# Patient Record
Sex: Female | Born: 1940
Health system: Southern US, Community
[De-identification: ages and names within clinical notes are randomized; demographics above are authoritative.]

## PROBLEM LIST (undated history)

## (undated) DIAGNOSIS — S43006A Unspecified dislocation of unspecified shoulder joint, initial encounter: Secondary | ICD-10-CM

## (undated) DIAGNOSIS — I10 Essential (primary) hypertension: Secondary | ICD-10-CM

## (undated) DIAGNOSIS — M1990 Unspecified osteoarthritis, unspecified site: Secondary | ICD-10-CM

## (undated) DIAGNOSIS — R42 Dizziness and giddiness: Secondary | ICD-10-CM

## (undated) DIAGNOSIS — J189 Pneumonia, unspecified organism: Secondary | ICD-10-CM

## (undated) DIAGNOSIS — N289 Disorder of kidney and ureter, unspecified: Secondary | ICD-10-CM

## (undated) DIAGNOSIS — N200 Calculus of kidney: Secondary | ICD-10-CM

## (undated) DIAGNOSIS — M549 Dorsalgia, unspecified: Secondary | ICD-10-CM

## (undated) DIAGNOSIS — J449 Chronic obstructive pulmonary disease, unspecified: Secondary | ICD-10-CM

## (undated) DIAGNOSIS — I4891 Unspecified atrial fibrillation: Secondary | ICD-10-CM

## (undated) DIAGNOSIS — K862 Cyst of pancreas: Secondary | ICD-10-CM

## (undated) DIAGNOSIS — E119 Type 2 diabetes mellitus without complications: Secondary | ICD-10-CM

## (undated) HISTORY — PX: BREAST SURGERY: SHX581

## (undated) HISTORY — PX: ABDOMINAL HYSTERECTOMY: SHX81

## (undated) HISTORY — DX: Calculus of kidney: N20.0

## (undated) HISTORY — PX: FOOT SURGERY: SHX648

## (undated) HISTORY — PX: BLADDER SURGERY: SHX569

---

## 2007-09-16 ENCOUNTER — Ambulatory Visit (HOSPITAL_COMMUNITY): Admission: RE | Admit: 2007-09-16 | Discharge: 2007-09-16 | Payer: Self-pay | Admitting: Internal Medicine

## 2007-09-26 ENCOUNTER — Ambulatory Visit (HOSPITAL_COMMUNITY): Admission: RE | Admit: 2007-09-26 | Discharge: 2007-09-26 | Payer: Self-pay | Admitting: Internal Medicine

## 2007-10-30 ENCOUNTER — Ambulatory Visit (HOSPITAL_COMMUNITY): Admission: RE | Admit: 2007-10-30 | Discharge: 2007-10-30 | Payer: Self-pay | Admitting: Nephrology

## 2007-12-25 ENCOUNTER — Ambulatory Visit (HOSPITAL_COMMUNITY): Admission: RE | Admit: 2007-12-25 | Discharge: 2007-12-25 | Payer: Self-pay | Admitting: Family Medicine

## 2007-12-30 ENCOUNTER — Ambulatory Visit (HOSPITAL_COMMUNITY): Admission: RE | Admit: 2007-12-30 | Discharge: 2007-12-30 | Payer: Self-pay | Admitting: Family Medicine

## 2009-05-31 ENCOUNTER — Ambulatory Visit (HOSPITAL_COMMUNITY): Admission: RE | Admit: 2009-05-31 | Discharge: 2009-05-31 | Payer: Self-pay | Admitting: Family Medicine

## 2010-11-30 ENCOUNTER — Ambulatory Visit (INDEPENDENT_AMBULATORY_CARE_PROVIDER_SITE_OTHER): Payer: Medicare Other | Admitting: Internal Medicine

## 2010-11-30 DIAGNOSIS — R197 Diarrhea, unspecified: Secondary | ICD-10-CM

## 2011-06-19 ENCOUNTER — Other Ambulatory Visit (HOSPITAL_COMMUNITY): Payer: Self-pay | Admitting: Internal Medicine

## 2011-06-19 DIAGNOSIS — Z139 Encounter for screening, unspecified: Secondary | ICD-10-CM

## 2011-06-22 ENCOUNTER — Other Ambulatory Visit (HOSPITAL_COMMUNITY): Payer: Medicare Other

## 2011-06-27 ENCOUNTER — Other Ambulatory Visit (HOSPITAL_COMMUNITY): Payer: Medicare Other

## 2011-07-07 ENCOUNTER — Ambulatory Visit (HOSPITAL_COMMUNITY)
Admission: RE | Admit: 2011-07-07 | Discharge: 2011-07-07 | Disposition: A | Discharge status: Home / Self Care | Payer: Medicare Other | Source: Ambulatory Visit | Attending: Internal Medicine | Admitting: Internal Medicine

## 2011-07-07 DIAGNOSIS — Z139 Encounter for screening, unspecified: Secondary | ICD-10-CM

## 2011-07-07 DIAGNOSIS — M899 Disorder of bone, unspecified: Secondary | ICD-10-CM | POA: Insufficient documentation

## 2011-07-07 DIAGNOSIS — M949 Disorder of cartilage, unspecified: Secondary | ICD-10-CM | POA: Insufficient documentation

## 2011-07-07 DIAGNOSIS — Z780 Asymptomatic menopausal state: Secondary | ICD-10-CM | POA: Insufficient documentation

## 2011-07-07 DIAGNOSIS — Z13820 Encounter for screening for osteoporosis: Secondary | ICD-10-CM | POA: Insufficient documentation

## 2011-08-28 ENCOUNTER — Emergency Department (HOSPITAL_COMMUNITY): Payer: Medicare Other

## 2011-08-28 ENCOUNTER — Emergency Department (HOSPITAL_COMMUNITY)
Admission: EM | Admit: 2011-08-28 | Discharge: 2011-08-28 | Disposition: A | Discharge status: Home / Self Care | Payer: Medicare Other | Attending: Emergency Medicine | Admitting: Emergency Medicine

## 2011-08-28 DIAGNOSIS — N200 Calculus of kidney: Secondary | ICD-10-CM | POA: Insufficient documentation

## 2011-08-28 DIAGNOSIS — J45909 Unspecified asthma, uncomplicated: Secondary | ICD-10-CM | POA: Insufficient documentation

## 2011-08-28 DIAGNOSIS — D72829 Elevated white blood cell count, unspecified: Secondary | ICD-10-CM | POA: Insufficient documentation

## 2011-08-28 DIAGNOSIS — M109 Gout, unspecified: Secondary | ICD-10-CM | POA: Insufficient documentation

## 2011-08-28 DIAGNOSIS — M549 Dorsalgia, unspecified: Secondary | ICD-10-CM

## 2011-08-28 DIAGNOSIS — R110 Nausea: Secondary | ICD-10-CM | POA: Insufficient documentation

## 2011-08-28 DIAGNOSIS — N289 Disorder of kidney and ureter, unspecified: Secondary | ICD-10-CM | POA: Insufficient documentation

## 2011-08-28 DIAGNOSIS — I10 Essential (primary) hypertension: Secondary | ICD-10-CM | POA: Insufficient documentation

## 2011-08-28 DIAGNOSIS — Z9079 Acquired absence of other genital organ(s): Secondary | ICD-10-CM | POA: Insufficient documentation

## 2011-08-28 DIAGNOSIS — M545 Low back pain, unspecified: Secondary | ICD-10-CM | POA: Insufficient documentation

## 2011-08-28 DIAGNOSIS — E119 Type 2 diabetes mellitus without complications: Secondary | ICD-10-CM | POA: Insufficient documentation

## 2011-08-28 DIAGNOSIS — R109 Unspecified abdominal pain: Secondary | ICD-10-CM | POA: Insufficient documentation

## 2011-08-28 HISTORY — DX: Essential (primary) hypertension: I10

## 2011-08-28 HISTORY — DX: Disorder of kidney and ureter, unspecified: N28.9

## 2011-08-28 LAB — DIFFERENTIAL
Basophils Absolute: 0 K/uL (ref 0.0–0.1)
Basophils Relative: 0 % (ref 0–1)
Eosinophils Absolute: 0.3 10*3/uL (ref 0.0–0.7)
Eosinophils Relative: 3 % (ref 0–5)
Lymphocytes Relative: 25 % (ref 12–46)
Lymphs Abs: 2.4 K/uL (ref 0.7–4.0)
Monocytes Absolute: 0.9 K/uL (ref 0.1–1.0)
Monocytes Relative: 9 % (ref 3–12)
Neutro Abs: 6.4 10*3/uL (ref 1.7–7.7)
Neutrophils Relative %: 63 % (ref 43–77)

## 2011-08-28 LAB — URINALYSIS, ROUTINE W REFLEX MICROSCOPIC
Bilirubin Urine: NEGATIVE
Hgb urine dipstick: NEGATIVE
Nitrite: NEGATIVE
Protein, ur: NEGATIVE mg/dL
Specific Gravity, Urine: 1.02 (ref 1.005–1.030)
Urobilinogen, UA: 0.2 mg/dL (ref 0.0–1.0)

## 2011-08-28 LAB — COMPREHENSIVE METABOLIC PANEL
AST: 20 U/L (ref 0–37)
CO2: 29 mEq/L (ref 19–32)
Chloride: 102 mEq/L (ref 96–112)
GFR calc non Af Amer: 37 mL/min — ABNORMAL LOW (ref >90)
Glucose, Bld: 74 mg/dL (ref 70–99)
Sodium: 140 mEq/L (ref 135–145)
Total Protein: 7.3 g/dL (ref 6.0–8.3)

## 2011-08-28 LAB — CBC
HCT: 37.4 % (ref 36.0–46.0)
Hemoglobin: 12.4 g/dL (ref 12.0–15.0)
MCH: 32.2 pg (ref 26.0–34.0)
MCHC: 33.2 g/dL (ref 30.0–36.0)
MCV: 97.1 fL (ref 78.0–100.0)
Platelets: 242 10*3/uL (ref 150–400)
RBC: 3.85 MIL/uL — ABNORMAL LOW (ref 3.87–5.11)
RDW: 14.2 % (ref 11.5–15.5)
WBC: 10 K/uL (ref 4.0–10.5)

## 2011-08-28 LAB — COMPREHENSIVE METABOLIC PANEL WITH GFR
ALT: 13 U/L (ref 0–35)
Albumin: 4 g/dL (ref 3.5–5.2)
Alkaline Phosphatase: 77 U/L (ref 39–117)
BUN: 23 mg/dL (ref 6–23)
Calcium: 10.4 mg/dL (ref 8.4–10.5)
Creatinine, Ser: 1.4 mg/dL — ABNORMAL HIGH (ref 0.50–1.10)
GFR calc Af Amer: 42 mL/min — ABNORMAL LOW (ref >90)
Potassium: 4.2 meq/L (ref 3.5–5.1)
Total Bilirubin: 0.5 mg/dL (ref 0.3–1.2)

## 2011-08-28 LAB — LIPASE, BLOOD: Lipase: 35 U/L (ref 11–59)

## 2011-08-28 MED ORDER — FENTANYL CITRATE 0.05 MG/ML IJ SOLN
25.0000 ug | Freq: Once | INTRAMUSCULAR | Status: AC
  Administered 2011-08-28: 25 ug via INTRAVENOUS
  Filled 2011-08-28: qty 2

## 2011-08-28 MED ORDER — ONDANSETRON HCL 4 MG/2ML IJ SOLN
4.0000 mg | Freq: Once | INTRAMUSCULAR | Status: AC
  Administered 2011-08-28: 4 mg via INTRAVENOUS
  Filled 2011-08-28: qty 2

## 2011-08-28 MED ORDER — ONDANSETRON HCL 4 MG/2ML IJ SOLN: 4.0000 mg | Freq: Once | INTRAMUSCULAR | Status: DC

## 2011-08-28 MED ORDER — ONDANSETRON HCL 4 MG/2ML IJ SOLN
INTRAMUSCULAR | Status: AC
  Filled 2011-08-28: qty 2

## 2011-08-28 MED ORDER — SODIUM CHLORIDE 0.9 % IV SOLN
Freq: Once | INTRAVENOUS | Status: AC
  Administered 2011-08-28: 125 mL via INTRAVENOUS

## 2011-08-28 NOTE — Discharge Instructions (Signed)
Back Pain, Adult Low back pain is very common. About 1 in 5 people have back pain.The cause of low back pain is rarely dangerous. The pain often gets better over time.About half of people with a sudden onset of back pain feel better in just 2 weeks. About 8 in 10 people feel better by 6 weeks.  CAUSES Some common causes of back pain include:  Strain of the muscles or ligaments supporting the spine.   Wear and tear (degeneration) of the spinal discs.   Arthritis.   Direct injury to the back.  DIAGNOSIS Most of the time, the direct cause of low back pain is not known.However, back pain can be treated effectively even when the exact cause of the pain is unknown.Answering your caregiver's questions about your overall health and symptoms is one of the most accurate ways to make sure the cause of your pain is not dangerous. If your caregiver needs more information, he or she may order lab work or imaging tests (X-rays or MRIs).However, even if imaging tests show changes in your back, this usually does not require surgery. HOME CARE INSTRUCTIONS For many people, back pain returns.Since low back pain is rarely dangerous, it is often a condition that people can learn to manageon their own.   Remain active. It is stressful on the back to sit or stand in one place. Do not sit, drive, or stand in one place for more than 30 minutes at a time. Take short walks on level surfaces as soon as pain allows.Try to increase the length of time you walk each day.   Do not stay in bed.Resting more than 1 or 2 days can delay your recovery.   Do not avoid exercise or work.Your body is made to move.It is not dangerous to be active, even though your back may hurt.Your back will likely heal faster if you return to being active before your pain is gone.   Pay attention to your body when you bend and lift. Many people have less discomfortwhen lifting if they bend their knees, keep the load close to their  bodies,and avoid twisting. Often, the most comfortable positions are those that put less stress on your recovering back.   Find a comfortable position to sleep. Use a firm mattress and lie on your side with your knees slightly bent. If you lie on your back, put a pillow under your knees.   Only take over-the-counter or prescription medicines as directed by your caregiver. Over-the-counter medicines to reduce pain and inflammation are often the most helpful.Your caregiver may prescribe muscle relaxant drugs.These medicines help dull your pain so you can more quickly return to your normal activities and healthy exercise.   Put ice on the injured area.   Put ice in a plastic bag.   Place a towel between your skin and the bag.   Leave the ice on for 15 to 20 minutes, 3 to 4 times a day for the first 2 to 3 days. After that, ice and heat may be alternated to reduce pain and spasms.   Ask your caregiver about trying back exercises and gentle massage. This may be of some benefit.   Avoid feeling anxious or stressed.Stress increases muscle tension and can worsen back pain.It is important to recognize when you are anxious or stressed and learn ways to manage it.Exercise is a great option.  SEEK MEDICAL CARE IF:  You have pain that is not relieved with rest or medicine.   You have   pain that does not improve in 1 week.   You have new symptoms.   You are generally not feeling well.  SEEK IMMEDIATE MEDICAL CARE IF:   You have pain that radiates from your back into your legs.   You develop new bowel or bladder control problems.   You have unusual weakness or numbness in your arms or legs.   You develop nausea or vomiting.   You develop abdominal pain.   You feel faint.  Document Released: 08/14/2005 Document Revised: 04/26/2011 Document Reviewed: 01/02/2011 [employer] Patient Information 2012 [employer]. 

## 2011-08-28 NOTE — ED Provider Notes (Signed)
History     CSN: [id number]  Arrival date & time 08/28/11  1228   First MD Initiated Contact with Patient 08/28/11 1505      Chief Complaint  Patient presents with  . Flank Pain    (Consider location/radiation/quality/duration/timing/severity/associated sxs/prior treatment) HPI Comments: Patient reports that she's had some intermittent right flank and low back pain intermittently for approximately one to 2 months. Patient reports that she had an exacerbation of this pain starting yesterday. She does not recall injuring her back, following, picking up anything heavy. She denies any nausea or vomiting. She reports she's had kidney infections as well as kidney stones in the past but this pain is somewhat different. With her kidney stone she had nausea and vomiting and she does not now. With kidney infections, she often had frequency as well as dysuria and she also does not have any of those symptoms. She reports sometimes moving or bending and to certain positions exacerbates her pain. She denies cough and reports no pleuritic pain. She denies fever or chills also denies any rash or itching in that area. She denies numbness or weakness going down her right leg. Denies difficulty with urination or defecation.  Patient is a 70 y.o. female presenting with flank pain. The history is provided by the patient and a relative.  Flank Pain Pertinent negatives include no chest pain and no shortness of breath.    Past Medical History  Diagnosis Date  . Renal disorder   . Gout   . Hypertension   . Asthma   . Diabetes mellitus     Past Surgical History  Procedure Date  . Breast surgery   . Abdominal hysterectomy   . Bladder surgery   . Foot surgery     No family history on file.  History  Substance Use Topics  . Smoking status: Never Smoker   . Smokeless tobacco: Not on file  . Alcohol Use: No    OB History    Grav Para Term Preterm Abortions TAB SAB Ect Mult Living                    Review of Systems  Respiratory: Negative for cough and shortness of breath.   Cardiovascular: Negative for chest pain.  Gastrointestinal: Negative for nausea, vomiting, diarrhea and constipation.  Genitourinary: Positive for flank pain. Negative for dysuria, urgency and hematuria.  Musculoskeletal: Positive for back pain.  Skin: Negative for rash.  Neurological: Negative for weakness and numbness.  All other systems reviewed and are negative.    Allergies  Codeine  Home Medications   Current Outpatient Rx  Name Route Sig Dispense Refill  . ACETAMINOPHEN 500 MG PO TABS Oral Take 500 mg by mouth as needed. For pain     . ALBUTEROL SULFATE HFA 108 (90 BASE) MCG/ACT IN AERS Inhalation Inhale 2 puffs into the lungs daily as needed. For rescue     . ALENDRONATE SODIUM 70 MG PO TABS Oral Take 70 mg by mouth every 7 (seven) days. Take with a full glass of water on an empty stomach on Wednesdays     . ALLOPURINOL 100 MG PO TABS Oral Take 200 mg by mouth every morning.      Marland Kitchen AMLODIPINE BESYLATE 10 MG PO TABS Oral Take 5 mg by mouth every morning.      . BUDESONIDE-FORMOTEROL FUMARATE 80-4.5 MCG/ACT IN AERO Inhalation Inhale 2 puffs into the lungs 2 (two) times daily.      Marland Kitchen  CALCIUM 600 + D PO Oral Take 1 tablet by mouth daily.      Marland Kitchen VITAMIN D 2000 UNITS PO CAPS Oral Take 1 capsule by mouth every morning.      Marland Kitchen GLIMEPIRIDE 2 MG PO TABS Oral Take 2 mg by mouth every morning.      Marland Kitchen LOPERAMIDE HCL 2 MG PO CAPS Oral Take 2 mg by mouth every morning.      Marland Kitchen METOPROLOL TARTRATE 50 MG PO TABS Oral Take 50 mg by mouth daily.      Marland Kitchen SIMVASTATIN 10 MG PO TABS Oral Take 10 mg by mouth at bedtime.        BP 148/77  Pulse 78  Temp(Src) 97.6 F (36.4 C) (Oral)  Resp 20  Ht 4\' 11"  (1.499 m)  Wt 186 lb (84.369 kg)  BMI 37.57 kg/m2  SpO2 97%  Physical Exam  Nursing note and vitals reviewed. Constitutional: She appears well-developed and well-nourished.  HENT:  Head: Normocephalic and  atraumatic.  Eyes: Pupils are equal, round, and reactive to light. No scleral icterus.  Neck: Normal range of motion. Neck supple.  Pulmonary/Chest: Effort normal.  Abdominal: Soft. Bowel sounds are normal. She exhibits no distension. There is no tenderness. There is no rebound, no guarding and no CVA tenderness.  Musculoskeletal:       Arms: Neurological: She is alert.  Skin: Skin is warm and dry. No rash noted.    ED Course  Procedures (including critical care time)  Labs Reviewed  GLUCOSE, CAPILLARY - Abnormal; Notable for the following:    Glucose-Capillary 129 (*)    All other components within normal limits  CBC - Abnormal; Notable for the following:    RBC 3.85 (*)    All other components within normal limits  COMPREHENSIVE METABOLIC PANEL - Abnormal; Notable for the following:    Creatinine, Ser 1.40 (*)    GFR calc non Af Amer 37 (*)    GFR calc Af Amer 42 (*)    All other components within normal limits  URINALYSIS, ROUTINE W REFLEX MICROSCOPIC  DIFFERENTIAL  LIPASE, BLOOD   Ct Abdomen Pelvis Wo Contrast  08/28/2011  *RADIOLOGY REPORT*  Clinical Data: Left flank pain, history of stones, status post hysterectomy  CT ABDOMEN AND PELVIS WITHOUT CONTRAST  Technique:  Multidetector CT imaging of the abdomen and pelvis was performed following the standard protocol without intravenous contrast.  Comparison: None.  Findings: Mild linear scarring at the lung bases.  Unenhanced liver, pancreas, and adrenal glands are within normal limits.  3.1 x 2.2 cm fluid density lesion in the spleen (series 2/image 12), likely benign.  Gallbladder is unremarkable.  No intrahepatic or extrahepatic ductal dilatation.  Multiple bilateral nonobstructing renal calculi measuring up to 9 mm on the right (series 2/image 26) and 2 mm on the left (series 2/image 27).  Renal sinus cysts.  No hydronephrosis.  No evidence of bowel obstruction.  Normal appendix.  Colonic diverticulosis, without associate  inflammatory changes.  Atherosclerotic calcifications of the abdominal aorta and branch vessels.  No abdominopelvic ascites.  No suspicious abdominopelvic lymphadenopathy.  Status post hysterectomy.  No adnexal masses.  No ureteral or bladder calculi.  Tiny hiatal hernia.  Tiny fat-containing umbilical hernia.  Degenerative changes of the visualized thoracolumbar spine.  IMPRESSION: Multiple bilateral nonobstructing renal calculi measuring up to 9 mm on the right and 2 mm on the left.  No hydronephrosis.  No ureteral or bladder calculi.  Normal appendix.  No evidence  of bowel obstruction.  Colonic diverticulosis, without associated inflammatory changes.  Original Report Authenticated By: Julian Hy, M.D.     1. Back pain       MDM  WBC is not elevated, lytes are ok.  Lipase is neg.  UA shows no signs of infection or blood.  Non contrast CT shows no acute abn's per the radiologist.  Non obstructive kidney stones noted, but I doubt is source of pain.  I favor now a musculoskeletal cause of pain.  Will d/c home and refer back to PCP.  Will prescribe some analgesics for home.      Pt developed N/V after fentanyl given.  Pt did reports most all narcotics caused N/V and vertigo type symptoms, I feel I caused her N/V.  Improved after rest and IV zofran.  Will d/c home.  Pt reports taking tylenol at home will be adequate.  She can follow up with [facility] next week as needed.  I informed pt of results of CT scan.      Saddie Benders. Dorna Mai, MD 08/28/11 [phone]

## 2011-08-28 NOTE — ED Notes (Signed)
Pt c/o r flank pain off and on for the past couple of months.  Pt says pain worse since yesterday.  Denies injury.  Denies urinary symptoms.  Reports has history of kidney stones and chronic kidney disease.

## 2011-08-28 NOTE — ED Notes (Signed)
Dr Dorna Mai in to see pt.

## 2011-08-28 NOTE — ED Notes (Signed)
Patient transported to CT 

## 2011-09-19 DIAGNOSIS — D518 Other vitamin B12 deficiency anemias: Secondary | ICD-10-CM | POA: Diagnosis not present

## 2011-09-25 DIAGNOSIS — I10 Essential (primary) hypertension: Secondary | ICD-10-CM | POA: Diagnosis not present

## 2011-09-25 DIAGNOSIS — N189 Chronic kidney disease, unspecified: Secondary | ICD-10-CM | POA: Diagnosis not present

## 2011-09-25 DIAGNOSIS — E119 Type 2 diabetes mellitus without complications: Secondary | ICD-10-CM | POA: Diagnosis not present

## 2011-09-25 DIAGNOSIS — G8929 Other chronic pain: Secondary | ICD-10-CM | POA: Diagnosis not present

## 2011-09-25 DIAGNOSIS — Z6835 Body mass index (BMI) 35.0-35.9, adult: Secondary | ICD-10-CM | POA: Diagnosis not present

## 2011-09-26 DIAGNOSIS — E119 Type 2 diabetes mellitus without complications: Secondary | ICD-10-CM | POA: Diagnosis not present

## 2011-09-26 DIAGNOSIS — I10 Essential (primary) hypertension: Secondary | ICD-10-CM | POA: Diagnosis not present

## 2011-10-16 DIAGNOSIS — D510 Vitamin B12 deficiency anemia due to intrinsic factor deficiency: Secondary | ICD-10-CM | POA: Diagnosis not present

## 2011-11-13 DIAGNOSIS — Z79899 Other long term (current) drug therapy: Secondary | ICD-10-CM | POA: Diagnosis not present

## 2011-11-13 DIAGNOSIS — E559 Vitamin D deficiency, unspecified: Secondary | ICD-10-CM | POA: Diagnosis not present

## 2011-11-13 DIAGNOSIS — R809 Proteinuria, unspecified: Secondary | ICD-10-CM | POA: Diagnosis not present

## 2011-11-13 DIAGNOSIS — I10 Essential (primary) hypertension: Secondary | ICD-10-CM | POA: Diagnosis not present

## 2011-11-13 DIAGNOSIS — N189 Chronic kidney disease, unspecified: Secondary | ICD-10-CM | POA: Diagnosis not present

## 2011-11-13 DIAGNOSIS — D649 Anemia, unspecified: Secondary | ICD-10-CM | POA: Diagnosis not present

## 2011-11-15 DIAGNOSIS — D649 Anemia, unspecified: Secondary | ICD-10-CM | POA: Diagnosis not present

## 2011-11-15 DIAGNOSIS — E1165 Type 2 diabetes mellitus with hyperglycemia: Secondary | ICD-10-CM | POA: Diagnosis not present

## 2011-11-15 DIAGNOSIS — I10 Essential (primary) hypertension: Secondary | ICD-10-CM | POA: Diagnosis not present

## 2011-11-15 DIAGNOSIS — E1129 Type 2 diabetes mellitus with other diabetic kidney complication: Secondary | ICD-10-CM | POA: Diagnosis not present

## 2011-11-16 DIAGNOSIS — D510 Vitamin B12 deficiency anemia due to intrinsic factor deficiency: Secondary | ICD-10-CM | POA: Diagnosis not present

## 2011-11-20 DIAGNOSIS — H35039 Hypertensive retinopathy, unspecified eye: Secondary | ICD-10-CM | POA: Diagnosis not present

## 2011-11-20 DIAGNOSIS — H524 Presbyopia: Secondary | ICD-10-CM | POA: Diagnosis not present

## 2011-11-20 DIAGNOSIS — H5200 Hypermetropia, unspecified eye: Secondary | ICD-10-CM | POA: Diagnosis not present

## 2011-11-20 DIAGNOSIS — H52229 Regular astigmatism, unspecified eye: Secondary | ICD-10-CM | POA: Diagnosis not present

## 2011-12-12 DIAGNOSIS — M25519 Pain in unspecified shoulder: Secondary | ICD-10-CM | POA: Diagnosis not present

## 2011-12-12 DIAGNOSIS — D510 Vitamin B12 deficiency anemia due to intrinsic factor deficiency: Secondary | ICD-10-CM | POA: Diagnosis not present

## 2011-12-12 DIAGNOSIS — E119 Type 2 diabetes mellitus without complications: Secondary | ICD-10-CM | POA: Diagnosis not present

## 2011-12-12 DIAGNOSIS — G8929 Other chronic pain: Secondary | ICD-10-CM | POA: Diagnosis not present

## 2011-12-12 DIAGNOSIS — E785 Hyperlipidemia, unspecified: Secondary | ICD-10-CM | POA: Diagnosis not present

## 2012-01-15 DIAGNOSIS — D510 Vitamin B12 deficiency anemia due to intrinsic factor deficiency: Secondary | ICD-10-CM | POA: Diagnosis not present

## 2012-01-29 DIAGNOSIS — J45901 Unspecified asthma with (acute) exacerbation: Secondary | ICD-10-CM | POA: Diagnosis not present

## 2012-01-29 DIAGNOSIS — J069 Acute upper respiratory infection, unspecified: Secondary | ICD-10-CM | POA: Diagnosis not present

## 2012-01-29 DIAGNOSIS — Z6834 Body mass index (BMI) 34.0-34.9, adult: Secondary | ICD-10-CM | POA: Diagnosis not present

## 2012-02-05 ENCOUNTER — Emergency Department (HOSPITAL_COMMUNITY): Payer: Medicare Other

## 2012-02-05 ENCOUNTER — Emergency Department (HOSPITAL_COMMUNITY)
Admission: EM | Admit: 2012-02-05 | Discharge: 2012-02-05 | Disposition: A | Discharge status: Home / Self Care | Payer: Medicare Other | Attending: Emergency Medicine | Admitting: Emergency Medicine

## 2012-02-05 ENCOUNTER — Encounter (HOSPITAL_COMMUNITY): Payer: Self-pay

## 2012-02-05 DIAGNOSIS — N289 Disorder of kidney and ureter, unspecified: Secondary | ICD-10-CM | POA: Diagnosis not present

## 2012-02-05 DIAGNOSIS — R059 Cough, unspecified: Secondary | ICD-10-CM | POA: Diagnosis not present

## 2012-02-05 DIAGNOSIS — E119 Type 2 diabetes mellitus without complications: Secondary | ICD-10-CM | POA: Insufficient documentation

## 2012-02-05 DIAGNOSIS — J45909 Unspecified asthma, uncomplicated: Secondary | ICD-10-CM | POA: Diagnosis not present

## 2012-02-05 DIAGNOSIS — I10 Essential (primary) hypertension: Secondary | ICD-10-CM | POA: Diagnosis not present

## 2012-02-05 DIAGNOSIS — R0789 Other chest pain: Secondary | ICD-10-CM | POA: Diagnosis not present

## 2012-02-05 DIAGNOSIS — R05 Cough: Secondary | ICD-10-CM | POA: Insufficient documentation

## 2012-02-05 MED ORDER — PREDNISONE 10 MG PO TABS: 20.0000 mg | ORAL_TABLET | Freq: Every day | ORAL | Status: DC

## 2012-02-05 MED ORDER — ALBUTEROL SULFATE (5 MG/ML) 0.5% IN NEBU
5.0000 mg | INHALATION_SOLUTION | Freq: Once | RESPIRATORY_TRACT | Status: AC
  Administered 2012-02-05: 5 mg via RESPIRATORY_TRACT
  Filled 2012-02-05: qty 1

## 2012-02-05 MED ORDER — PREDNISONE 20 MG PO TABS
60.0000 mg | ORAL_TABLET | Freq: Once | ORAL | Status: AC
  Administered 2012-02-05: 60 mg via ORAL
  Filled 2012-02-05: qty 3

## 2012-02-05 MED ORDER — AZITHROMYCIN 250 MG PO TABS: ORAL_TABLET | ORAL | Status: AC

## 2012-02-05 MED ORDER — IPRATROPIUM BROMIDE 0.02 % IN SOLN
0.5000 mg | Freq: Once | RESPIRATORY_TRACT | Status: AC
  Administered 2012-02-05: 0.5 mg via RESPIRATORY_TRACT
  Filled 2012-02-05: qty 2.5

## 2012-02-05 MED ORDER — ALBUTEROL SULFATE (2.5 MG/3ML) 0.083% IN NEBU: 2.5000 mg | INHALATION_SOLUTION | RESPIRATORY_TRACT | Status: DC | PRN

## 2012-02-05 NOTE — ED Notes (Signed)
C/o cough, congestion for a week, was seen by pcp on Monday a week ago, was given steroid injection on Monday while at pcp office, pt states that she is not any better. Dr. Zenia Resides at bedside for evaluation,

## 2012-02-05 NOTE — Discharge Instructions (Signed)
Use your nebulizer at home every 4 hours for the next 2 days. Return here for any trouble breathing Asthma, Adult Asthma is caused by narrowing of the air passages in the lungs. It may be triggered by pollen, dust, animal dander, molds, some foods, respiratory infections, exposure to smoke, exercise, emotional stress or other allergens (things that cause allergic reactions or allergies). Repeat attacks are common. HOME CARE INSTRUCTIONS   Use prescription medications as ordered by your caregiver.   Avoid pollen, dust, animal dander, molds, smoke and other things that cause attacks at home and at work.   You may have fewer attacks if you decrease dust in your home. Electrostatic air cleaners may help.   It may help to replace your pillows or mattress with materials less likely to cause allergies.   Talk to your caregiver about an action plan for managing asthma attacks at home, including, the use of a peak flow meter which measures the severity of your asthma attack. An action plan can help minimize or stop the attack without having to seek medical care.   If you are not on a fluid restriction, drink 8 to 10 glasses of water each day.   Always have a plan prepared for seeking medical attention, including, calling your physician, accessing local emergency care, and calling 911 (in the [place] for a severe attack.   Discuss possible exercise routines with your caregiver.   If animal dander is the cause of asthma, you may need to get rid of pets.  SEEK MEDICAL CARE IF:   You have wheezing and shortness of breath even if taking medicine to prevent attacks.   You have muscle aches, chest pain or thickening of sputum.   Your sputum changes from clear or white to yellow, green, gray, or bloody.   You have any problems that may be related to the medicine you are taking (such as a rash, itching, swelling or trouble breathing).  SEEK IMMEDIATE MEDICAL CARE IF:   Your usual medicines do not stop  your wheezing or there is increased coughing and/or shortness of breath.   You have increased difficulty breathing.   You have a fever.  MAKE SURE YOU:   Understand these instructions.   Will watch your condition.   Will get help right away if you are not doing well or get worse.  Document Released: 08/14/2005 Document Revised: 08/03/2011 Document Reviewed: 04/01/2008 [facility] Patient Information 2012 [employer].

## 2012-02-05 NOTE — ED Notes (Signed)
Pt c/o productive cough with green sputum x 1 week.  Reports saw PCP last Monday and was given a steroid shot.  Also has taken some OTC cough medication but not any better.

## 2012-02-05 NOTE — ED Provider Notes (Signed)
History  This chart was scribed for Leota Jacobsen, MD by Jenne Campus. This patient was seen in room APA05/APA05 and the patient's care was started at 11:05AM.  CSN: [id number]  Arrival date & time 02/05/12  1049   First MD Initiated Contact with Patient 02/05/12 1105      Chief Complaint  Patient presents with  . URI    The history is provided by the patient. No language interpreter was used.    Lindsay Cortez is a 71 y.o. female with a h/o asthma who presents to the Emergency Department complaining of one week of gradual onset, gradually worsening, constant productive cough of green sputum with associated chest tightness. Saw PCP's nurse practioner with the onset of the symptoms and was given a shot of steroids with no improvement in her symptoms. She also reports taking OTC cough medications with no improvement in her symptoms. She states that she called her PCP's office today and was told that they didn't have room to schedule her in, so she decided to come to this ED for evaluation. She denies fevers and emesis as associated symptoms. She also has a h/o renal disorder, gout, HTN and DM. She denies smoking but is exposed to secondhand smoke and alcohol use.  Dr. Gerarda Fraction is PCP.  Past Medical History  Diagnosis Date  . Renal disorder   . Gout   . Hypertension   . Asthma   . Diabetes mellitus     Past Surgical History  Procedure Date  . Breast surgery   . Abdominal hysterectomy   . Bladder surgery   . Foot surgery     No family history on file.  History  Substance Use Topics  . Smoking status: Never Smoker   . Smokeless tobacco: Not on file  . Alcohol Use: No     Review of Systems  Constitutional: Negative for fever.       10 Systems reviewed and are negative for acute change except as noted in the HPI.  HENT: Negative for rhinorrhea.   Eyes: Negative for visual disturbance.  Respiratory: Positive for cough and chest tightness. Negative for shortness of  breath.   Cardiovascular: Negative for chest pain.  Gastrointestinal: Negative for vomiting, abdominal pain and diarrhea.  Genitourinary: Negative for dysuria.  Musculoskeletal: Negative for back pain.  Skin: Negative for rash.  Neurological: Negative for weakness and headaches.    Allergies  Fentanyl and Codeine  Home Medications   Current Outpatient Rx  Name Route Sig Dispense Refill  . ACETAMINOPHEN 500 MG PO TABS Oral Take 500 mg by mouth as needed. For pain     . ALBUTEROL SULFATE HFA 108 (90 BASE) MCG/ACT IN AERS Inhalation Inhale 2 puffs into the lungs daily as needed. For rescue     . ALENDRONATE SODIUM 70 MG PO TABS Oral Take 70 mg by mouth every 7 (seven) days. Take with a full glass of water on an empty stomach on Wednesdays     . ALLOPURINOL 100 MG PO TABS Oral Take 200 mg by mouth every morning.      Marland Kitchen AMLODIPINE BESYLATE 10 MG PO TABS Oral Take 5 mg by mouth every morning.      . BUDESONIDE-FORMOTEROL FUMARATE 80-4.5 MCG/ACT IN AERO Inhalation Inhale 2 puffs into the lungs 2 (two) times daily.      Marland Kitchen CALCIUM 600 + D PO Oral Take 1 tablet by mouth daily.      Marland Kitchen VITAMIN D 2000 UNITS PO  CAPS Oral Take 1 capsule by mouth every morning.      Marland Kitchen GLIMEPIRIDE 2 MG PO TABS Oral Take 2 mg by mouth every morning.      Marland Kitchen LOPERAMIDE HCL 2 MG PO CAPS Oral Take 2 mg by mouth every morning.      Marland Kitchen METOPROLOL TARTRATE 50 MG PO TABS Oral Take 50 mg by mouth daily.      Marland Kitchen SIMVASTATIN 10 MG PO TABS Oral Take 10 mg by mouth at bedtime.        Triage Vitals: BP 135/70  Pulse 70  Temp(Src) 97.1 F (36.2 C) (Oral)  Resp 20  Ht 4\' 11"  (1.499 m)  Wt 184 lb (83.462 kg)  BMI 37.16 kg/m2  SpO2 97%  Physical Exam  Pulmonary/Chest: She has wheezes (diffuse wheezing throughout).    ED Course  Procedures (including critical care time)  DIAGNOSTIC STUDIES: Oxygen Saturation is 97% on room air, adequate by my interpretation.    COORDINATION OF CARE: 11:!2AM-Discussed treatment plan of  chest x-ray and breathing treatment with pt and pt agreed to plan.   Labs Reviewed - No data to display Dg Chest 2 View  02/05/2012  *RADIOLOGY REPORT*  Clinical Data: Cough for 1 week.  Hypertension.  Asthma.  CHEST - 2 VIEW  Comparison: None.  Findings: Bilateral basilar atelectasis is present.  Prominent costochondral calcification is present at the right apex. Cardiopericardial silhouette appears within normal limits. Aortic arch atherosclerosis.  Thoracic spine DISH.  IMPRESSION: Basilar atelectasis and / or scarring.  No acute cardiopulmonary disease.  Original Report Authenticated By: Dereck Ligas, M.D.     No diagnosis found.    MDM  2:02 PM Patient given breathing treatment and prednisone he does flow better. Suspect that this is from her asthma. Chest x-ray negative. She'll be discharged home      I personally performed the services described in this documentation, which was scribed in my presence. The recorded information has been reviewed and considered.     Leota Jacobsen, MD 02/05/12 [phone]

## 2012-02-05 NOTE — ED Notes (Signed)
Pt states that she is breathing better after breathing tx, updated on delay given to pt and family, both expressed understanding.

## 2012-02-05 NOTE — ED Notes (Signed)
Pt resting with no complaints. States that she is coughing up a little sputum now.

## 2012-02-14 DIAGNOSIS — D518 Other vitamin B12 deficiency anemias: Secondary | ICD-10-CM | POA: Diagnosis not present

## 2012-02-19 DIAGNOSIS — N189 Chronic kidney disease, unspecified: Secondary | ICD-10-CM | POA: Diagnosis not present

## 2012-02-19 DIAGNOSIS — I10 Essential (primary) hypertension: Secondary | ICD-10-CM | POA: Diagnosis not present

## 2012-02-19 DIAGNOSIS — R809 Proteinuria, unspecified: Secondary | ICD-10-CM | POA: Diagnosis not present

## 2012-02-19 DIAGNOSIS — D649 Anemia, unspecified: Secondary | ICD-10-CM | POA: Diagnosis not present

## 2012-02-19 DIAGNOSIS — Z79899 Other long term (current) drug therapy: Secondary | ICD-10-CM | POA: Diagnosis not present

## 2012-02-21 DIAGNOSIS — E1129 Type 2 diabetes mellitus with other diabetic kidney complication: Secondary | ICD-10-CM | POA: Diagnosis not present

## 2012-02-21 DIAGNOSIS — I10 Essential (primary) hypertension: Secondary | ICD-10-CM | POA: Diagnosis not present

## 2012-03-05 DIAGNOSIS — Z6834 Body mass index (BMI) 34.0-34.9, adult: Secondary | ICD-10-CM | POA: Diagnosis not present

## 2012-03-05 DIAGNOSIS — I10 Essential (primary) hypertension: Secondary | ICD-10-CM | POA: Diagnosis not present

## 2012-03-05 DIAGNOSIS — E785 Hyperlipidemia, unspecified: Secondary | ICD-10-CM | POA: Diagnosis not present

## 2012-03-05 DIAGNOSIS — J309 Allergic rhinitis, unspecified: Secondary | ICD-10-CM | POA: Diagnosis not present

## 2012-03-05 DIAGNOSIS — E119 Type 2 diabetes mellitus without complications: Secondary | ICD-10-CM | POA: Diagnosis not present

## 2012-03-07 DIAGNOSIS — N189 Chronic kidney disease, unspecified: Secondary | ICD-10-CM | POA: Diagnosis not present

## 2012-03-07 DIAGNOSIS — Z79899 Other long term (current) drug therapy: Secondary | ICD-10-CM | POA: Diagnosis not present

## 2012-03-07 DIAGNOSIS — I10 Essential (primary) hypertension: Secondary | ICD-10-CM | POA: Diagnosis not present

## 2012-03-18 DIAGNOSIS — J309 Allergic rhinitis, unspecified: Secondary | ICD-10-CM | POA: Diagnosis not present

## 2012-03-18 DIAGNOSIS — E785 Hyperlipidemia, unspecified: Secondary | ICD-10-CM | POA: Diagnosis not present

## 2012-03-18 DIAGNOSIS — E119 Type 2 diabetes mellitus without complications: Secondary | ICD-10-CM | POA: Diagnosis not present

## 2012-03-18 DIAGNOSIS — I10 Essential (primary) hypertension: Secondary | ICD-10-CM | POA: Diagnosis not present

## 2012-03-18 DIAGNOSIS — D518 Other vitamin B12 deficiency anemias: Secondary | ICD-10-CM | POA: Diagnosis not present

## 2012-04-17 DIAGNOSIS — D518 Other vitamin B12 deficiency anemias: Secondary | ICD-10-CM | POA: Diagnosis not present

## 2012-04-26 DIAGNOSIS — Z1231 Encounter for screening mammogram for malignant neoplasm of breast: Secondary | ICD-10-CM | POA: Diagnosis not present

## 2012-05-17 DIAGNOSIS — D518 Other vitamin B12 deficiency anemias: Secondary | ICD-10-CM | POA: Diagnosis not present

## 2012-06-03 DIAGNOSIS — J45909 Unspecified asthma, uncomplicated: Secondary | ICD-10-CM | POA: Diagnosis not present

## 2012-06-03 DIAGNOSIS — E1149 Type 2 diabetes mellitus with other diabetic neurological complication: Secondary | ICD-10-CM | POA: Diagnosis not present

## 2012-06-03 DIAGNOSIS — G8929 Other chronic pain: Secondary | ICD-10-CM | POA: Diagnosis not present

## 2012-06-03 DIAGNOSIS — Z6835 Body mass index (BMI) 35.0-35.9, adult: Secondary | ICD-10-CM | POA: Diagnosis not present

## 2012-06-03 DIAGNOSIS — J042 Acute laryngotracheitis: Secondary | ICD-10-CM | POA: Diagnosis not present

## 2012-06-03 DIAGNOSIS — Z23 Encounter for immunization: Secondary | ICD-10-CM | POA: Diagnosis not present

## 2012-06-17 DIAGNOSIS — D510 Vitamin B12 deficiency anemia due to intrinsic factor deficiency: Secondary | ICD-10-CM | POA: Diagnosis not present

## 2012-06-17 DIAGNOSIS — Z79899 Other long term (current) drug therapy: Secondary | ICD-10-CM | POA: Diagnosis not present

## 2012-06-17 DIAGNOSIS — R809 Proteinuria, unspecified: Secondary | ICD-10-CM | POA: Diagnosis not present

## 2012-06-17 DIAGNOSIS — N189 Chronic kidney disease, unspecified: Secondary | ICD-10-CM | POA: Diagnosis not present

## 2012-06-20 ENCOUNTER — Encounter (HOSPITAL_COMMUNITY): Payer: Self-pay | Admitting: *Deleted

## 2012-06-20 ENCOUNTER — Emergency Department (HOSPITAL_COMMUNITY)
Admission: EM | Admit: 2012-06-20 | Discharge: 2012-06-20 | Disposition: A | Discharge status: Home / Self Care | Payer: Medicare Other | Attending: Emergency Medicine | Admitting: Emergency Medicine

## 2012-06-20 DIAGNOSIS — M109 Gout, unspecified: Secondary | ICD-10-CM | POA: Insufficient documentation

## 2012-06-20 DIAGNOSIS — M549 Dorsalgia, unspecified: Secondary | ICD-10-CM

## 2012-06-20 DIAGNOSIS — G8929 Other chronic pain: Secondary | ICD-10-CM | POA: Insufficient documentation

## 2012-06-20 DIAGNOSIS — I10 Essential (primary) hypertension: Secondary | ICD-10-CM | POA: Diagnosis not present

## 2012-06-20 DIAGNOSIS — E119 Type 2 diabetes mellitus without complications: Secondary | ICD-10-CM | POA: Insufficient documentation

## 2012-06-20 DIAGNOSIS — J45909 Unspecified asthma, uncomplicated: Secondary | ICD-10-CM | POA: Diagnosis not present

## 2012-06-20 DIAGNOSIS — Z79899 Other long term (current) drug therapy: Secondary | ICD-10-CM | POA: Diagnosis not present

## 2012-06-20 DIAGNOSIS — M545 Low back pain, unspecified: Secondary | ICD-10-CM | POA: Insufficient documentation

## 2012-06-20 HISTORY — DX: Dorsalgia, unspecified: M54.9

## 2012-06-20 MED ORDER — OXYCODONE-ACETAMINOPHEN 5-325 MG PO TABS: 1.0000 | ORAL_TABLET | ORAL | Status: DC | PRN

## 2012-06-20 MED ORDER — ONDANSETRON 8 MG PO TBDP: 8.0000 mg | ORAL_TABLET | Freq: Three times a day (TID) | ORAL | Status: DC | PRN

## 2012-06-20 NOTE — ED Provider Notes (Addendum)
History   This chart was scribed for [name], MD by Kathreen Cornfield. The patient was seen in room APA03/APA03 and the patient's care was started at 1:12PM.     CSN: [id number]  Arrival date & time 06/20/12  1158   First MD Initiated Contact with Patient 06/20/12 1312      Chief Complaint  Patient presents with  . Back Pain    (Consider location/radiation/quality/duration/timing/severity/associated sxs/prior treatment) Patient is a 71 y.o. female presenting with back pain. The history is provided by the patient. No language interpreter was used.  Back Pain  This is a new problem. The current episode started more than 2 days ago. The problem occurs constantly. The problem has been gradually worsening. The pain is associated with no known injury. The pain is present in the lumbar spine. The pain does not radiate. The pain is moderate. The symptoms are aggravated by certain positions. The pain is the same all the time. Pertinent negatives include no leg pain and no weakness.    Lindsay Cortez is a 71 y.o. female , with a hx of chronic lower back pain (bulging disks), who presents to the Emergency Department complaining of sudden, progressively worsening, back pain located at the right lower back, onset three days ago (06/17/12). The pt reports she has recently spent a large amount of time in the hospital with her husband, in the seated position, which may have agrrvated her back pain. Modifying factors include taking tylenol which provides moderate relief of back pain and certain movements and positions which intensify the back pain. The pt has a hx of epidural injection ( X 2, last injection was performed in 2011), diabetes, hypertension, and gout.   The pt denies weakness located within the lower extremities.   The pt does not smoke or drink alcohol.   PCP is Dr. Gerarda Fraction.    Past Medical History  Diagnosis Date  . Renal disorder   . Gout   . Hypertension   . Asthma   . Diabetes  mellitus   . Back pain     Past Surgical History  Procedure Date  . Breast surgery   . Abdominal hysterectomy   . Bladder surgery   . Foot surgery     No family history on file.  History  Substance Use Topics  . Smoking status: Never Smoker   . Smokeless tobacco: Not on file  . Alcohol Use: No    OB History    Grav Para Term Preterm Abortions TAB SAB Ect Mult Living                  Review of Systems  Musculoskeletal: Positive for back pain.  Neurological: Negative for weakness.  All other systems reviewed and are negative.    Allergies  Fentanyl and Codeine  Home Medications   Current Outpatient Rx  Name Route Sig Dispense Refill  . ACETAMINOPHEN 500 MG PO TABS Oral Take 500 mg by mouth as needed. For pain     . ALBUTEROL SULFATE (2.5 MG/3ML) 0.083% IN NEBU Nebulization Take 3 mLs (2.5 mg total) by nebulization every 4 (four) hours as needed for wheezing. 75 mL 12  . ALBUTEROL SULFATE HFA 108 (90 BASE) MCG/ACT IN AERS Inhalation Inhale 2 puffs into the lungs daily as needed. For rescue     . ALENDRONATE SODIUM 70 MG PO TABS Oral Take 70 mg by mouth every 7 (seven) days. Take with a full glass of water on an  empty stomach on Wednesdays    . ALLOPURINOL 100 MG PO TABS Oral Take 200 mg by mouth every morning.      Marland Kitchen AMLODIPINE BESYLATE 5 MG PO TABS Oral Take 5 mg by mouth daily.    . BUDESONIDE-FORMOTEROL FUMARATE 80-4.5 MCG/ACT IN AERO Inhalation Inhale 2 puffs into the lungs daily.     Marland Kitchen CALCIUM 600 + D PO Oral Take 1 tablet by mouth daily.      Marland Kitchen VITAMIN D 2000 UNITS PO CAPS Oral Take 1 capsule by mouth every morning.      Marland Kitchen GLIMEPIRIDE 2 MG PO TABS Oral Take 2 mg by mouth 2 (two) times daily.     Marland Kitchen LISINOPRIL 5 MG PO TABS Oral Take 5 mg by mouth daily.    Marland Kitchen LOPERAMIDE HCL 2 MG PO CAPS Oral Take 2 mg by mouth every morning.      Marland Kitchen METOPROLOL TARTRATE 50 MG PO TABS Oral Take 50 mg by mouth daily.      Marland Kitchen SIMVASTATIN 10 MG PO TABS Oral Take 10 mg by mouth at  bedtime.        BP 113/82  Pulse 70  Temp 97.9 F (36.6 C)  Resp 18  Ht 4\' 11"  (1.499 m)  Wt 182 lb (82.555 kg)  BMI 36.76 kg/m2  SpO2 100%  Physical Exam  Nursing note and vitals reviewed. Constitutional: She is oriented to person, place, and time. She appears well-developed and well-nourished. No distress.  HENT:  Head: Normocephalic and atraumatic.  Nose: Nose normal.  Eyes: Conjunctivae normal and EOM are normal.  Neck: Normal range of motion. Neck supple.  Cardiovascular: Normal rate, regular rhythm and normal heart sounds.   Pulmonary/Chest: Effort normal and breath sounds normal.  Abdominal: Soft. She exhibits no distension. There is no tenderness.  Musculoskeletal: Normal range of motion. She exhibits tenderness. She exhibits no edema.       Lumbar back: She exhibits tenderness.       Mild right perispinal lumbar tenderness detected. No lumbar step offs. 5/5 strength within the lower extremities.   Neurological: She is alert and oriented to person, place, and time.  Skin: Skin is warm and dry.  Psychiatric: She has a normal mood and affect. Her behavior is normal. Judgment normal.    ED Course  Procedures (including critical care time)  DIAGNOSTIC STUDIES: Oxygen Saturation is 100% on room air, normal by my interpretation.    COORDINATION OF CARE:   1:30 PM- Treatment plan concerning pain management discussed with patient. Pt agrees with treatment.      Labs Reviewed - No data to display No results found.   1. Chronic back pain       MDM  Normal lower extremity neurologic exam. No bowel or bladder complaints. No back pain red flags. Likely musculoskeletal back pain. Doubt spinal epidural abscess. Doubt cauda equina. Doubt abdominal aortic aneurysm  Close pcp follow up      I personally performed the services described in this documentation, which was scribed in my presence. The recorded information has been reviewed and considered.       [name], MD 06/20/12 [place], MD 06/20/12 [phone]

## 2012-06-20 NOTE — ED Notes (Signed)
Pt has hx of chronic lower back pain that became worse over the past two days, denies any injury, pt states that she had contacted her pcp to obtain a "shot" in her lower back but she needs to be seen for a visit prior to any injections, pt states that the pain is worse with movement, does radiate down left leg,

## 2012-06-25 DIAGNOSIS — G894 Chronic pain syndrome: Secondary | ICD-10-CM | POA: Diagnosis not present

## 2012-06-25 DIAGNOSIS — M545 Low back pain: Secondary | ICD-10-CM | POA: Diagnosis not present

## 2012-06-25 DIAGNOSIS — IMO0002 Reserved for concepts with insufficient information to code with codable children: Secondary | ICD-10-CM | POA: Diagnosis not present

## 2012-06-25 DIAGNOSIS — M5137 Other intervertebral disc degeneration, lumbosacral region: Secondary | ICD-10-CM | POA: Diagnosis not present

## 2012-06-25 DIAGNOSIS — M48062 Spinal stenosis, lumbar region with neurogenic claudication: Secondary | ICD-10-CM | POA: Diagnosis not present

## 2012-07-03 DIAGNOSIS — D649 Anemia, unspecified: Secondary | ICD-10-CM | POA: Diagnosis not present

## 2012-07-03 DIAGNOSIS — I10 Essential (primary) hypertension: Secondary | ICD-10-CM | POA: Diagnosis not present

## 2012-07-16 DIAGNOSIS — J45909 Unspecified asthma, uncomplicated: Secondary | ICD-10-CM | POA: Diagnosis not present

## 2012-07-16 DIAGNOSIS — E538 Deficiency of other specified B group vitamins: Secondary | ICD-10-CM | POA: Diagnosis not present

## 2012-07-16 DIAGNOSIS — J209 Acute bronchitis, unspecified: Secondary | ICD-10-CM | POA: Diagnosis not present

## 2012-08-05 DIAGNOSIS — IMO0002 Reserved for concepts with insufficient information to code with codable children: Secondary | ICD-10-CM | POA: Diagnosis not present

## 2012-08-05 DIAGNOSIS — M25529 Pain in unspecified elbow: Secondary | ICD-10-CM | POA: Diagnosis not present

## 2012-08-05 DIAGNOSIS — G894 Chronic pain syndrome: Secondary | ICD-10-CM | POA: Diagnosis not present

## 2012-08-05 DIAGNOSIS — M48061 Spinal stenosis, lumbar region without neurogenic claudication: Secondary | ICD-10-CM | POA: Diagnosis not present

## 2012-08-05 DIAGNOSIS — M5137 Other intervertebral disc degeneration, lumbosacral region: Secondary | ICD-10-CM | POA: Diagnosis not present

## 2012-08-05 DIAGNOSIS — M545 Low back pain: Secondary | ICD-10-CM | POA: Diagnosis not present

## 2012-08-06 DIAGNOSIS — M48061 Spinal stenosis, lumbar region without neurogenic claudication: Secondary | ICD-10-CM | POA: Diagnosis not present

## 2012-08-06 DIAGNOSIS — M5137 Other intervertebral disc degeneration, lumbosacral region: Secondary | ICD-10-CM | POA: Diagnosis not present

## 2012-08-06 DIAGNOSIS — G894 Chronic pain syndrome: Secondary | ICD-10-CM | POA: Diagnosis not present

## 2012-08-06 DIAGNOSIS — M545 Low back pain: Secondary | ICD-10-CM | POA: Diagnosis not present

## 2012-08-08 DIAGNOSIS — Z6834 Body mass index (BMI) 34.0-34.9, adult: Secondary | ICD-10-CM | POA: Diagnosis not present

## 2012-08-08 DIAGNOSIS — M25529 Pain in unspecified elbow: Secondary | ICD-10-CM | POA: Diagnosis not present

## 2012-08-08 DIAGNOSIS — IMO0002 Reserved for concepts with insufficient information to code with codable children: Secondary | ICD-10-CM | POA: Diagnosis not present

## 2012-09-02 DIAGNOSIS — I10 Essential (primary) hypertension: Secondary | ICD-10-CM | POA: Diagnosis not present

## 2012-09-02 DIAGNOSIS — Z6834 Body mass index (BMI) 34.0-34.9, adult: Secondary | ICD-10-CM | POA: Diagnosis not present

## 2012-09-02 DIAGNOSIS — J309 Allergic rhinitis, unspecified: Secondary | ICD-10-CM | POA: Diagnosis not present

## 2012-09-02 DIAGNOSIS — J45909 Unspecified asthma, uncomplicated: Secondary | ICD-10-CM | POA: Diagnosis not present

## 2012-09-12 DIAGNOSIS — D510 Vitamin B12 deficiency anemia due to intrinsic factor deficiency: Secondary | ICD-10-CM | POA: Diagnosis not present

## 2012-10-22 DIAGNOSIS — D518 Other vitamin B12 deficiency anemias: Secondary | ICD-10-CM | POA: Diagnosis not present

## 2012-11-18 DIAGNOSIS — I129 Hypertensive chronic kidney disease with stage 1 through stage 4 chronic kidney disease, or unspecified chronic kidney disease: Secondary | ICD-10-CM | POA: Diagnosis not present

## 2012-11-18 DIAGNOSIS — D649 Anemia, unspecified: Secondary | ICD-10-CM | POA: Diagnosis not present

## 2012-11-18 DIAGNOSIS — E559 Vitamin D deficiency, unspecified: Secondary | ICD-10-CM | POA: Diagnosis not present

## 2012-11-18 DIAGNOSIS — N189 Chronic kidney disease, unspecified: Secondary | ICD-10-CM | POA: Diagnosis not present

## 2012-11-18 DIAGNOSIS — R809 Proteinuria, unspecified: Secondary | ICD-10-CM | POA: Diagnosis not present

## 2012-11-18 DIAGNOSIS — Z79899 Other long term (current) drug therapy: Secondary | ICD-10-CM | POA: Diagnosis not present

## 2012-11-20 DIAGNOSIS — F4321 Adjustment disorder with depressed mood: Secondary | ICD-10-CM | POA: Diagnosis not present

## 2012-11-20 DIAGNOSIS — D649 Anemia, unspecified: Secondary | ICD-10-CM | POA: Diagnosis not present

## 2012-11-20 DIAGNOSIS — D518 Other vitamin B12 deficiency anemias: Secondary | ICD-10-CM | POA: Diagnosis not present

## 2012-11-20 DIAGNOSIS — I10 Essential (primary) hypertension: Secondary | ICD-10-CM | POA: Diagnosis not present

## 2012-11-25 DIAGNOSIS — J45909 Unspecified asthma, uncomplicated: Secondary | ICD-10-CM | POA: Diagnosis not present

## 2012-11-25 DIAGNOSIS — Z6834 Body mass index (BMI) 34.0-34.9, adult: Secondary | ICD-10-CM | POA: Diagnosis not present

## 2012-11-25 DIAGNOSIS — J209 Acute bronchitis, unspecified: Secondary | ICD-10-CM | POA: Diagnosis not present

## 2012-12-09 DIAGNOSIS — S6390XA Sprain of unspecified part of unspecified wrist and hand, initial encounter: Secondary | ICD-10-CM | POA: Diagnosis not present

## 2012-12-11 DIAGNOSIS — M65839 Other synovitis and tenosynovitis, unspecified forearm: Secondary | ICD-10-CM | POA: Diagnosis not present

## 2012-12-19 DIAGNOSIS — D518 Other vitamin B12 deficiency anemias: Secondary | ICD-10-CM | POA: Diagnosis not present

## 2012-12-30 DIAGNOSIS — M65839 Other synovitis and tenosynovitis, unspecified forearm: Secondary | ICD-10-CM | POA: Diagnosis not present

## 2013-01-14 DIAGNOSIS — D518 Other vitamin B12 deficiency anemias: Secondary | ICD-10-CM | POA: Diagnosis not present

## 2013-01-15 DIAGNOSIS — M25549 Pain in joints of unspecified hand: Secondary | ICD-10-CM | POA: Diagnosis not present

## 2013-01-15 DIAGNOSIS — S6000XA Contusion of unspecified finger without damage to nail, initial encounter: Secondary | ICD-10-CM | POA: Diagnosis not present

## 2013-01-22 DIAGNOSIS — M48061 Spinal stenosis, lumbar region without neurogenic claudication: Secondary | ICD-10-CM | POA: Diagnosis not present

## 2013-01-22 DIAGNOSIS — M5137 Other intervertebral disc degeneration, lumbosacral region: Secondary | ICD-10-CM | POA: Diagnosis not present

## 2013-01-22 DIAGNOSIS — G894 Chronic pain syndrome: Secondary | ICD-10-CM | POA: Diagnosis not present

## 2013-01-22 DIAGNOSIS — M545 Low back pain: Secondary | ICD-10-CM | POA: Diagnosis not present

## 2013-02-12 DIAGNOSIS — M48061 Spinal stenosis, lumbar region without neurogenic claudication: Secondary | ICD-10-CM | POA: Diagnosis not present

## 2013-02-12 DIAGNOSIS — M5137 Other intervertebral disc degeneration, lumbosacral region: Secondary | ICD-10-CM | POA: Diagnosis not present

## 2013-02-12 DIAGNOSIS — M545 Low back pain: Secondary | ICD-10-CM | POA: Diagnosis not present

## 2013-02-12 DIAGNOSIS — G894 Chronic pain syndrome: Secondary | ICD-10-CM | POA: Diagnosis not present

## 2013-02-17 DIAGNOSIS — D510 Vitamin B12 deficiency anemia due to intrinsic factor deficiency: Secondary | ICD-10-CM | POA: Diagnosis not present

## 2013-03-24 DIAGNOSIS — D518 Other vitamin B12 deficiency anemias: Secondary | ICD-10-CM | POA: Diagnosis not present

## 2013-03-25 DIAGNOSIS — M48061 Spinal stenosis, lumbar region without neurogenic claudication: Secondary | ICD-10-CM | POA: Diagnosis not present

## 2013-03-25 DIAGNOSIS — E559 Vitamin D deficiency, unspecified: Secondary | ICD-10-CM | POA: Diagnosis not present

## 2013-03-25 DIAGNOSIS — I509 Heart failure, unspecified: Secondary | ICD-10-CM | POA: Diagnosis not present

## 2013-03-25 DIAGNOSIS — M545 Low back pain: Secondary | ICD-10-CM | POA: Diagnosis not present

## 2013-03-25 DIAGNOSIS — I10 Essential (primary) hypertension: Secondary | ICD-10-CM | POA: Diagnosis not present

## 2013-03-25 DIAGNOSIS — Z79899 Other long term (current) drug therapy: Secondary | ICD-10-CM | POA: Diagnosis not present

## 2013-03-25 DIAGNOSIS — G894 Chronic pain syndrome: Secondary | ICD-10-CM | POA: Diagnosis not present

## 2013-03-25 DIAGNOSIS — R809 Proteinuria, unspecified: Secondary | ICD-10-CM | POA: Diagnosis not present

## 2013-03-25 DIAGNOSIS — N189 Chronic kidney disease, unspecified: Secondary | ICD-10-CM | POA: Diagnosis not present

## 2013-03-25 DIAGNOSIS — M5137 Other intervertebral disc degeneration, lumbosacral region: Secondary | ICD-10-CM | POA: Diagnosis not present

## 2013-03-25 DIAGNOSIS — D649 Anemia, unspecified: Secondary | ICD-10-CM | POA: Diagnosis not present

## 2013-04-01 DIAGNOSIS — E1165 Type 2 diabetes mellitus with hyperglycemia: Secondary | ICD-10-CM | POA: Diagnosis not present

## 2013-04-01 DIAGNOSIS — I10 Essential (primary) hypertension: Secondary | ICD-10-CM | POA: Diagnosis not present

## 2013-04-01 DIAGNOSIS — R809 Proteinuria, unspecified: Secondary | ICD-10-CM | POA: Diagnosis not present

## 2013-04-18 DIAGNOSIS — Z23 Encounter for immunization: Secondary | ICD-10-CM | POA: Diagnosis not present

## 2013-04-18 DIAGNOSIS — D510 Vitamin B12 deficiency anemia due to intrinsic factor deficiency: Secondary | ICD-10-CM | POA: Diagnosis not present

## 2013-04-18 DIAGNOSIS — Z6834 Body mass index (BMI) 34.0-34.9, adult: Secondary | ICD-10-CM | POA: Diagnosis not present

## 2013-04-18 DIAGNOSIS — E1129 Type 2 diabetes mellitus with other diabetic kidney complication: Secondary | ICD-10-CM | POA: Diagnosis not present

## 2013-04-18 DIAGNOSIS — I10 Essential (primary) hypertension: Secondary | ICD-10-CM | POA: Diagnosis not present

## 2013-04-21 DIAGNOSIS — E1129 Type 2 diabetes mellitus with other diabetic kidney complication: Secondary | ICD-10-CM | POA: Diagnosis not present

## 2013-04-21 DIAGNOSIS — J449 Chronic obstructive pulmonary disease, unspecified: Secondary | ICD-10-CM | POA: Diagnosis not present

## 2013-04-21 DIAGNOSIS — D649 Anemia, unspecified: Secondary | ICD-10-CM | POA: Diagnosis not present

## 2013-05-14 DIAGNOSIS — D518 Other vitamin B12 deficiency anemias: Secondary | ICD-10-CM | POA: Diagnosis not present

## 2013-06-10 DIAGNOSIS — Z6833 Body mass index (BMI) 33.0-33.9, adult: Secondary | ICD-10-CM | POA: Diagnosis not present

## 2013-06-10 DIAGNOSIS — E785 Hyperlipidemia, unspecified: Secondary | ICD-10-CM | POA: Diagnosis not present

## 2013-06-10 DIAGNOSIS — I10 Essential (primary) hypertension: Secondary | ICD-10-CM | POA: Diagnosis not present

## 2013-06-10 DIAGNOSIS — G5600 Carpal tunnel syndrome, unspecified upper limb: Secondary | ICD-10-CM | POA: Diagnosis not present

## 2013-06-16 DIAGNOSIS — D518 Other vitamin B12 deficiency anemias: Secondary | ICD-10-CM | POA: Diagnosis not present

## 2013-06-30 DIAGNOSIS — R062 Wheezing: Secondary | ICD-10-CM | POA: Diagnosis not present

## 2013-06-30 DIAGNOSIS — R05 Cough: Secondary | ICD-10-CM | POA: Diagnosis not present

## 2013-06-30 DIAGNOSIS — Z6833 Body mass index (BMI) 33.0-33.9, adult: Secondary | ICD-10-CM | POA: Diagnosis not present

## 2013-07-02 DIAGNOSIS — M545 Low back pain: Secondary | ICD-10-CM | POA: Diagnosis not present

## 2013-07-02 DIAGNOSIS — M47817 Spondylosis without myelopathy or radiculopathy, lumbosacral region: Secondary | ICD-10-CM | POA: Diagnosis not present

## 2013-07-02 DIAGNOSIS — G894 Chronic pain syndrome: Secondary | ICD-10-CM | POA: Diagnosis not present

## 2013-07-02 DIAGNOSIS — M5137 Other intervertebral disc degeneration, lumbosacral region: Secondary | ICD-10-CM | POA: Diagnosis not present

## 2013-07-03 DIAGNOSIS — G894 Chronic pain syndrome: Secondary | ICD-10-CM | POA: Diagnosis not present

## 2013-07-03 DIAGNOSIS — IMO0002 Reserved for concepts with insufficient information to code with codable children: Secondary | ICD-10-CM | POA: Diagnosis not present

## 2013-07-03 DIAGNOSIS — M47817 Spondylosis without myelopathy or radiculopathy, lumbosacral region: Secondary | ICD-10-CM | POA: Diagnosis not present

## 2013-07-03 DIAGNOSIS — M545 Low back pain: Secondary | ICD-10-CM | POA: Diagnosis not present

## 2013-08-04 DIAGNOSIS — E559 Vitamin D deficiency, unspecified: Secondary | ICD-10-CM | POA: Diagnosis not present

## 2013-08-04 DIAGNOSIS — N189 Chronic kidney disease, unspecified: Secondary | ICD-10-CM | POA: Diagnosis not present

## 2013-08-07 DIAGNOSIS — Z1231 Encounter for screening mammogram for malignant neoplasm of breast: Secondary | ICD-10-CM | POA: Diagnosis not present

## 2013-09-02 ENCOUNTER — Ambulatory Visit (HOSPITAL_COMMUNITY)
Admission: RE | Admit: 2013-09-02 | Discharge: 2013-09-02 | Disposition: A | Discharge status: Home / Self Care | Payer: Medicare Other | Source: Ambulatory Visit | Attending: Physician Assistant | Admitting: Physician Assistant

## 2013-09-02 ENCOUNTER — Other Ambulatory Visit (HOSPITAL_COMMUNITY): Payer: Self-pay | Admitting: Physician Assistant

## 2013-09-02 DIAGNOSIS — M2390 Unspecified internal derangement of unspecified knee: Secondary | ICD-10-CM | POA: Diagnosis not present

## 2013-09-02 DIAGNOSIS — M25569 Pain in unspecified knee: Secondary | ICD-10-CM | POA: Diagnosis not present

## 2013-09-02 DIAGNOSIS — M25561 Pain in right knee: Secondary | ICD-10-CM

## 2013-09-02 DIAGNOSIS — M1710 Unilateral primary osteoarthritis, unspecified knee: Secondary | ICD-10-CM | POA: Insufficient documentation

## 2013-09-02 DIAGNOSIS — Z6833 Body mass index (BMI) 33.0-33.9, adult: Secondary | ICD-10-CM | POA: Diagnosis not present

## 2013-09-02 DIAGNOSIS — I70209 Unspecified atherosclerosis of native arteries of extremities, unspecified extremity: Secondary | ICD-10-CM | POA: Diagnosis not present

## 2013-09-02 DIAGNOSIS — M25469 Effusion, unspecified knee: Secondary | ICD-10-CM | POA: Diagnosis not present

## 2013-09-02 DIAGNOSIS — IMO0002 Reserved for concepts with insufficient information to code with codable children: Secondary | ICD-10-CM

## 2013-09-15 DIAGNOSIS — D518 Other vitamin B12 deficiency anemias: Secondary | ICD-10-CM | POA: Diagnosis not present

## 2013-09-17 ENCOUNTER — Ambulatory Visit: Payer: Medicare Other | Admitting: Orthopedic Surgery

## 2013-09-18 ENCOUNTER — Ambulatory Visit (INDEPENDENT_AMBULATORY_CARE_PROVIDER_SITE_OTHER): Payer: Medicare Other | Admitting: Orthopedic Surgery

## 2013-09-18 ENCOUNTER — Encounter: Payer: Self-pay | Admitting: Orthopedic Surgery

## 2013-09-18 VITALS — BP 117/75 | Ht 59.0 in | Wt 166.0 lb

## 2013-09-18 DIAGNOSIS — M1710 Unilateral primary osteoarthritis, unspecified knee: Secondary | ICD-10-CM

## 2013-09-18 DIAGNOSIS — IMO0002 Reserved for concepts with insufficient information to code with codable children: Secondary | ICD-10-CM

## 2013-09-18 DIAGNOSIS — M1711 Unilateral primary osteoarthritis, right knee: Secondary | ICD-10-CM

## 2013-09-18 DIAGNOSIS — M25561 Pain in right knee: Secondary | ICD-10-CM | POA: Insufficient documentation

## 2013-09-18 DIAGNOSIS — M25569 Pain in unspecified knee: Secondary | ICD-10-CM | POA: Diagnosis not present

## 2013-09-18 DIAGNOSIS — M23302 Other meniscus derangements, unspecified lateral meniscus, unspecified knee: Secondary | ICD-10-CM | POA: Diagnosis not present

## 2013-09-18 NOTE — Progress Notes (Signed)
Chief Complaint  Patient presents with  . Knee Pain    Right knee pain. Referred by Rowan Blase, PA    This is a 73 year old female with multiple medical problems who presents with pain in her right knee x1 month which started after she stood up from a seated position and twisted to her right. She has severe pain in her right leg for a couple of weeks but has subsequently noticed a decrease in the amount of pain. Treatment includes Tylenol Extra Strength and a cortisone shot. She had an x-ray which showed osteoarthritis of the patellofemoral joint. Her knee has gotten better over the last few weeks she denies catching locking or giving way but does complain of popping when she flexes the knee. Review of systems positive for snoring diarrhea easy bruising seasonal allergies otherwise normal  Past Medical History  Diagnosis Date  . Renal disorder   . Gout   . Hypertension   . Asthma   . Diabetes mellitus   . Back pain    Past Surgical History  Procedure Laterality Date  . Breast surgery    . Abdominal hysterectomy    . Bladder surgery    . Foot surgery     The past, family history and social history have been reviewed and are recorded in the corresponding sections of epic   BP 117/75  Ht 4\' 11"  (1.499 m)  Wt 166 lb (75.297 kg)  BMI 33.51 kg/m2 General appearance is normal, the patient is alert and oriented x3 with normal mood and affect. Body habitus is endomorphic. She ambulates with a walker. This is chronic.  The left knee looks normal has full range of motion normal ligaments grade 5 motor strength normal skin distal pulses are intact and the sensation is normal  The right knee has full flexion a small trace amount of joint effusion. Ligaments are stable. Motor grade 5. Skin intact good pulse normal sensation  Provocative tests McMurray's negative. Lateral joint line tenderness.  X-ray shows mild tibiofemoral arthritis moderate patellofemoral arthritis  Encounter Diagnoses   Name Primary?  . Right knee pain Yes  . Lateral meniscus derangement   . Arthritis of right knee     Her knee is actually getting better has some set on this when for about 4 more weeks, she can continue Tylenol. She is given an economy hinged wrap on knee brace reevaluate knee in a month.

## 2013-09-18 NOTE — Patient Instructions (Signed)
Wear knee brace

## 2013-10-17 DIAGNOSIS — D518 Other vitamin B12 deficiency anemias: Secondary | ICD-10-CM | POA: Diagnosis not present

## 2013-10-21 ENCOUNTER — Ambulatory Visit: Payer: Medicare Other | Admitting: Orthopedic Surgery

## 2013-10-31 DIAGNOSIS — Z6832 Body mass index (BMI) 32.0-32.9, adult: Secondary | ICD-10-CM | POA: Diagnosis not present

## 2013-10-31 DIAGNOSIS — E1149 Type 2 diabetes mellitus with other diabetic neurological complication: Secondary | ICD-10-CM | POA: Diagnosis not present

## 2013-10-31 DIAGNOSIS — J449 Chronic obstructive pulmonary disease, unspecified: Secondary | ICD-10-CM | POA: Diagnosis not present

## 2013-10-31 DIAGNOSIS — E1142 Type 2 diabetes mellitus with diabetic polyneuropathy: Secondary | ICD-10-CM | POA: Diagnosis not present

## 2013-10-31 DIAGNOSIS — M159 Polyosteoarthritis, unspecified: Secondary | ICD-10-CM | POA: Diagnosis not present

## 2013-11-03 DIAGNOSIS — M545 Low back pain, unspecified: Secondary | ICD-10-CM | POA: Diagnosis not present

## 2013-11-03 DIAGNOSIS — M5137 Other intervertebral disc degeneration, lumbosacral region: Secondary | ICD-10-CM | POA: Diagnosis not present

## 2013-11-03 DIAGNOSIS — G894 Chronic pain syndrome: Secondary | ICD-10-CM | POA: Diagnosis not present

## 2013-11-03 DIAGNOSIS — M48061 Spinal stenosis, lumbar region without neurogenic claudication: Secondary | ICD-10-CM | POA: Diagnosis not present

## 2013-11-05 DIAGNOSIS — M19019 Primary osteoarthritis, unspecified shoulder: Secondary | ICD-10-CM | POA: Diagnosis not present

## 2013-11-12 DIAGNOSIS — E11359 Type 2 diabetes mellitus with proliferative diabetic retinopathy without macular edema: Secondary | ICD-10-CM | POA: Diagnosis not present

## 2013-11-12 DIAGNOSIS — H52229 Regular astigmatism, unspecified eye: Secondary | ICD-10-CM | POA: Diagnosis not present

## 2013-11-12 DIAGNOSIS — E119 Type 2 diabetes mellitus without complications: Secondary | ICD-10-CM | POA: Diagnosis not present

## 2013-11-12 DIAGNOSIS — H524 Presbyopia: Secondary | ICD-10-CM | POA: Diagnosis not present

## 2013-11-12 DIAGNOSIS — D643 Other sideroblastic anemias: Secondary | ICD-10-CM | POA: Diagnosis not present

## 2013-12-02 DIAGNOSIS — J309 Allergic rhinitis, unspecified: Secondary | ICD-10-CM | POA: Diagnosis not present

## 2013-12-02 DIAGNOSIS — J0100 Acute maxillary sinusitis, unspecified: Secondary | ICD-10-CM | POA: Diagnosis not present

## 2013-12-02 DIAGNOSIS — I10 Essential (primary) hypertension: Secondary | ICD-10-CM | POA: Diagnosis not present

## 2013-12-02 DIAGNOSIS — J209 Acute bronchitis, unspecified: Secondary | ICD-10-CM | POA: Diagnosis not present

## 2013-12-02 DIAGNOSIS — Z6831 Body mass index (BMI) 31.0-31.9, adult: Secondary | ICD-10-CM | POA: Diagnosis not present

## 2013-12-02 DIAGNOSIS — J042 Acute laryngotracheitis: Secondary | ICD-10-CM | POA: Diagnosis not present

## 2013-12-15 DIAGNOSIS — E559 Vitamin D deficiency, unspecified: Secondary | ICD-10-CM | POA: Diagnosis not present

## 2013-12-15 DIAGNOSIS — D649 Anemia, unspecified: Secondary | ICD-10-CM | POA: Diagnosis not present

## 2013-12-15 DIAGNOSIS — N189 Chronic kidney disease, unspecified: Secondary | ICD-10-CM | POA: Diagnosis not present

## 2013-12-15 DIAGNOSIS — R809 Proteinuria, unspecified: Secondary | ICD-10-CM | POA: Diagnosis not present

## 2013-12-15 DIAGNOSIS — Z79899 Other long term (current) drug therapy: Secondary | ICD-10-CM | POA: Diagnosis not present

## 2013-12-15 DIAGNOSIS — I10 Essential (primary) hypertension: Secondary | ICD-10-CM | POA: Diagnosis not present

## 2013-12-17 DIAGNOSIS — N183 Chronic kidney disease, stage 3 unspecified: Secondary | ICD-10-CM | POA: Diagnosis not present

## 2013-12-17 DIAGNOSIS — R809 Proteinuria, unspecified: Secondary | ICD-10-CM | POA: Diagnosis not present

## 2013-12-17 DIAGNOSIS — D518 Other vitamin B12 deficiency anemias: Secondary | ICD-10-CM | POA: Diagnosis not present

## 2013-12-17 DIAGNOSIS — I10 Essential (primary) hypertension: Secondary | ICD-10-CM | POA: Diagnosis not present

## 2013-12-30 ENCOUNTER — Ambulatory Visit: Payer: Medicare Other | Admitting: Orthopedic Surgery

## 2014-01-15 DIAGNOSIS — D518 Other vitamin B12 deficiency anemias: Secondary | ICD-10-CM | POA: Diagnosis not present

## 2014-02-13 DIAGNOSIS — D518 Other vitamin B12 deficiency anemias: Secondary | ICD-10-CM | POA: Diagnosis not present

## 2014-02-24 DIAGNOSIS — M48061 Spinal stenosis, lumbar region without neurogenic claudication: Secondary | ICD-10-CM | POA: Diagnosis not present

## 2014-02-24 DIAGNOSIS — M545 Low back pain, unspecified: Secondary | ICD-10-CM | POA: Diagnosis not present

## 2014-02-24 DIAGNOSIS — G894 Chronic pain syndrome: Secondary | ICD-10-CM | POA: Diagnosis not present

## 2014-02-24 DIAGNOSIS — M5137 Other intervertebral disc degeneration, lumbosacral region: Secondary | ICD-10-CM | POA: Diagnosis not present

## 2014-03-17 DIAGNOSIS — D518 Other vitamin B12 deficiency anemias: Secondary | ICD-10-CM | POA: Diagnosis not present

## 2014-04-13 DIAGNOSIS — E559 Vitamin D deficiency, unspecified: Secondary | ICD-10-CM | POA: Diagnosis not present

## 2014-04-13 DIAGNOSIS — N189 Chronic kidney disease, unspecified: Secondary | ICD-10-CM | POA: Diagnosis not present

## 2014-04-13 DIAGNOSIS — D649 Anemia, unspecified: Secondary | ICD-10-CM | POA: Diagnosis not present

## 2014-04-13 DIAGNOSIS — R809 Proteinuria, unspecified: Secondary | ICD-10-CM | POA: Diagnosis not present

## 2014-04-13 DIAGNOSIS — Z79899 Other long term (current) drug therapy: Secondary | ICD-10-CM | POA: Diagnosis not present

## 2014-04-13 DIAGNOSIS — I10 Essential (primary) hypertension: Secondary | ICD-10-CM | POA: Diagnosis not present

## 2014-04-15 DIAGNOSIS — N183 Chronic kidney disease, stage 3 unspecified: Secondary | ICD-10-CM | POA: Diagnosis not present

## 2014-04-15 DIAGNOSIS — I10 Essential (primary) hypertension: Secondary | ICD-10-CM | POA: Diagnosis not present

## 2014-04-15 DIAGNOSIS — R634 Abnormal weight loss: Secondary | ICD-10-CM | POA: Diagnosis not present

## 2014-04-16 DIAGNOSIS — I10 Essential (primary) hypertension: Secondary | ICD-10-CM | POA: Diagnosis not present

## 2014-04-16 DIAGNOSIS — Z6830 Body mass index (BMI) 30.0-30.9, adult: Secondary | ICD-10-CM | POA: Diagnosis not present

## 2014-04-16 DIAGNOSIS — E119 Type 2 diabetes mellitus without complications: Secondary | ICD-10-CM | POA: Diagnosis not present

## 2014-04-16 DIAGNOSIS — E538 Deficiency of other specified B group vitamins: Secondary | ICD-10-CM | POA: Diagnosis not present

## 2014-04-16 DIAGNOSIS — E785 Hyperlipidemia, unspecified: Secondary | ICD-10-CM | POA: Diagnosis not present

## 2014-04-16 DIAGNOSIS — E1149 Type 2 diabetes mellitus with other diabetic neurological complication: Secondary | ICD-10-CM | POA: Diagnosis not present

## 2014-04-16 DIAGNOSIS — R634 Abnormal weight loss: Secondary | ICD-10-CM | POA: Diagnosis not present

## 2014-04-17 ENCOUNTER — Other Ambulatory Visit (HOSPITAL_COMMUNITY): Payer: Self-pay | Admitting: Internal Medicine

## 2014-04-17 DIAGNOSIS — R6881 Early satiety: Secondary | ICD-10-CM

## 2014-04-17 DIAGNOSIS — R634 Abnormal weight loss: Secondary | ICD-10-CM

## 2014-04-20 ENCOUNTER — Other Ambulatory Visit (HOSPITAL_COMMUNITY): Payer: Self-pay | Admitting: Internal Medicine

## 2014-04-20 ENCOUNTER — Ambulatory Visit (HOSPITAL_COMMUNITY)
Admission: RE | Admit: 2014-04-20 | Discharge: 2014-04-20 | Disposition: A | Discharge status: Home / Self Care | Payer: Medicare Other | Source: Ambulatory Visit | Attending: Internal Medicine | Admitting: Internal Medicine

## 2014-04-20 DIAGNOSIS — R6881 Early satiety: Secondary | ICD-10-CM | POA: Insufficient documentation

## 2014-04-20 DIAGNOSIS — R935 Abnormal findings on diagnostic imaging of other abdominal regions, including retroperitoneum: Secondary | ICD-10-CM | POA: Diagnosis not present

## 2014-04-20 DIAGNOSIS — N200 Calculus of kidney: Secondary | ICD-10-CM | POA: Insufficient documentation

## 2014-04-20 DIAGNOSIS — R634 Abnormal weight loss: Secondary | ICD-10-CM | POA: Insufficient documentation

## 2014-04-20 LAB — POCT I-STAT CREATININE: Creatinine, Ser: 1.6 mg/dL — ABNORMAL HIGH (ref 0.50–1.10)

## 2014-04-21 ENCOUNTER — Other Ambulatory Visit (HOSPITAL_COMMUNITY): Payer: Self-pay | Admitting: Internal Medicine

## 2014-04-21 DIAGNOSIS — D490 Neoplasm of unspecified behavior of digestive system: Secondary | ICD-10-CM

## 2014-04-23 ENCOUNTER — Ambulatory Visit (HOSPITAL_COMMUNITY)
Admission: RE | Admit: 2014-04-23 | Discharge: 2014-04-23 | Disposition: A | Discharge status: Home / Self Care | Payer: Medicare Other | Source: Ambulatory Visit | Attending: Internal Medicine | Admitting: Internal Medicine

## 2014-04-23 DIAGNOSIS — K838 Other specified diseases of biliary tract: Secondary | ICD-10-CM | POA: Insufficient documentation

## 2014-04-23 DIAGNOSIS — K862 Cyst of pancreas: Secondary | ICD-10-CM | POA: Diagnosis not present

## 2014-04-23 DIAGNOSIS — R6881 Early satiety: Secondary | ICD-10-CM | POA: Insufficient documentation

## 2014-04-23 DIAGNOSIS — K449 Diaphragmatic hernia without obstruction or gangrene: Secondary | ICD-10-CM | POA: Insufficient documentation

## 2014-04-23 DIAGNOSIS — R1909 Other intra-abdominal and pelvic swelling, mass and lump: Secondary | ICD-10-CM | POA: Insufficient documentation

## 2014-04-23 DIAGNOSIS — K8050 Calculus of bile duct without cholangitis or cholecystitis without obstruction: Secondary | ICD-10-CM | POA: Diagnosis not present

## 2014-04-23 DIAGNOSIS — R634 Abnormal weight loss: Secondary | ICD-10-CM | POA: Insufficient documentation

## 2014-04-23 DIAGNOSIS — K863 Pseudocyst of pancreas: Secondary | ICD-10-CM | POA: Diagnosis not present

## 2014-04-23 DIAGNOSIS — D490 Neoplasm of unspecified behavior of digestive system: Secondary | ICD-10-CM

## 2014-04-23 MED ORDER — GADOBENATE DIMEGLUMINE 529 MG/ML IV SOLN
7.0000 mL | Freq: Once | INTRAVENOUS | Status: AC | PRN
  Administered 2014-04-23: 7 mL via INTRAVENOUS

## 2014-05-14 ENCOUNTER — Encounter (INDEPENDENT_AMBULATORY_CARE_PROVIDER_SITE_OTHER): Payer: Self-pay | Admitting: *Deleted

## 2014-05-19 ENCOUNTER — Ambulatory Visit (INDEPENDENT_AMBULATORY_CARE_PROVIDER_SITE_OTHER): Payer: Medicare Other | Admitting: Internal Medicine

## 2014-05-19 ENCOUNTER — Encounter (INDEPENDENT_AMBULATORY_CARE_PROVIDER_SITE_OTHER): Payer: Self-pay | Admitting: Internal Medicine

## 2014-05-19 VITALS — BP 142/68 | HR 80 | Temp 98.1°F | Ht 59.0 in | Wt 153.3 lb

## 2014-05-19 DIAGNOSIS — E538 Deficiency of other specified B group vitamins: Secondary | ICD-10-CM | POA: Diagnosis not present

## 2014-05-19 DIAGNOSIS — K869 Disease of pancreas, unspecified: Secondary | ICD-10-CM

## 2014-05-19 DIAGNOSIS — K8050 Calculus of bile duct without cholangitis or cholecystitis without obstruction: Secondary | ICD-10-CM | POA: Insufficient documentation

## 2014-05-19 DIAGNOSIS — K8689 Other specified diseases of pancreas: Secondary | ICD-10-CM

## 2014-05-19 NOTE — Patient Instructions (Signed)
CBC, CMET, Lipase. Referral to Dr Newman Pies at [facility]

## 2014-05-19 NOTE — Progress Notes (Addendum)
Subjective:    Patient ID: Lindsay Cortez, female    DOB: May 19, 1941, 73 y.o.   MRN: [id number]  HPI Referred to our office by Dr. Gerarda Fraction for ? Pancreatic cyst. Patient also has a dilated CBD with stones. She was seen by Dr. Gerarda Fraction for weight loss. She has lost about 30 pounds over the past year. Her appetite is okay. She says she has early satiety.  There is no abdominal pain. She says she has no pain except for her back which is chronic. She has a BM daily. Takes Imodium for loose stools which helps.  04/16/2014 TSH 1.686, Cortisol 72. (NORMAL).  Glucose 126, BUN 20,  Creatinine 1.47, total bili 0.4, ALP 75, AST 16, ALT 9, total protein 6.2, Albumin 4.1, Calcium 10.1, WBC 11.6, H and H 12.3 and 37.0, MCV 95.9, Platelet ct 279 Hep B Surface Antigen negative, Hepatitis C antibody negative, Hepatitis B core Ab, IgM non-reactive., Hepatitis A antibody, IgM non reactive.  04/23/2014 MRI w/wo contrast:  FINDINGS:  Liver: A tiny sub-cm cyst is seen in the lateral segment of the left  hepatic lobe, but no liver masses are identified.  Gallbladder/Biliary: Tiny layering gallstones or gallbladder sludge  noted. No evidence of cholecystitis. Mild diffuse biliary ductal  dilatation is seen with common bile duct measuring 12 mm. One or 2  small calculi are seen in the distal common bile duct measuring  approximately 5 mm.  Pancreas: A simple appearing cyst with no contrast enhancement is  seen in the pancreatic body which measures 1.3 x 2.0 cm on image 20  of series 22. This shows probable communication with the main  pancreatic duct, and is most consistent with a side-branch  intraductal papillary mucinous neoplasm or pseudocyst. A second  similar tiny unilocular cyst measuring 6 mm is seen in the  pancreatic uncinate process on image 24 of series 22. No evidence of  main pancreatic duct and gallbladder dilatation. No solid pancreatic  mass identified.  Spleen: Within normal limits in size.  Small benign appearing cystic  lesions are noted in the spleen, without significant change compared  to previous noncontrast CT on 08/28/2011.     Review of Systems Past Medical History  Diagnosis Date  . Renal disorder   . Gout   . Hypertension   . Asthma   . Diabetes mellitus     x 15 yrs  . Back pain     Past Surgical History  Procedure Laterality Date  . Breast surgery      benign  . Abdominal hysterectomy    . Bladder surgery      bladder tac  . Foot surgery      Allergies  Allergen Reactions  . Fentanyl Nausea And Vomiting  . Codeine     Narcotic pain medicine makes her nauseated.  Says was told to only take tylenol due to kidney disease.    Current Outpatient Prescriptions on File Prior to Visit  Medication Sig Dispense Refill  . acetaminophen (TYLENOL) 500 MG tablet Take 500 mg by mouth as needed. For pain       . albuterol (VENTOLIN HFA) 108 (90 BASE) MCG/ACT inhaler Inhale 2 puffs into the lungs daily as needed. For rescue       . allopurinol (ZYLOPRIM) 100 MG tablet Take 200 mg by mouth every morning.        Marland Kitchen amLODipine (NORVASC) 5 MG tablet Take 5 mg by mouth daily.      Marland Kitchen  budesonide-formoterol (SYMBICORT) 80-4.5 MCG/ACT inhaler Inhale 2 puffs into the lungs daily.       . Calcium Carbonate-Vitamin D (CALCIUM 600 + D PO) Take 1 tablet by mouth daily.        . Cholecalciferol (VITAMIN D) 2000 UNITS CAPS Take 1 capsule by mouth every morning.        Marland Kitchen glimepiride (AMARYL) 2 MG tablet Take 2 mg by mouth 2 (two) times daily.       Marland Kitchen loperamide (IMODIUM) 2 MG capsule Take 2 mg by mouth every morning.        . metoprolol (LOPRESSOR) 50 MG tablet Take 50 mg by mouth daily.        . simvastatin (ZOCOR) 10 MG tablet Take 10 mg by mouth at bedtime.        Marland Kitchen albuterol (PROVENTIL) (2.5 MG/3ML) 0.083% nebulizer solution Take 3 mLs (2.5 mg total) by nebulization every 4 (four) hours as needed for wheezing.  75 mL  12  . lisinopril (PRINIVIL,ZESTRIL) 5 MG tablet Take 5  mg by mouth daily.       No current facility-administered medications on file prior to visit.        Objective:   Physical Exam  Filed Vitals:   05/19/14 1408  BP: 142/68  Pulse: 80  Temp: 98.1 F (36.7 C)  Height: 4\' 11"  (1.499 m)  Weight: 153 lb 4.8 oz (69.536 kg)   Alert and oriented. Skin warm and dry. Oral mucosa is moist.   . Sclera anicteric, conjunctivae is pink. Thyroid not enlarged. No cervical lymphadenopathy. Lungs clear. Heart regular rate and rhythm.  Abdomen is soft. Bowel sounds are positive. No hepatomegaly. No abdominal masses felt. No tenderness.  No edema to lower extremities.          Assessment & Plan:   Assessment: Pancreatic mass, stones in CBD. I discussed with Dr. Laural Golden. Recommendations: EUS with Fine needle biopsy and ERCP Dr.Conway at [facility]

## 2014-05-20 LAB — COMPREHENSIVE METABOLIC PANEL
ALBUMIN: 4.2 g/dL (ref 3.5–5.2)
ALK PHOS: 68 U/L (ref 39–117)
ALT: 10 U/L (ref 0–35)
AST: 20 U/L (ref 0–37)
BUN: 18 mg/dL (ref 6–23)
CO2: 27 mEq/L (ref 19–32)
CREATININE: 1.39 mg/dL — AB (ref 0.50–1.10)
Calcium: 10.2 mg/dL (ref 8.4–10.5)
Chloride: 106 mEq/L (ref 96–112)
GLUCOSE: 76 mg/dL (ref 70–99)
POTASSIUM: 4.5 meq/L (ref 3.5–5.3)
Sodium: 142 mEq/L (ref 135–145)
Total Bilirubin: 0.6 mg/dL (ref 0.2–1.2)
Total Protein: 7 g/dL (ref 6.0–8.3)

## 2014-05-20 LAB — CBC WITH DIFFERENTIAL/PLATELET
BASOS ABS: 0 10*3/uL (ref 0.0–0.1)
BASOS PCT: 0 % (ref 0–1)
EOS ABS: 0.3 10*3/uL (ref 0.0–0.7)
EOS PCT: 2 % (ref 0–5)
HEMATOCRIT: 37.3 % (ref 36.0–46.0)
HEMOGLOBIN: 12.1 g/dL (ref 12.0–15.0)
Lymphocytes Relative: 21 % (ref 12–46)
Lymphs Abs: 2.7 10*3/uL (ref 0.7–4.0)
MCH: 29.8 pg (ref 26.0–34.0)
MCHC: 32.4 g/dL (ref 30.0–36.0)
MCV: 91.9 fL (ref 78.0–100.0)
MONO ABS: 1 10*3/uL (ref 0.1–1.0)
MONOS PCT: 8 % (ref 3–12)
NEUTROS ABS: 8.9 10*3/uL — AB (ref 1.7–7.7)
Neutrophils Relative %: 69 % (ref 43–77)
Platelets: 317 10*3/uL (ref 150–400)
RBC: 4.06 MIL/uL (ref 3.87–5.11)
RDW: 14.4 % (ref 11.5–15.5)
WBC: 12.9 10*3/uL — ABNORMAL HIGH (ref 4.0–10.5)

## 2014-05-20 LAB — LIPASE: Lipase: 15 U/L (ref 0–75)

## 2014-05-26 DIAGNOSIS — K862 Cyst of pancreas: Secondary | ICD-10-CM | POA: Diagnosis not present

## 2014-05-26 DIAGNOSIS — K8050 Calculus of bile duct without cholangitis or cholecystitis without obstruction: Secondary | ICD-10-CM | POA: Diagnosis not present

## 2014-05-26 DIAGNOSIS — R948 Abnormal results of function studies of other organs and systems: Secondary | ICD-10-CM | POA: Diagnosis not present

## 2014-05-26 DIAGNOSIS — E119 Type 2 diabetes mellitus without complications: Secondary | ICD-10-CM | POA: Diagnosis not present

## 2014-05-26 DIAGNOSIS — N189 Chronic kidney disease, unspecified: Secondary | ICD-10-CM | POA: Diagnosis not present

## 2014-05-26 DIAGNOSIS — R933 Abnormal findings on diagnostic imaging of other parts of digestive tract: Secondary | ICD-10-CM | POA: Diagnosis not present

## 2014-05-26 DIAGNOSIS — I129 Hypertensive chronic kidney disease with stage 1 through stage 4 chronic kidney disease, or unspecified chronic kidney disease: Secondary | ICD-10-CM | POA: Diagnosis not present

## 2014-05-26 DIAGNOSIS — J45909 Unspecified asthma, uncomplicated: Secondary | ICD-10-CM | POA: Diagnosis not present

## 2014-05-28 ENCOUNTER — Telehealth (INDEPENDENT_AMBULATORY_CARE_PROVIDER_SITE_OTHER): Payer: Self-pay | Admitting: Internal Medicine

## 2014-05-28 NOTE — Telephone Encounter (Signed)
Results given to patient.  Lindsay Cortez, OV in 6 months.  Lindsay Cortez,

## 2014-05-28 NOTE — Telephone Encounter (Signed)
05/26/2014 EUS: Dr. Jerl Santos, MD.  Duodenum: There was a periampulary diverticulum. Pancreas: A single 26mm x 12 mm oval and anechoic cyst was found in the body of the pancreas. There was no mural nodules noted within the cyst. It appeared to be connected to the main pancreatic duct. Biliary system: The CBD was 12 mm in diameter. There were no filling defects noted. Gallbladder: THe GB demonstrated no endosonographic abnormalities. Recommendations: F/U imaging for pancreatic cyst in 6 months. Pathology: None Results have been given to patient.  Butch Penny, OV in 6 months.  Ann, MRI abdomen with and without contrast in 6 months.

## 2014-05-29 NOTE — Telephone Encounter (Signed)
6 month MRI noted in recall

## 2014-06-05 ENCOUNTER — Encounter (INDEPENDENT_AMBULATORY_CARE_PROVIDER_SITE_OTHER): Payer: Self-pay | Admitting: *Deleted

## 2014-06-05 NOTE — Telephone Encounter (Signed)
Apt has been scheduled for 12/08/14 with Dr. Laural Golden.

## 2014-06-19 DIAGNOSIS — Z23 Encounter for immunization: Secondary | ICD-10-CM | POA: Diagnosis not present

## 2014-06-19 DIAGNOSIS — E538 Deficiency of other specified B group vitamins: Secondary | ICD-10-CM | POA: Diagnosis not present

## 2014-06-20 ENCOUNTER — Emergency Department (HOSPITAL_COMMUNITY): Payer: Medicare Other

## 2014-06-20 ENCOUNTER — Emergency Department (HOSPITAL_COMMUNITY)
Admission: EM | Admit: 2014-06-20 | Discharge: 2014-06-20 | Disposition: A | Discharge status: Home / Self Care | Payer: Medicare Other | Attending: Emergency Medicine | Admitting: Emergency Medicine

## 2014-06-20 DIAGNOSIS — Z79899 Other long term (current) drug therapy: Secondary | ICD-10-CM | POA: Diagnosis not present

## 2014-06-20 DIAGNOSIS — Y9389 Activity, other specified: Secondary | ICD-10-CM | POA: Diagnosis not present

## 2014-06-20 DIAGNOSIS — Y929 Unspecified place or not applicable: Secondary | ICD-10-CM | POA: Diagnosis not present

## 2014-06-20 DIAGNOSIS — I10 Essential (primary) hypertension: Secondary | ICD-10-CM | POA: Diagnosis not present

## 2014-06-20 DIAGNOSIS — E119 Type 2 diabetes mellitus without complications: Secondary | ICD-10-CM | POA: Insufficient documentation

## 2014-06-20 DIAGNOSIS — J45909 Unspecified asthma, uncomplicated: Secondary | ICD-10-CM | POA: Diagnosis not present

## 2014-06-20 DIAGNOSIS — R52 Pain, unspecified: Secondary | ICD-10-CM | POA: Diagnosis not present

## 2014-06-20 DIAGNOSIS — S4991XA Unspecified injury of right shoulder and upper arm, initial encounter: Secondary | ICD-10-CM | POA: Diagnosis present

## 2014-06-20 DIAGNOSIS — X58XXXA Exposure to other specified factors, initial encounter: Secondary | ICD-10-CM | POA: Insufficient documentation

## 2014-06-20 DIAGNOSIS — S43001A Unspecified subluxation of right shoulder joint, initial encounter: Secondary | ICD-10-CM | POA: Diagnosis not present

## 2014-06-20 DIAGNOSIS — Z87448 Personal history of other diseases of urinary system: Secondary | ICD-10-CM | POA: Insufficient documentation

## 2014-06-20 DIAGNOSIS — M79601 Pain in right arm: Secondary | ICD-10-CM | POA: Diagnosis not present

## 2014-06-20 DIAGNOSIS — M109 Gout, unspecified: Secondary | ICD-10-CM | POA: Insufficient documentation

## 2014-06-20 DIAGNOSIS — M25511 Pain in right shoulder: Secondary | ICD-10-CM | POA: Diagnosis not present

## 2014-06-20 NOTE — ED Notes (Signed)
Transporter here to get pt for x-ray and pt states that she thinks her shoulder has "popped back in"; EDP aware

## 2014-06-20 NOTE — Discharge Instructions (Signed)
Follow-up with orthopedics. Referral information provided. Keep the shoulder immobilizer in place as much as possible. Can take it off to bathe. I also been taken off to do just a little bit of motion of the shoulder. Return for any new or worse symptoms. X-rays today were normal.

## 2014-06-20 NOTE — ED Provider Notes (Signed)
CSN: [id number]     Arrival date & time 06/20/14  1046 History  This chart was scribed for Fredia Sorrow, MD by Marlowe Kays, ED Scribe. This patient was seen in room APA12/APA12 and the patient's care was started at 11:40 AM.  Chief Complaint  Patient presents with  . Shoulder Injury   Patient is a 73 y.o. female presenting with shoulder injury. The history is provided by the patient. No language interpreter was used.  Shoulder Injury This is a recurrent problem. The current episode started 1 to 2 hours ago. The problem has been gradually improving. Pertinent negatives include no chest pain, no abdominal pain, no headaches and no shortness of breath. The symptoms are aggravated by exertion. Nothing relieves the symptoms. She has tried nothing for the symptoms.   HPI Comments:  Lindsay Cortez is a 73 y.o. female with PMHx of renal disorder, HTN, DM, asthma, gout and chronic back pain, who presents to the Emergency Department complaining of a right shoulder injury that occurred about 2 hours ago. She states she was bending over to looking for a tablet she dropped and states the shoulder "popped" out of place. Reports the pain was 10/10. She states that she feels like the shoulder went back into place about 30 minutes ago and now denies any current pain. Pt reports chronic back pain that is normal at baseline. She denies any past injury to the shoulder. Denies fever, chills, visual changes, rhinorrhea, sore throat, cough, SOB, CP, leg swelling, abdominal pain, nausea, vomiting, diarrhea, dysuria, neck pain or rash. Pt is right hand dominant. Ambulatory with a walker.   PCP-Dr. Gerarda Fraction  Past Medical History  Diagnosis Date  . Renal disorder   . Gout   . Hypertension   . Asthma   . Diabetes mellitus     x 15 yrs  . Back pain    Past Surgical History  Procedure Laterality Date  . Breast surgery      benign  . Abdominal hysterectomy    . Bladder surgery      bladder tac  . Foot  surgery     No family history on file. History  Substance Use Topics  . Smoking status: Never Smoker   . Smokeless tobacco: Not on file  . Alcohol Use: No   OB History   Grav Para Term Preterm Abortions TAB SAB Ect Mult Living                 Review of Systems  Constitutional: Negative for fever and chills.  HENT: Negative for rhinorrhea and sore throat.   Eyes: Negative for visual disturbance.  Respiratory: Negative for cough and shortness of breath.   Cardiovascular: Negative for chest pain and leg swelling.  Gastrointestinal: Negative for nausea, vomiting, abdominal pain and diarrhea.  Genitourinary: Negative for dysuria.  Musculoskeletal: Positive for back pain. Negative for neck pain.  Skin: Negative for rash.  Neurological: Negative for headaches.  Hematological: Does not bruise/bleed easily.  Psychiatric/Behavioral: Negative for confusion.    Allergies  Fentanyl and Codeine  Home Medications   Prior to Admission medications   Medication Sig Start Date End Date Taking? Authorizing Provider  albuterol (PROVENTIL) (2.5 MG/3ML) 0.083% nebulizer solution Take 2.5 mg by nebulization every 4 (four) hours as needed for wheezing.   Yes Historical Provider, MD  allopurinol (ZYLOPRIM) 100 MG tablet Take 200 mg by mouth every morning.     Yes Historical Provider, MD  amLODipine (NORVASC) 5 MG tablet Take  5 mg by mouth daily.   Yes Historical Provider, MD  budesonide-formoterol (SYMBICORT) 80-4.5 MCG/ACT inhaler Inhale 2 puffs into the lungs 2 (two) times daily.    Yes Historical Provider, MD  Calcium Carbonate-Vitamin D (CALCIUM 600 + D PO) Take 1 tablet by mouth daily.     Yes Historical Provider, MD  Cholecalciferol (VITAMIN D) 2000 UNITS CAPS Take 1 capsule by mouth every morning.     Yes Historical Provider, MD  glimepiride (AMARYL) 2 MG tablet Take 2 mg by mouth 2 (two) times daily.    Yes Historical Provider, MD  lisinopril (PRINIVIL,ZESTRIL) 5 MG tablet Take 5 mg by mouth  daily.   Yes Historical Provider, MD  loperamide (IMODIUM) 2 MG capsule Take 2 mg by mouth every morning.     Yes Historical Provider, MD  metoprolol (LOPRESSOR) 50 MG tablet Take 50 mg by mouth daily.     Yes Historical Provider, MD  simvastatin (ZOCOR) 10 MG tablet Take 10 mg by mouth at bedtime.     Yes Historical Provider, MD  acetaminophen (TYLENOL) 500 MG tablet Take 500 mg by mouth daily as needed (pain). For pain    Historical Provider, MD  albuterol (VENTOLIN HFA) 108 (90 BASE) MCG/ACT inhaler Inhale 2 puffs into the lungs daily as needed for wheezing or shortness of breath. For rescue    Historical Provider, MD   Triage Vitals: BP 152/127  Pulse 97  Temp(Src) 98.2 F (36.8 C) (Oral)  Resp 20  Ht 4\' 11"  (1.499 m)  Wt 155 lb (70.308 kg)  BMI 31.29 kg/m2  SpO2 96% Physical Exam  Nursing note and vitals reviewed. Constitutional: She is oriented to person, place, and time. She appears well-developed and well-nourished.  HENT:  Head: Normocephalic and atraumatic.  Eyes: EOM are normal.  Neck: Normal range of motion.  Cardiovascular: Normal rate and regular rhythm.   No murmur heard. Radial pulses 2+ bilaterally.  Pulmonary/Chest: Effort normal and breath sounds normal. No respiratory distress. She has no wheezes. She has no rales.  Abdominal: Soft. Bowel sounds are normal. There is no tenderness.  Musculoskeletal: Normal range of motion. She exhibits no edema.  No leg swelling. Movement of right wrist and elbow and all fingers normal. Right shoulder with no deformities. Limited ROM without issue.  Neurological: She is alert and oriented to person, place, and time. No cranial nerve deficit. She exhibits normal muscle tone. Coordination normal.  Normal movement of hands and feet. Sensations intact.  Skin: Skin is warm and dry.  Psychiatric: She has a normal mood and affect. Her behavior is normal.    ED Course  Procedures (including critical care time) DIAGNOSTIC  STUDIES: Oxygen Saturation is 96% on RA, adequate by my interpretation.   COORDINATION OF CARE: 11:50 AM- Will wait for imaging to result and will refer to orthopedics. Pt verbalizes understanding and agrees to plan.  Medications - No data to display  Labs Review Labs Reviewed - No data to display  Imaging Review Dg Shoulder Right  06/20/2014   CLINICAL DATA:  Acute shoulder pain today while bending to pick something up at home felt a pop out of place, indicates that it popped back into place after arriving to the emergency department with relief of pain, personal history of right shoulder dislocation 2 years ago  EXAM: RIGHT SHOULDER - 2+ VIEW  COMPARISON:  05/31/2009  FINDINGS: Moderate acromioclavicular degenerative change. No glenohumeral fracture or dislocation. Mild glenohumeral degenerative change.  IMPRESSION: Radiographically, no acute  findings.   Electronically Signed   By: Skipper Cliche M.D.   On: 06/20/2014 12:10     EKG Interpretation None      MDM   Final diagnoses:  Shoulder subluxation, right, initial encounter    The patient clinically most likely had a subluxed right shoulder. It popped back in right prior to x-ray. Patient's had this happen before and she is usually able to pop it back in. Patient had no fall. She was just reaching to pick something up when it popped out. This is been going on now for a few weeks. Patient will need to follow-up with orthopedics. Patient will be treated with shoulder immobilizer. Referral to Dr. Aline Brochure provided. Patient's arm examination radial pulses 2+ there's no neuro deficits. Good range of motion at wrist elbow and reasonable range of motion at the shoulder was not stressed to the limits due to the recent subluxation. Patient felt much better once he went back in. And that went back and spontaneously.  I personally performed the services described in this documentation, which was scribed in my presence. The recorded  information has been reviewed and is accurate.    Fredia Sorrow, MD 06/20/14 [phone]

## 2014-06-20 NOTE — ED Notes (Signed)
Pt reports leaning down to pick up something and rt shoulder "popped out"; pt states same has happened before, but shoulder popped back in on its own; pt gaurding right shoulder

## 2014-07-02 ENCOUNTER — Encounter: Payer: Self-pay | Admitting: Orthopedic Surgery

## 2014-07-02 ENCOUNTER — Ambulatory Visit (INDEPENDENT_AMBULATORY_CARE_PROVIDER_SITE_OTHER): Payer: Medicare Other | Admitting: Orthopedic Surgery

## 2014-07-02 VITALS — BP 131/86 | Ht 59.0 in | Wt 155.0 lb

## 2014-07-02 DIAGNOSIS — M75101 Unspecified rotator cuff tear or rupture of right shoulder, not specified as traumatic: Secondary | ICD-10-CM | POA: Diagnosis not present

## 2014-07-02 NOTE — Patient Instructions (Signed)
We will schedule MRI for you (not available 11/9 , 11/12, 11/13)

## 2014-07-02 NOTE — Progress Notes (Signed)
Patient ID: Lindsay Cortez, female   DOB: June 20, 1941, 73 y.o.   MRN: [id number] Chief Complaint  Patient presents with  . Shoulder Injury    Right shoulder dislocation, DOI 06/20/14   Patient ID: Lindsay Cortez, female   DOB: November 26, 1940, 73 y.o.   MRN: [id number]  Chief Complaint  Patient presents with  . Shoulder Injury    Right shoulder dislocation, DOI 06/20/14     Lindsay Cortez is a 73 y.o. who presents with a history of dislocation of the right shoulder. However the patient tells me that she's had pain in this shoulder for several years and has been getting subacromial injections when her shoulder is painful and she can't lift her arm. About 3 weeks ago she felt that the shoulder dislocated and reduced then 2 weeks after that she had another dislocation reduction episode x-rays at the hospital were negative there is questionable deformity of the humeral head at the posterior aspect. She now complains of no pain except when it "goes out of socket and she feels a lot of grinding and weakness in the right arm. No previous treatment.  Review of systems weight loss shortness of breath wheezing breathing issues back pain joint pain seasonal allergies otherwise normal  Medical allergies codeine causes her to have nausea    Past Medical History  Diagnosis Date  . Renal disorder   . Gout   . Hypertension   . Asthma   . Diabetes mellitus     x 15 yrs  . Back pain     Past Surgical History  Procedure Laterality Date  . Breast surgery      benign  . Abdominal hysterectomy    . Bladder surgery      bladder tac  . Foot surgery      No family history on file.  Social History History  Substance Use Topics  . Smoking status: Never Smoker   . Smokeless tobacco: Not on file  . Alcohol Use: No    Allergies  Allergen Reactions  . Fentanyl Nausea And Vomiting  . Codeine     Narcotic pain medicine makes her nauseated.  Says was told to only take tylenol due to kidney disease.     Current Outpatient Prescriptions  Medication Sig Dispense Refill  . acetaminophen (TYLENOL) 500 MG tablet Take 500 mg by mouth daily as needed (pain). For pain    . albuterol (PROVENTIL) (2.5 MG/3ML) 0.083% nebulizer solution Take 2.5 mg by nebulization every 4 (four) hours as needed for wheezing.    Marland Kitchen albuterol (VENTOLIN HFA) 108 (90 BASE) MCG/ACT inhaler Inhale 2 puffs into the lungs daily as needed for wheezing or shortness of breath. For rescue    . allopurinol (ZYLOPRIM) 100 MG tablet Take 200 mg by mouth every morning.      Marland Kitchen amLODipine (NORVASC) 5 MG tablet Take 5 mg by mouth daily.    . budesonide-formoterol (SYMBICORT) 80-4.5 MCG/ACT inhaler Inhale 2 puffs into the lungs 2 (two) times daily.     . Calcium Carbonate-Vitamin D (CALCIUM 600 + D PO) Take 1 tablet by mouth daily.      . Cholecalciferol (VITAMIN D) 2000 UNITS CAPS Take 1 capsule by mouth every morning.      Marland Kitchen glimepiride (AMARYL) 2 MG tablet Take 2 mg by mouth 2 (two) times daily.     Marland Kitchen lisinopril (PRINIVIL,ZESTRIL) 5 MG tablet Take 5 mg by mouth daily.    Marland Kitchen loperamide (IMODIUM) 2 MG capsule  Take 2 mg by mouth every morning.      . metoprolol (LOPRESSOR) 50 MG tablet Take 50 mg by mouth daily.      . simvastatin (ZOCOR) 10 MG tablet Take 10 mg by mouth at bedtime.       No current facility-administered medications for this visit.    Review of Systems Review of Systems    Physical Exam Blood pressure 131/86, height 4\' 11"  (1.499 m), weight 155 lb (70.308 kg). Physical Exam  Objective:    General:  alert, cooperative and her overall appearance is normal she is oriented 3 she has an excellent mood she is happy she is ambulatory.  Gait:  Normal.    Left Shoulder Bruising:   absent  Crepitus:  absent  Joint Tenderness:  none  Effusion:   none present           Active ROM:   forward flexion 180/180, extension 45/45, full abduction 180/180, abduction-glenohumeral 90/90  Neer:   negative  Hawkins   negative  Stability:   normal  Apprehension Sign:   negative/negative  Atrophy:   none noted  Strength:  biceps 5/5, triceps 5/5, abduction 5/5, adduction 5/5, external rotation 5/5 with shoulder at side, flexion 5/5, and extension 5/5   Right Shoulder Reveals a negative apprehension sign no tenderness but a distinct grinding sensation when the arm is in 90 of abduction 45 of flexion and is brought back posteriorly. She has weakness in [place] elevation as well I do not detect any skin changes. There is no actual instability that I can detect.  CV pulses are excellent in both arms LYMPH cervical lymph nodes are negative SENSATION reflexes and sensation in the upper extremities are normal and equal  COORDINATION normal extremity coordination  CSPINE EVAL: nontender   Data Reviewed My interpretation of the xrays: 3 views of the shoulder were done at the hospital and all see any major abnormalities on questioning whether or not the posterior humeral head has a defect  Assessment Encounter Diagnosis  Name Primary?  . Right rotator cuff tear Yes    Plan Recommend MRI to evaluate the glenohumeral ligaments as well as rotator cuff for possible chronic tear

## 2014-07-07 DIAGNOSIS — G894 Chronic pain syndrome: Secondary | ICD-10-CM | POA: Diagnosis not present

## 2014-07-07 DIAGNOSIS — M4807 Spinal stenosis, lumbosacral region: Secondary | ICD-10-CM | POA: Diagnosis not present

## 2014-07-07 DIAGNOSIS — M5137 Other intervertebral disc degeneration, lumbosacral region: Secondary | ICD-10-CM | POA: Diagnosis not present

## 2014-07-07 DIAGNOSIS — M545 Low back pain: Secondary | ICD-10-CM | POA: Diagnosis not present

## 2014-07-09 ENCOUNTER — Telehealth: Payer: Self-pay | Admitting: *Deleted

## 2014-07-09 NOTE — Telephone Encounter (Signed)
CALLED PATIENT TO ADVISE OF MRI APPOINTMENT AT [facility] [place], SAT 07/18/14 REGISTER AT 2:55  NO ANSWER, LEFT VM

## 2014-07-16 DIAGNOSIS — Z6829 Body mass index (BMI) 29.0-29.9, adult: Secondary | ICD-10-CM | POA: Diagnosis not present

## 2014-07-16 DIAGNOSIS — E538 Deficiency of other specified B group vitamins: Secondary | ICD-10-CM | POA: Diagnosis not present

## 2014-07-16 DIAGNOSIS — G894 Chronic pain syndrome: Secondary | ICD-10-CM | POA: Diagnosis not present

## 2014-07-16 DIAGNOSIS — E663 Overweight: Secondary | ICD-10-CM | POA: Diagnosis not present

## 2014-07-18 ENCOUNTER — Ambulatory Visit
Admission: RE | Admit: 2014-07-18 | Discharge: 2014-07-18 | Disposition: A | Discharge status: Home / Self Care | Payer: Medicare Other | Source: Ambulatory Visit | Attending: Orthopedic Surgery | Admitting: Orthopedic Surgery

## 2014-07-18 DIAGNOSIS — M19011 Primary osteoarthritis, right shoulder: Secondary | ICD-10-CM | POA: Diagnosis not present

## 2014-07-18 DIAGNOSIS — M75101 Unspecified rotator cuff tear or rupture of right shoulder, not specified as traumatic: Secondary | ICD-10-CM

## 2014-07-18 DIAGNOSIS — M75111 Incomplete rotator cuff tear or rupture of right shoulder, not specified as traumatic: Secondary | ICD-10-CM | POA: Diagnosis not present

## 2014-07-18 DIAGNOSIS — M66811 Spontaneous rupture of other tendons, right shoulder: Secondary | ICD-10-CM | POA: Diagnosis not present

## 2014-07-19 ENCOUNTER — Encounter (HOSPITAL_COMMUNITY): Payer: Self-pay | Admitting: Emergency Medicine

## 2014-07-19 ENCOUNTER — Emergency Department (HOSPITAL_COMMUNITY)
Admission: EM | Admit: 2014-07-19 | Discharge: 2014-07-19 | Disposition: A | Discharge status: Home / Self Care | Payer: Medicare Other | Attending: Emergency Medicine | Admitting: Emergency Medicine

## 2014-07-19 ENCOUNTER — Emergency Department (HOSPITAL_COMMUNITY): Payer: Medicare Other

## 2014-07-19 DIAGNOSIS — Y9289 Other specified places as the place of occurrence of the external cause: Secondary | ICD-10-CM | POA: Insufficient documentation

## 2014-07-19 DIAGNOSIS — S8001XA Contusion of right knee, initial encounter: Secondary | ICD-10-CM | POA: Diagnosis not present

## 2014-07-19 DIAGNOSIS — I10 Essential (primary) hypertension: Secondary | ICD-10-CM | POA: Diagnosis not present

## 2014-07-19 DIAGNOSIS — J45909 Unspecified asthma, uncomplicated: Secondary | ICD-10-CM | POA: Diagnosis not present

## 2014-07-19 DIAGNOSIS — Z87448 Personal history of other diseases of urinary system: Secondary | ICD-10-CM | POA: Insufficient documentation

## 2014-07-19 DIAGNOSIS — Z7951 Long term (current) use of inhaled steroids: Secondary | ICD-10-CM | POA: Diagnosis not present

## 2014-07-19 DIAGNOSIS — S80211A Abrasion, right knee, initial encounter: Secondary | ICD-10-CM | POA: Diagnosis not present

## 2014-07-19 DIAGNOSIS — W010XXA Fall on same level from slipping, tripping and stumbling without subsequent striking against object, initial encounter: Secondary | ICD-10-CM | POA: Diagnosis not present

## 2014-07-19 DIAGNOSIS — Y9389 Activity, other specified: Secondary | ICD-10-CM | POA: Diagnosis not present

## 2014-07-19 DIAGNOSIS — E119 Type 2 diabetes mellitus without complications: Secondary | ICD-10-CM | POA: Diagnosis not present

## 2014-07-19 DIAGNOSIS — S8991XA Unspecified injury of right lower leg, initial encounter: Secondary | ICD-10-CM | POA: Diagnosis present

## 2014-07-19 DIAGNOSIS — M25461 Effusion, right knee: Secondary | ICD-10-CM | POA: Diagnosis not present

## 2014-07-19 DIAGNOSIS — M109 Gout, unspecified: Secondary | ICD-10-CM | POA: Insufficient documentation

## 2014-07-19 DIAGNOSIS — Y998 Other external cause status: Secondary | ICD-10-CM | POA: Insufficient documentation

## 2014-07-19 DIAGNOSIS — Z79899 Other long term (current) drug therapy: Secondary | ICD-10-CM | POA: Insufficient documentation

## 2014-07-19 DIAGNOSIS — Z8719 Personal history of other diseases of the digestive system: Secondary | ICD-10-CM | POA: Insufficient documentation

## 2014-07-19 DIAGNOSIS — M25569 Pain in unspecified knee: Secondary | ICD-10-CM

## 2014-07-19 HISTORY — DX: Cyst of pancreas: K86.2

## 2014-07-19 HISTORY — DX: Unspecified dislocation of unspecified shoulder joint, initial encounter: S43.006A

## 2014-07-19 NOTE — ED Notes (Signed)
Per patient tripped and fell over rug in [place] today. Per patient landed on right knee. Denies hitting head or LOC. Patient c/o right knee pain and swelling.

## 2014-07-19 NOTE — ED Provider Notes (Signed)
CSN: [id number]     Arrival date & time 07/19/14  1425 History  This chart was scribed for Kem Parkinson, PA-C working with Maudry Diego, MD by Edison Simon, ED Scribe. This patient was seen in room APFT23/APFT23 and the patient's care was started at 4:09 PM.    Chief Complaint  Patient presents with  . Fall   The history is provided by the patient. No language interpreter was used.    HPI Comments: Lindsay Cortez is a 73 y.o. female who presents to the Emergency Department complaining of right knee pain and swelling status post fall after tripping on a rug coming out of [place] and landing on her right knee on cement PTA. She was able to rise with assistance. She denies pain or injury elsewhere. She denies history of arthritis or procedures in that knee. She states she felt she had twisted it 1 year ago and it was evaluated by Dr. Aline Brochure, but he did not feel any intervention was necessary. She denies use of blood thinners. She also denies numbness, weakness, hip pain or ankle pain.  She has applied ice with some relief.  Past Medical History  Diagnosis Date  . Renal disorder   . Gout   . Hypertension   . Asthma   . Diabetes mellitus     x 15 yrs  . Back pain   . Pancreatic cyst   . Shoulder dislocation    Past Surgical History  Procedure Laterality Date  . Breast surgery      benign  . Abdominal hysterectomy    . Bladder surgery      bladder tac  . Foot surgery     Family History  Problem Relation Age of Onset  . Cancer Mother    History  Substance Use Topics  . Smoking status: Never Smoker   . Smokeless tobacco: Never Used  . Alcohol Use: No   OB History    Gravida Para Term Preterm AB TAB SAB Ectopic Multiple Living   2 2 2       2      Review of Systems  Constitutional: Negative for fever and chills.  Genitourinary: Negative for dysuria and difficulty urinating.  Musculoskeletal: Positive for joint swelling and arthralgias (right knee). Negative for back  pain and neck pain.  Skin: Negative for color change and wound.  All other systems reviewed and are negative.     Allergies  Fentanyl and Codeine  Home Medications   Prior to Admission medications   Medication Sig Start Date End Date Taking? Authorizing Provider  acetaminophen (TYLENOL) 500 MG tablet Take 500 mg by mouth daily as needed (pain). For pain   Yes Historical Provider, MD  albuterol (PROVENTIL) (2.5 MG/3ML) 0.083% nebulizer solution Take 2.5 mg by nebulization every 4 (four) hours as needed for wheezing.   Yes Historical Provider, MD  allopurinol (ZYLOPRIM) 100 MG tablet Take 200 mg by mouth every morning.     Yes Historical Provider, MD  amLODipine (NORVASC) 5 MG tablet Take 5 mg by mouth daily.   Yes Historical Provider, MD  budesonide-formoterol (SYMBICORT) 80-4.5 MCG/ACT inhaler Inhale 2 puffs into the lungs 2 (two) times daily.    Yes Historical Provider, MD  Calcium Carbonate-Vitamin D (CALCIUM 600 + D PO) Take 1 tablet by mouth daily.     Yes Historical Provider, MD  Cholecalciferol (VITAMIN D) 2000 UNITS CAPS Take 1 capsule by mouth every morning.     Yes Historical Provider, MD  glimepiride (AMARYL) 2 MG tablet Take 2 mg by mouth 2 (two) times daily.    Yes Historical Provider, MD  lisinopril (PRINIVIL,ZESTRIL) 5 MG tablet Take 5 mg by mouth daily.   Yes Historical Provider, MD  loperamide (IMODIUM) 2 MG capsule Take 2 mg by mouth every morning.     Yes Historical Provider, MD  metoprolol (LOPRESSOR) 50 MG tablet Take 50 mg by mouth daily.     Yes Historical Provider, MD  simvastatin (ZOCOR) 10 MG tablet Take 10 mg by mouth at bedtime.     Yes Historical Provider, MD  albuterol (VENTOLIN HFA) 108 (90 BASE) MCG/ACT inhaler Inhale 2 puffs into the lungs daily as needed for wheezing or shortness of breath. For rescue    Historical Provider, MD   BP 104/66 mmHg  Pulse 90  Temp(Src) 98.1 F (36.7 C)  Resp 18  Ht 4\' 11"  (1.499 m)  Wt 150 lb (68.04 kg)  BMI 30.28 kg/m2   SpO2 100% Physical Exam  Constitutional: She is oriented to person, place, and time. She appears well-developed and well-nourished. No distress.  HENT:  Head: Normocephalic and atraumatic.  Eyes: Conjunctivae are normal.  Neck: Normal range of motion. Neck supple.  Cardiovascular: Normal rate, regular rhythm, normal heart sounds and intact distal pulses.   No murmur heard. Pulmonary/Chest: Effort normal and breath sounds normal. No respiratory distress. She exhibits no tenderness.  Abdominal: Soft. She exhibits no distension. There is no tenderness. There is no rebound and no guarding.  Musculoskeletal: Normal range of motion. She exhibits edema and tenderness.  Localized tenderness anterior right knee Mild ecchymosis present at the patella tendon No bony deformity or step-off Patient has full ROM.  Compartments of the right knee are soft.  Neurological: She is alert and oriented to person, place, and time. She exhibits normal muscle tone. Coordination normal.  Skin: Skin is warm and dry. No erythema.  Psychiatric: She has a normal mood and affect.  Nursing note and vitals reviewed.   ED Course  Procedures (including critical care time)  DIAGNOSTIC STUDIES: Oxygen Saturation is 100% on room air, normal by my interpretation.    COORDINATION OF CARE: 4:17 PM Discussed her x-ray results that show some swelling. Discussed treatment plan with patient at beside, including an ace wrap., ice pack, and elevation. She states she has a brace and a walker at home. She declines pain medication. The patient agrees with the plan and has no further questions at this time. She has an appointment to see Dr. Aline Brochure on 12/1.  Labs Review Labs Reviewed - No data to display  Imaging Review Mr Shoulder Right Wo Contrast  07/18/2014   CLINICAL DATA:  Right shoulder pain and highly limited range of motion of the past 5 weeks. Instability.  EXAM: MRI OF THE RIGHT SHOULDER WITHOUT CONTRAST  TECHNIQUE:  Multiplanar, multisequence MR imaging of the shoulder was performed. No intravenous contrast was administered.  COMPARISON:  06/20/2014 radiographs  FINDINGS: Despite efforts by the technologist and patient, motion artifact is present on today's exam and could not be eliminated. This reduces exam sensitivity and specificity.  Rotator cuff: Full-thickness full width ruptures of the supraspinatus, infraspinatus, and subscapularis tendons are retracted back to the plane of the glenoid. Teres minor tendon intact.  Muscles: Muscular atrophy of the supraspinatus, infraspinatus, and subscapularis muscles.  Biceps long head: Nonvisualization of the intra-articular segment, likely ruptured.  Acromioclavicular Joint: Prominent degenerative AC joint arthropathy, with a 9 mm fluid-filled gap, associated spurring,  and subcortical marrow edema. Fluid may communicate with the joint and subacromial subdeltoid bursa on image 8 of series 5. Subacromial morphology is type 2 (curved).  Glenohumeral Joint: Mild degenerative chondral thinning along the humeral head. Degenerative spurring along the rim of the glenoid. Joint effusion freely communicates with the subacromial subdeltoid bursa. Mild synovitis in the joint and bursa.  Labrum: Superior labrum is blunted and indistinct, and probably torn.  Bones:  Intact except as noted above.  IMPRESSION: 1. Chronic retracted tears of the supraspinatus, infraspinatus, and subscapularis tendons with associated diffuse muscular atrophy. The teres minor tendon remains intact. 2. Ruptured long head of the biceps. 3. Prominent degenerative AC joint arthropathy, with mild separation, and the fluid-filled gap probably communicating with the joint and subacromial subdeltoid bursa. Glenohumeral joint effusion with mild synovitis. 4. Glenohumeral scratch that mild glenohumeral spurring. Superior labrum is blunted and probably torn. 5. In reviewing the prior shoulder radiographs, the possibility of  airspace opacity at the right lung base is raised. This probably reflects the chronic scarring which is been in this vicinity on prior exams, but consider followup chest radiography to ensure that there is no developing process.  These results will be called to the ordering clinician or representative by the Radiologist Assistant, and communication documented in the PACS or zVision Dashboard.   Electronically Signed   By: Sherryl Barters M.D.   On: 07/18/2014 17:28   Dg Knee Complete 4 Views Right  07/19/2014   CLINICAL DATA:  Fall with abrasions to the right knee.  EXAM: RIGHT KNEE - COMPLETE 4+ VIEW  COMPARISON:  09/02/2013  FINDINGS: Significant interval progression of osteoarthritis with loss of articular space laterally in the knee, and lateral compartmental subcortical eburnation. Similar degree of patellar spurring. Small knee effusion. Atherosclerosis.  IMPRESSION: 1. Unusual degree of progression of articular cartilage thinning and subcortical eburnation in the lateral compartment over the past 10 months. This accelerated degeneration could indicate underlying internal derangement; a followup non-urgent MRI should be considered. 2. Small knee effusion. 3. Atherosclerosis. 4. Patellofemoral spurring.   Electronically Signed   By: Sherryl Barters M.D.   On: 07/19/2014 15:12      EKG Interpretation None      MDM   Final diagnoses:  Contusion, knee, right, initial encounter    Pt is well appearing.  NV intact.  Compartments soft.  Contusion of the knee, but I have also discussed possible ligamentous injury.  She agrees to elevate, ice and close orthopedic f/u and use of her walker.    I personally performed the services described in this documentation, which was scribed in my presence. The recorded information has been reviewed and is accurate.    Whyatt Klinger L. Vanessa [place], PA-C 07/21/14 2158  Maudry Diego, MD 07/27/14 [phone]

## 2014-07-19 NOTE — Discharge Instructions (Signed)

## 2014-07-28 ENCOUNTER — Ambulatory Visit (INDEPENDENT_AMBULATORY_CARE_PROVIDER_SITE_OTHER): Payer: Medicare Other | Admitting: Orthopedic Surgery

## 2014-07-28 ENCOUNTER — Encounter: Payer: Self-pay | Admitting: Orthopedic Surgery

## 2014-07-28 VITALS — Resp 16 | Ht 59.0 in | Wt 150.0 lb

## 2014-07-28 DIAGNOSIS — M75101 Unspecified rotator cuff tear or rupture of right shoulder, not specified as traumatic: Secondary | ICD-10-CM

## 2014-07-28 NOTE — Progress Notes (Signed)
Patient ID: Lindsay Cortez, female   DOB: 07/27/1941, 73 y.o.   MRN: [id number] Chief Complaint  Patient presents with  . Follow-up    review MRI results rt shoulder   Ht 4\' 11"  (1.499 m)  Wt 150 lb (68.04 kg)  BMI 30.28 kg/m2  The patient returns for evaluation after MRI of her right shoulder she had what sounds like dislocation episodes and this was again reduced in the ER. Her MRI shows chronic rotator cuff tearing feeling humeral arthritis before meals joint arthritis ruptured long head of biceps tendon  I reviewed the report I agree with the report is dictated on 07/18/2014  The patient comes in with improvement in overall pain  Review of systems no recurrent dislocation  Examination shows the patient does have approximately 110 of forward elevation obviously coming from the deltoid no palpable tenderness decreased internal rotation the shoulder remain stable in apprehension she has weakness of rotator cuff normal deltoid function scans intact good distal pulse normal sensation  Recommend normal activities  Okay to come in for injection as needed  Recommend no operative treatment on this retracted chronic atrophied rotator cuff tear.

## 2014-08-12 ENCOUNTER — Encounter (INDEPENDENT_AMBULATORY_CARE_PROVIDER_SITE_OTHER): Payer: Self-pay

## 2014-08-17 DIAGNOSIS — E663 Overweight: Secondary | ICD-10-CM | POA: Diagnosis not present

## 2014-08-17 DIAGNOSIS — E1129 Type 2 diabetes mellitus with other diabetic kidney complication: Secondary | ICD-10-CM | POA: Diagnosis not present

## 2014-08-17 DIAGNOSIS — Z6829 Body mass index (BMI) 29.0-29.9, adult: Secondary | ICD-10-CM | POA: Diagnosis not present

## 2014-08-18 DIAGNOSIS — E119 Type 2 diabetes mellitus without complications: Secondary | ICD-10-CM | POA: Diagnosis not present

## 2014-08-31 ENCOUNTER — Emergency Department (HOSPITAL_COMMUNITY)
Admission: EM | Admit: 2014-08-31 | Discharge: 2014-08-31 | Disposition: A | Discharge status: Home / Self Care | Payer: Medicare Other | Source: Home / Self Care | Attending: Emergency Medicine | Admitting: Emergency Medicine

## 2014-08-31 ENCOUNTER — Encounter (HOSPITAL_COMMUNITY): Payer: Self-pay | Admitting: Emergency Medicine

## 2014-08-31 DIAGNOSIS — R5081 Fever presenting with conditions classified elsewhere: Secondary | ICD-10-CM | POA: Diagnosis not present

## 2014-08-31 DIAGNOSIS — Z79899 Other long term (current) drug therapy: Secondary | ICD-10-CM | POA: Insufficient documentation

## 2014-08-31 DIAGNOSIS — Z792 Long term (current) use of antibiotics: Secondary | ICD-10-CM | POA: Insufficient documentation

## 2014-08-31 DIAGNOSIS — E119 Type 2 diabetes mellitus without complications: Secondary | ICD-10-CM

## 2014-08-31 DIAGNOSIS — J159 Unspecified bacterial pneumonia: Secondary | ICD-10-CM | POA: Insufficient documentation

## 2014-08-31 DIAGNOSIS — I10 Essential (primary) hypertension: Secondary | ICD-10-CM | POA: Insufficient documentation

## 2014-08-31 DIAGNOSIS — J181 Lobar pneumonia, unspecified organism: Secondary | ICD-10-CM | POA: Diagnosis not present

## 2014-08-31 DIAGNOSIS — J189 Pneumonia, unspecified organism: Secondary | ICD-10-CM | POA: Diagnosis not present

## 2014-08-31 DIAGNOSIS — J441 Chronic obstructive pulmonary disease with (acute) exacerbation: Secondary | ICD-10-CM

## 2014-08-31 DIAGNOSIS — Z87448 Personal history of other diseases of urinary system: Secondary | ICD-10-CM | POA: Insufficient documentation

## 2014-08-31 DIAGNOSIS — J45901 Unspecified asthma with (acute) exacerbation: Secondary | ICD-10-CM | POA: Insufficient documentation

## 2014-08-31 DIAGNOSIS — R0602 Shortness of breath: Secondary | ICD-10-CM | POA: Diagnosis not present

## 2014-08-31 LAB — COMPREHENSIVE METABOLIC PANEL
ALBUMIN: 3.9 g/dL (ref 3.5–5.2)
ALK PHOS: 80 U/L (ref 39–117)
ALT: 16 U/L (ref 0–35)
ANION GAP: 7 (ref 5–15)
AST: 23 U/L (ref 0–37)
BILIRUBIN TOTAL: 0.7 mg/dL (ref 0.3–1.2)
BUN: 28 mg/dL — AB (ref 6–23)
CO2: 24 mmol/L (ref 19–32)
Calcium: 9.4 mg/dL (ref 8.4–10.5)
Chloride: 108 mEq/L (ref 96–112)
Creatinine, Ser: 1.57 mg/dL — ABNORMAL HIGH (ref 0.50–1.10)
GFR calc Af Amer: 36 mL/min — ABNORMAL LOW (ref >90)
GFR calc non Af Amer: 31 mL/min — ABNORMAL LOW (ref >90)
Glucose, Bld: 103 mg/dL — ABNORMAL HIGH (ref 70–99)
Potassium: 3.9 mmol/L (ref 3.5–5.1)
SODIUM: 139 mmol/L (ref 135–145)
Total Protein: 6.7 g/dL (ref 6.0–8.3)

## 2014-08-31 LAB — CBC WITH DIFFERENTIAL/PLATELET
Basophils Absolute: 0 10*3/uL (ref 0.0–0.1)
Basophils Relative: 0 % (ref 0–1)
Eosinophils Absolute: 0.2 10*3/uL (ref 0.0–0.7)
Eosinophils Relative: 3 % (ref 0–5)
HCT: 37.6 % (ref 36.0–46.0)
HEMOGLOBIN: 12.2 g/dL (ref 12.0–15.0)
LYMPHS ABS: 1.5 10*3/uL (ref 0.7–4.0)
Lymphocytes Relative: 17 % (ref 12–46)
MCH: 30.9 pg (ref 26.0–34.0)
MCHC: 32.4 g/dL (ref 30.0–36.0)
MCV: 95.2 fL (ref 78.0–100.0)
MONOS PCT: 17 % — AB (ref 3–12)
Monocytes Absolute: 1.5 10*3/uL — ABNORMAL HIGH (ref 0.1–1.0)
NEUTROS ABS: 6 10*3/uL (ref 1.7–7.7)
NEUTROS PCT: 63 % (ref 43–77)
Platelets: 231 10*3/uL (ref 150–400)
RBC: 3.95 MIL/uL (ref 3.87–5.11)
RDW: 14.9 % (ref 11.5–15.5)
WBC: 9.3 10*3/uL (ref 4.0–10.5)

## 2014-08-31 MED ORDER — LEVOFLOXACIN 750 MG PO TABS: 750.0000 mg | ORAL_TABLET | ORAL | Status: DC

## 2014-08-31 MED ORDER — LEVOFLOXACIN 750 MG PO TABS
750.0000 mg | ORAL_TABLET | Freq: Once | ORAL | Status: AC
  Administered 2014-08-31: 750 mg via ORAL
  Filled 2014-08-31: qty 1

## 2014-08-31 NOTE — ED Notes (Signed)
PT was started on zpack on 08/27/14 and had cough and low grade temp of 100.0 last night. PT was seen at urgent care and sent to ED for eval d/t xray shown RLL pneumonia. PT c/o SOB on exertion and productive cough with white sputum.

## 2014-08-31 NOTE — Discharge Instructions (Signed)
Please take the Levaquin every other day - next dose will be on the 6th, then the 8th, then the 10th.  I discussed this with the pharmacist.  I agree that you have a right lung abnormality that is most likely an early pneumonia - this should be rechecked in 2 weeks to ensure that it has resolved.  Please call your doctor for a followup appointment within 24-48 hours. When you talk to your doctor please let them know that you were seen in the emergency department and have them acquire all of your records so that they can discuss the findings with you and formulate a treatment plan to fully care for your new and ongoing problems.

## 2014-08-31 NOTE — ED Notes (Signed)
Pt sent from Dr. Shona Needles office and was diagnosed with pneumonia.  Reports pt also has chronic kidney disease and diabetes.

## 2014-08-31 NOTE — ED Provider Notes (Signed)
CSN: [id number]     Arrival date & time 08/31/14  1410 History  This chart was scribed for Lindsay Acosta, MD by Stephania Fragmin, ED Scribe. This patient was seen in room APA03/APA03 and the patient's care was started at 3:03 PM.   Chief Complaint  Patient presents with  . Pneumonia   The history is provided by the patient and medical records. No language interpreter was used.      HPI Comments: Lindsay Cortez is a 74 y.o. female with a history of DM, hypertensin, hyperlipidemia, and chronic real disorder who presents to the Emergency Department complaining of a persistent productive cough for the past week. She complains of associated SOB and low grade fever (temp of 100). Patient was seen at urgent care and sent to the ED for evalation following an XR that showed RLL pneumonia, per nursing notes. She just finished a Z-pak today, and has seen minimal improvement over the course of treatment. She has used occasional albuterol MDI tx at home without notice of improvement.  She takes glimepiride for DM. Patient denies nausea, vomiting, diarrhea, chest pain, loss of appetite, and urinary symptoms. Dr. Gerarda Fraction is her PCP.  Sx are moderate at this time.   Past Medical History  Diagnosis Date  . Renal disorder   . Gout   . Hypertension   . Asthma   . Diabetes mellitus     x 15 yrs  . Back pain   . Pancreatic cyst   . Shoulder dislocation    Past Surgical History  Procedure Laterality Date  . Breast surgery      benign  . Abdominal hysterectomy    . Bladder surgery      bladder tac  . Foot surgery     Family History  Problem Relation Age of Onset  . Cancer Mother    History  Substance Use Topics  . Smoking status: Never Smoker   . Smokeless tobacco: Never Used  . Alcohol Use: No   OB History    Gravida Para Term Preterm AB TAB SAB Ectopic Multiple Living   2 2 2       2      Review of Systems  Constitutional: Positive for fever. Negative for appetite change.  Respiratory:  Positive for cough and shortness of breath.   Cardiovascular: Negative for chest pain.  Gastrointestinal: Negative for nausea, vomiting and diarrhea.  Genitourinary: Negative for dysuria, frequency, hematuria, decreased urine volume and difficulty urinating.  All other systems reviewed and are negative.     Allergies  Fentanyl and Codeine  Home Medications   Prior to Admission medications   Medication Sig Start Date End Date Taking? Authorizing Provider  acetaminophen (TYLENOL) 500 MG tablet Take 500 mg by mouth daily as needed (pain). For pain   Yes Historical Provider, MD  albuterol (PROVENTIL) (2.5 MG/3ML) 0.083% nebulizer solution Take 2.5 mg by nebulization every 4 (four) hours as needed for wheezing.   Yes Historical Provider, MD  albuterol (VENTOLIN HFA) 108 (90 BASE) MCG/ACT inhaler Inhale 2 puffs into the lungs daily as needed for wheezing or shortness of breath. For rescue   Yes Historical Provider, MD  allopurinol (ZYLOPRIM) 100 MG tablet Take 200 mg by mouth every morning.     Yes Historical Provider, MD  amLODipine (NORVASC) 5 MG tablet Take 5 mg by mouth daily.   Yes Historical Provider, MD  budesonide-formoterol (SYMBICORT) 80-4.5 MCG/ACT inhaler Inhale 2 puffs into the lungs 2 (two) times daily.  Yes Historical Provider, MD  Calcium Carbonate-Vitamin D (CALCIUM 600 + D PO) Take 1 tablet by mouth daily.     Yes Historical Provider, MD  Cholecalciferol (VITAMIN D) 2000 UNITS CAPS Take 1 capsule by mouth every morning.     Yes Historical Provider, MD  glimepiride (AMARYL) 2 MG tablet Take 2-4 mg by mouth 2 (two) times daily. 2 in the morning and 1 at bedtime   Yes Historical Provider, MD  lisinopril (PRINIVIL,ZESTRIL) 5 MG tablet Take 5 mg by mouth daily.   Yes Historical Provider, MD  loperamide (IMODIUM) 2 MG capsule Take 2 mg by mouth every morning.     Yes Historical Provider, MD  metoprolol (LOPRESSOR) 50 MG tablet Take 50 mg by mouth daily.     Yes Historical Provider,  MD  simvastatin (ZOCOR) 10 MG tablet Take 10 mg by mouth at bedtime.     Yes Historical Provider, MD  levofloxacin (LEVAQUIN) 750 MG tablet Take 1 tablet (750 mg total) by mouth every other day. 08/31/14 09/06/14  Lindsay Acosta, MD   BP 112/63 mmHg  Pulse 102  Temp(Src) 98.3 F (36.8 C) (Oral)  Resp 22  Ht 4\' 11"  (1.499 m)  Wt 153 lb (69.4 kg)  BMI 30.89 kg/m2  SpO2 97% Physical Exam  Constitutional: She appears well-developed and well-nourished. No distress.  HENT:  Head: Normocephalic and atraumatic.  Mouth/Throat: Oropharynx is clear and moist. No oropharyngeal exudate.  Eyes: Conjunctivae and EOM are normal. Pupils are equal, round, and reactive to light. Right eye exhibits no discharge. Left eye exhibits no discharge. No scleral icterus.  Neck: Normal range of motion. Neck supple. No JVD present. No thyromegaly present.  Cardiovascular: Normal rate, regular rhythm, normal heart sounds and intact distal pulses.  Exam reveals no gallop and no friction rub.   No murmur heard. Pulmonary/Chest: Effort normal. No respiratory distress. She has no wheezes. She has rales.  Scattered rales. Patient speaks in full sentences. No respiratory distress.  Abdominal: Soft. Bowel sounds are normal. She exhibits no distension and no mass. There is no tenderness.  Musculoskeletal: Normal range of motion. She exhibits no edema or tenderness.  Lymphadenopathy:    She has no cervical adenopathy.  Neurological: She is alert. Coordination normal.  Skin: Skin is warm and dry. No rash noted. No erythema.  Psychiatric: She has a normal mood and affect. Her behavior is normal.  Nursing note and vitals reviewed.   ED Course  Procedures (including critical care time)  DIAGNOSTIC STUDIES: Oxygen Saturation is 97% on room air, normal by my interpretation.    COORDINATION OF CARE: 3:06 PM - Discussed treatment plan with pt at bedside which includes antibiotic medication and pt agreed to plan.  3:44 PM -  I discussed with pharmacist about required dosage for Levaquin. Otherwise, patient looks stable for discharge.   Labs Review Labs Reviewed  CBC WITH DIFFERENTIAL - Abnormal; Notable for the following:    Monocytes Relative 17 (*)    Monocytes Absolute 1.5 (*)    All other components within normal limits  COMPREHENSIVE METABOLIC PANEL - Abnormal; Notable for the following:    Glucose, Bld 103 (*)    BUN 28 (*)    Creatinine, Ser 1.57 (*)    GFR calc non Af Amer 31 (*)    GFR calc Af Amer 36 (*)    All other components within normal limits    Imaging Review No results found.    MDM   Final  diagnoses:  CAP (community acquired pneumonia)    Pt informed and is amenable to tx plan.  I agree after personally viewing her 2 view PA and Lateral CXR that there is likely early CAP in the RML.  I compared this to CXR from 2013 and found no other changes.  Pt encouraged to have f/u CXR in 2 weeks   Levaquin Cr baseline No Leukocytosis  Meds given in ED:  Medications  levofloxacin (LEVAQUIN) tablet 750 mg (750 mg Oral Given 08/31/14 1550)    New Prescriptions   LEVOFLOXACIN (LEVAQUIN) 750 MG TABLET    Take 1 tablet (750 mg total) by mouth every other day.    I personally performed the services described in this documentation, which was scribed in my presence. The recorded information has been reviewed and is accurate.        Lindsay Acosta, MD 08/31/14 [phone]

## 2014-09-01 ENCOUNTER — Inpatient Hospital Stay (HOSPITAL_COMMUNITY)
Admission: EM | Admit: 2014-09-01 | Discharge: 2014-09-05 | DRG: 190 | Disposition: A | Discharge status: Home Health | Payer: Medicare Other | Attending: Family Medicine | Admitting: Family Medicine

## 2014-09-01 ENCOUNTER — Emergency Department (HOSPITAL_COMMUNITY): Payer: Medicare Other

## 2014-09-01 ENCOUNTER — Encounter (HOSPITAL_COMMUNITY): Payer: Self-pay | Admitting: Emergency Medicine

## 2014-09-01 DIAGNOSIS — J441 Chronic obstructive pulmonary disease with (acute) exacerbation: Secondary | ICD-10-CM | POA: Diagnosis present

## 2014-09-01 DIAGNOSIS — J9811 Atelectasis: Secondary | ICD-10-CM | POA: Diagnosis not present

## 2014-09-01 DIAGNOSIS — E1165 Type 2 diabetes mellitus with hyperglycemia: Secondary | ICD-10-CM | POA: Diagnosis present

## 2014-09-01 DIAGNOSIS — I129 Hypertensive chronic kidney disease with stage 1 through stage 4 chronic kidney disease, or unspecified chronic kidney disease: Secondary | ICD-10-CM | POA: Diagnosis present

## 2014-09-01 DIAGNOSIS — J45901 Unspecified asthma with (acute) exacerbation: Secondary | ICD-10-CM | POA: Diagnosis present

## 2014-09-01 DIAGNOSIS — J9601 Acute respiratory failure with hypoxia: Secondary | ICD-10-CM | POA: Diagnosis not present

## 2014-09-01 DIAGNOSIS — N179 Acute kidney failure, unspecified: Secondary | ICD-10-CM | POA: Diagnosis present

## 2014-09-01 DIAGNOSIS — Z792 Long term (current) use of antibiotics: Secondary | ICD-10-CM

## 2014-09-01 DIAGNOSIS — Z809 Family history of malignant neoplasm, unspecified: Secondary | ICD-10-CM | POA: Diagnosis not present

## 2014-09-01 DIAGNOSIS — Z825 Family history of asthma and other chronic lower respiratory diseases: Secondary | ICD-10-CM | POA: Diagnosis not present

## 2014-09-01 DIAGNOSIS — E860 Dehydration: Secondary | ICD-10-CM | POA: Diagnosis present

## 2014-09-01 DIAGNOSIS — E11649 Type 2 diabetes mellitus with hypoglycemia without coma: Secondary | ICD-10-CM | POA: Diagnosis not present

## 2014-09-01 DIAGNOSIS — R0902 Hypoxemia: Secondary | ICD-10-CM | POA: Diagnosis not present

## 2014-09-01 DIAGNOSIS — Z79899 Other long term (current) drug therapy: Secondary | ICD-10-CM

## 2014-09-01 DIAGNOSIS — J449 Chronic obstructive pulmonary disease, unspecified: Secondary | ICD-10-CM | POA: Insufficient documentation

## 2014-09-01 DIAGNOSIS — E119 Type 2 diabetes mellitus without complications: Secondary | ICD-10-CM

## 2014-09-01 DIAGNOSIS — R1110 Vomiting, unspecified: Secondary | ICD-10-CM | POA: Diagnosis not present

## 2014-09-01 DIAGNOSIS — N183 Chronic kidney disease, stage 3 unspecified: Secondary | ICD-10-CM

## 2014-09-01 DIAGNOSIS — R0602 Shortness of breath: Secondary | ICD-10-CM | POA: Diagnosis not present

## 2014-09-01 DIAGNOSIS — R112 Nausea with vomiting, unspecified: Secondary | ICD-10-CM | POA: Diagnosis not present

## 2014-09-01 DIAGNOSIS — E118 Type 2 diabetes mellitus with unspecified complications: Secondary | ICD-10-CM | POA: Diagnosis not present

## 2014-09-01 DIAGNOSIS — N189 Chronic kidney disease, unspecified: Secondary | ICD-10-CM

## 2014-09-01 HISTORY — DX: Type 2 diabetes mellitus without complications: E11.9

## 2014-09-01 HISTORY — DX: Pneumonia, unspecified organism: J18.9

## 2014-09-01 HISTORY — DX: Chronic obstructive pulmonary disease, unspecified: J44.9

## 2014-09-01 HISTORY — DX: Unspecified osteoarthritis, unspecified site: M19.90

## 2014-09-01 LAB — CBC WITH DIFFERENTIAL/PLATELET
Basophils Absolute: 0 10*3/uL (ref 0.0–0.1)
Basophils Relative: 0 % (ref 0–1)
EOS ABS: 0 10*3/uL (ref 0.0–0.7)
EOS PCT: 0 % (ref 0–5)
HCT: 38.3 % (ref 36.0–46.0)
HEMOGLOBIN: 12.6 g/dL (ref 12.0–15.0)
Lymphocytes Relative: 9 % — ABNORMAL LOW (ref 12–46)
Lymphs Abs: 0.9 10*3/uL (ref 0.7–4.0)
MCH: 31.2 pg (ref 26.0–34.0)
MCHC: 32.9 g/dL (ref 30.0–36.0)
MCV: 94.8 fL (ref 78.0–100.0)
MONO ABS: 0.7 10*3/uL (ref 0.1–1.0)
Monocytes Relative: 7 % (ref 3–12)
Neutro Abs: 8.1 10*3/uL — ABNORMAL HIGH (ref 1.7–7.7)
Neutrophils Relative %: 84 % — ABNORMAL HIGH (ref 43–77)
Platelets: 229 10*3/uL (ref 150–400)
RBC: 4.04 MIL/uL (ref 3.87–5.11)
RDW: 15 % (ref 11.5–15.5)
WBC: 9.7 10*3/uL (ref 4.0–10.5)

## 2014-09-01 LAB — COMPREHENSIVE METABOLIC PANEL
ALK PHOS: 82 U/L (ref 39–117)
ALT: 17 U/L (ref 0–35)
AST: 24 U/L (ref 0–37)
Albumin: 4.1 g/dL (ref 3.5–5.2)
Anion gap: 11 (ref 5–15)
BUN: 29 mg/dL — ABNORMAL HIGH (ref 6–23)
CHLORIDE: 104 meq/L (ref 96–112)
CO2: 23 mmol/L (ref 19–32)
CREATININE: 1.82 mg/dL — AB (ref 0.50–1.10)
Calcium: 9.5 mg/dL (ref 8.4–10.5)
GFR calc Af Amer: 30 mL/min — ABNORMAL LOW (ref >90)
GFR, EST NON AFRICAN AMERICAN: 26 mL/min — AB (ref >90)
GLUCOSE: 233 mg/dL — AB (ref 70–99)
POTASSIUM: 4.2 mmol/L (ref 3.5–5.1)
Sodium: 138 mmol/L (ref 135–145)
Total Bilirubin: 0.9 mg/dL (ref 0.3–1.2)
Total Protein: 7 g/dL (ref 6.0–8.3)

## 2014-09-01 LAB — URINALYSIS, ROUTINE W REFLEX MICROSCOPIC
Bilirubin Urine: NEGATIVE
GLUCOSE, UA: NEGATIVE mg/dL
Hgb urine dipstick: NEGATIVE
Leukocytes, UA: NEGATIVE
Nitrite: NEGATIVE
Protein, ur: NEGATIVE mg/dL
Specific Gravity, Urine: >1.03 — ABNORMAL HIGH (ref 1.005–1.030)
UROBILINOGEN UA: 0.2 mg/dL (ref 0.0–1.0)
pH: 5.5 (ref 5.0–8.0)

## 2014-09-01 LAB — LIPASE, BLOOD: Lipase: 25 U/L (ref 11–59)

## 2014-09-01 LAB — GLUCOSE, CAPILLARY
GLUCOSE-CAPILLARY: 85 mg/dL (ref 70–99)
Glucose-Capillary: 133 mg/dL — ABNORMAL HIGH (ref 70–99)

## 2014-09-01 MED ORDER — GLIMEPIRIDE 2 MG PO TABS
2.0000 mg | ORAL_TABLET | Freq: Every day | ORAL | Status: DC
  Administered 2014-09-01 – 2014-09-03 (×3): 2 mg via ORAL
  Filled 2014-09-01 (×4): qty 1

## 2014-09-01 MED ORDER — DEXTROSE-NACL 5-0.45 % IV SOLN: INTRAVENOUS | Status: DC

## 2014-09-01 MED ORDER — SIMVASTATIN 10 MG PO TABS
10.0000 mg | ORAL_TABLET | Freq: Every day | ORAL | Status: DC
  Administered 2014-09-01 – 2014-09-04 (×4): 10 mg via ORAL
  Filled 2014-09-01 (×4): qty 1

## 2014-09-01 MED ORDER — BUDESONIDE-FORMOTEROL FUMARATE 80-4.5 MCG/ACT IN AERO
2.0000 | INHALATION_SPRAY | Freq: Two times a day (BID) | RESPIRATORY_TRACT | Status: DC
  Administered 2014-09-02: 2 via RESPIRATORY_TRACT
  Filled 2014-09-01: qty 6.9

## 2014-09-01 MED ORDER — DEXTROSE 5 % IV SOLN
1.0000 g | Freq: Once | INTRAVENOUS | Status: AC
  Administered 2014-09-01: 1 g via INTRAVENOUS
  Filled 2014-09-01: qty 10

## 2014-09-01 MED ORDER — LOPERAMIDE HCL 2 MG PO CAPS
2.0000 mg | ORAL_CAPSULE | Freq: Every day | ORAL | Status: DC
  Administered 2014-09-01 – 2014-09-05 (×5): 2 mg via ORAL
  Filled 2014-09-01 (×5): qty 1

## 2014-09-01 MED ORDER — METHYLPREDNISOLONE SODIUM SUCC 125 MG IJ SOLR
80.0000 mg | Freq: Four times a day (QID) | INTRAMUSCULAR | Status: DC
  Administered 2014-09-01 – 2014-09-02 (×3): 80 mg via INTRAVENOUS
  Filled 2014-09-01 (×3): qty 2

## 2014-09-01 MED ORDER — ALLOPURINOL 100 MG PO TABS
200.0000 mg | ORAL_TABLET | Freq: Every day | ORAL | Status: DC
  Administered 2014-09-01 – 2014-09-05 (×5): 200 mg via ORAL
  Filled 2014-09-01 (×5): qty 2

## 2014-09-01 MED ORDER — ONDANSETRON HCL 4 MG/2ML IJ SOLN: 4.0000 mg | Freq: Four times a day (QID) | INTRAMUSCULAR | Status: DC | PRN

## 2014-09-01 MED ORDER — SODIUM CHLORIDE 0.9 % IV SOLN
INTRAVENOUS | Status: DC
  Administered 2014-09-01 – 2014-09-02 (×2): via INTRAVENOUS

## 2014-09-01 MED ORDER — GLIMEPIRIDE 2 MG PO TABS
4.0000 mg | ORAL_TABLET | Freq: Every day | ORAL | Status: DC
  Administered 2014-09-02 – 2014-09-05 (×4): 4 mg via ORAL
  Filled 2014-09-01 (×4): qty 2

## 2014-09-01 MED ORDER — ONDANSETRON HCL 4 MG PO TABS: 4.0000 mg | ORAL_TABLET | Freq: Four times a day (QID) | ORAL | Status: DC | PRN

## 2014-09-01 MED ORDER — ONDANSETRON HCL 4 MG/2ML IJ SOLN
4.0000 mg | Freq: Once | INTRAMUSCULAR | Status: AC
  Administered 2014-09-01: 4 mg via INTRAVENOUS
  Filled 2014-09-01: qty 2

## 2014-09-01 MED ORDER — LISINOPRIL 5 MG PO TABS
5.0000 mg | ORAL_TABLET | Freq: Every day | ORAL | Status: DC
  Administered 2014-09-01 – 2014-09-05 (×5): 5 mg via ORAL
  Filled 2014-09-01 (×5): qty 1

## 2014-09-01 MED ORDER — ACETAMINOPHEN 500 MG PO TABS: 500.0000 mg | ORAL_TABLET | Freq: Every day | ORAL | Status: DC | PRN

## 2014-09-01 MED ORDER — IPRATROPIUM-ALBUTEROL 0.5-2.5 (3) MG/3ML IN SOLN
3.0000 mL | Freq: Once | RESPIRATORY_TRACT | Status: AC
  Administered 2014-09-01: 3 mL via RESPIRATORY_TRACT
  Filled 2014-09-01: qty 3

## 2014-09-01 MED ORDER — AZITHROMYCIN 250 MG PO TABS
500.0000 mg | ORAL_TABLET | Freq: Once | ORAL | Status: AC
  Administered 2014-09-01: 500 mg via ORAL
  Filled 2014-09-01: qty 2

## 2014-09-01 MED ORDER — PNEUMOCOCCAL VAC POLYVALENT 25 MCG/0.5ML IJ INJ
0.5000 mL | INJECTION | INTRAMUSCULAR | Status: AC
  Administered 2014-09-02: 0.5 mL via INTRAMUSCULAR
  Filled 2014-09-01: qty 0.5

## 2014-09-01 MED ORDER — VITAMIN D3 25 MCG (1000 UNIT) PO TABS
2000.0000 [IU] | ORAL_TABLET | ORAL | Status: DC
Start: 2014-09-02 — End: 2014-09-02
  Filled 2014-09-01: qty 2

## 2014-09-01 MED ORDER — CETYLPYRIDINIUM CHLORIDE 0.05 % MT LIQD
7.0000 mL | Freq: Two times a day (BID) | OROMUCOSAL | Status: DC
  Administered 2014-09-01 – 2014-09-05 (×8): 7 mL via OROMUCOSAL

## 2014-09-01 MED ORDER — ALBUTEROL SULFATE (2.5 MG/3ML) 0.083% IN NEBU: 2.5000 mg | INHALATION_SOLUTION | RESPIRATORY_TRACT | Status: DC | PRN

## 2014-09-01 MED ORDER — AMLODIPINE BESYLATE 5 MG PO TABS
5.0000 mg | ORAL_TABLET | Freq: Every day | ORAL | Status: DC
  Administered 2014-09-01 – 2014-09-05 (×5): 5 mg via ORAL
  Filled 2014-09-01 (×5): qty 1

## 2014-09-01 MED ORDER — CEFTRIAXONE SODIUM IN DEXTROSE 20 MG/ML IV SOLN
1.0000 g | INTRAVENOUS | Status: DC
  Administered 2014-09-02 – 2014-09-03 (×2): 1 g via INTRAVENOUS
  Filled 2014-09-01 (×5): qty 50

## 2014-09-01 MED ORDER — ONDANSETRON HCL 4 MG/2ML IJ SOLN: 4.0000 mg | Freq: Three times a day (TID) | INTRAMUSCULAR | Status: DC | PRN

## 2014-09-01 MED ORDER — ENOXAPARIN SODIUM 30 MG/0.3ML ~~LOC~~ SOLN
30.0000 mg | SUBCUTANEOUS | Status: DC
  Administered 2014-09-01 – 2014-09-04 (×4): 30 mg via SUBCUTANEOUS
  Filled 2014-09-01 (×4): qty 0.3

## 2014-09-01 MED ORDER — METOPROLOL TARTRATE 50 MG PO TABS
50.0000 mg | ORAL_TABLET | Freq: Every day | ORAL | Status: DC
  Administered 2014-09-01 – 2014-09-05 (×5): 50 mg via ORAL
  Filled 2014-09-01 (×5): qty 1

## 2014-09-01 MED ORDER — ALBUTEROL SULFATE HFA 108 (90 BASE) MCG/ACT IN AERS: 2.0000 | INHALATION_SPRAY | Freq: Every day | RESPIRATORY_TRACT | Status: DC | PRN

## 2014-09-01 MED ORDER — SODIUM CHLORIDE 0.9 % IV BOLUS (SEPSIS)
500.0000 mL | Freq: Once | INTRAVENOUS | Status: AC
  Administered 2014-09-01: 500 mL via INTRAVENOUS

## 2014-09-01 MED ORDER — ONDANSETRON HCL 4 MG/2ML IJ SOLN
4.0000 mg | Freq: Once | INTRAMUSCULAR | Status: AC
Start: 2014-09-01 — End: 2014-09-01
  Administered 2014-09-01: 4 mg via INTRAVENOUS
  Filled 2014-09-01: qty 2

## 2014-09-01 MED ORDER — DEXTROSE 5 % IV SOLN
500.0000 mg | INTRAVENOUS | Status: DC
  Administered 2014-09-02: 500 mg via INTRAVENOUS
  Filled 2014-09-01 (×2): qty 500

## 2014-09-01 NOTE — ED Notes (Signed)
Attempted report x1; was told they would call me back when room ready.

## 2014-09-01 NOTE — ED Provider Notes (Signed)
CSN: [id number]     Arrival date & time 09/01/14  1258 History  This chart was scribed for Dorie Rank, MD by Edison Simon, ED Scribe. This patient was seen in room APA03/APA03 and the patient's care was started at 2:03 PM.    Chief Complaint  Patient presents with  . Emesis   The history is provided by the patient and a relative. No language interpreter was used.    HPI Comments: Lindsay Cortez is a 74 y.o. female who presents to the Emergency Department complaining of nausea and vomiting with onset this morning. She reports approximately 10 counts of vomiting this morning. She states she was evaluated here yesterday for coughing and was prescribed Levaquin which she started yesterday while here. Per daughter, she spoke on the phone with her PCP 1 week ago for her symptoms and had a Z-pack called in. When symptoms did not resolve, she was evaluated at an Urgent Care and diagnosed with pneumonia and COPD after chest x-ray and was referred here. Daughter notes that the patient has had nausea and vomiting from inner ear problems before, but she denies dizziness at this time. She denies fever, pain anywhere, or SOB.  Past Medical History  Diagnosis Date  . Renal disorder   . Gout   . Hypertension   . Asthma   . Diabetes mellitus     x 15 yrs  . Back pain   . Pancreatic cyst   . Shoulder dislocation   . COPD (chronic obstructive pulmonary disease)   . Pneumonia    Past Surgical History  Procedure Laterality Date  . Breast surgery      benign  . Abdominal hysterectomy    . Bladder surgery      bladder tac  . Foot surgery     Family History  Problem Relation Age of Onset  . Cancer Mother    History  Substance Use Topics  . Smoking status: Never Smoker   . Smokeless tobacco: Never Used  . Alcohol Use: No   OB History    Gravida Para Term Preterm AB TAB SAB Ectopic Multiple Living   2 2 2       2      Review of Systems  Constitutional: Negative for fever.  Respiratory:  Negative for shortness of breath.   Cardiovascular: Negative for chest pain.  Gastrointestinal: Positive for nausea and vomiting. Negative for abdominal pain.  Neurological: Negative for dizziness.  All other systems reviewed and are negative.     Allergies  Fentanyl and Codeine  Home Medications   Prior to Admission medications   Medication Sig Start Date End Date Taking? Authorizing Provider  acetaminophen (TYLENOL) 500 MG tablet Take 500 mg by mouth daily as needed (pain). For pain   Yes Historical Provider, MD  albuterol (PROVENTIL) (2.5 MG/3ML) 0.083% nebulizer solution Take 2.5 mg by nebulization every 4 (four) hours as needed for wheezing.   Yes Historical Provider, MD  albuterol (VENTOLIN HFA) 108 (90 BASE) MCG/ACT inhaler Inhale 2 puffs into the lungs daily as needed for wheezing or shortness of breath. For rescue   Yes Historical Provider, MD  allopurinol (ZYLOPRIM) 100 MG tablet Take 200 mg by mouth every morning.     Yes Historical Provider, MD  amLODipine (NORVASC) 5 MG tablet Take 5 mg by mouth daily.   Yes Historical Provider, MD  budesonide-formoterol (SYMBICORT) 80-4.5 MCG/ACT inhaler Inhale 2 puffs into the lungs 2 (two) times daily.    Yes Historical  Provider, MD  levofloxacin (LEVAQUIN) 750 MG tablet Take 1 tablet (750 mg total) by mouth every other day. 08/31/14 09/06/14 Yes Johnna Acosta, MD  Calcium Carbonate-Vitamin D (CALCIUM 600 + D PO) Take 1 tablet by mouth daily.      Historical Provider, MD  Cholecalciferol (VITAMIN D) 2000 UNITS CAPS Take 1 capsule by mouth every morning.      Historical Provider, MD  glimepiride (AMARYL) 2 MG tablet Take 2-4 mg by mouth 2 (two) times daily. 2 in the morning and 1 at bedtime    Historical Provider, MD  lisinopril (PRINIVIL,ZESTRIL) 5 MG tablet Take 5 mg by mouth daily.    Historical Provider, MD  loperamide (IMODIUM) 2 MG capsule Take 2 mg by mouth every morning.      Historical Provider, MD  metoprolol (LOPRESSOR) 50 MG  tablet Take 50 mg by mouth daily.      Historical Provider, MD  simvastatin (ZOCOR) 10 MG tablet Take 10 mg by mouth at bedtime.      Historical Provider, MD   BP 138/67 mmHg  Pulse 96  Temp(Src) 98 F (36.7 C) (Oral)  Resp 19  Ht 4\' 11"  (1.499 m)  Wt 153 lb (69.4 kg)  BMI 30.89 kg/m2  SpO2 97% Physical Exam  Constitutional: She appears listless.  Non-toxic appearance. She appears ill. No distress.  HENT:  Head: Normocephalic and atraumatic.  Right Ear: External ear normal.  Left Ear: External ear normal.  Mouth/Throat: Mucous membranes are dry. No oropharyngeal exudate.  Eyes: Conjunctivae are normal. Right eye exhibits no discharge. Left eye exhibits no discharge. No scleral icterus.  Neck: Neck supple. No tracheal deviation present.  Cardiovascular: Normal rate, regular rhythm and intact distal pulses.   Pulmonary/Chest: Effort normal. No stridor. No respiratory distress. She has wheezes. She has rhonchi. She has no rales.  Abdominal: Soft. Bowel sounds are normal. She exhibits no distension. There is no tenderness. There is no rebound and no guarding.  Musculoskeletal: She exhibits no edema or tenderness.  Neurological: She has normal strength. She appears listless. No cranial nerve deficit (no facial droop, extraocular movements intact, no slurred speech) or sensory deficit. She exhibits normal muscle tone. She displays no seizure activity. Coordination normal.  Skin: Skin is warm and dry. No rash noted. She is not diaphoretic.  Psychiatric: She has a normal mood and affect.  Nursing note and vitals reviewed.   ED Course  Procedures (including critical care time)  DIAGNOSTIC STUDIES: Oxygen Saturation is 82% on room air, low by my interpretation.    COORDINATION OF CARE: 2:07 PM Discussed treatment plan with patient at beside, including blood count, electrolytes, kidney functions, and nausea medication. The patient agrees with the plan and has no further questions at this  time.   Labs Review Labs Reviewed  CBC WITH DIFFERENTIAL - Abnormal; Notable for the following:    Neutrophils Relative % 84 (*)    Neutro Abs 8.1 (*)    Lymphocytes Relative 9 (*)    All other components within normal limits  COMPREHENSIVE METABOLIC PANEL - Abnormal; Notable for the following:    Glucose, Bld 233 (*)    BUN 29 (*)    Creatinine, Ser 1.82 (*)    GFR calc non Af Amer 26 (*)    GFR calc Af Amer 30 (*)    All other components within normal limits  URINALYSIS, ROUTINE W REFLEX MICROSCOPIC - Abnormal; Notable for the following:    Specific Gravity, Urine >1.030 (*)  Ketones, ur TRACE (*)    All other components within normal limits  LIPASE, BLOOD    Imaging Review Dg Chest 2 View  09/01/2014   CLINICAL DATA:  Nausea, vomiting and causing since yesterday.  EXAM: CHEST  2 VIEW  COMPARISON:  02/05/2012.  FINDINGS: The cardiac silhouette, mediastinal and hilar contours are stable. There is tortuosity and calcification of the thoracic aorta. There are emphysematous and bronchitic changes along with bibasilar scarring. Probable superimposed streaky basilar atelectasis. No infiltrates or effusions. No pulmonary edema. The bony thorax is intact.  IMPRESSION: Chronic emphysematous and bronchitic lung changes along with basilar scarring changes and minimal superimposed atelectasis.   Electronically Signed   By: Kalman Jewels M.D.   On: 09/01/2014 16:31     MDM   Final diagnoses:  Hypoxia  Non-intractable vomiting with nausea, vomiting of unspecified type  Dehydration    Pt was recently diagnosed with pna.  Started taking levaquin and then nausea and vomiting started this morning.  PORT score 95.  Also hypoxic in the ED. CXR with radiology reading does not show a PNA.  Could be related to COPD exacerbation.  Will start on IV abx and admit to the hospital for further treatment.    I personally performed the services described in this documentation, which was scribed in my  presence.  The recorded information has been reviewed and is accurate.    Dorie Rank, MD 09/01/14 [phone]

## 2014-09-01 NOTE — ED Notes (Signed)
Patient reports being seen here yesterday and diagnosed with pneumonia. Patient giving Levaquin, now reports nausea and vomiting that started this morning. Patient reports she had fevers a few days ago but denies any now. Denies any diarrhea.  Per family patient got call from urgent care today and told she had COPD as well.

## 2014-09-01 NOTE — H&P (Addendum)
[facility] History and Physical  Lindsay Cortez [id number] DOB: 10/06/1940 DOA: 09/01/2014  Referring physician: ER PCP: Glo Herring., MD   Chief Complaint: Cough, dyspnea.  HPI: Lindsay Cortez is a 74 y.o. female  This 74 year old lady who has a history of COPD, predominantly asthma, presents with one-week history of cough, mostly productive of white sputum associated with wheezing and increasing dyspnea. She was treated as an outpatient with Zithromax but patient has not improved. She was told that she has pneumonia. She is diabetic. This morning she has been nauseous and has been vomiting approximately 10 times. She denies any abdominal pain. She has had as a result of this very poor by mouth intake. She has had no fever, chest pain, diarrhea. She is a nonsmoker.   Review of Systems:  Apart from symptoms above, all systems negative.  Past Medical History  Diagnosis Date  . Renal disorder   . Gout   . Hypertension   . Asthma   . Diabetes mellitus     x 15 yrs  . Back pain   . Pancreatic cyst   . Shoulder dislocation   . COPD (chronic obstructive pulmonary disease)   . Pneumonia   . Diabetes 09/01/2014   Past Surgical History  Procedure Laterality Date  . Breast surgery      benign  . Abdominal hysterectomy    . Bladder surgery      bladder tac  . Foot surgery     Social History:  reports that she has never smoked. She has never used smokeless tobacco. She reports that she does not drink alcohol or use illicit drugs.  Allergies  Allergen Reactions  . Fentanyl Nausea And Vomiting  . Codeine     Narcotic pain medicine makes her nauseated.  Says was told to only take tylenol due to kidney disease.    Family History  Problem Relation Age of Onset  . Cancer Mother   There is a strong family history of asthma/COPD in many family members.   Prior to Admission medications   Medication Sig Start Date End Date Taking? Authorizing Provider  acetaminophen  (TYLENOL) 500 MG tablet Take 500 mg by mouth daily as needed (pain). For pain   Yes Historical Provider, MD  albuterol (PROVENTIL) (2.5 MG/3ML) 0.083% nebulizer solution Take 2.5 mg by nebulization every 4 (four) hours as needed for wheezing.   Yes Historical Provider, MD  albuterol (VENTOLIN HFA) 108 (90 BASE) MCG/ACT inhaler Inhale 2 puffs into the lungs daily as needed for wheezing or shortness of breath. For rescue   Yes Historical Provider, MD  allopurinol (ZYLOPRIM) 100 MG tablet Take 200 mg by mouth every morning.     Yes Historical Provider, MD  amLODipine (NORVASC) 5 MG tablet Take 5 mg by mouth daily.   Yes Historical Provider, MD  budesonide-formoterol (SYMBICORT) 80-4.5 MCG/ACT inhaler Inhale 2 puffs into the lungs 2 (two) times daily.    Yes Historical Provider, MD  levofloxacin (LEVAQUIN) 750 MG tablet Take 1 tablet (750 mg total) by mouth every other day. 08/31/14 09/06/14 Yes Johnna Acosta, MD  Calcium Carbonate-Vitamin D (CALCIUM 600 + D PO) Take 1 tablet by mouth daily.      Historical Provider, MD  Cholecalciferol (VITAMIN D) 2000 UNITS CAPS Take 1 capsule by mouth every morning.      Historical Provider, MD  glimepiride (AMARYL) 2 MG tablet Take 2-4 mg by mouth 2 (two) times daily. 2 in the morning and 1  at bedtime    Historical Provider, MD  lisinopril (PRINIVIL,ZESTRIL) 5 MG tablet Take 5 mg by mouth daily.    Historical Provider, MD  loperamide (IMODIUM) 2 MG capsule Take 2 mg by mouth every morning.      Historical Provider, MD  metoprolol (LOPRESSOR) 50 MG tablet Take 50 mg by mouth daily.      Historical Provider, MD  simvastatin (ZOCOR) 10 MG tablet Take 10 mg by mouth at bedtime.      Historical Provider, MD   Physical Exam: Filed Vitals:   09/01/14 1630 09/01/14 1700 09/01/14 1727 09/01/14 1730  BP: 114/86 128/80  112/72  Pulse: 105 94  96  Temp:      TempSrc:      Resp:      Height:      Weight:      SpO2: 96% 96% 97% 98%    Wt Readings from Last 3 Encounters:    09/01/14 69.4 kg (153 lb)  08/31/14 69.4 kg (153 lb)  07/28/14 68.04 kg (150 lb)    General:  Appears anxious. She appears clinically dehydrated. There is no significant increased work of breathing. There is no peripheral central cyanosis. Eyes: PERRL, normal lids, irises & conjunctiva ENT: grossly normal hearing, lips & tongue Neck: no LAD, masses or thyromegaly Cardiovascular: RRR, no m/r/g. No LE edema. No clinical evidence of heart failure. Telemetry: SR, no arrhythmias  Respiratory: Bilateral wheezing, moderate. There are no crackles or bronchial breathing. Abdomen: soft, ntnd Skin: no rash or induration seen on limited exam Musculoskeletal: grossly normal tone BUE/BLE Psychiatric: grossly normal mood and affect, speech fluent and appropriate Neurologic: grossly non-focal.          Labs on Admission:  Basic Metabolic Panel:  Recent Labs Lab 08/31/14 1451 09/01/14 1442  NA 139 138  K 3.9 4.2  CL 108 104  CO2 24 23  GLUCOSE 103* 233*  BUN 28* 29*  CREATININE 1.57* 1.82*  CALCIUM 9.4 9.5   Liver Function Tests:  Recent Labs Lab 08/31/14 1451 09/01/14 1442  AST 23 24  ALT 16 17  ALKPHOS 80 82  BILITOT 0.7 0.9  PROT 6.7 7.0  ALBUMIN 3.9 4.1    Recent Labs Lab 09/01/14 1442  LIPASE 25   No results for input(s): AMMONIA in the last 168 hours. CBC:  Recent Labs Lab 08/31/14 1451 09/01/14 1442  WBC 9.3 9.7  NEUTROABS 6.0 8.1*  HGB 12.2 12.6  HCT 37.6 38.3  MCV 95.2 94.8  PLT 231 229   Cardiac Enzymes: No results for input(s): CKTOTAL, CKMB, CKMBINDEX, TROPONINI in the last 168 hours.  BNP (last 3 results) No results for input(s): PROBNP in the last 8760 hours. CBG: No results for input(s): GLUCAP in the last 168 hours.  Radiological Exams on Admission: Dg Chest 2 View  09/01/2014   CLINICAL DATA:  Nausea, vomiting and causing since yesterday.  EXAM: CHEST  2 VIEW  COMPARISON:  02/05/2012.  FINDINGS: The cardiac silhouette, mediastinal and  hilar contours are stable. There is tortuosity and calcification of the thoracic aorta. There are emphysematous and bronchitic changes along with bibasilar scarring. Probable superimposed streaky basilar atelectasis. No infiltrates or effusions. No pulmonary edema. The bony thorax is intact.  IMPRESSION: Chronic emphysematous and bronchitic lung changes along with basilar scarring changes and minimal superimposed atelectasis.   Electronically Signed   By: Kalman Jewels M.D.   On: 09/01/2014 16:31    Assessment/Plan   1. COPD exacerbation. She  will be treated with intravenous steroids, intravenous antibiotics and bronchodilators. She may need home oxygen when she is improved. 2. Nausea and vomiting. This is probably a combination of dehydration and postnasal drainage causing excessive coughing. Treat with intravenous fluids. 3. Dehydration with acute on chronic renal failure. Treat with intravenous fluids. 4. Diabetes. Continue with home medications and sliding scale of insulin. 5. Possible pancreatic mass on previous CT scans. I will check CA 19-9 and this may need further investigation.  Further recommendations will depend on patient's hospital progress.   Code Status: Full code.  DVT Prophylaxis: Lovenox.  Family Communication: I discussed the plan with the patient at the bedside.   Disposition Plan: Home when medically stable.   Time spent: 60 minutes.  Doree Albee [facility] Pager [phone].

## 2014-09-01 NOTE — ED Notes (Signed)
RT called for breathing tx. 

## 2014-09-02 DIAGNOSIS — N183 Chronic kidney disease, stage 3 unspecified: Secondary | ICD-10-CM

## 2014-09-02 DIAGNOSIS — N189 Chronic kidney disease, unspecified: Secondary | ICD-10-CM

## 2014-09-02 DIAGNOSIS — J9601 Acute respiratory failure with hypoxia: Secondary | ICD-10-CM

## 2014-09-02 DIAGNOSIS — J441 Chronic obstructive pulmonary disease with (acute) exacerbation: Secondary | ICD-10-CM | POA: Diagnosis not present

## 2014-09-02 LAB — GLUCOSE, CAPILLARY
GLUCOSE-CAPILLARY: 215 mg/dL — AB (ref 70–99)
GLUCOSE-CAPILLARY: 363 mg/dL — AB (ref 70–99)
GLUCOSE-CAPILLARY: 342 mg/dL — AB (ref 70–99)
GLUCOSE-CAPILLARY: 348 mg/dL — AB (ref 70–99)

## 2014-09-02 LAB — COMPREHENSIVE METABOLIC PANEL
ALBUMIN: 3.3 g/dL — AB (ref 3.5–5.2)
ALT: 14 U/L (ref 0–35)
ANION GAP: 8 (ref 5–15)
AST: 20 U/L (ref 0–37)
Alkaline Phosphatase: 66 U/L (ref 39–117)
BUN: 28 mg/dL — ABNORMAL HIGH (ref 6–23)
CHLORIDE: 108 meq/L (ref 96–112)
CO2: 22 mmol/L (ref 19–32)
CREATININE: 1.64 mg/dL — AB (ref 0.50–1.10)
Calcium: 8.6 mg/dL (ref 8.4–10.5)
GFR calc Af Amer: 34 mL/min — ABNORMAL LOW (ref >90)
GFR calc non Af Amer: 30 mL/min — ABNORMAL LOW (ref >90)
Glucose, Bld: 215 mg/dL — ABNORMAL HIGH (ref 70–99)
Potassium: 4.1 mmol/L (ref 3.5–5.1)
SODIUM: 138 mmol/L (ref 135–145)
TOTAL PROTEIN: 6.1 g/dL (ref 6.0–8.3)
Total Bilirubin: 0.5 mg/dL (ref 0.3–1.2)

## 2014-09-02 LAB — CBC
HEMATOCRIT: 34.4 % — AB (ref 36.0–46.0)
HEMOGLOBIN: 11.2 g/dL — AB (ref 12.0–15.0)
MCH: 30.9 pg (ref 26.0–34.0)
MCHC: 32.6 g/dL (ref 30.0–36.0)
MCV: 94.8 fL (ref 78.0–100.0)
Platelets: 225 10*3/uL (ref 150–400)
RBC: 3.63 MIL/uL — ABNORMAL LOW (ref 3.87–5.11)
RDW: 14.8 % (ref 11.5–15.5)
WBC: 6.7 10*3/uL (ref 4.0–10.5)

## 2014-09-02 LAB — CANCER ANTIGEN 19-9: CA 19 9: 15.9 U/mL — AB (ref <35.0)

## 2014-09-02 LAB — HEMOGLOBIN A1C
HEMOGLOBIN A1C: 7 % — AB (ref <5.7)
Mean Plasma Glucose: 154 mg/dL — ABNORMAL HIGH (ref <117)

## 2014-09-02 MED ORDER — METHYLPREDNISOLONE SODIUM SUCC 125 MG IJ SOLR
80.0000 mg | Freq: Two times a day (BID) | INTRAMUSCULAR | Status: DC
  Administered 2014-09-02 – 2014-09-03 (×2): 80 mg via INTRAVENOUS
  Filled 2014-09-02 (×2): qty 2

## 2014-09-02 MED ORDER — INSULIN ASPART 100 UNIT/ML ~~LOC~~ SOLN
0.0000 [IU] | Freq: Three times a day (TID) | SUBCUTANEOUS | Status: DC
  Administered 2014-09-02: 7 [IU] via SUBCUTANEOUS

## 2014-09-02 MED ORDER — INSULIN ASPART 100 UNIT/ML ~~LOC~~ SOLN
0.0000 [IU] | Freq: Every day | SUBCUTANEOUS | Status: DC
  Administered 2014-09-02: 4 [IU] via SUBCUTANEOUS

## 2014-09-02 MED ORDER — VITAMIN D 1000 UNITS PO TABS
2000.0000 [IU] | ORAL_TABLET | Freq: Every day | ORAL | Status: DC
  Administered 2014-09-02 – 2014-09-05 (×4): 2000 [IU] via ORAL
  Filled 2014-09-02 (×4): qty 2

## 2014-09-02 MED ORDER — IPRATROPIUM-ALBUTEROL 0.5-2.5 (3) MG/3ML IN SOLN
3.0000 mL | Freq: Four times a day (QID) | RESPIRATORY_TRACT | Status: DC
  Administered 2014-09-02 – 2014-09-05 (×12): 3 mL via RESPIRATORY_TRACT
  Filled 2014-09-02 (×12): qty 3

## 2014-09-02 NOTE — Care Management Note (Signed)
    Page 2 of 2   09/04/2014     11:39:13 AM CARE MANAGEMENT NOTE 09/04/2014  Patient:  Lindsay Cortez, Lindsay Cortez   Account Number:  [id number]  Date Initiated:  09/02/2014  Documentation initiated by:  Theophilus Kinds  Subjective/Objective Assessment:   Pt admitted from home with COPD. Pt lives alone and will return home at discharge. Pt is independent with ADls. Pt has a neb machine for home use.     Action/Plan:   Will assess for need for home O2. Will continue to follow for discharge planning needs.   Anticipated DC Date:  09/05/2014   Anticipated DC Plan:  [place]  CM consult      [facility] Choice  HOME HEALTH   Choice offered to / List presented to:  C-1 Patient        [place] arranged  HH-1 RN  [place].   Status of service:  Completed, signed off Medicare Important Message given?  YES (If response is "NO", the following Medicare IM given date fields will be blank) Date Medicare IM given:  09/04/2014 Medicare IM given by:  Theophilus Kinds Date Additional Medicare IM given:   Additional Medicare IM given by:    Discharge Disposition:  [place]  Per UR Regulation:    If discussed at Long Length of Stay Meetings, dates discussed:    Comments:  09/04/14 [place], RN BSN CM Pt potential discharge within 24 hours. [place] RN arranged with [facility] (per pts choice). Romualdo Bolk of [facility] is aware and will collect the pts information from the chart. [place] services to start within 48 hours of discharge. Will assess need for home O2 and neb machine. If needed will order from [facility] (per pts choice). Pt and pts nurse aware of discharge arrangements.  09/02/14 [place], RN BSN CM

## 2014-09-02 NOTE — Care Management Utilization Note (Signed)
UR completed 

## 2014-09-02 NOTE — Progress Notes (Signed)
PROGRESS NOTE  Lindsay Cortez [id number] DOB: 10-02-1940 DOA: 09/01/2014 PCP: Glo Herring., MD  Summary: 74 year old woman with history of COPD, predominantly asthma who presented with a weeklong history of cough and productive sputum. Seen 1/4 in the emergency department diagnosed with pneumonia and started on Levaquin. Returned 1/5 with nausea and vomiting. Admitted for COPD exacerbation.  Assessment/Plan: 1. Acute hypoxic respiratory failure secondary to COPD exacerbation, stable. 2. COPD exacerbation, clinically improved.  3. Nausea and vomiting, resolved.  4. Diabetes mellitus poor control on IV steroids.  5. Probably CKD stage III 6. Possible pancreatic mass on previous CT scan 03/2014. CA 19-9 pending. Follow-up with MRI as an outpatient.   Improving, plan to continue empiric steroids, antibiotics, bronchodilators.  Wean oxygen as tolerated.  Code Status: full code DVT prophylaxis: Lovenox Family Communication: none present Disposition Plan: home  Murray Hodgkins, MD  [facility]  Pager [phone] If 7PM-7AM, please contact night-coverage at [email or web address], password [facility] 09/02/2014, 12:02 PM  LOS: 1 day   Consultants:    Procedures:    Antibiotics: Ceftriaxone 1/5  >> Zithromax 1/5  >>  HPI/Subjective: Feeling better.   Sleeping: ok Eating/GI: very well, no GI issues Breathing: better, still coughing Pain: none Desires: none  Objective: Filed Vitals:   09/01/14 1834 09/01/14 2111 09/02/14 0611 09/02/14 1048  BP: 105/75 108/63 119/89   Pulse: 99 95 94   Temp: 98.3 F (36.8 C) 99.2 F (37.3 C) 98.9 F (37.2 C)   TempSrc: Oral Oral Oral   Resp: 18     Height: 4\' 11"  (1.499 m)     Weight: 69.4 kg (153 lb)     SpO2: 96% 92% 93% 89%    Intake/Output Summary (Last 24 hours) at 09/02/14 1202 Last data filed at 09/02/14 0800  Gross per 24 hour  Intake   1125 ml  Output   1000 ml  Net    125 ml     Filed Weights   09/01/14 1301  09/01/14 1834  Weight: 69.4 kg (153 lb) 69.4 kg (153 lb)    Exam:     Afebrile, VSS General:  Appears calm and comfortable Cardiovascular: RRR, no m/r/g. No LE edema. Respiratory: CTA bilaterally, no w/r/r. Normal respiratory effort. Speaks in full sentences. Musculoskeletal: grossly normal tone BUE/BLE Psychiatric: grossly normal mood and affect, speech fluent and appropriate Neurologic: grossly non-focal.  Data Reviewed:  CBG high, 363  Creatinine appears to be at baseline, 1.64. LFTs unremarkable.  CBC unremarkable.   Pertinent data: Admission Labs  Complete metabolic panel and CBC unremarkable  Imaging   Chest x-ray chronic emphysematous changes.  Other Urinalysis negative   Pending data:  none  Scheduled Meds: . allopurinol  200 mg Oral Daily  . amLODipine  5 mg Oral Daily  . antiseptic oral rinse  7 mL Mouth Rinse BID  . azithromycin  500 mg Intravenous Q24H  . budesonide-formoterol  2 puff Inhalation BID  . cefTRIAXone (ROCEPHIN)  IV  1 g Intravenous Q24H  . cholecalciferol  2,000 Units Oral Daily  . enoxaparin (LOVENOX) injection  30 mg Subcutaneous Q24H  . glimepiride  2 mg Oral QHS  . glimepiride  4 mg Oral Q breakfast  . lisinopril  5 mg Oral Daily  . loperamide  2 mg Oral Daily  . methylPREDNISolone (SOLU-MEDROL) injection  80 mg Intravenous Q6H  . metoprolol  50 mg Oral Daily  . pneumococcal 23 valent vaccine  0.5 mL Intramuscular Tomorrow-1000  . simvastatin  10 mg  Oral QHS   Continuous Infusions: . sodium chloride 100 mL/hr at 09/02/14 [id number]    Principal Problem:   Acute respiratory failure with hypoxia Active Problems:   Nausea and vomiting   Dehydration   Diabetes   Acute exacerbation of COPD with asthma   CKD (chronic kidney disease) stage 3, GFR 30-59 ml/min   Time spent 20 minutes

## 2014-09-03 DIAGNOSIS — J9601 Acute respiratory failure with hypoxia: Secondary | ICD-10-CM

## 2014-09-03 DIAGNOSIS — E11649 Type 2 diabetes mellitus with hypoglycemia without coma: Secondary | ICD-10-CM

## 2014-09-03 DIAGNOSIS — J449 Chronic obstructive pulmonary disease, unspecified: Secondary | ICD-10-CM

## 2014-09-03 LAB — GLUCOSE, CAPILLARY
Glucose-Capillary: 246 mg/dL — ABNORMAL HIGH (ref 70–99)
Glucose-Capillary: 365 mg/dL — ABNORMAL HIGH (ref 70–99)
Glucose-Capillary: 151 mg/dL — ABNORMAL HIGH (ref 70–99)
Glucose-Capillary: 303 mg/dL — ABNORMAL HIGH (ref 70–99)

## 2014-09-03 MED ORDER — INSULIN ASPART 100 UNIT/ML ~~LOC~~ SOLN
0.0000 [IU] | Freq: Every day | SUBCUTANEOUS | Status: DC
  Administered 2014-09-03: 4 [IU] via SUBCUTANEOUS
  Administered 2014-09-04: 3 [IU] via SUBCUTANEOUS

## 2014-09-03 MED ORDER — INSULIN ASPART 100 UNIT/ML ~~LOC~~ SOLN
0.0000 [IU] | Freq: Three times a day (TID) | SUBCUTANEOUS | Status: DC
  Administered 2014-09-03: 5 [IU] via SUBCUTANEOUS
  Administered 2014-09-03: 15 [IU] via SUBCUTANEOUS
  Administered 2014-09-03: 3 [IU] via SUBCUTANEOUS
  Administered 2014-09-04: 5 [IU] via SUBCUTANEOUS
  Administered 2014-09-04: 3 [IU] via SUBCUTANEOUS
  Administered 2014-09-04: 8 [IU] via SUBCUTANEOUS
  Administered 2014-09-05 (×2): 3 [IU] via SUBCUTANEOUS

## 2014-09-03 MED ORDER — AZITHROMYCIN 250 MG PO TABS
500.0000 mg | ORAL_TABLET | Freq: Every day | ORAL | Status: DC
  Administered 2014-09-03 – 2014-09-04 (×2): 500 mg via ORAL
  Filled 2014-09-03 (×2): qty 2

## 2014-09-03 MED ORDER — INSULIN ASPART 100 UNIT/ML ~~LOC~~ SOLN
3.0000 [IU] | Freq: Three times a day (TID) | SUBCUTANEOUS | Status: DC
  Administered 2014-09-03 – 2014-09-05 (×8): 3 [IU] via SUBCUTANEOUS

## 2014-09-03 MED ORDER — PREDNISONE 20 MG PO TABS
40.0000 mg | ORAL_TABLET | Freq: Every day | ORAL | Status: DC
  Administered 2014-09-04 – 2014-09-05 (×2): 40 mg via ORAL
  Filled 2014-09-03: qty 4
  Filled 2014-09-03: qty 2

## 2014-09-03 NOTE — Progress Notes (Signed)
PHARMACIST - PHYSICIAN COMMUNICATION DR:   Sarajane Jews CONCERNING: Antibiotic IV to Oral Route Change Policy  RECOMMENDATION: This patient is receiving Zithromax by the intravenous route.  Based on criteria approved by the Pharmacy and Therapeutics Committee, the antibiotic(s) is/are being converted to the equivalent oral dose form(s).   DESCRIPTION: These criteria include:  Patient being treated for a respiratory tract infection, urinary tract infection, cellulitis or clostridium difficile associated diarrhea if on metronidazole  The patient is not neutropenic and does not exhibit a GI malabsorption state  The patient is eating (either orally or via tube) and/or has been taking other orally administered medications for a least 24 hours  The patient is improving clinically and has a Tmax < 100.5  If you have questions about this conversion, please contact the Pharmacy Department  [x]   [phone] )  [facility] []   [phone] )  [facility]  []   [phone] )  [facility] []   [phone] )  [place], PharmD Clinical Pharmacist

## 2014-09-03 NOTE — Progress Notes (Signed)
PROGRESS NOTE  PAMALE MUMFORD [id number] DOB: 12-13-1940 DOA: 09/01/2014 PCP: Glo Herring., MD  Summary: 74 year old woman with history of COPD, predominantly asthma who presented with a weeklong history of cough and productive sputum. Seen 1/4 in the emergency department diagnosed with pneumonia and started on Levaquin. Returned 1/5 with nausea and vomiting. Admitted for COPD exacerbation.  Assessment/Plan: 1. Acute hypoxic respiratory failure secondary to COPD exacerbation, still hypoxic but improving. 2. COPD exacerbation, improving. 3. Nausea and vomiting, resolved.  4. Diabetes mellitus poor control on IV steroids.  Somewhat labile most recently less than 200, anticipate improvement now that steroids switched to oral. 5. Probably CKD stage III, stable. No further inpatient evaluation suggested. 6. Possible pancreatic mass on previous CT scan 03/2014. CA 19-9 within normal limits. Follow-up with MRI as an outpatient.   Overall improving with improved air movement and decreased wheezing.  Change steroids to oral.  Wean oxygen as tolerated.  Home likely next 2 days.  Code Status: full code DVT prophylaxis: Lovenox Family Communication: none present Disposition Plan: home  Murray Hodgkins, MD  [facility]  Pager [phone] If 7PM-7AM, please contact night-coverage at [email or web address], password [facility] 09/03/2014, 2:00 PM  LOS: 2 days   Consultants:    Procedures:    Antibiotics: Ceftriaxone 1/5  >> Zithromax 1/5  >>  HPI/Subjective: No issues charted.  Feels better, breathing better, still some wheezing. Eating well. Got some sleep last night.  Objective: Filed Vitals:   09/03/14 0602 09/03/14 0703 09/03/14 0929 09/03/14 1335  BP: 128/80     Pulse:   114   Temp:      TempSrc:      Resp:      Height:      Weight:      SpO2:  91%  90%    Intake/Output Summary (Last 24 hours) at 09/03/14 1400 Last data filed at 09/03/14 0900  Gross per 24 hour    Intake    780 ml  Output   1200 ml  Net   -420 ml     Filed Weights   09/01/14 1301 09/01/14 1834  Weight: 69.4 kg (153 lb) 69.4 kg (153 lb)    Exam:     Afebrile, VSS, hypoxic, on 3 L. General:  Appears calm and comfortable Cardiovascular: RRR, no m/r/g. No LE edema. Respiratory: CTA bilaterally, no w/r/r. Normal respiratory effort. Good air movement. Abdomen: soft, ntnd Psychiatric: grossly normal mood and affect, speech fluent and appropriate Neurologic: grossly non-focal.  Data Reviewed:  CBG remained high, 200s, 300s.  Creatinine appears to be at baseline, 1.64. LFTs unremarkable.  CBC unremarkable.   Pertinent data: Admission Labs  Complete metabolic panel and CBC unremarkable  Imaging   Chest x-ray chronic emphysematous changes.  Other Urinalysis negative   Pending data:  none  Scheduled Meds: . allopurinol  200 mg Oral Daily  . amLODipine  5 mg Oral Daily  . antiseptic oral rinse  7 mL Mouth Rinse BID  . azithromycin  500 mg Intravenous Q24H  . cefTRIAXone (ROCEPHIN)  IV  1 g Intravenous Q24H  . cholecalciferol  2,000 Units Oral Daily  . enoxaparin (LOVENOX) injection  30 mg Subcutaneous Q24H  . glimepiride  2 mg Oral QHS  . glimepiride  4 mg Oral Q breakfast  . insulin aspart  0-15 Units Subcutaneous TID WC  . insulin aspart  0-5 Units Subcutaneous QHS  . insulin aspart  3 Units Subcutaneous TID WC  . ipratropium-albuterol  3 mL Nebulization  Q6H  . lisinopril  5 mg Oral Daily  . loperamide  2 mg Oral Daily  . methylPREDNISolone (SOLU-MEDROL) injection  80 mg Intravenous Q12H  . metoprolol  50 mg Oral Daily  . simvastatin  10 mg Oral QHS   Continuous Infusions:    Principal Problem:   Acute respiratory failure with hypoxia Active Problems:   Nausea and vomiting   Dehydration   Diabetes   Acute exacerbation of COPD with asthma   CKD (chronic kidney disease) stage 3, GFR 30-59 ml/min   Time spent 20 minutes

## 2014-09-03 NOTE — Progress Notes (Signed)
Inpatient Diabetes Program Recommendations  AACE/ADA: New Consensus Statement on Inpatient Glycemic Control (2013)  Target Ranges:  Prepandial:   less than 140 mg/dL      Peak postprandial:   less than 180 mg/dL (1-2 hours)      Critically ill patients:  140 - 180 mg/dL   Results for KINDRED, THRAILKILL (MRN [id number]) as of 09/03/2014 08:06  Ref. Range 09/02/2014 07:10 09/02/2014 11:17 09/02/2014 16:38 09/02/2014 20:09 09/03/2014 07:28  Glucose-Capillary Latest Range: 70-99 mg/dL 215 (H) 363 (H) 342 (H) 348 (H) 246 (H)    Diabetes history: DM2 Outpatient Diabetes medications: Amaryl 4mg  QAM, Amaryl 2mg  QPM Current orders for Inpatient glycemic control: Amaryl 4mg  QAM, Amaryl 2mg  QPM, Novolog 0-15 units TID, Novolog 3 units TID with meals, Novolog 0-5units QHS.   Inpatient Diabetes Program Recommendations Insulin - Basal: While inpatient, may want to start patient on a low dose basal insulin; recommend starting with Levemir 7units Daily. Correction (SSI): Noted an increase From Novolog 0-9 correction scale to Novolog 0-15 correction scale TID with meals. Insulin - Meal Coverage: Noted the addition of meal coverage of 3 units TID with meals.  Thanks, Tama Headings RN, MSN, [facility] Inpatient Diabetes Coordinator Team Pager [phone]

## 2014-09-04 DIAGNOSIS — E118 Type 2 diabetes mellitus with unspecified complications: Secondary | ICD-10-CM

## 2014-09-04 LAB — GLUCOSE, CAPILLARY
GLUCOSE-CAPILLARY: 223 mg/dL — AB (ref 70–99)
GLUCOSE-CAPILLARY: 251 mg/dL — AB (ref 70–99)
Glucose-Capillary: 194 mg/dL — ABNORMAL HIGH (ref 70–99)
Glucose-Capillary: 259 mg/dL — ABNORMAL HIGH (ref 70–99)

## 2014-09-04 MED ORDER — CEFUROXIME AXETIL 250 MG PO TABS
500.0000 mg | ORAL_TABLET | Freq: Two times a day (BID) | ORAL | Status: DC
  Administered 2014-09-04 – 2014-09-05 (×2): 500 mg via ORAL
  Filled 2014-09-04 (×2): qty 2

## 2014-09-04 NOTE — Progress Notes (Signed)
PROGRESS NOTE  Lindsay Cortez [id number] DOB: 08/15/1941 DOA: 09/01/2014 PCP: Glo Herring., MD  Summary: 74 year old woman with history of COPD, predominantly asthma who presented with a weeklong history of cough and productive sputum. Seen 1/4 in the emergency department diagnosed with pneumonia and started on Levaquin. Returned 1/5 with nausea and vomiting. Admitted for COPD exacerbation.  Assessment/Plan: 1. Acute hypoxic respiratory failure secondary to COPD exacerbation, overall improving.. Still hypoxic. 2. COPD exacerbation, improving. 3. Nausea and vomiting, resolved.  4. Diabetes mellitus poor control on IV steroids. Overall improving now that she is on oral steroids. 5. Probably CKD stage III, stable. No further inpatient evaluation suggested. 6. Possible pancreatic mass on previous CT scan 03/2014. CA 19-9 within normal limits. Follow-up with MRI as an outpatient.   Continues to improve. Still hypoxic. Plan for discharge home 1/9 on oxygen nasal cannula, slow steroid taper, completed antibiotics.  Discussed with sister at bedside.  Code Status: full code DVT prophylaxis: Lovenox Family Communication:  Disposition Plan: home  Murray Hodgkins, MD  [facility]  Pager [phone] If 7PM-7AM, please contact night-coverage at [email or web address], password [facility] 09/04/2014, 12:30 PM  LOS: 3 days   Consultants:    Procedures:    Antibiotics: Ceftriaxone 1/5  >> 1/7 Ceftin 1/8 >> Zithromax 1/5  >> 1/9  HPI/Subjective: Feeling much better. Still coughing. Breathing better. Still short of breath when she ambulates.  Objective: Filed Vitals:   09/03/14 2149 09/04/14 0209 09/04/14 0624 09/04/14 0740  BP: 145/68  160/73   Pulse: 106  98   Temp: 98.5 F (36.9 C)  97.9 F (36.6 C)   TempSrc: Oral  Oral   Resp: 20  20   Height:      Weight:      SpO2: 92% 94% 93% 92%    Intake/Output Summary (Last 24 hours) at 09/04/14 1230 Last data filed at 09/04/14  0900  Gross per 24 hour  Intake    480 ml  Output    500 ml  Net    -20 ml     Filed Weights   09/01/14 1301 09/01/14 1834  Weight: 69.4 kg (153 lb) 69.4 kg (153 lb)    Exam:     Afebrile, VSS, hypoxic, on 3 L. General:  Appears comfortable, calm. Cardiovascular: Regular rate and rhythm, no murmur, rub or gallop. No lower extremity edema. Respiratory: Clear to auscultation bilaterally, no wheezes, rales or rhonchi. Normal respiratory effort. Psychiatric: grossly normal mood and affect, speech fluent and appropriate  Data Reviewed:  CBG remained high, 200s, 300s.  Pertinent data: Admission Labs  Complete metabolic panel and CBC unremarkable  Imaging   Chest x-ray chronic emphysematous changes.  Other Urinalysis negative   Pending data:  none  Scheduled Meds: . allopurinol  200 mg Oral Daily  . amLODipine  5 mg Oral Daily  . antiseptic oral rinse  7 mL Mouth Rinse BID  . azithromycin  500 mg Oral q1800  . cefTRIAXone (ROCEPHIN)  IV  1 g Intravenous Q24H  . cholecalciferol  2,000 Units Oral Daily  . enoxaparin (LOVENOX) injection  30 mg Subcutaneous Q24H  . glimepiride  2 mg Oral QHS  . glimepiride  4 mg Oral Q breakfast  . insulin aspart  0-15 Units Subcutaneous TID WC  . insulin aspart  0-5 Units Subcutaneous QHS  . insulin aspart  3 Units Subcutaneous TID WC  . ipratropium-albuterol  3 mL Nebulization Q6H  . lisinopril  5 mg Oral Daily  . loperamide  2 mg Oral Daily  . metoprolol  50 mg Oral Daily  . predniSONE  40 mg Oral Q breakfast  . simvastatin  10 mg Oral QHS   Continuous Infusions:    Principal Problem:   Acute respiratory failure with hypoxia Active Problems:   Nausea and vomiting   Dehydration   Diabetes   Acute exacerbation of COPD with asthma   CKD (chronic kidney disease) stage 3, GFR 30-59 ml/min   Time spent 15 minutes

## 2014-09-04 NOTE — Progress Notes (Signed)
Patient oxygen saturation 86% at rest on room air.

## 2014-09-04 NOTE — Progress Notes (Signed)
Inpatient Diabetes Program Recommendations  AACE/ADA: New Consensus Statement on Inpatient Glycemic Control (2013)  Target Ranges:  Prepandial:   less than 140 mg/dL      Peak postprandial:   less than 180 mg/dL (1-2 hours)      Critically ill patients:  140 - 180 mg/dL    Results for AMBER, KANHAI (MRN [id number]) as of 09/04/2014 08:29  Ref. Range 09/03/2014 07:28 09/03/2014 11:20 09/03/2014 16:54 09/03/2014 21:48 09/04/2014 07:56  Glucose-Capillary Latest Range: 70-99 mg/dL 246 (H) 365 (H) 151 (H) 303 (H) 223 (H)    Diabetes history: DM2 Outpatient Diabetes medications: Amaryl 4mg  QAM, Amaryl 2mg  QPM Current orders for Inpatient glycemic control: Amaryl 4mg  QAM, Amaryl 2mg  QPM, Novolog 0-15 units TID, Novolog 3 units TID with meals, Novolog 0-5units QHS.   Inpatient Diabetes Program Recommendations Insulin - Basal: While inpatient, may want to start patient on a low dose basal insulin; recommend starting with Levemir 7units Daily. Insulin - Meal Coverage: If steroids are continued, please increase meal coverage insulin to Novolog 5 units TID with meals if patient eats 50% or more of meals.   Thanks, Tama Headings RN, MSN, [facility] Inpatient Diabetes Coordinator Team Pager [phone]

## 2014-09-04 NOTE — Progress Notes (Signed)
Patient IV access loss.  Patient is anticipating discharge tomorrow 09/05/14. No orders for IV fluids or medications, except for IV Rocephin, 4 pm dose unable to give. Dr. Sarajane Jews notified, stated ok to leave IV out.

## 2014-09-05 LAB — GLUCOSE, CAPILLARY
Glucose-Capillary: 163 mg/dL — ABNORMAL HIGH (ref 70–99)
Glucose-Capillary: 180 mg/dL — ABNORMAL HIGH (ref 70–99)

## 2014-09-05 MED ORDER — CEFUROXIME AXETIL 500 MG PO TABS: 500.0000 mg | ORAL_TABLET | Freq: Two times a day (BID) | ORAL | Status: DC

## 2014-09-05 MED ORDER — PREDNISONE 10 MG PO TABS: ORAL_TABLET | ORAL | Status: DC

## 2014-09-05 MED ORDER — ALBUTEROL SULFATE HFA 108 (90 BASE) MCG/ACT IN AERS: 2.0000 | INHALATION_SPRAY | RESPIRATORY_TRACT | Status: DC | PRN

## 2014-09-05 MED ORDER — ALBUTEROL SULFATE (2.5 MG/3ML) 0.083% IN NEBU: 2.5000 mg | INHALATION_SOLUTION | RESPIRATORY_TRACT | Status: DC | PRN

## 2014-09-05 NOTE — Discharge Summary (Signed)
Physician Discharge Summary  Lindsay Cortez [id number] DOB: December 20, 1940 DOA: 09/01/2014  PCP: Lindsay Herring., MD  Admit date: 09/01/2014 Discharge date: 09/05/2014  Recommendations for Outpatient Follow-up:  1. Home on oxygen continuous, steroid taper, complete antibiotics. Attempt wean off of oxygen. 2. Consider outpatient PFTs, consider Spiriva if clinically indicated. Deferred to the outpatient setting. 3. Could consider stopping metoprolol based on recovery of respiratory function. 4. Consider further evaluation for suspected chronic kidney disease stage III as an outpatient. 5. Review of record notable for possible pancreatic mass on previous CT scan 03/2014. CA 19-9 within normal limits. Patient knows about this and reports her doctor has plans for repeat imaging in April. In fact, she saw GI at [facility] for biopsy but at that time it was felt to be too small.  Follow-up Information    Follow up with Lindsay Cortez.   Contact information:   [address] [place] [address] [phone]       Follow up with Lindsay Herring., MD. Schedule an appointment as soon as possible for a visit in 1 week.   Specialty:  Internal Medicine   Contact information:   [address] [place] [id number] [phone]      Discharge Diagnoses:  1. Acute hypoxic respiratory failure secondary to COPD 2. COPD exacerbation 3. Asthma, emphysematous changes on chest x-ray 4. Nausea and vomiting 5. Diabetes mellitus type 2 6. Suspected chronic kidney disease stage III  Discharge Condition: improved Disposition: home  Diet recommendation: Diabetic diet  Filed Weights   09/01/14 1301 09/01/14 1834  Weight: 69.4 kg (153 lb) 69.4 kg (153 lb)    History of present illness:  74 year old woman with history of COPD, predominantly asthma who presented with a weeklong history of cough and productive sputum. Seen 1/4 in the emergency department diagnosed with pneumonia  and started on Levaquin. Returned 1/5 with nausea and vomiting. Admitted for COPD exacerbation.  Hospital Course:  Ms. Lindsay Cortez originally improved with empiric treatment with steroids, antibiotics and rhonchi dilators. She remains hypoxic but overall has improved and plan for discharge home at this time with outpatient follow-up.  1. Acute hypoxic respiratory failure secondary to COPD exacerbation, overall improving. She will need home oxygen. 2. COPD exacerbation. She has a history of asthma, never smoked but significant exposure to secondhand smoke and she has emphysematous changes on chest x-ray. On albuterol nebulizer and Symbicort at home.  3. Nausea and vomiting secondary to acute illness, resolved. 4. Diabetes mellitus type 2, stabilizing on oral prednisone. Advised to check blood sugars twice a day, monitor for blood sugars over 400, generally well-controlled oralprednisone. 5. Suspected chronic kidney disease stage III. No further inpatient evaluation suggested.  6. Possible pancreatic mass on previous CT scan 03/2014. CA 19-9 within normal limits. Follow-up with MRI as an outpatient.  Consultants:  none  Procedures:  none  Antibiotics: Ceftriaxone 1/5 >> 1/7 Ceftin 1/8 >> 1/11 Zithromax 1/5 >> 1/9  Discharge Instructions  Discharge Instructions    Diet Carb Modified    Complete by:  As directed      Discharge instructions    Complete by:  As directed   Call your physician or seek immediate medical attention for increased shortness of breath, wheezing, fever or worsening of condition. Check your blood sugars twice per day, call physician for blood sugar greater than 400.     Increase activity slowly    Complete by:  As directed  Current Discharge Medication List    START taking these medications   Details  cefUROXime (CEFTIN) 500 MG tablet Take 1 tablet (500 mg total) by mouth 2 (two) times daily with a meal. Qty: 5 tablet, Refills: 0    predniSONE  (DELTASONE) 10 MG tablet Take 40 mg by mouth daily for 3 days, then take 20 mg by mouth daily for 3 days, then take 10 mg by mouth daily for 3 days, then stop. Qty: 21 tablet, Refills: 0      CONTINUE these medications which have CHANGED   Details  albuterol (PROVENTIL) (2.5 MG/3ML) 0.083% nebulizer solution Take 3 mLs (2.5 mg total) by nebulization every 4 (four) hours as needed for wheezing. Qty: 75 mL, Refills: 1    albuterol (VENTOLIN HFA) 108 (90 BASE) MCG/ACT inhaler Inhale 2 puffs into the lungs every 4 (four) hours as needed for wheezing or shortness of breath. For rescue Qty: 1 Inhaler, Refills: 1      CONTINUE these medications which have NOT CHANGED   Details  acetaminophen (TYLENOL) 500 MG tablet Take 500 mg by mouth daily as needed (pain). For pain    allopurinol (ZYLOPRIM) 100 MG tablet Take 200 mg by mouth every morning.      amLODipine (NORVASC) 5 MG tablet Take 5 mg by mouth daily.    budesonide-formoterol (SYMBICORT) 80-4.5 MCG/ACT inhaler Inhale 2 puffs into the lungs 2 (two) times daily.     Calcium Carbonate-Vitamin D (CALCIUM 600 + D PO) Take 1 tablet by mouth daily.      Cholecalciferol (VITAMIN D) 2000 UNITS CAPS Take 1 capsule by mouth every morning.      glimepiride (AMARYL) 2 MG tablet Take 2-4 mg by mouth 2 (two) times daily. 2 in the morning and 1 at bedtime    lisinopril (PRINIVIL,ZESTRIL) 5 MG tablet Take 5 mg by mouth daily.    loperamide (IMODIUM) 2 MG capsule Take 2 mg by mouth every morning.      metoprolol (LOPRESSOR) 50 MG tablet Take 50 mg by mouth daily.      simvastatin (ZOCOR) 10 MG tablet Take 10 mg by mouth at bedtime.        STOP taking these medications     levofloxacin (LEVAQUIN) 750 MG tablet        Allergies  Allergen Reactions  . Fentanyl Nausea And Vomiting  . Codeine     Narcotic pain medicine makes her nauseated.  Says was told to only take tylenol due to kidney disease.  Lindsay Cortez [Levofloxacin In D5w] Nausea And  Vomiting    The results of significant diagnostics from this hospitalization (including imaging, microbiology, ancillary and laboratory) are listed below for reference.    Significant Diagnostic Studies: Dg Chest 2 View  09/01/2014   CLINICAL DATA:  Nausea, vomiting and causing since yesterday.  EXAM: CHEST  2 VIEW  COMPARISON:  02/05/2012.  FINDINGS: The cardiac silhouette, mediastinal and hilar contours are stable. There is tortuosity and calcification of the thoracic aorta. There are emphysematous and bronchitic changes along with bibasilar scarring. Probable superimposed streaky basilar atelectasis. No infiltrates or effusions. No pulmonary edema. The bony thorax is intact.  IMPRESSION: Chronic emphysematous and bronchitic lung changes along with basilar scarring changes and minimal superimposed atelectasis.   Electronically Signed   By: Kalman Jewels M.D.   On: 09/01/2014 16:31    Labs: Basic Metabolic Panel:  Recent Labs Lab 08/31/14 1451 09/01/14 1442 09/02/14 0529  NA 139 138 138  K 3.9 4.2 4.1  CL 108 104 108  CO2 24 23 22   GLUCOSE 103* 233* 215*  BUN 28* 29* 28*  CREATININE 1.57* 1.82* 1.64*  CALCIUM 9.4 9.5 8.6   Liver Function Tests:  Recent Labs Lab 08/31/14 1451 09/01/14 1442 09/02/14 0529  AST 23 24 20   ALT 16 17 14   ALKPHOS 80 82 66  BILITOT 0.7 0.9 0.5  PROT 6.7 7.0 6.1  ALBUMIN 3.9 4.1 3.3*    Recent Labs Lab 09/01/14 1442  LIPASE 25   CBC:  Recent Labs Lab 08/31/14 1451 09/01/14 1442 09/02/14 0529  WBC 9.3 9.7 6.7  NEUTROABS 6.0 8.1*  --   HGB 12.2 12.6 11.2*  HCT 37.6 38.3 34.4*  MCV 95.2 94.8 94.8  PLT 231 229 225   CBG:  Recent Labs Lab 09/04/14 0756 09/04/14 1127 09/04/14 1659 09/04/14 2103 09/05/14 0756  GLUCAP 223* 251* 194* 259* 163*    Principal Problem:   Acute respiratory failure with hypoxia Active Problems:   Nausea and vomiting   Dehydration   Diabetes   Acute exacerbation of COPD with asthma   CKD  (chronic kidney disease) stage 3, GFR 30-59 ml/min   Time coordinating discharge: 35 minutes  Signed:  Murray Hodgkins, MD [facility] 09/05/2014, 10:13 AM

## 2014-09-05 NOTE — Progress Notes (Signed)
Patient with orders to be discharge home. Discharge instructions given, patient verbalized understanding. Nebulizer and oxygen order faxed to [facility]. Portable oxygen delivered to hospital.  Patient stable. Patient left with portable oxygen in private vehicle with daughter.

## 2014-09-05 NOTE — Progress Notes (Signed)
PROGRESS NOTE  Lindsay Cortez [id number] DOB: 06-07-1941 DOA: 09/01/2014 PCP: Glo Herring., MD  Summary: 74 year old woman with history of COPD, predominantly asthma who presented with a weeklong history of cough and productive sputum. Seen 1/4 in the emergency department diagnosed with pneumonia and started on Levaquin. Returned 1/5 with nausea and vomiting. Admitted for COPD exacerbation.  Assessment/Plan: 1. Acute hypoxic respiratory failure secondary to COPD exacerbation, overall improving. She will need home oxygen. 2. COPD exacerbation. She has a history of asthma, never smoked but significant exposure to secondhand smoke and she has emphysematous changes on chest x-ray. On albuterol nebulizer and Symbicort at home.  3. Nausea and vomiting secondary to acute illness, resolved. 4. Diabetes mellitus type 2, stabilizing on oral prednisone. 5. Suspected chronic kidney disease stage III. No further inpatient evaluation suggested.  6. Possible pancreatic mass on previous CT scan 03/2014. CA 19-9 within normal limits. Follow-up with MRI as an outpatient.   Overall much improved. Plan home today on home oxygen continuous, steroid taper, completed antibiotics.  Consider outpatient PFTs, consider Spiriva if clinically indicated. Deferred to the outpatient setting.  Could consider stopping metoprolol based on recovery of respiratory function.  Consider further evaluation for suspected chronic kidney disease stage III as an outpatient.  Review of record notable for possible pancreatic mass on previous CT scan 03/2014. CA 19-9 within normal limits. Patient knows about this and reports her doctor has plans for repeat imaging in April.  In fact, she saw GI at [facility] for biopsy but at that time it was felt to be too small.   Code Status: full code DVT prophylaxis: Lovenox Family Communication:  Disposition Plan: home  Murray Hodgkins, MD  [facility]  Pager [phone] If 7PM-7AM,  please contact night-coverage at [email or web address], password [facility] 09/05/2014, 9:47 AM  LOS: 4 days   Consultants:    Procedures:    Antibiotics: Ceftriaxone 1/5  >> 1/7 Ceftin 1/8 >> 1/11 Zithromax 1/5  >> 1/9  HPI/Subjective: Hypoxic on room air.  A little nervous from albuterol, but overall doing ok, breathing ok, managing with ambulation, ready to go home. Eating well.  Objective: Filed Vitals:   09/04/14 2101 09/05/14 0103 09/05/14 0628 09/05/14 0707  BP: 142/72   155/58  Pulse: 108   107  Temp: 98.4 F (36.9 C)   98.3 F (36.8 C)  TempSrc: Oral   Oral  Resp: 20   20  Height:      Weight:      SpO2: 96% 95% 95% 94%    Intake/Output Summary (Last 24 hours) at 09/05/14 0947 Last data filed at 09/05/14 0707  Gross per 24 hour  Intake    240 ml  Output   1800 ml  Net  -1560 ml     Filed Weights   09/01/14 1301 09/01/14 1834  Weight: 69.4 kg (153 lb) 69.4 kg (153 lb)    Exam:     Afebrile, VSS, hypoxic, stable on 2, 3 L. General:  Appears calm and comfortable, mildly nervous Cardiovascular: RRR, no m/r/g. No LE edema. Respiratory: CTA bilaterally, no w/r/r. Normal respiratory effort. Good air movement, speaks in full sentences. Psychiatric: grossly normal mood and affect, speech fluent and appropriate  Data Reviewed:  Urine output 1450  Capillary blood sugars improving on oral steroids.  Pertinent data: Admission Labs  Complete metabolic panel and CBC unremarkable  Imaging   Chest x-ray chronic emphysematous changes.  Other Urinalysis negative   Pending data:  none  Scheduled Meds: .  allopurinol  200 mg Oral Daily  . amLODipine  5 mg Oral Daily  . antiseptic oral rinse  7 mL Mouth Rinse BID  . azithromycin  500 mg Oral q1800  . cefUROXime  500 mg Oral BID WC  . cholecalciferol  2,000 Units Oral Daily  . enoxaparin (LOVENOX) injection  30 mg Subcutaneous Q24H  . glimepiride  2 mg Oral QHS  . glimepiride  4 mg Oral Q breakfast  .  insulin aspart  0-15 Units Subcutaneous TID WC  . insulin aspart  0-5 Units Subcutaneous QHS  . insulin aspart  3 Units Subcutaneous TID WC  . ipratropium-albuterol  3 mL Nebulization Q6H  . lisinopril  5 mg Oral Daily  . loperamide  2 mg Oral Daily  . metoprolol  50 mg Oral Daily  . predniSONE  40 mg Oral Q breakfast  . simvastatin  10 mg Oral QHS   Continuous Infusions:    Principal Problem:   Acute respiratory failure with hypoxia Active Problems:   Nausea and vomiting   Dehydration   Diabetes   Acute exacerbation of COPD with asthma   CKD (chronic kidney disease) stage 3, GFR 30-59 ml/min

## 2014-09-08 DIAGNOSIS — E119 Type 2 diabetes mellitus without complications: Secondary | ICD-10-CM | POA: Diagnosis not present

## 2014-09-08 DIAGNOSIS — J441 Chronic obstructive pulmonary disease with (acute) exacerbation: Secondary | ICD-10-CM | POA: Diagnosis not present

## 2014-09-08 DIAGNOSIS — Z8701 Personal history of pneumonia (recurrent): Secondary | ICD-10-CM | POA: Diagnosis not present

## 2014-09-08 DIAGNOSIS — I10 Essential (primary) hypertension: Secondary | ICD-10-CM | POA: Diagnosis not present

## 2014-09-08 DIAGNOSIS — J45909 Unspecified asthma, uncomplicated: Secondary | ICD-10-CM | POA: Diagnosis not present

## 2014-09-08 NOTE — Care Management Utilization Note (Signed)
UR completed 

## 2014-09-11 DIAGNOSIS — Z8701 Personal history of pneumonia (recurrent): Secondary | ICD-10-CM | POA: Diagnosis not present

## 2014-09-11 DIAGNOSIS — I10 Essential (primary) hypertension: Secondary | ICD-10-CM | POA: Diagnosis not present

## 2014-09-11 DIAGNOSIS — E119 Type 2 diabetes mellitus without complications: Secondary | ICD-10-CM | POA: Diagnosis not present

## 2014-09-11 DIAGNOSIS — J45909 Unspecified asthma, uncomplicated: Secondary | ICD-10-CM | POA: Diagnosis not present

## 2014-09-11 DIAGNOSIS — J441 Chronic obstructive pulmonary disease with (acute) exacerbation: Secondary | ICD-10-CM | POA: Diagnosis not present

## 2014-09-15 DIAGNOSIS — E119 Type 2 diabetes mellitus without complications: Secondary | ICD-10-CM | POA: Diagnosis not present

## 2014-09-15 DIAGNOSIS — I10 Essential (primary) hypertension: Secondary | ICD-10-CM | POA: Diagnosis not present

## 2014-09-15 DIAGNOSIS — J441 Chronic obstructive pulmonary disease with (acute) exacerbation: Secondary | ICD-10-CM | POA: Diagnosis not present

## 2014-09-15 DIAGNOSIS — J45909 Unspecified asthma, uncomplicated: Secondary | ICD-10-CM | POA: Diagnosis not present

## 2014-09-15 DIAGNOSIS — Z8701 Personal history of pneumonia (recurrent): Secondary | ICD-10-CM | POA: Diagnosis not present

## 2014-09-16 ENCOUNTER — Other Ambulatory Visit (HOSPITAL_COMMUNITY): Payer: Self-pay | Admitting: Internal Medicine

## 2014-09-16 DIAGNOSIS — E1140 Type 2 diabetes mellitus with diabetic neuropathy, unspecified: Secondary | ICD-10-CM | POA: Diagnosis not present

## 2014-09-16 DIAGNOSIS — Z6828 Body mass index (BMI) 28.0-28.9, adult: Secondary | ICD-10-CM | POA: Diagnosis not present

## 2014-09-16 DIAGNOSIS — M150 Primary generalized (osteo)arthritis: Secondary | ICD-10-CM

## 2014-09-16 DIAGNOSIS — I10 Essential (primary) hypertension: Secondary | ICD-10-CM | POA: Diagnosis not present

## 2014-09-16 DIAGNOSIS — J449 Chronic obstructive pulmonary disease, unspecified: Secondary | ICD-10-CM | POA: Diagnosis not present

## 2014-09-16 DIAGNOSIS — E538 Deficiency of other specified B group vitamins: Secondary | ICD-10-CM | POA: Diagnosis not present

## 2014-09-16 DIAGNOSIS — E1129 Type 2 diabetes mellitus with other diabetic kidney complication: Secondary | ICD-10-CM | POA: Diagnosis not present

## 2014-09-16 DIAGNOSIS — G894 Chronic pain syndrome: Secondary | ICD-10-CM | POA: Diagnosis not present

## 2014-09-16 DIAGNOSIS — E663 Overweight: Secondary | ICD-10-CM | POA: Diagnosis not present

## 2014-09-17 DIAGNOSIS — I10 Essential (primary) hypertension: Secondary | ICD-10-CM | POA: Diagnosis not present

## 2014-09-17 DIAGNOSIS — J441 Chronic obstructive pulmonary disease with (acute) exacerbation: Secondary | ICD-10-CM | POA: Diagnosis not present

## 2014-09-17 DIAGNOSIS — E119 Type 2 diabetes mellitus without complications: Secondary | ICD-10-CM | POA: Diagnosis not present

## 2014-09-17 DIAGNOSIS — Z8701 Personal history of pneumonia (recurrent): Secondary | ICD-10-CM | POA: Diagnosis not present

## 2014-09-17 DIAGNOSIS — J45909 Unspecified asthma, uncomplicated: Secondary | ICD-10-CM | POA: Diagnosis not present

## 2014-09-22 ENCOUNTER — Other Ambulatory Visit (HOSPITAL_COMMUNITY): Payer: Medicare Other

## 2014-09-24 DIAGNOSIS — J45909 Unspecified asthma, uncomplicated: Secondary | ICD-10-CM | POA: Diagnosis not present

## 2014-09-24 DIAGNOSIS — E119 Type 2 diabetes mellitus without complications: Secondary | ICD-10-CM | POA: Diagnosis not present

## 2014-09-24 DIAGNOSIS — Z8701 Personal history of pneumonia (recurrent): Secondary | ICD-10-CM | POA: Diagnosis not present

## 2014-09-24 DIAGNOSIS — I10 Essential (primary) hypertension: Secondary | ICD-10-CM | POA: Diagnosis not present

## 2014-09-24 DIAGNOSIS — J441 Chronic obstructive pulmonary disease with (acute) exacerbation: Secondary | ICD-10-CM | POA: Diagnosis not present

## 2014-10-02 ENCOUNTER — Ambulatory Visit (HOSPITAL_COMMUNITY)
Admission: RE | Admit: 2014-10-02 | Discharge: 2014-10-02 | Disposition: A | Discharge status: Home / Self Care | Payer: Medicare Other | Source: Ambulatory Visit | Attending: Internal Medicine | Admitting: Internal Medicine

## 2014-10-02 DIAGNOSIS — M150 Primary generalized (osteo)arthritis: Secondary | ICD-10-CM | POA: Insufficient documentation

## 2014-10-02 DIAGNOSIS — M8580 Other specified disorders of bone density and structure, unspecified site: Secondary | ICD-10-CM | POA: Diagnosis not present

## 2014-10-08 DIAGNOSIS — I129 Hypertensive chronic kidney disease with stage 1 through stage 4 chronic kidney disease, or unspecified chronic kidney disease: Secondary | ICD-10-CM | POA: Diagnosis not present

## 2014-10-08 DIAGNOSIS — E559 Vitamin D deficiency, unspecified: Secondary | ICD-10-CM | POA: Diagnosis not present

## 2014-10-08 DIAGNOSIS — N183 Chronic kidney disease, stage 3 (moderate): Secondary | ICD-10-CM | POA: Diagnosis not present

## 2014-10-08 DIAGNOSIS — D649 Anemia, unspecified: Secondary | ICD-10-CM | POA: Diagnosis not present

## 2014-10-08 DIAGNOSIS — Z79899 Other long term (current) drug therapy: Secondary | ICD-10-CM | POA: Diagnosis not present

## 2014-10-08 DIAGNOSIS — R809 Proteinuria, unspecified: Secondary | ICD-10-CM | POA: Diagnosis not present

## 2014-10-09 DIAGNOSIS — M25511 Pain in right shoulder: Secondary | ICD-10-CM | POA: Diagnosis not present

## 2014-10-14 DIAGNOSIS — R809 Proteinuria, unspecified: Secondary | ICD-10-CM | POA: Diagnosis not present

## 2014-10-14 DIAGNOSIS — I10 Essential (primary) hypertension: Secondary | ICD-10-CM | POA: Diagnosis not present

## 2014-10-14 DIAGNOSIS — N183 Chronic kidney disease, stage 3 (moderate): Secondary | ICD-10-CM | POA: Diagnosis not present

## 2014-10-19 ENCOUNTER — Encounter (INDEPENDENT_AMBULATORY_CARE_PROVIDER_SITE_OTHER): Payer: Self-pay | Admitting: *Deleted

## 2014-10-22 DIAGNOSIS — D519 Vitamin B12 deficiency anemia, unspecified: Secondary | ICD-10-CM | POA: Diagnosis not present

## 2014-10-29 ENCOUNTER — Telehealth (INDEPENDENT_AMBULATORY_CARE_PROVIDER_SITE_OTHER): Payer: Self-pay | Admitting: Internal Medicine

## 2014-10-29 DIAGNOSIS — K862 Cyst of pancreas: Secondary | ICD-10-CM

## 2014-10-29 NOTE — Telephone Encounter (Signed)
Order for MRI abdomen ordered for f/u

## 2014-10-29 NOTE — Telephone Encounter (Signed)
MIR ordered

## 2014-10-29 NOTE — Addendum Note (Signed)
Addended by: Butch Penny on: 10/29/2014 12:12 PM   Modules accepted: Orders

## 2014-11-06 DIAGNOSIS — Z1231 Encounter for screening mammogram for malignant neoplasm of breast: Secondary | ICD-10-CM | POA: Diagnosis not present

## 2014-11-19 ENCOUNTER — Other Ambulatory Visit: Payer: Medicare Other

## 2014-11-25 ENCOUNTER — Other Ambulatory Visit (INDEPENDENT_AMBULATORY_CARE_PROVIDER_SITE_OTHER): Payer: Self-pay | Admitting: Internal Medicine

## 2014-11-25 ENCOUNTER — Ambulatory Visit
Admission: RE | Admit: 2014-11-25 | Discharge: 2014-11-25 | Disposition: A | Discharge status: Home / Self Care | Payer: Medicare Other | Source: Ambulatory Visit | Attending: Internal Medicine | Admitting: Internal Medicine

## 2014-11-25 DIAGNOSIS — K862 Cyst of pancreas: Secondary | ICD-10-CM

## 2014-11-25 DIAGNOSIS — D510 Vitamin B12 deficiency anemia due to intrinsic factor deficiency: Secondary | ICD-10-CM | POA: Diagnosis not present

## 2014-11-25 DIAGNOSIS — K828 Other specified diseases of gallbladder: Secondary | ICD-10-CM | POA: Diagnosis not present

## 2014-11-25 DIAGNOSIS — N281 Cyst of kidney, acquired: Secondary | ICD-10-CM | POA: Diagnosis not present

## 2014-11-25 DIAGNOSIS — K838 Other specified diseases of biliary tract: Secondary | ICD-10-CM | POA: Diagnosis not present

## 2014-12-08 ENCOUNTER — Encounter (INDEPENDENT_AMBULATORY_CARE_PROVIDER_SITE_OTHER): Payer: Self-pay | Admitting: Internal Medicine

## 2014-12-08 ENCOUNTER — Ambulatory Visit (INDEPENDENT_AMBULATORY_CARE_PROVIDER_SITE_OTHER): Payer: Medicare Other | Admitting: Internal Medicine

## 2014-12-08 VITALS — BP 140/70 | HR 76 | Temp 98.1°F | Resp 18 | Ht 59.0 in | Wt 153.1 lb

## 2014-12-08 DIAGNOSIS — K8020 Calculus of gallbladder without cholecystitis without obstruction: Secondary | ICD-10-CM

## 2014-12-08 DIAGNOSIS — K862 Cyst of pancreas: Secondary | ICD-10-CM | POA: Diagnosis not present

## 2014-12-08 NOTE — Progress Notes (Signed)
Presenting complaint;  Follow-up for pancreatic cyst and biliary tract disease. History of weight loss.   Database;  Patient is 74 year old Caucasian female was initially seen in September 2015 because of pancreatic cystic lesion in dilated common bile duct with stones as well as cholelithiasis. She was referred by Dr. Gerarda Fraction because of 30 pound weight loss. With these findings are felt patient needed both EUS and ERCP and they could be accomplished at the same time and she was therefore referred to [facility]. She had endoscopic ultrasound by Dr. Jerl Santos and pancreatic cyst at the criterion for benign cyst. Bile duct was dilated and no stones are identified implying that she had passed stones spontaneously. Patient was advised to undergo repeat MR in 6 months which she just had prior to this visit.   Subjective:  She feels well. She has not lost more weight. She denies abdominal pain nausea vomiting melena or rectal bleeding. She has history of chronic diarrhea for which she has been evaluated in the past. She says she takes loperamide 2 mg every morning and she has normal stools. She also denies chest or scapular pain. She is aware that she has cholelithiasis.   Current Medications: Outpatient Encounter Prescriptions as of 12/08/2014  Medication Sig  . acetaminophen (TYLENOL) 500 MG tablet Take 500 mg by mouth daily as needed (pain). For pain  . albuterol (PROVENTIL) (2.5 MG/3ML) 0.083% nebulizer solution Take 3 mLs (2.5 mg total) by nebulization every 4 (four) hours as needed for wheezing.  Marland Kitchen albuterol (VENTOLIN HFA) 108 (90 BASE) MCG/ACT inhaler Inhale 2 puffs into the lungs every 4 (four) hours as needed for wheezing or shortness of breath. For rescue  . allopurinol (ZYLOPRIM) 100 MG tablet Take 200 mg by mouth every morning.    Marland Kitchen amLODipine (NORVASC) 5 MG tablet Take 5 mg by mouth daily.  . budesonide-formoterol (SYMBICORT) 80-4.5 MCG/ACT inhaler Inhale 2 puffs into the lungs 2 (two)  times daily.   . Calcium Carbonate-Vitamin D (CALCIUM 600 + D PO) Take 1 tablet by mouth daily.    . Cholecalciferol (VITAMIN D) 2000 UNITS CAPS Take 1 capsule by mouth every morning.    Marland Kitchen glimepiride (AMARYL) 2 MG tablet Take 2-4 mg by mouth 2 (two) times daily. 2 in the morning and 1 at bedtime  . lisinopril (PRINIVIL,ZESTRIL) 5 MG tablet Take 5 mg by mouth daily.  Marland Kitchen loperamide (IMODIUM) 2 MG capsule Take 2 mg by mouth every morning.    . metoprolol (LOPRESSOR) 50 MG tablet Take 50 mg by mouth daily.    . simvastatin (ZOCOR) 10 MG tablet Take 10 mg by mouth at bedtime.    . [DISCONTINUED] cefUROXime (CEFTIN) 500 MG tablet Take 1 tablet (500 mg total) by mouth 2 (two) times daily with a meal. (Patient not taking: Reported on 12/08/2014)  . [DISCONTINUED] predniSONE (DELTASONE) 10 MG tablet Take 40 mg by mouth daily for 3 days, then take 20 mg by mouth daily for 3 days, then take 10 mg by mouth daily for 3 days, then stop. (Patient not taking: Reported on 12/08/2014)     Objective: Blood pressure 140/70, pulse 76, temperature 98.1 F (36.7 C), temperature source Oral, resp. rate 18, height 4\' 11"  (1.499 m), weight 153 lb 1.6 oz (69.446 kg). Patient is alert and in no acute distress. Conjunctiva is pink. Sclera is nonicteric Oropharyngeal mucosa is normal. No neck masses or thyromegaly noted. Cardiac exam with regular rhythm normal S1 and S2. No murmur or gallop noted.  Lungs are clear to auscultation. Abdomen symmetrical soft and nontender without organomegaly or masses.  No LE edema or clubbing noted.  Labs/studies Results: MR abdomen from 11/30/2014  Pancreatic cyst appears to be stable since previous study   of 04/23/2014. It measures 12 x 20 mm Bile duct is dilated measuring 1314 mm but no evidence of common duct stones. Gallstones noted without wall thickening.  Assessment:  #1. Pancreatic cyst. By EUS criteria and this is a benign cyst and on follow-up MRI of this cyst has not  increased in size. Therefore it just needs to be followed with one more study in one year. #2. Dilated bile duct. She has history of choledocholithiasis which she passed spontaneously and did not require ERCP at the time of EUS. Resume she has no biliary symptoms. #3. Asymptomatic cholelithiasis. #4. History of weight loss. She has not lost any more weight in the last 7 months. Etiology of prior weight loss unclear will not pursue with further workup unless she starts to lose more weight.   Plan:  Patient advised to call office if she has abdominal chest or right shoulder pain or nausea and vomiting. Otherwise she will return for office visit in 1 year.

## 2014-12-08 NOTE — Patient Instructions (Signed)
Call if you have abdominal pain nausea or vomiting.

## 2014-12-31 DIAGNOSIS — I10 Essential (primary) hypertension: Secondary | ICD-10-CM | POA: Diagnosis not present

## 2014-12-31 DIAGNOSIS — E1129 Type 2 diabetes mellitus with other diabetic kidney complication: Secondary | ICD-10-CM | POA: Diagnosis not present

## 2014-12-31 DIAGNOSIS — E1140 Type 2 diabetes mellitus with diabetic neuropathy, unspecified: Secondary | ICD-10-CM | POA: Diagnosis not present

## 2014-12-31 DIAGNOSIS — N183 Chronic kidney disease, stage 3 (moderate): Secondary | ICD-10-CM | POA: Diagnosis not present

## 2014-12-31 DIAGNOSIS — Z6831 Body mass index (BMI) 31.0-31.9, adult: Secondary | ICD-10-CM | POA: Diagnosis not present

## 2014-12-31 DIAGNOSIS — Z0001 Encounter for general adult medical examination with abnormal findings: Secondary | ICD-10-CM | POA: Diagnosis not present

## 2014-12-31 DIAGNOSIS — E6609 Other obesity due to excess calories: Secondary | ICD-10-CM | POA: Diagnosis not present

## 2015-01-20 DIAGNOSIS — Z79899 Other long term (current) drug therapy: Secondary | ICD-10-CM | POA: Diagnosis not present

## 2015-01-20 DIAGNOSIS — E559 Vitamin D deficiency, unspecified: Secondary | ICD-10-CM | POA: Diagnosis not present

## 2015-01-20 DIAGNOSIS — N183 Chronic kidney disease, stage 3 (moderate): Secondary | ICD-10-CM | POA: Diagnosis not present

## 2015-01-20 DIAGNOSIS — D649 Anemia, unspecified: Secondary | ICD-10-CM | POA: Diagnosis not present

## 2015-01-20 DIAGNOSIS — I129 Hypertensive chronic kidney disease with stage 1 through stage 4 chronic kidney disease, or unspecified chronic kidney disease: Secondary | ICD-10-CM | POA: Diagnosis not present

## 2015-01-20 DIAGNOSIS — R809 Proteinuria, unspecified: Secondary | ICD-10-CM | POA: Diagnosis not present

## 2015-01-22 DIAGNOSIS — D510 Vitamin B12 deficiency anemia due to intrinsic factor deficiency: Secondary | ICD-10-CM | POA: Diagnosis not present

## 2015-01-27 DIAGNOSIS — N183 Chronic kidney disease, stage 3 (moderate): Secondary | ICD-10-CM | POA: Diagnosis not present

## 2015-01-27 DIAGNOSIS — I10 Essential (primary) hypertension: Secondary | ICD-10-CM | POA: Diagnosis not present

## 2015-01-27 DIAGNOSIS — R809 Proteinuria, unspecified: Secondary | ICD-10-CM | POA: Diagnosis not present

## 2015-02-15 DIAGNOSIS — G894 Chronic pain syndrome: Secondary | ICD-10-CM | POA: Diagnosis not present

## 2015-02-15 DIAGNOSIS — M545 Low back pain: Secondary | ICD-10-CM | POA: Diagnosis not present

## 2015-02-15 DIAGNOSIS — M5137 Other intervertebral disc degeneration, lumbosacral region: Secondary | ICD-10-CM | POA: Diagnosis not present

## 2015-02-15 DIAGNOSIS — M4807 Spinal stenosis, lumbosacral region: Secondary | ICD-10-CM | POA: Diagnosis not present

## 2015-02-16 DIAGNOSIS — D511 Vitamin B12 deficiency anemia due to selective vitamin B12 malabsorption with proteinuria: Secondary | ICD-10-CM | POA: Diagnosis not present

## 2015-03-18 DIAGNOSIS — D510 Vitamin B12 deficiency anemia due to intrinsic factor deficiency: Secondary | ICD-10-CM | POA: Diagnosis not present

## 2015-04-05 DIAGNOSIS — J069 Acute upper respiratory infection, unspecified: Secondary | ICD-10-CM | POA: Diagnosis not present

## 2015-04-05 DIAGNOSIS — J329 Chronic sinusitis, unspecified: Secondary | ICD-10-CM | POA: Diagnosis not present

## 2015-04-19 DIAGNOSIS — D510 Vitamin B12 deficiency anemia due to intrinsic factor deficiency: Secondary | ICD-10-CM | POA: Diagnosis not present

## 2015-05-19 DIAGNOSIS — D510 Vitamin B12 deficiency anemia due to intrinsic factor deficiency: Secondary | ICD-10-CM | POA: Diagnosis not present

## 2015-05-24 DIAGNOSIS — Z6831 Body mass index (BMI) 31.0-31.9, adult: Secondary | ICD-10-CM | POA: Diagnosis not present

## 2015-05-24 DIAGNOSIS — N183 Chronic kidney disease, stage 3 (moderate): Secondary | ICD-10-CM | POA: Diagnosis not present

## 2015-05-24 DIAGNOSIS — M1991 Primary osteoarthritis, unspecified site: Secondary | ICD-10-CM | POA: Diagnosis not present

## 2015-05-24 DIAGNOSIS — E6609 Other obesity due to excess calories: Secondary | ICD-10-CM | POA: Diagnosis not present

## 2015-05-24 DIAGNOSIS — J449 Chronic obstructive pulmonary disease, unspecified: Secondary | ICD-10-CM | POA: Diagnosis not present

## 2015-05-24 DIAGNOSIS — Z1389 Encounter for screening for other disorder: Secondary | ICD-10-CM | POA: Diagnosis not present

## 2015-05-24 DIAGNOSIS — E1129 Type 2 diabetes mellitus with other diabetic kidney complication: Secondary | ICD-10-CM | POA: Diagnosis not present

## 2015-05-24 DIAGNOSIS — E1140 Type 2 diabetes mellitus with diabetic neuropathy, unspecified: Secondary | ICD-10-CM | POA: Diagnosis not present

## 2015-05-24 DIAGNOSIS — I10 Essential (primary) hypertension: Secondary | ICD-10-CM | POA: Diagnosis not present

## 2015-05-31 DIAGNOSIS — Z23 Encounter for immunization: Secondary | ICD-10-CM | POA: Diagnosis not present

## 2015-06-03 DIAGNOSIS — N183 Chronic kidney disease, stage 3 (moderate): Secondary | ICD-10-CM | POA: Diagnosis not present

## 2015-06-03 DIAGNOSIS — R809 Proteinuria, unspecified: Secondary | ICD-10-CM | POA: Diagnosis not present

## 2015-06-03 DIAGNOSIS — E559 Vitamin D deficiency, unspecified: Secondary | ICD-10-CM | POA: Diagnosis not present

## 2015-06-03 DIAGNOSIS — I129 Hypertensive chronic kidney disease with stage 1 through stage 4 chronic kidney disease, or unspecified chronic kidney disease: Secondary | ICD-10-CM | POA: Diagnosis not present

## 2015-06-03 DIAGNOSIS — D649 Anemia, unspecified: Secondary | ICD-10-CM | POA: Diagnosis not present

## 2015-06-03 DIAGNOSIS — Z79899 Other long term (current) drug therapy: Secondary | ICD-10-CM | POA: Diagnosis not present

## 2015-06-09 DIAGNOSIS — I10 Essential (primary) hypertension: Secondary | ICD-10-CM | POA: Diagnosis not present

## 2015-06-09 DIAGNOSIS — R809 Proteinuria, unspecified: Secondary | ICD-10-CM | POA: Diagnosis not present

## 2015-06-09 DIAGNOSIS — N183 Chronic kidney disease, stage 3 (moderate): Secondary | ICD-10-CM | POA: Diagnosis not present

## 2015-07-19 DIAGNOSIS — D510 Vitamin B12 deficiency anemia due to intrinsic factor deficiency: Secondary | ICD-10-CM | POA: Diagnosis not present

## 2015-07-29 DIAGNOSIS — M5137 Other intervertebral disc degeneration, lumbosacral region: Secondary | ICD-10-CM | POA: Diagnosis not present

## 2015-07-29 DIAGNOSIS — M4807 Spinal stenosis, lumbosacral region: Secondary | ICD-10-CM | POA: Diagnosis not present

## 2015-07-29 DIAGNOSIS — G894 Chronic pain syndrome: Secondary | ICD-10-CM | POA: Diagnosis not present

## 2015-07-29 DIAGNOSIS — M4726 Other spondylosis with radiculopathy, lumbar region: Secondary | ICD-10-CM | POA: Diagnosis not present

## 2015-07-29 DIAGNOSIS — M545 Low back pain: Secondary | ICD-10-CM | POA: Diagnosis not present

## 2015-08-16 DIAGNOSIS — J329 Chronic sinusitis, unspecified: Secondary | ICD-10-CM | POA: Diagnosis not present

## 2015-08-16 DIAGNOSIS — J069 Acute upper respiratory infection, unspecified: Secondary | ICD-10-CM | POA: Diagnosis not present

## 2015-08-19 DIAGNOSIS — D510 Vitamin B12 deficiency anemia due to intrinsic factor deficiency: Secondary | ICD-10-CM | POA: Diagnosis not present

## 2015-08-23 DIAGNOSIS — J329 Chronic sinusitis, unspecified: Secondary | ICD-10-CM | POA: Diagnosis not present

## 2015-09-01 DIAGNOSIS — N183 Chronic kidney disease, stage 3 (moderate): Secondary | ICD-10-CM | POA: Diagnosis not present

## 2015-09-01 DIAGNOSIS — Z79899 Other long term (current) drug therapy: Secondary | ICD-10-CM | POA: Diagnosis not present

## 2015-09-01 DIAGNOSIS — E559 Vitamin D deficiency, unspecified: Secondary | ICD-10-CM | POA: Diagnosis not present

## 2015-09-01 DIAGNOSIS — R809 Proteinuria, unspecified: Secondary | ICD-10-CM | POA: Diagnosis not present

## 2015-09-01 DIAGNOSIS — I10 Essential (primary) hypertension: Secondary | ICD-10-CM | POA: Diagnosis not present

## 2015-09-01 DIAGNOSIS — D509 Iron deficiency anemia, unspecified: Secondary | ICD-10-CM | POA: Diagnosis not present

## 2015-09-07 DIAGNOSIS — I10 Essential (primary) hypertension: Secondary | ICD-10-CM | POA: Diagnosis not present

## 2015-09-07 DIAGNOSIS — E039 Hypothyroidism, unspecified: Secondary | ICD-10-CM | POA: Diagnosis not present

## 2015-09-07 DIAGNOSIS — E6609 Other obesity due to excess calories: Secondary | ICD-10-CM | POA: Diagnosis not present

## 2015-09-07 DIAGNOSIS — Z1389 Encounter for screening for other disorder: Secondary | ICD-10-CM | POA: Diagnosis not present

## 2015-09-07 DIAGNOSIS — E119 Type 2 diabetes mellitus without complications: Secondary | ICD-10-CM | POA: Diagnosis not present

## 2015-09-07 DIAGNOSIS — J209 Acute bronchitis, unspecified: Secondary | ICD-10-CM | POA: Diagnosis not present

## 2015-09-07 DIAGNOSIS — Z6831 Body mass index (BMI) 31.0-31.9, adult: Secondary | ICD-10-CM | POA: Diagnosis not present

## 2015-09-07 DIAGNOSIS — J45909 Unspecified asthma, uncomplicated: Secondary | ICD-10-CM | POA: Diagnosis not present

## 2015-09-08 DIAGNOSIS — E0590 Thyrotoxicosis, unspecified without thyrotoxic crisis or storm: Secondary | ICD-10-CM | POA: Diagnosis not present

## 2015-09-08 DIAGNOSIS — I10 Essential (primary) hypertension: Secondary | ICD-10-CM | POA: Diagnosis not present

## 2015-09-08 DIAGNOSIS — N183 Chronic kidney disease, stage 3 (moderate): Secondary | ICD-10-CM | POA: Diagnosis not present

## 2015-09-10 ENCOUNTER — Encounter (INDEPENDENT_AMBULATORY_CARE_PROVIDER_SITE_OTHER): Payer: Self-pay | Admitting: Internal Medicine

## 2015-09-10 DIAGNOSIS — Z1211 Encounter for screening for malignant neoplasm of colon: Secondary | ICD-10-CM | POA: Diagnosis not present

## 2015-09-16 DIAGNOSIS — D510 Vitamin B12 deficiency anemia due to intrinsic factor deficiency: Secondary | ICD-10-CM | POA: Diagnosis not present

## 2015-09-22 DIAGNOSIS — N183 Chronic kidney disease, stage 3 (moderate): Secondary | ICD-10-CM | POA: Diagnosis not present

## 2015-09-22 DIAGNOSIS — I10 Essential (primary) hypertension: Secondary | ICD-10-CM | POA: Diagnosis not present

## 2015-09-22 DIAGNOSIS — Z79899 Other long term (current) drug therapy: Secondary | ICD-10-CM | POA: Diagnosis not present

## 2015-09-28 ENCOUNTER — Encounter: Payer: Self-pay | Admitting: Internal Medicine

## 2015-09-30 DIAGNOSIS — E0590 Thyrotoxicosis, unspecified without thyrotoxic crisis or storm: Secondary | ICD-10-CM | POA: Diagnosis not present

## 2015-10-01 ENCOUNTER — Other Ambulatory Visit (HOSPITAL_COMMUNITY): Payer: Self-pay | Admitting: Endocrinology

## 2015-10-01 DIAGNOSIS — E0590 Thyrotoxicosis, unspecified without thyrotoxic crisis or storm: Secondary | ICD-10-CM

## 2015-10-04 ENCOUNTER — Other Ambulatory Visit (INDEPENDENT_AMBULATORY_CARE_PROVIDER_SITE_OTHER): Payer: Self-pay | Admitting: Internal Medicine

## 2015-10-04 ENCOUNTER — Telehealth (INDEPENDENT_AMBULATORY_CARE_PROVIDER_SITE_OTHER): Payer: Self-pay | Admitting: *Deleted

## 2015-10-04 ENCOUNTER — Encounter (HOSPITAL_COMMUNITY)
Admission: RE | Admit: 2015-10-04 | Discharge: 2015-10-04 | Disposition: A | Discharge status: Home / Self Care | Payer: Medicare Other | Source: Ambulatory Visit | Attending: Endocrinology | Admitting: Endocrinology

## 2015-10-04 DIAGNOSIS — E0590 Thyrotoxicosis, unspecified without thyrotoxic crisis or storm: Secondary | ICD-10-CM | POA: Insufficient documentation

## 2015-10-04 DIAGNOSIS — K862 Cyst of pancreas: Secondary | ICD-10-CM

## 2015-10-04 MED ORDER — SODIUM IODIDE I 131 CAPSULE
8.0000 | Freq: Once | INTRAVENOUS | Status: AC | PRN
  Administered 2015-10-04: 8 via ORAL

## 2015-10-04 NOTE — Telephone Encounter (Addendum)
Patient has called in, wanting to schedule her MRI which is due in March, please put in order: she wants to go where she went last year -- when you put order in make sure location is [facility] @ [address]  (Notes Recorded by Butch Penny, NP on 11/26/2014 at 4:34 PM She will be on a recall for 12 months for a repeat MRI abdomen without contrast. Lelon Frohlich, recall for MRI abdomen without contrast for f/u of pancreatic cystic lesions.)

## 2015-10-04 NOTE — Telephone Encounter (Signed)
[facility] will contact patient to schedule

## 2015-10-04 NOTE — Telephone Encounter (Signed)
Order placed

## 2015-10-05 ENCOUNTER — Encounter (HOSPITAL_COMMUNITY)
Admission: RE | Admit: 2015-10-05 | Discharge: 2015-10-05 | Disposition: A | Discharge status: Home / Self Care | Payer: Medicare Other | Source: Ambulatory Visit | Attending: Endocrinology | Admitting: Endocrinology

## 2015-10-05 DIAGNOSIS — E042 Nontoxic multinodular goiter: Secondary | ICD-10-CM | POA: Diagnosis not present

## 2015-10-05 DIAGNOSIS — E0590 Thyrotoxicosis, unspecified without thyrotoxic crisis or storm: Secondary | ICD-10-CM | POA: Diagnosis not present

## 2015-10-05 MED ORDER — SODIUM PERTECHNETATE TC 99M INJECTION
10.0000 | Freq: Once | INTRAVENOUS | Status: AC | PRN
  Administered 2015-10-05: 10 via INTRAVENOUS

## 2015-10-13 DIAGNOSIS — Z79899 Other long term (current) drug therapy: Secondary | ICD-10-CM | POA: Diagnosis not present

## 2015-10-13 DIAGNOSIS — I10 Essential (primary) hypertension: Secondary | ICD-10-CM | POA: Diagnosis not present

## 2015-10-13 DIAGNOSIS — N183 Chronic kidney disease, stage 3 (moderate): Secondary | ICD-10-CM | POA: Diagnosis not present

## 2015-10-14 ENCOUNTER — Other Ambulatory Visit (HOSPITAL_COMMUNITY): Payer: Self-pay | Admitting: Endocrinology

## 2015-10-14 DIAGNOSIS — E0590 Thyrotoxicosis, unspecified without thyrotoxic crisis or storm: Secondary | ICD-10-CM

## 2015-10-15 ENCOUNTER — Ambulatory Visit
Admission: RE | Admit: 2015-10-15 | Discharge: 2015-10-15 | Disposition: A | Discharge status: Home / Self Care | Payer: Medicare Other | Source: Ambulatory Visit | Attending: Internal Medicine | Admitting: Internal Medicine

## 2015-10-15 ENCOUNTER — Inpatient Hospital Stay: Admission: RE | Admit: 2015-10-15 | Payer: Medicare Other | Source: Ambulatory Visit

## 2015-10-15 DIAGNOSIS — K862 Cyst of pancreas: Secondary | ICD-10-CM

## 2015-10-18 DIAGNOSIS — D510 Vitamin B12 deficiency anemia due to intrinsic factor deficiency: Secondary | ICD-10-CM | POA: Diagnosis not present

## 2015-10-20 DIAGNOSIS — I10 Essential (primary) hypertension: Secondary | ICD-10-CM | POA: Diagnosis not present

## 2015-10-20 DIAGNOSIS — N179 Acute kidney failure, unspecified: Secondary | ICD-10-CM | POA: Diagnosis not present

## 2015-10-20 DIAGNOSIS — N183 Chronic kidney disease, stage 3 (moderate): Secondary | ICD-10-CM | POA: Diagnosis not present

## 2015-10-22 ENCOUNTER — Encounter (HOSPITAL_COMMUNITY)
Admission: RE | Admit: 2015-10-22 | Discharge: 2015-10-22 | Disposition: A | Discharge status: Home / Self Care | Payer: Medicare Other | Source: Ambulatory Visit | Attending: Endocrinology | Admitting: Endocrinology

## 2015-10-22 DIAGNOSIS — E0590 Thyrotoxicosis, unspecified without thyrotoxic crisis or storm: Secondary | ICD-10-CM

## 2015-10-22 DIAGNOSIS — E042 Nontoxic multinodular goiter: Secondary | ICD-10-CM | POA: Diagnosis not present

## 2015-10-22 MED ORDER — SODIUM IODIDE I 131 CAPSULE
30.0000 | Freq: Once | INTRAVENOUS | Status: AC | PRN
  Administered 2015-10-22: 30 via ORAL

## 2015-11-12 ENCOUNTER — Encounter (HOSPITAL_COMMUNITY): Payer: Self-pay | Admitting: *Deleted

## 2015-11-12 ENCOUNTER — Emergency Department (HOSPITAL_COMMUNITY)
Admission: EM | Admit: 2015-11-12 | Discharge: 2015-11-12 | Disposition: A | Discharge status: Home / Self Care | Payer: Medicare Other | Attending: Emergency Medicine | Admitting: Emergency Medicine

## 2015-11-12 DIAGNOSIS — I10 Essential (primary) hypertension: Secondary | ICD-10-CM | POA: Diagnosis not present

## 2015-11-12 DIAGNOSIS — J449 Chronic obstructive pulmonary disease, unspecified: Secondary | ICD-10-CM | POA: Diagnosis not present

## 2015-11-12 DIAGNOSIS — Z79899 Other long term (current) drug therapy: Secondary | ICD-10-CM | POA: Insufficient documentation

## 2015-11-12 DIAGNOSIS — J45909 Unspecified asthma, uncomplicated: Secondary | ICD-10-CM | POA: Insufficient documentation

## 2015-11-12 DIAGNOSIS — R42 Dizziness and giddiness: Secondary | ICD-10-CM | POA: Insufficient documentation

## 2015-11-12 DIAGNOSIS — Z8701 Personal history of pneumonia (recurrent): Secondary | ICD-10-CM | POA: Diagnosis not present

## 2015-11-12 DIAGNOSIS — E119 Type 2 diabetes mellitus without complications: Secondary | ICD-10-CM | POA: Diagnosis not present

## 2015-11-12 HISTORY — DX: Dizziness and giddiness: R42

## 2015-11-12 LAB — CBC
HCT: 42.3 % (ref 36.0–46.0)
Hemoglobin: 14 g/dL (ref 12.0–15.0)
MCH: 31.5 pg (ref 26.0–34.0)
MCHC: 33.1 g/dL (ref 30.0–36.0)
MCV: 95.1 fL (ref 78.0–100.0)
PLATELETS: 270 10*3/uL (ref 150–400)
RBC: 4.45 MIL/uL (ref 3.87–5.11)
RDW: 14 % (ref 11.5–15.5)
WBC: 14.7 10*3/uL — AB (ref 4.0–10.5)

## 2015-11-12 LAB — URINE MICROSCOPIC-ADD ON: RBC / HPF: NONE SEEN RBC/hpf (ref 0–5)

## 2015-11-12 LAB — COMPREHENSIVE METABOLIC PANEL
ALT: 17 U/L (ref 14–54)
AST: 27 U/L (ref 15–41)
Albumin: 4.2 g/dL (ref 3.5–5.0)
Alkaline Phosphatase: 63 U/L (ref 38–126)
Anion gap: 9 (ref 5–15)
BILIRUBIN TOTAL: 0.8 mg/dL (ref 0.3–1.2)
BUN: 23 mg/dL — AB (ref 6–20)
CHLORIDE: 105 mmol/L (ref 101–111)
CO2: 25 mmol/L (ref 22–32)
CREATININE: 1.53 mg/dL — AB (ref 0.44–1.00)
Calcium: 9.6 mg/dL (ref 8.9–10.3)
GFR calc Af Amer: 37 mL/min — ABNORMAL LOW (ref >60)
GFR, EST NON AFRICAN AMERICAN: 32 mL/min — AB (ref >60)
Glucose, Bld: 157 mg/dL — ABNORMAL HIGH (ref 65–99)
Potassium: 4.2 mmol/L (ref 3.5–5.1)
Sodium: 139 mmol/L (ref 135–145)
TOTAL PROTEIN: 7.3 g/dL (ref 6.5–8.1)

## 2015-11-12 LAB — DIFFERENTIAL
BASOS ABS: 0 10*3/uL (ref 0.0–0.1)
Basophils Relative: 0 %
Eosinophils Absolute: 0.1 10*3/uL (ref 0.0–0.7)
Eosinophils Relative: 1 %
LYMPHS ABS: 1.3 10*3/uL (ref 0.7–4.0)
LYMPHS PCT: 9 %
MONOS PCT: 7 %
Monocytes Absolute: 1.1 10*3/uL — ABNORMAL HIGH (ref 0.1–1.0)
NEUTROS PCT: 83 %
Neutro Abs: 12.3 10*3/uL — ABNORMAL HIGH (ref 1.7–7.7)

## 2015-11-12 LAB — I-STAT TROPONIN, ED: Troponin i, poc: 0 ng/mL (ref 0.00–0.08)

## 2015-11-12 LAB — LIPASE, BLOOD: Lipase: 39 U/L (ref 11–51)

## 2015-11-12 LAB — URINALYSIS, ROUTINE W REFLEX MICROSCOPIC
Glucose, UA: NEGATIVE mg/dL
Hgb urine dipstick: NEGATIVE
Ketones, ur: NEGATIVE mg/dL
Leukocytes, UA: NEGATIVE
Nitrite: NEGATIVE
PH: 5.5 (ref 5.0–8.0)
Protein, ur: 30 mg/dL — AB
SPECIFIC GRAVITY, URINE: 1.025 (ref 1.005–1.030)

## 2015-11-12 MED ORDER — PROMETHAZINE HCL 25 MG RE SUPP: 25.0000 mg | Freq: Four times a day (QID) | RECTAL | Status: DC | PRN

## 2015-11-12 MED ORDER — MECLIZINE HCL 25 MG PO TABS: 25.0000 mg | ORAL_TABLET | Freq: Three times a day (TID) | ORAL | Status: DC | PRN

## 2015-11-12 MED ORDER — SODIUM CHLORIDE 0.9 % IV BOLUS (SEPSIS)
1000.0000 mL | Freq: Once | INTRAVENOUS | Status: AC
  Administered 2015-11-12: 1000 mL via INTRAVENOUS

## 2015-11-12 MED ORDER — PROMETHAZINE HCL 25 MG/ML IJ SOLN: 25.0000 mg | Freq: Once | INTRAMUSCULAR | Status: DC

## 2015-11-12 MED ORDER — MECLIZINE HCL 12.5 MG PO TABS
25.0000 mg | ORAL_TABLET | Freq: Once | ORAL | Status: AC
  Administered 2015-11-12: 25 mg via ORAL
  Filled 2015-11-12: qty 2

## 2015-11-12 MED ORDER — PROMETHAZINE HCL 25 MG/ML IJ SOLN
12.5000 mg | Freq: Once | INTRAMUSCULAR | Status: AC
  Administered 2015-11-12: 12.5 mg via INTRAVENOUS
  Filled 2015-11-12: qty 1

## 2015-11-12 NOTE — ED Notes (Signed)
Patient ambulated to restroom with walker.  Patient was able to ambulate with only assistance of walker.

## 2015-11-12 NOTE — Discharge Instructions (Signed)
Please follow-up with her primary care doctor in 2-3 days for reevaluation. Return without fail for any worsening symptoms, including inability to walk, new speech or vision changes, focal numbness or weakness, or any other symptoms concerning to you  Dizziness Dizziness is a common problem. It is a feeling of unsteadiness or light-headedness. You may feel like you are about to faint. Dizziness can lead to injury if you stumble or fall. Anyone can become dizzy, but dizziness is more common in older adults. This condition can be caused by a number of things, including medicines, dehydration, or illness. HOME CARE INSTRUCTIONS Taking these steps may help with your condition: Eating and Drinking  Drink enough fluid to keep your urine clear or pale yellow. This helps to keep you from becoming dehydrated. Try to drink more clear fluids, such as water.  Do not drink alcohol.  Limit your caffeine intake if directed by your health care provider.  Limit your salt intake if directed by your health care provider. Activity  Avoid making quick movements.  Rise slowly from chairs and steady yourself until you feel okay.  In the morning, first sit up on the side of the bed. When you feel okay, stand slowly while you hold onto something until you know that your balance is fine.  Move your legs often if you need to stand in one place for a long time. Tighten and relax your muscles in your legs while you are standing.  Do not drive or operate heavy machinery if you feel dizzy.  Avoid bending down if you feel dizzy. Place items in your home so that they are easy for you to reach without leaning over. Lifestyle  Do not use any tobacco products, including cigarettes, chewing tobacco, or electronic cigarettes. If you need help quitting, ask your health care provider.  Try to reduce your stress level, such as with yoga or meditation. Talk with your health care provider if you need help. General  Instructions  Watch your dizziness for any changes.  Take medicines only as directed by your health care provider. Talk with your health care provider if you think that your dizziness is caused by a medicine that you are taking.  Tell a friend or a family member that you are feeling dizzy. If he or she notices any changes in your behavior, have this person call your health care provider.  Keep all follow-up visits as directed by your health care provider. This is important. SEEK MEDICAL CARE IF:  Your dizziness does not go away.  Your dizziness or light-headedness gets worse.  You feel nauseous.  You have reduced hearing.  You have new symptoms.  You are unsteady on your feet or you feel like the room is spinning. SEEK IMMEDIATE MEDICAL CARE IF:  You vomit or have diarrhea and are unable to eat or drink anything.  You have problems talking, walking, swallowing, or using your arms, hands, or legs.  You feel generally weak.  You are not thinking clearly or you have trouble forming sentences. It may take a friend or family member to notice this.  You have chest pain, abdominal pain, shortness of breath, or sweating.  Your vision changes.  You notice any bleeding.  You have a headache.  You have neck pain or a stiff neck.  You have a fever.   This information is not intended to replace advice given to you by your health care provider. Make sure you discuss any questions you have with your  health care provider.   Document Released: 02/07/2001 Document Revised: 12/29/2014 Document Reviewed: 08/10/2014 Elsevier Interactive Patient Education [employer].

## 2015-11-12 NOTE — ED Notes (Signed)
Pt reports hx of vertigo. States dizziness, vomiting  started Wednesday night. Alert and oriented.

## 2015-11-12 NOTE — ED Provider Notes (Signed)
CSN: [id number]     Arrival date & time 11/12/15  1134 History   First MD Initiated Contact with Patient 11/12/15 1419     Chief Complaint  Patient presents with  . Dizziness     (Consider location/radiation/quality/duration/timing/severity/associated sxs/prior Treatment) HPI 75 year old female who presents with vertigo. History of HTN, HLD, DM, COPD, and vertigo. States Wednesday has been having worsening vertigo, nausea, and vomiting with gait instability similar to prior history of vertigo. Typically taking phenergan and meclizine for symptoms, but not helping as much this time. States frequent episodes of vertigo typically that are poorly controlled. No focal weakness or numbness, double vision, speech changes, difficulty swallowing, headaches, chest pain, difficulty breathing, abdominal pain. No fever chills or recent illness. No trauma. Vertigo exacerbated by looking down and bending over.   Past Medical History  Diagnosis Date  . Gout   . Hypertension   . Asthma   . Diabetes mellitus     x 15 yrs  . Back pain   . Pancreatic cyst   . Shoulder dislocation   . COPD (chronic obstructive pulmonary disease) ([place])   . Pneumonia   . Diabetes ([place]) 09/01/2014  . Arthritis   . Vertigo   . Renal disorder    Past Surgical History  Procedure Laterality Date  . Breast surgery      benign  . Abdominal hysterectomy    . Bladder surgery      bladder tac  . Foot surgery     Family History  Problem Relation Age of Onset  . Cancer Mother   . Emphysema Father    Social History  Substance Use Topics  . Smoking status: Never Smoker   . Smokeless tobacco: Never Used  . Alcohol Use: No   OB History    Gravida Para Term Preterm AB TAB SAB Ectopic Multiple Living   2 2 2       2      Review of Systems 10/14 systems reviewed and are negative other than those stated in the HPI    Allergies  Fentanyl; Codeine; and Levaquin  Home Medications   Prior to Admission medications    Medication Sig Start Date End Date Taking? Authorizing Provider  acetaminophen (TYLENOL) 500 MG tablet Take 500 mg by mouth daily as needed (pain). For pain   Yes Historical Provider, MD  albuterol (PROVENTIL) (2.5 MG/3ML) 0.083% nebulizer solution Take 3 mLs (2.5 mg total) by nebulization every 4 (four) hours as needed for wheezing. 09/05/14  Yes Samuella Cota, MD  albuterol (VENTOLIN HFA) 108 (90 BASE) MCG/ACT inhaler Inhale 2 puffs into the lungs every 4 (four) hours as needed for wheezing or shortness of breath. For rescue 09/05/14  Yes Samuella Cota, MD  allopurinol (ZYLOPRIM) 100 MG tablet Take 200 mg by mouth every morning.     Yes Historical Provider, MD  amLODipine (NORVASC) 5 MG tablet Take 5 mg by mouth daily.   Yes Historical Provider, MD  budesonide-formoterol (SYMBICORT) 80-4.5 MCG/ACT inhaler Inhale 2 puffs into the lungs 2 (two) times daily.    Yes Historical Provider, MD  Calcium Carbonate-Vitamin D (CALCIUM 600 + D PO) Take 1 tablet by mouth daily.     Yes Historical Provider, MD  Cholecalciferol (VITAMIN D) 2000 UNITS CAPS Take 1 capsule by mouth every morning.     Yes Historical Provider, MD  glimepiride (AMARYL) 2 MG tablet Take 2-4 mg by mouth 2 (two) times daily. 2 in the morning and  1 at bedtime   Yes Historical Provider, MD  loperamide (IMODIUM) 2 MG capsule Take 2 mg by mouth every morning.     Yes Historical Provider, MD  metoprolol (LOPRESSOR) 50 MG tablet Take 50 mg by mouth daily.     Yes Historical Provider, MD  simvastatin (ZOCOR) 10 MG tablet Take 10 mg by mouth at bedtime.     Yes Historical Provider, MD  lisinopril (PRINIVIL,ZESTRIL) 5 MG tablet Take 5 mg by mouth daily.    Historical Provider, MD  meclizine (ANTIVERT) 25 MG tablet Take 1 tablet (25 mg total) by mouth 3 (three) times daily as needed for dizziness. 11/12/15   Forde Dandy, MD  promethazine (PHENERGAN) 25 MG suppository Place 1 suppository (25 mg total) rectally every 6 (six) hours as needed for  nausea or vomiting. 11/12/15   Forde Dandy, MD   BP 114/80 mmHg  Pulse 83  Temp(Src) 98.8 F (37.1 C) (Tympanic)  Resp 19  Ht 4\' 8"  (1.422 m)  Wt 151 lb (68.493 kg)  BMI 33.87 kg/m2  SpO2 93% Physical Exam Physical Exam  Nursing note and vitals reviewed. Constitutional: Well developed, well nourished, non-toxic, and in no acute distress Head: Normocephalic and atraumatic.  Mouth/Throat: Oropharynx is clear and moist.  Neck: Normal range of motion. Neck supple.  Cardiovascular: Normal rate and regular rhythm.   Pulmonary/Chest: Effort normal and breath sounds normal.  Abdominal: Soft. There is no tenderness. There is no rebound and no guarding.  Musculoskeletal: Normal range of motion.  Skin: Skin is warm and dry.  Psychiatric: Cooperative Neurological:  Alert, oriented to person, place, time, and situation. Memory grossly in tact. Fluent speech. No dysarthria or aphasia.  Cranial nerves: VF are full. Pupils are symmetric, and reactive to light. EOMI without nystagmus. No gaze deviation. Facial muscles symmetric with activation. Sensation to light touch over face in tact bilaterally. Hearing grossly in tact. Palate elevates symmetrically. Head turn and shoulder shrug are intact. Tongue midline.  Muscle bulk and tone normal. No pronator drift. Moves all extremities symmetrically. Sensation to light touch is in tact throughout in bilateral upper and lower extremities. Coordination reveals no dysmetria with finger to nose or knee heel shin bilaterally    ED Course  Procedures (including critical care time) Labs Review Labs Reviewed  COMPREHENSIVE METABOLIC PANEL - Abnormal; Notable for the following:    Glucose, Bld 157 (*)    BUN 23 (*)    Creatinine, Ser 1.53 (*)    GFR calc non Af Amer 32 (*)    GFR calc Af Amer 37 (*)    All other components within normal limits  CBC - Abnormal; Notable for the following:    WBC 14.7 (*)    All other components within normal limits   URINALYSIS, ROUTINE W REFLEX MICROSCOPIC (NOT AT [facility]) - Abnormal; Notable for the following:    Bilirubin Urine SMALL (*)    Protein, ur 30 (*)    All other components within normal limits  DIFFERENTIAL - Abnormal; Notable for the following:    Neutro Abs 12.3 (*)    Monocytes Absolute 1.1 (*)    All other components within normal limits  URINE MICROSCOPIC-ADD ON - Abnormal; Notable for the following:    Squamous Epithelial / LPF 0-5 (*)    Bacteria, UA RARE (*)    Casts HYALINE CASTS (*)    All other components within normal limits  LIPASE, BLOOD  I-STAT TROPOININ, ED    Imaging  Review No results found. I have personally reviewed and evaluated these images and lab results as part of my medical decision-making.   EKG Interpretation   Date/Time:  Friday November 12 2015 14:35:05 EDT Ventricular Rate:  69 PR Interval:  171 QRS Duration: 89 QT Interval:  434 QTC Calculation: 465 R Axis:   10 Text Interpretation:  Sinus rhythm Low voltage, extremity leads No acute  ischemic changes Confirmed by Clayden Withem MD, Hinton Dyer [id number]) on 11/12/2015 3:47:04 PM      MDM   Final diagnoses:  Vertigo    75 year old female who presents with vertigo. He is nontoxic and in no acute distress and hemodynamically stable. Has no concerning features on neurological exam to suggest central source of vertigo, and she states that this is the same as prior episodes of her peripheral vertigo. She is given IV fluids, Phenergan and meclizine. States improved symptoms, and is able to ambulate steadily with walker, which is her baseline. Basic blood work revealing no major electrolyte or metabolic derangements. EKG is nonischemic and negative troponin. Not suggestive of a atypical ACS presentation. She does have many stroke risk factors, but given that she is able to ambulate, with no concerning neurological features, and being that this is similar to prior history of peripheral vertigo, I do not feel that she requires  MRI today. I've reviewed strict return and follow-up instructions. She expressed understanding of all discharge instructions and felt comfortable with the plan of care.    Forde Dandy, MD 11/12/15 [id number]

## 2015-11-17 DIAGNOSIS — Z1389 Encounter for screening for other disorder: Secondary | ICD-10-CM | POA: Diagnosis not present

## 2015-11-17 DIAGNOSIS — Z6832 Body mass index (BMI) 32.0-32.9, adult: Secondary | ICD-10-CM | POA: Diagnosis not present

## 2015-11-17 DIAGNOSIS — E538 Deficiency of other specified B group vitamins: Secondary | ICD-10-CM | POA: Diagnosis not present

## 2015-11-17 DIAGNOSIS — M65811 Other synovitis and tenosynovitis, right shoulder: Secondary | ICD-10-CM | POA: Diagnosis not present

## 2015-12-09 ENCOUNTER — Encounter (INDEPENDENT_AMBULATORY_CARE_PROVIDER_SITE_OTHER): Payer: Self-pay | Admitting: Internal Medicine

## 2015-12-09 ENCOUNTER — Ambulatory Visit (INDEPENDENT_AMBULATORY_CARE_PROVIDER_SITE_OTHER): Payer: Medicare Other | Admitting: Internal Medicine

## 2015-12-09 VITALS — BP 140/68 | HR 60 | Temp 98.0°F | Ht <= 58 in | Wt 152.3 lb

## 2015-12-09 DIAGNOSIS — K862 Cyst of pancreas: Secondary | ICD-10-CM

## 2015-12-09 NOTE — Progress Notes (Signed)
Subjective:    Patient ID: Lindsay Cortez, female    DOB: [birth year], 75 y.o.   MRN: [id number]  HPIHere today for f/u. She was last seen by Dr Laural Golden in April of 2016 for pancreatic cystic lesion in dilated CMD with stones ans well as cholelithiasis.   She was referred to [facility].    She had endoscopic ultrasound by Dr. Jerl Santos and pancreatic cyst at the criterion for benign cyst. Bile duct was dilated and no stones are identified implying that she had passed stones spontaneously.  She is doing good. She denies any pain. Her appetite is good. She usually has a BM daily. No melena or BRRB.   10/15/2015 MR abdomen wo contrast:   IMPRESSION: 1. Stable appearance of dominant 2 cm cystic lesion in the pancreas, apparently communicating with the main pancreatic duct and remaining consistent with side-branch IPMN. Annual surveillance recommended. This recommendation follows ACR consensus guidelines: Managing Incidental Findings on Abdominal CT: White Paper of the ACR Incidental Findings Committee. J Am Coll Radiol 2010;7:754-773. 2. Other scattered benign appearing cysts in the pancreas, spleen, liver, and kidneys. 3. Layering sludge within the gallbladder probably with associated tiny layering gallstones.  04/23/2014 MRI w/wo contrast:  FINDINGS:  Liver: A tiny sub-cm cyst is seen in the lateral segment of the left  hepatic lobe, but no liver masses are identified.  Gallbladder/Biliary: Tiny layering gallstones or gallbladder sludge  noted. No evidence of cholecystitis. Mild diffuse biliary ductal  dilatation is seen with common bile duct measuring 12 mm. One or 2  small calculi are seen in the distal common bile duct measuring  approximately 5 mm.  Pancreas: A simple appearing cyst with no contrast enhancement is  seen in the pancreatic body which measures 1.3 x 2.0 cm on image 20  of series 22. This shows probable communication with the main  pancreatic duct, and is  most consistent with a side-branch  intraductal papillary mucinous neoplasm or pseudocyst. A second  similar tiny unilocular cyst measuring 6 mm is seen in the  pancreatic uncinate process on image 24 of series 22. No evidence of  main pancreatic duct and gallbladder dilatation. No solid pancreatic  mass identified.  Spleen: Within normal limits in size. Small benign appearing cystic  lesions are noted in the spleen, without significant change compared  to previous noncontrast CT on 08/28/2011.        Review of Systems Past Medical History  Diagnosis Date  . Gout   . Hypertension   . Asthma   . Diabetes mellitus     x 15 yrs  . Back pain   . Pancreatic cyst   . Shoulder dislocation   . COPD (chronic obstructive pulmonary disease) ([place])   . Pneumonia   . Diabetes ([place]) 09/01/2014  . Arthritis   . Vertigo   . Renal disorder     Past Surgical History  Procedure Laterality Date  . Breast surgery      benign  . Abdominal hysterectomy    . Bladder surgery      bladder tac  . Foot surgery      Allergies  Allergen Reactions  . Fentanyl Nausea And Vomiting  . Codeine     Narcotic pain medicine makes her nauseated.  Says was told to only take tylenol due to kidney disease.  [place] [Levofloxacin In D5w] Nausea And Vomiting    Current Outpatient Prescriptions on File Prior to Visit  Medication Sig Dispense Refill  .  acetaminophen (TYLENOL) 500 MG tablet Take 500 mg by mouth daily as needed (pain). For pain    . albuterol (PROVENTIL) (2.5 MG/3ML) 0.083% nebulizer solution Take 3 mLs (2.5 mg total) by nebulization every 4 (four) hours as needed for wheezing. 75 mL 1  . albuterol (VENTOLIN HFA) 108 (90 BASE) MCG/ACT inhaler Inhale 2 puffs into the lungs every 4 (four) hours as needed for wheezing or shortness of breath. For rescue 1 Inhaler 1  . allopurinol (ZYLOPRIM) 100 MG tablet Take 200 mg by mouth every morning.      Marland Kitchen amLODipine (NORVASC) 5 MG tablet Take 5  mg by mouth daily.    . budesonide-formoterol (SYMBICORT) 80-4.5 MCG/ACT inhaler Inhale 2 puffs into the lungs 2 (two) times daily.     . Calcium Carbonate-Vitamin D (CALCIUM 600 + D PO) Take 1 tablet by mouth daily.      . Cholecalciferol (VITAMIN D) 2000 UNITS CAPS Take 1 capsule by mouth every morning.      Marland Kitchen glimepiride (AMARYL) 2 MG tablet Take 2-4 mg by mouth 2 (two) times daily. 2 in the morning and 1 at bedtime    . loperamide (IMODIUM) 2 MG capsule Take 2 mg by mouth every morning.      . meclizine (ANTIVERT) 25 MG tablet Take 1 tablet (25 mg total) by mouth 3 (three) times daily as needed for dizziness. 30 tablet 0  . metoprolol (LOPRESSOR) 50 MG tablet Take 50 mg by mouth daily.      . promethazine (PHENERGAN) 25 MG suppository Place 1 suppository (25 mg total) rectally every 6 (six) hours as needed for nausea or vomiting. 12 each 0  . simvastatin (ZOCOR) 10 MG tablet Take 10 mg by mouth at bedtime.       No current facility-administered medications on file prior to visit.           Objective:   Physical ExamBlood pressure 140/68, pulse 60, temperature 98 F (36.7 C), height 4\' 10"  (1.473 m), weight 152 lb 4.8 oz (69.083 kg). Alert and oriented. Skin warm and dry. Oral mucosa is moist.   . Sclera anicteric, conjunctivae is pink. Thyroid not enlarged. No cervical lymphadenopathy. Lungs clear. Heart regular rate and rhythm.  Abdomen is soft. Bowel sounds are positive. No hepatomegaly. No abdominal masses felt. No tenderness.  No edema to lower extremities.          Assessment & Plan:      1. Pancreatic cyst. By EUS criteria and this is a benign cyst and on follow-up MRI of this cyst has not increased in size.  MRI in February revealed   1. Stable appearance of dominant 2 cm cystic lesion in the pancreas, apparently communicating with the main pancreatic duct and remaining consistent with side-branch IPMN. Annual surveillance recommended.  2. Dilated bile duct. She has  history of choledocholithiasis which she passed spontaneously and did not require ERCP at the time of EUS.   3. Asymptomatic cholelithiasis. #4. History of weight loss. She has maintained her weight.

## 2015-12-09 NOTE — Patient Instructions (Signed)
OV in 1 year. MRI abdomen wo in 1 year.

## 2015-12-16 DIAGNOSIS — J441 Chronic obstructive pulmonary disease with (acute) exacerbation: Secondary | ICD-10-CM | POA: Diagnosis not present

## 2015-12-16 DIAGNOSIS — J069 Acute upper respiratory infection, unspecified: Secondary | ICD-10-CM | POA: Diagnosis not present

## 2015-12-17 DIAGNOSIS — D510 Vitamin B12 deficiency anemia due to intrinsic factor deficiency: Secondary | ICD-10-CM | POA: Diagnosis not present

## 2016-01-18 DIAGNOSIS — E6609 Other obesity due to excess calories: Secondary | ICD-10-CM | POA: Diagnosis not present

## 2016-01-18 DIAGNOSIS — M65812 Other synovitis and tenosynovitis, left shoulder: Secondary | ICD-10-CM | POA: Diagnosis not present

## 2016-01-18 DIAGNOSIS — Z6832 Body mass index (BMI) 32.0-32.9, adult: Secondary | ICD-10-CM | POA: Diagnosis not present

## 2016-01-20 DIAGNOSIS — E0590 Thyrotoxicosis, unspecified without thyrotoxic crisis or storm: Secondary | ICD-10-CM | POA: Diagnosis not present

## 2016-01-26 DIAGNOSIS — Z808 Family history of malignant neoplasm of other organs or systems: Secondary | ICD-10-CM | POA: Diagnosis not present

## 2016-01-26 DIAGNOSIS — E0590 Thyrotoxicosis, unspecified without thyrotoxic crisis or storm: Secondary | ICD-10-CM | POA: Diagnosis not present

## 2016-02-09 DIAGNOSIS — N183 Chronic kidney disease, stage 3 (moderate): Secondary | ICD-10-CM | POA: Diagnosis not present

## 2016-02-09 DIAGNOSIS — D509 Iron deficiency anemia, unspecified: Secondary | ICD-10-CM | POA: Diagnosis not present

## 2016-02-09 DIAGNOSIS — R809 Proteinuria, unspecified: Secondary | ICD-10-CM | POA: Diagnosis not present

## 2016-02-09 DIAGNOSIS — Z79899 Other long term (current) drug therapy: Secondary | ICD-10-CM | POA: Diagnosis not present

## 2016-02-09 DIAGNOSIS — E559 Vitamin D deficiency, unspecified: Secondary | ICD-10-CM | POA: Diagnosis not present

## 2016-02-09 DIAGNOSIS — I10 Essential (primary) hypertension: Secondary | ICD-10-CM | POA: Diagnosis not present

## 2016-02-15 DIAGNOSIS — E1140 Type 2 diabetes mellitus with diabetic neuropathy, unspecified: Secondary | ICD-10-CM | POA: Diagnosis not present

## 2016-02-15 DIAGNOSIS — E538 Deficiency of other specified B group vitamins: Secondary | ICD-10-CM | POA: Diagnosis not present

## 2016-02-15 DIAGNOSIS — M1991 Primary osteoarthritis, unspecified site: Secondary | ICD-10-CM | POA: Diagnosis not present

## 2016-02-15 DIAGNOSIS — Z6831 Body mass index (BMI) 31.0-31.9, adult: Secondary | ICD-10-CM | POA: Diagnosis not present

## 2016-02-15 DIAGNOSIS — Z1389 Encounter for screening for other disorder: Secondary | ICD-10-CM | POA: Diagnosis not present

## 2016-02-18 DIAGNOSIS — R809 Proteinuria, unspecified: Secondary | ICD-10-CM | POA: Diagnosis not present

## 2016-02-18 DIAGNOSIS — N183 Chronic kidney disease, stage 3 (moderate): Secondary | ICD-10-CM | POA: Diagnosis not present

## 2016-02-18 DIAGNOSIS — I10 Essential (primary) hypertension: Secondary | ICD-10-CM | POA: Diagnosis not present

## 2016-03-07 DIAGNOSIS — Z6831 Body mass index (BMI) 31.0-31.9, adult: Secondary | ICD-10-CM | POA: Diagnosis not present

## 2016-03-07 DIAGNOSIS — Z0000 Encounter for general adult medical examination without abnormal findings: Secondary | ICD-10-CM | POA: Diagnosis not present

## 2016-03-07 DIAGNOSIS — Z23 Encounter for immunization: Secondary | ICD-10-CM | POA: Diagnosis not present

## 2016-03-07 DIAGNOSIS — Z1389 Encounter for screening for other disorder: Secondary | ICD-10-CM | POA: Diagnosis not present

## 2016-03-10 DIAGNOSIS — Z79899 Other long term (current) drug therapy: Secondary | ICD-10-CM | POA: Diagnosis not present

## 2016-03-10 DIAGNOSIS — N183 Chronic kidney disease, stage 3 (moderate): Secondary | ICD-10-CM | POA: Diagnosis not present

## 2016-03-10 DIAGNOSIS — I10 Essential (primary) hypertension: Secondary | ICD-10-CM | POA: Diagnosis not present

## 2016-03-28 DIAGNOSIS — H6500 Acute serous otitis media, unspecified ear: Secondary | ICD-10-CM | POA: Diagnosis not present

## 2016-03-28 DIAGNOSIS — Z6831 Body mass index (BMI) 31.0-31.9, adult: Secondary | ICD-10-CM | POA: Diagnosis not present

## 2016-03-28 DIAGNOSIS — E538 Deficiency of other specified B group vitamins: Secondary | ICD-10-CM | POA: Diagnosis not present

## 2016-03-28 DIAGNOSIS — E118 Type 2 diabetes mellitus with unspecified complications: Secondary | ICD-10-CM | POA: Diagnosis not present

## 2016-03-29 DIAGNOSIS — E0590 Thyrotoxicosis, unspecified without thyrotoxic crisis or storm: Secondary | ICD-10-CM | POA: Diagnosis not present

## 2016-03-30 DIAGNOSIS — E1165 Type 2 diabetes mellitus with hyperglycemia: Secondary | ICD-10-CM | POA: Diagnosis not present

## 2016-03-30 DIAGNOSIS — E890 Postprocedural hypothyroidism: Secondary | ICD-10-CM | POA: Diagnosis not present

## 2016-03-30 DIAGNOSIS — I10 Essential (primary) hypertension: Secondary | ICD-10-CM | POA: Diagnosis not present

## 2016-03-30 DIAGNOSIS — E0590 Thyrotoxicosis, unspecified without thyrotoxic crisis or storm: Secondary | ICD-10-CM | POA: Diagnosis not present

## 2016-04-17 ENCOUNTER — Encounter (INDEPENDENT_AMBULATORY_CARE_PROVIDER_SITE_OTHER): Payer: Self-pay | Admitting: *Deleted

## 2016-05-04 DIAGNOSIS — E538 Deficiency of other specified B group vitamins: Secondary | ICD-10-CM | POA: Diagnosis not present

## 2016-05-04 DIAGNOSIS — Z23 Encounter for immunization: Secondary | ICD-10-CM | POA: Diagnosis not present

## 2016-05-17 DIAGNOSIS — I10 Essential (primary) hypertension: Secondary | ICD-10-CM | POA: Diagnosis not present

## 2016-05-17 DIAGNOSIS — E875 Hyperkalemia: Secondary | ICD-10-CM | POA: Diagnosis not present

## 2016-05-17 DIAGNOSIS — E1129 Type 2 diabetes mellitus with other diabetic kidney complication: Secondary | ICD-10-CM | POA: Diagnosis not present

## 2016-05-17 DIAGNOSIS — N183 Chronic kidney disease, stage 3 (moderate): Secondary | ICD-10-CM | POA: Diagnosis not present

## 2016-05-17 DIAGNOSIS — R809 Proteinuria, unspecified: Secondary | ICD-10-CM | POA: Diagnosis not present

## 2016-05-17 DIAGNOSIS — Z79899 Other long term (current) drug therapy: Secondary | ICD-10-CM | POA: Diagnosis not present

## 2016-05-23 DIAGNOSIS — R42 Dizziness and giddiness: Secondary | ICD-10-CM | POA: Diagnosis not present

## 2016-05-23 DIAGNOSIS — H6983 Other specified disorders of Eustachian tube, bilateral: Secondary | ICD-10-CM | POA: Diagnosis not present

## 2016-05-23 DIAGNOSIS — H9011 Conductive hearing loss, unilateral, right ear, with unrestricted hearing on the contralateral side: Secondary | ICD-10-CM | POA: Diagnosis not present

## 2016-05-23 DIAGNOSIS — H7201 Central perforation of tympanic membrane, right ear: Secondary | ICD-10-CM | POA: Diagnosis not present

## 2016-05-24 DIAGNOSIS — R809 Proteinuria, unspecified: Secondary | ICD-10-CM | POA: Diagnosis not present

## 2016-05-24 DIAGNOSIS — I10 Essential (primary) hypertension: Secondary | ICD-10-CM | POA: Diagnosis not present

## 2016-05-24 DIAGNOSIS — N183 Chronic kidney disease, stage 3 (moderate): Secondary | ICD-10-CM | POA: Diagnosis not present

## 2016-05-30 DIAGNOSIS — E890 Postprocedural hypothyroidism: Secondary | ICD-10-CM | POA: Diagnosis not present

## 2016-05-31 DIAGNOSIS — D510 Vitamin B12 deficiency anemia due to intrinsic factor deficiency: Secondary | ICD-10-CM | POA: Diagnosis not present

## 2016-06-01 IMAGING — NM NM THYROID IMAGING W/ UPTAKE SINGLE (24 HR)
4 series · 4 of 4 positions shown · non-contrast
Comparison: None

CLINICAL DATA: Weight loss and eating less.

EXAM:
THYROID SCAN AND UPTAKE - 24 HOURS
TECHNIQUE: Following the per oral administration of U-5H5 sodium iodide, the
patient returned at 24 hours and uptake measurements were acquired
with the uptake probe centered on the neck. Thyroid imaging was
performed following the intravenous administration of the 4c-JJm
Pertechnetate.
RADIOPHARMACEUTICALS:  8.0 microCuries U-5H5 sodium iodide orally
and 10.0 mCi Eechnetium-44m pertechnetate IV

[Series 1: ant w marker · 3.25mm/px · 1 of 1 slices shown]
[im 1/1]
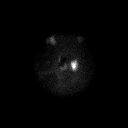

[Series 2: anterior · 3.25mm/px · 1 of 1 slices shown]
[im 1/1]
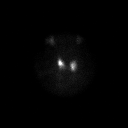

[Series 3: lao · 3.25mm/px · 1 of 1 slices shown]
[im 1/1]
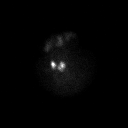

[Series 4: rao · 3.25mm/px · 1 of 1 slices shown]
[im 1/1]
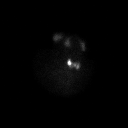

[4 of 4 positions shown; findings below may reference images not displayed]

FINDINGS: B 24 hour radioactive iodine uptake is equal to 10.2%.

Heterogeneous uptake is noted in both lobes of the thyroid gland.
There is a hot nodule localizing to the upper pole of the right
lobe.
IMPRESSION: 1. 24 hour radioactive iodine uptake is lower limits of normal at
10%.
2. Thyroid scan compatible with multi nodular thyroid

## 2016-06-26 ENCOUNTER — Ambulatory Visit (INDEPENDENT_AMBULATORY_CARE_PROVIDER_SITE_OTHER): Payer: Medicare Other | Admitting: Otolaryngology

## 2016-06-26 DIAGNOSIS — H6983 Other specified disorders of Eustachian tube, bilateral: Secondary | ICD-10-CM | POA: Diagnosis not present

## 2016-07-05 DIAGNOSIS — E538 Deficiency of other specified B group vitamins: Secondary | ICD-10-CM | POA: Diagnosis not present

## 2016-07-18 DIAGNOSIS — E0590 Thyrotoxicosis, unspecified without thyrotoxic crisis or storm: Secondary | ICD-10-CM | POA: Diagnosis not present

## 2016-07-18 DIAGNOSIS — Z808 Family history of malignant neoplasm of other organs or systems: Secondary | ICD-10-CM | POA: Diagnosis not present

## 2016-07-27 DIAGNOSIS — E1165 Type 2 diabetes mellitus with hyperglycemia: Secondary | ICD-10-CM | POA: Diagnosis not present

## 2016-07-27 DIAGNOSIS — E890 Postprocedural hypothyroidism: Secondary | ICD-10-CM | POA: Diagnosis not present

## 2016-07-27 DIAGNOSIS — N183 Chronic kidney disease, stage 3 (moderate): Secondary | ICD-10-CM | POA: Diagnosis not present

## 2016-07-27 DIAGNOSIS — Z808 Family history of malignant neoplasm of other organs or systems: Secondary | ICD-10-CM | POA: Diagnosis not present

## 2016-08-01 DIAGNOSIS — D510 Vitamin B12 deficiency anemia due to intrinsic factor deficiency: Secondary | ICD-10-CM | POA: Diagnosis not present

## 2016-08-07 DIAGNOSIS — J329 Chronic sinusitis, unspecified: Secondary | ICD-10-CM | POA: Diagnosis not present

## 2016-08-07 DIAGNOSIS — J111 Influenza due to unidentified influenza virus with other respiratory manifestations: Secondary | ICD-10-CM | POA: Diagnosis not present

## 2016-08-24 DIAGNOSIS — D509 Iron deficiency anemia, unspecified: Secondary | ICD-10-CM | POA: Diagnosis not present

## 2016-08-24 DIAGNOSIS — R809 Proteinuria, unspecified: Secondary | ICD-10-CM | POA: Diagnosis not present

## 2016-08-24 DIAGNOSIS — N183 Chronic kidney disease, stage 3 (moderate): Secondary | ICD-10-CM | POA: Diagnosis not present

## 2016-08-24 DIAGNOSIS — I10 Essential (primary) hypertension: Secondary | ICD-10-CM | POA: Diagnosis not present

## 2016-08-24 DIAGNOSIS — Z79899 Other long term (current) drug therapy: Secondary | ICD-10-CM | POA: Diagnosis not present

## 2016-08-24 DIAGNOSIS — E559 Vitamin D deficiency, unspecified: Secondary | ICD-10-CM | POA: Diagnosis not present

## 2016-08-30 DIAGNOSIS — E669 Obesity, unspecified: Secondary | ICD-10-CM | POA: Diagnosis not present

## 2016-08-30 DIAGNOSIS — N183 Chronic kidney disease, stage 3 (moderate): Secondary | ICD-10-CM | POA: Diagnosis not present

## 2016-08-30 DIAGNOSIS — R809 Proteinuria, unspecified: Secondary | ICD-10-CM | POA: Diagnosis not present

## 2016-08-30 DIAGNOSIS — I10 Essential (primary) hypertension: Secondary | ICD-10-CM | POA: Diagnosis not present

## 2016-09-08 DIAGNOSIS — D510 Vitamin B12 deficiency anemia due to intrinsic factor deficiency: Secondary | ICD-10-CM | POA: Diagnosis not present

## 2016-09-18 DIAGNOSIS — J441 Chronic obstructive pulmonary disease with (acute) exacerbation: Secondary | ICD-10-CM | POA: Diagnosis not present

## 2016-10-09 DIAGNOSIS — Z6830 Body mass index (BMI) 30.0-30.9, adult: Secondary | ICD-10-CM | POA: Diagnosis not present

## 2016-10-09 DIAGNOSIS — I10 Essential (primary) hypertension: Secondary | ICD-10-CM | POA: Diagnosis not present

## 2016-10-09 DIAGNOSIS — Z1389 Encounter for screening for other disorder: Secondary | ICD-10-CM | POA: Diagnosis not present

## 2016-10-09 DIAGNOSIS — E1140 Type 2 diabetes mellitus with diabetic neuropathy, unspecified: Secondary | ICD-10-CM | POA: Diagnosis not present

## 2016-10-09 DIAGNOSIS — E119 Type 2 diabetes mellitus without complications: Secondary | ICD-10-CM | POA: Diagnosis not present

## 2016-10-09 DIAGNOSIS — M1991 Primary osteoarthritis, unspecified site: Secondary | ICD-10-CM | POA: Diagnosis not present

## 2016-10-09 DIAGNOSIS — J449 Chronic obstructive pulmonary disease, unspecified: Secondary | ICD-10-CM | POA: Diagnosis not present

## 2016-10-09 DIAGNOSIS — E538 Deficiency of other specified B group vitamins: Secondary | ICD-10-CM | POA: Diagnosis not present

## 2016-10-10 ENCOUNTER — Other Ambulatory Visit (HOSPITAL_COMMUNITY): Payer: Self-pay | Admitting: Internal Medicine

## 2016-10-10 DIAGNOSIS — E2839 Other primary ovarian failure: Secondary | ICD-10-CM

## 2016-10-15 DIAGNOSIS — Z1211 Encounter for screening for malignant neoplasm of colon: Secondary | ICD-10-CM | POA: Diagnosis not present

## 2016-10-18 ENCOUNTER — Inpatient Hospital Stay (HOSPITAL_COMMUNITY)
Admission: RE | Admit: 2016-10-18 | Discharge: 2016-10-18 | Disposition: A | Discharge status: Home / Self Care | Payer: Medicare Other | Source: Ambulatory Visit | Attending: Internal Medicine | Admitting: Internal Medicine

## 2016-10-18 ENCOUNTER — Encounter (HOSPITAL_COMMUNITY): Payer: Self-pay

## 2016-10-20 DIAGNOSIS — I10 Essential (primary) hypertension: Secondary | ICD-10-CM | POA: Diagnosis not present

## 2016-10-20 DIAGNOSIS — N183 Chronic kidney disease, stage 3 (moderate): Secondary | ICD-10-CM | POA: Diagnosis not present

## 2016-10-20 DIAGNOSIS — Z808 Family history of malignant neoplasm of other organs or systems: Secondary | ICD-10-CM | POA: Diagnosis not present

## 2016-10-20 DIAGNOSIS — E1165 Type 2 diabetes mellitus with hyperglycemia: Secondary | ICD-10-CM | POA: Diagnosis not present

## 2016-10-20 DIAGNOSIS — E890 Postprocedural hypothyroidism: Secondary | ICD-10-CM | POA: Diagnosis not present

## 2016-10-25 DIAGNOSIS — N183 Chronic kidney disease, stage 3 (moderate): Secondary | ICD-10-CM | POA: Diagnosis not present

## 2016-10-25 DIAGNOSIS — E890 Postprocedural hypothyroidism: Secondary | ICD-10-CM | POA: Diagnosis not present

## 2016-10-25 DIAGNOSIS — E1165 Type 2 diabetes mellitus with hyperglycemia: Secondary | ICD-10-CM | POA: Diagnosis not present

## 2016-10-25 DIAGNOSIS — I10 Essential (primary) hypertension: Secondary | ICD-10-CM | POA: Diagnosis not present

## 2016-11-01 DIAGNOSIS — E538 Deficiency of other specified B group vitamins: Secondary | ICD-10-CM | POA: Diagnosis not present

## 2016-11-08 ENCOUNTER — Encounter (INDEPENDENT_AMBULATORY_CARE_PROVIDER_SITE_OTHER): Payer: Self-pay | Admitting: Internal Medicine

## 2016-11-10 ENCOUNTER — Ambulatory Visit (HOSPITAL_COMMUNITY)
Admission: RE | Admit: 2016-11-10 | Discharge: 2016-11-10 | Disposition: A | Discharge status: Home / Self Care | Payer: Medicare Other | Source: Ambulatory Visit | Attending: Internal Medicine | Admitting: Internal Medicine

## 2016-11-10 DIAGNOSIS — E2839 Other primary ovarian failure: Secondary | ICD-10-CM | POA: Insufficient documentation

## 2016-11-10 DIAGNOSIS — M810 Age-related osteoporosis without current pathological fracture: Secondary | ICD-10-CM | POA: Diagnosis not present

## 2016-11-10 DIAGNOSIS — M818 Other osteoporosis without current pathological fracture: Secondary | ICD-10-CM | POA: Diagnosis not present

## 2016-11-21 DIAGNOSIS — J329 Chronic sinusitis, unspecified: Secondary | ICD-10-CM | POA: Diagnosis not present

## 2016-11-21 DIAGNOSIS — J069 Acute upper respiratory infection, unspecified: Secondary | ICD-10-CM | POA: Diagnosis not present

## 2016-11-23 DIAGNOSIS — J329 Chronic sinusitis, unspecified: Secondary | ICD-10-CM | POA: Diagnosis not present

## 2016-11-23 DIAGNOSIS — J441 Chronic obstructive pulmonary disease with (acute) exacerbation: Secondary | ICD-10-CM | POA: Diagnosis not present

## 2016-11-28 ENCOUNTER — Encounter (INDEPENDENT_AMBULATORY_CARE_PROVIDER_SITE_OTHER): Payer: Self-pay | Admitting: *Deleted

## 2016-11-29 ENCOUNTER — Telehealth (INDEPENDENT_AMBULATORY_CARE_PROVIDER_SITE_OTHER): Payer: Self-pay | Admitting: Internal Medicine

## 2016-11-29 ENCOUNTER — Other Ambulatory Visit (INDEPENDENT_AMBULATORY_CARE_PROVIDER_SITE_OTHER): Payer: Self-pay | Admitting: Internal Medicine

## 2016-11-29 ENCOUNTER — Telehealth (INDEPENDENT_AMBULATORY_CARE_PROVIDER_SITE_OTHER): Payer: Self-pay | Admitting: *Deleted

## 2016-11-29 DIAGNOSIS — R809 Proteinuria, unspecified: Secondary | ICD-10-CM | POA: Diagnosis not present

## 2016-11-29 DIAGNOSIS — K862 Cyst of pancreas: Secondary | ICD-10-CM

## 2016-11-29 DIAGNOSIS — D649 Anemia, unspecified: Secondary | ICD-10-CM | POA: Diagnosis not present

## 2016-11-29 DIAGNOSIS — Z79899 Other long term (current) drug therapy: Secondary | ICD-10-CM | POA: Diagnosis not present

## 2016-11-29 DIAGNOSIS — E559 Vitamin D deficiency, unspecified: Secondary | ICD-10-CM | POA: Diagnosis not present

## 2016-11-29 DIAGNOSIS — N183 Chronic kidney disease, stage 3 (moderate): Secondary | ICD-10-CM | POA: Diagnosis not present

## 2016-11-29 DIAGNOSIS — I10 Essential (primary) hypertension: Secondary | ICD-10-CM | POA: Diagnosis not present

## 2016-11-29 NOTE — Telephone Encounter (Signed)
Patient is on recall for 1 yr MRI abd wo (pancreatic cyst) -- please do order for [facility] [place]

## 2016-11-29 NOTE — Addendum Note (Signed)
Addended by: Butch Penny on: 11/29/2016 12:00 PM   Modules accepted: Orders

## 2016-11-29 NOTE — Telephone Encounter (Signed)
MRI ordered

## 2016-11-29 NOTE — Telephone Encounter (Signed)
[facility] will contact patient directly to schedule MRI

## 2016-12-06 DIAGNOSIS — N183 Chronic kidney disease, stage 3 (moderate): Secondary | ICD-10-CM | POA: Diagnosis not present

## 2016-12-06 DIAGNOSIS — E669 Obesity, unspecified: Secondary | ICD-10-CM | POA: Diagnosis not present

## 2016-12-06 DIAGNOSIS — R809 Proteinuria, unspecified: Secondary | ICD-10-CM | POA: Diagnosis not present

## 2016-12-11 ENCOUNTER — Ambulatory Visit (INDEPENDENT_AMBULATORY_CARE_PROVIDER_SITE_OTHER): Payer: Medicare Other | Admitting: Internal Medicine

## 2016-12-15 ENCOUNTER — Ambulatory Visit
Admission: RE | Admit: 2016-12-15 | Discharge: 2016-12-15 | Disposition: A | Discharge status: Home / Self Care | Payer: Medicare Other | Source: Ambulatory Visit | Attending: Internal Medicine | Admitting: Internal Medicine

## 2016-12-15 DIAGNOSIS — N200 Calculus of kidney: Secondary | ICD-10-CM | POA: Diagnosis not present

## 2016-12-15 DIAGNOSIS — K862 Cyst of pancreas: Secondary | ICD-10-CM | POA: Diagnosis not present

## 2016-12-18 ENCOUNTER — Telehealth (INDEPENDENT_AMBULATORY_CARE_PROVIDER_SITE_OTHER): Payer: Self-pay | Admitting: Internal Medicine

## 2016-12-18 NOTE — Telephone Encounter (Signed)
Lindsay Cortez, MIR abdomen without in one year.

## 2016-12-19 NOTE — Telephone Encounter (Signed)
1 yr MRI noted in recall

## 2016-12-25 ENCOUNTER — Ambulatory Visit (INDEPENDENT_AMBULATORY_CARE_PROVIDER_SITE_OTHER): Payer: Medicare Other | Admitting: Otolaryngology

## 2016-12-25 DIAGNOSIS — H903 Sensorineural hearing loss, bilateral: Secondary | ICD-10-CM

## 2016-12-25 DIAGNOSIS — H7201 Central perforation of tympanic membrane, right ear: Secondary | ICD-10-CM | POA: Diagnosis not present

## 2016-12-27 ENCOUNTER — Ambulatory Visit (INDEPENDENT_AMBULATORY_CARE_PROVIDER_SITE_OTHER): Payer: Medicare Other | Admitting: Internal Medicine

## 2016-12-29 DIAGNOSIS — E119 Type 2 diabetes mellitus without complications: Secondary | ICD-10-CM | POA: Diagnosis not present

## 2016-12-29 DIAGNOSIS — H2513 Age-related nuclear cataract, bilateral: Secondary | ICD-10-CM | POA: Diagnosis not present

## 2016-12-29 DIAGNOSIS — E118 Type 2 diabetes mellitus with unspecified complications: Secondary | ICD-10-CM | POA: Diagnosis not present

## 2017-01-01 DIAGNOSIS — E538 Deficiency of other specified B group vitamins: Secondary | ICD-10-CM | POA: Diagnosis not present

## 2017-02-01 DIAGNOSIS — E538 Deficiency of other specified B group vitamins: Secondary | ICD-10-CM | POA: Diagnosis not present

## 2017-03-06 DIAGNOSIS — E538 Deficiency of other specified B group vitamins: Secondary | ICD-10-CM | POA: Diagnosis not present

## 2017-03-20 DIAGNOSIS — R809 Proteinuria, unspecified: Secondary | ICD-10-CM | POA: Diagnosis not present

## 2017-03-20 DIAGNOSIS — N183 Chronic kidney disease, stage 3 (moderate): Secondary | ICD-10-CM | POA: Diagnosis not present

## 2017-03-20 DIAGNOSIS — I10 Essential (primary) hypertension: Secondary | ICD-10-CM | POA: Diagnosis not present

## 2017-03-20 DIAGNOSIS — E559 Vitamin D deficiency, unspecified: Secondary | ICD-10-CM | POA: Diagnosis not present

## 2017-03-20 DIAGNOSIS — Z79899 Other long term (current) drug therapy: Secondary | ICD-10-CM | POA: Diagnosis not present

## 2017-03-20 DIAGNOSIS — D509 Iron deficiency anemia, unspecified: Secondary | ICD-10-CM | POA: Diagnosis not present

## 2017-03-28 DIAGNOSIS — N184 Chronic kidney disease, stage 4 (severe): Secondary | ICD-10-CM | POA: Diagnosis not present

## 2017-03-28 DIAGNOSIS — R809 Proteinuria, unspecified: Secondary | ICD-10-CM | POA: Diagnosis not present

## 2017-03-28 DIAGNOSIS — E875 Hyperkalemia: Secondary | ICD-10-CM | POA: Diagnosis not present

## 2017-03-30 DIAGNOSIS — Z0000 Encounter for general adult medical examination without abnormal findings: Secondary | ICD-10-CM | POA: Diagnosis not present

## 2017-03-30 DIAGNOSIS — Z6828 Body mass index (BMI) 28.0-28.9, adult: Secondary | ICD-10-CM | POA: Diagnosis not present

## 2017-03-30 DIAGNOSIS — E663 Overweight: Secondary | ICD-10-CM | POA: Diagnosis not present

## 2017-04-11 DIAGNOSIS — I10 Essential (primary) hypertension: Secondary | ICD-10-CM | POA: Diagnosis not present

## 2017-04-11 DIAGNOSIS — E538 Deficiency of other specified B group vitamins: Secondary | ICD-10-CM | POA: Diagnosis not present

## 2017-04-11 DIAGNOSIS — N183 Chronic kidney disease, stage 3 (moderate): Secondary | ICD-10-CM | POA: Diagnosis not present

## 2017-04-11 DIAGNOSIS — Z79899 Other long term (current) drug therapy: Secondary | ICD-10-CM | POA: Diagnosis not present

## 2017-04-16 DIAGNOSIS — E1165 Type 2 diabetes mellitus with hyperglycemia: Secondary | ICD-10-CM | POA: Diagnosis not present

## 2017-04-16 DIAGNOSIS — E890 Postprocedural hypothyroidism: Secondary | ICD-10-CM | POA: Diagnosis not present

## 2017-04-24 DIAGNOSIS — N183 Chronic kidney disease, stage 3 (moderate): Secondary | ICD-10-CM | POA: Diagnosis not present

## 2017-04-24 DIAGNOSIS — Z808 Family history of malignant neoplasm of other organs or systems: Secondary | ICD-10-CM | POA: Diagnosis not present

## 2017-04-24 DIAGNOSIS — E1165 Type 2 diabetes mellitus with hyperglycemia: Secondary | ICD-10-CM | POA: Diagnosis not present

## 2017-04-24 DIAGNOSIS — E890 Postprocedural hypothyroidism: Secondary | ICD-10-CM | POA: Diagnosis not present

## 2017-05-07 DIAGNOSIS — E538 Deficiency of other specified B group vitamins: Secondary | ICD-10-CM | POA: Diagnosis not present

## 2017-05-08 DIAGNOSIS — M5136 Other intervertebral disc degeneration, lumbar region: Secondary | ICD-10-CM | POA: Diagnosis not present

## 2017-05-08 DIAGNOSIS — M47816 Spondylosis without myelopathy or radiculopathy, lumbar region: Secondary | ICD-10-CM | POA: Diagnosis not present

## 2017-05-15 DIAGNOSIS — M47816 Spondylosis without myelopathy or radiculopathy, lumbar region: Secondary | ICD-10-CM | POA: Diagnosis not present

## 2017-06-04 DIAGNOSIS — E538 Deficiency of other specified B group vitamins: Secondary | ICD-10-CM | POA: Diagnosis not present

## 2017-06-04 DIAGNOSIS — Z23 Encounter for immunization: Secondary | ICD-10-CM | POA: Diagnosis not present

## 2017-06-25 DIAGNOSIS — E890 Postprocedural hypothyroidism: Secondary | ICD-10-CM | POA: Diagnosis not present

## 2017-07-09 DIAGNOSIS — E538 Deficiency of other specified B group vitamins: Secondary | ICD-10-CM | POA: Diagnosis not present

## 2017-08-11 DIAGNOSIS — J441 Chronic obstructive pulmonary disease with (acute) exacerbation: Secondary | ICD-10-CM | POA: Diagnosis not present

## 2017-08-11 DIAGNOSIS — J209 Acute bronchitis, unspecified: Secondary | ICD-10-CM | POA: Diagnosis not present

## 2017-08-11 DIAGNOSIS — J302 Other seasonal allergic rhinitis: Secondary | ICD-10-CM | POA: Diagnosis not present

## 2017-09-03 DIAGNOSIS — D510 Vitamin B12 deficiency anemia due to intrinsic factor deficiency: Secondary | ICD-10-CM | POA: Diagnosis not present

## 2017-10-25 DIAGNOSIS — E538 Deficiency of other specified B group vitamins: Secondary | ICD-10-CM | POA: Diagnosis not present

## 2017-10-25 DIAGNOSIS — E1165 Type 2 diabetes mellitus with hyperglycemia: Secondary | ICD-10-CM | POA: Diagnosis not present

## 2017-10-25 DIAGNOSIS — E890 Postprocedural hypothyroidism: Secondary | ICD-10-CM | POA: Diagnosis not present

## 2017-11-01 DIAGNOSIS — I10 Essential (primary) hypertension: Secondary | ICD-10-CM | POA: Diagnosis not present

## 2017-11-01 DIAGNOSIS — N183 Chronic kidney disease, stage 3 (moderate): Secondary | ICD-10-CM | POA: Diagnosis not present

## 2017-11-01 DIAGNOSIS — E890 Postprocedural hypothyroidism: Secondary | ICD-10-CM | POA: Diagnosis not present

## 2017-11-01 DIAGNOSIS — E1165 Type 2 diabetes mellitus with hyperglycemia: Secondary | ICD-10-CM | POA: Diagnosis not present

## 2017-11-01 DIAGNOSIS — Z808 Family history of malignant neoplasm of other organs or systems: Secondary | ICD-10-CM | POA: Diagnosis not present

## 2017-11-09 DIAGNOSIS — E1140 Type 2 diabetes mellitus with diabetic neuropathy, unspecified: Secondary | ICD-10-CM | POA: Diagnosis not present

## 2017-11-09 DIAGNOSIS — J449 Chronic obstructive pulmonary disease, unspecified: Secondary | ICD-10-CM | POA: Diagnosis not present

## 2017-11-09 DIAGNOSIS — J45909 Unspecified asthma, uncomplicated: Secondary | ICD-10-CM | POA: Diagnosis not present

## 2017-11-09 DIAGNOSIS — E782 Mixed hyperlipidemia: Secondary | ICD-10-CM | POA: Diagnosis not present

## 2017-11-09 DIAGNOSIS — I10 Essential (primary) hypertension: Secondary | ICD-10-CM | POA: Diagnosis not present

## 2017-11-09 DIAGNOSIS — E663 Overweight: Secondary | ICD-10-CM | POA: Diagnosis not present

## 2017-11-09 DIAGNOSIS — Z1389 Encounter for screening for other disorder: Secondary | ICD-10-CM | POA: Diagnosis not present

## 2017-11-09 DIAGNOSIS — G8929 Other chronic pain: Secondary | ICD-10-CM | POA: Diagnosis not present

## 2017-11-09 DIAGNOSIS — M1991 Primary osteoarthritis, unspecified site: Secondary | ICD-10-CM | POA: Diagnosis not present

## 2017-11-09 DIAGNOSIS — E1129 Type 2 diabetes mellitus with other diabetic kidney complication: Secondary | ICD-10-CM | POA: Diagnosis not present

## 2017-11-09 DIAGNOSIS — Z6829 Body mass index (BMI) 29.0-29.9, adult: Secondary | ICD-10-CM | POA: Diagnosis not present

## 2017-11-28 DIAGNOSIS — E538 Deficiency of other specified B group vitamins: Secondary | ICD-10-CM | POA: Diagnosis not present

## 2017-12-05 ENCOUNTER — Encounter (INDEPENDENT_AMBULATORY_CARE_PROVIDER_SITE_OTHER): Payer: Self-pay | Admitting: *Deleted

## 2017-12-11 DIAGNOSIS — J449 Chronic obstructive pulmonary disease, unspecified: Secondary | ICD-10-CM | POA: Diagnosis not present

## 2017-12-11 DIAGNOSIS — Z6829 Body mass index (BMI) 29.0-29.9, adult: Secondary | ICD-10-CM | POA: Diagnosis not present

## 2017-12-11 DIAGNOSIS — M1991 Primary osteoarthritis, unspecified site: Secondary | ICD-10-CM | POA: Diagnosis not present

## 2017-12-11 DIAGNOSIS — E1142 Type 2 diabetes mellitus with diabetic polyneuropathy: Secondary | ICD-10-CM | POA: Diagnosis not present

## 2017-12-11 DIAGNOSIS — I700 Atherosclerosis of aorta: Secondary | ICD-10-CM | POA: Diagnosis not present

## 2017-12-11 DIAGNOSIS — E663 Overweight: Secondary | ICD-10-CM | POA: Diagnosis not present

## 2017-12-15 DIAGNOSIS — Z1211 Encounter for screening for malignant neoplasm of colon: Secondary | ICD-10-CM | POA: Diagnosis not present

## 2017-12-24 ENCOUNTER — Ambulatory Visit (INDEPENDENT_AMBULATORY_CARE_PROVIDER_SITE_OTHER): Payer: Medicare Other | Admitting: Otolaryngology

## 2017-12-24 DIAGNOSIS — H9041 Sensorineural hearing loss, unilateral, right ear, with unrestricted hearing on the contralateral side: Secondary | ICD-10-CM | POA: Diagnosis not present

## 2017-12-24 DIAGNOSIS — H7201 Central perforation of tympanic membrane, right ear: Secondary | ICD-10-CM | POA: Diagnosis not present

## 2017-12-27 DIAGNOSIS — Z79899 Other long term (current) drug therapy: Secondary | ICD-10-CM | POA: Diagnosis not present

## 2017-12-27 DIAGNOSIS — D509 Iron deficiency anemia, unspecified: Secondary | ICD-10-CM | POA: Diagnosis not present

## 2017-12-27 DIAGNOSIS — R809 Proteinuria, unspecified: Secondary | ICD-10-CM | POA: Diagnosis not present

## 2017-12-27 DIAGNOSIS — I10 Essential (primary) hypertension: Secondary | ICD-10-CM | POA: Diagnosis not present

## 2017-12-27 DIAGNOSIS — E559 Vitamin D deficiency, unspecified: Secondary | ICD-10-CM | POA: Diagnosis not present

## 2017-12-27 DIAGNOSIS — N183 Chronic kidney disease, stage 3 (moderate): Secondary | ICD-10-CM | POA: Diagnosis not present

## 2018-01-03 DIAGNOSIS — E538 Deficiency of other specified B group vitamins: Secondary | ICD-10-CM | POA: Diagnosis not present

## 2018-01-31 DIAGNOSIS — E538 Deficiency of other specified B group vitamins: Secondary | ICD-10-CM | POA: Diagnosis not present

## 2018-02-10 DIAGNOSIS — J441 Chronic obstructive pulmonary disease with (acute) exacerbation: Secondary | ICD-10-CM | POA: Diagnosis not present

## 2018-02-10 DIAGNOSIS — J22 Unspecified acute lower respiratory infection: Secondary | ICD-10-CM | POA: Diagnosis not present

## 2018-02-10 DIAGNOSIS — J209 Acute bronchitis, unspecified: Secondary | ICD-10-CM | POA: Diagnosis not present

## 2018-02-10 DIAGNOSIS — J302 Other seasonal allergic rhinitis: Secondary | ICD-10-CM | POA: Diagnosis not present

## 2018-02-15 DIAGNOSIS — Z6829 Body mass index (BMI) 29.0-29.9, adult: Secondary | ICD-10-CM | POA: Diagnosis not present

## 2018-02-15 DIAGNOSIS — J069 Acute upper respiratory infection, unspecified: Secondary | ICD-10-CM | POA: Diagnosis not present

## 2018-02-15 DIAGNOSIS — J441 Chronic obstructive pulmonary disease with (acute) exacerbation: Secondary | ICD-10-CM | POA: Diagnosis not present

## 2018-02-15 DIAGNOSIS — E663 Overweight: Secondary | ICD-10-CM | POA: Diagnosis not present

## 2018-04-17 DIAGNOSIS — E538 Deficiency of other specified B group vitamins: Secondary | ICD-10-CM | POA: Diagnosis not present

## 2018-04-30 DIAGNOSIS — E1165 Type 2 diabetes mellitus with hyperglycemia: Secondary | ICD-10-CM | POA: Diagnosis not present

## 2018-04-30 DIAGNOSIS — E890 Postprocedural hypothyroidism: Secondary | ICD-10-CM | POA: Diagnosis not present

## 2018-05-08 DIAGNOSIS — Z23 Encounter for immunization: Secondary | ICD-10-CM | POA: Diagnosis not present

## 2018-05-08 DIAGNOSIS — N183 Chronic kidney disease, stage 3 (moderate): Secondary | ICD-10-CM | POA: Diagnosis not present

## 2018-05-08 DIAGNOSIS — E890 Postprocedural hypothyroidism: Secondary | ICD-10-CM | POA: Diagnosis not present

## 2018-05-08 DIAGNOSIS — Z808 Family history of malignant neoplasm of other organs or systems: Secondary | ICD-10-CM | POA: Diagnosis not present

## 2018-05-08 DIAGNOSIS — M810 Age-related osteoporosis without current pathological fracture: Secondary | ICD-10-CM | POA: Diagnosis not present

## 2018-05-08 DIAGNOSIS — I10 Essential (primary) hypertension: Secondary | ICD-10-CM | POA: Diagnosis not present

## 2018-05-08 DIAGNOSIS — E1165 Type 2 diabetes mellitus with hyperglycemia: Secondary | ICD-10-CM | POA: Diagnosis not present

## 2018-06-26 DIAGNOSIS — E559 Vitamin D deficiency, unspecified: Secondary | ICD-10-CM | POA: Diagnosis not present

## 2018-06-26 DIAGNOSIS — D509 Iron deficiency anemia, unspecified: Secondary | ICD-10-CM | POA: Diagnosis not present

## 2018-06-26 DIAGNOSIS — N183 Chronic kidney disease, stage 3 (moderate): Secondary | ICD-10-CM | POA: Diagnosis not present

## 2018-06-26 DIAGNOSIS — I10 Essential (primary) hypertension: Secondary | ICD-10-CM | POA: Diagnosis not present

## 2018-06-26 DIAGNOSIS — Z79899 Other long term (current) drug therapy: Secondary | ICD-10-CM | POA: Diagnosis not present

## 2018-06-26 DIAGNOSIS — R809 Proteinuria, unspecified: Secondary | ICD-10-CM | POA: Diagnosis not present

## 2018-07-10 DIAGNOSIS — E875 Hyperkalemia: Secondary | ICD-10-CM | POA: Diagnosis not present

## 2018-07-10 DIAGNOSIS — R809 Proteinuria, unspecified: Secondary | ICD-10-CM | POA: Diagnosis not present

## 2018-07-10 DIAGNOSIS — N2581 Secondary hyperparathyroidism of renal origin: Secondary | ICD-10-CM | POA: Diagnosis not present

## 2018-07-10 DIAGNOSIS — N183 Chronic kidney disease, stage 3 (moderate): Secondary | ICD-10-CM | POA: Diagnosis not present

## 2018-07-31 DIAGNOSIS — Z6829 Body mass index (BMI) 29.0-29.9, adult: Secondary | ICD-10-CM | POA: Diagnosis not present

## 2018-07-31 DIAGNOSIS — Z0000 Encounter for general adult medical examination without abnormal findings: Secondary | ICD-10-CM | POA: Diagnosis not present

## 2018-07-31 DIAGNOSIS — E538 Deficiency of other specified B group vitamins: Secondary | ICD-10-CM | POA: Diagnosis not present

## 2018-07-31 DIAGNOSIS — E663 Overweight: Secondary | ICD-10-CM | POA: Diagnosis not present

## 2018-07-31 DIAGNOSIS — Z1389 Encounter for screening for other disorder: Secondary | ICD-10-CM | POA: Diagnosis not present

## 2018-08-30 DIAGNOSIS — E538 Deficiency of other specified B group vitamins: Secondary | ICD-10-CM | POA: Diagnosis not present

## 2018-10-04 DIAGNOSIS — E538 Deficiency of other specified B group vitamins: Secondary | ICD-10-CM | POA: Diagnosis not present

## 2018-10-11 DIAGNOSIS — E663 Overweight: Secondary | ICD-10-CM | POA: Diagnosis not present

## 2018-10-11 DIAGNOSIS — Z6829 Body mass index (BMI) 29.0-29.9, adult: Secondary | ICD-10-CM | POA: Diagnosis not present

## 2018-10-11 DIAGNOSIS — J069 Acute upper respiratory infection, unspecified: Secondary | ICD-10-CM | POA: Diagnosis not present

## 2018-10-23 DIAGNOSIS — E663 Overweight: Secondary | ICD-10-CM | POA: Diagnosis not present

## 2018-10-23 DIAGNOSIS — E7849 Other hyperlipidemia: Secondary | ICD-10-CM | POA: Diagnosis not present

## 2018-10-23 DIAGNOSIS — J449 Chronic obstructive pulmonary disease, unspecified: Secondary | ICD-10-CM | POA: Diagnosis not present

## 2018-10-23 DIAGNOSIS — R201 Hypoesthesia of skin: Secondary | ICD-10-CM | POA: Diagnosis not present

## 2018-10-23 DIAGNOSIS — I10 Essential (primary) hypertension: Secondary | ICD-10-CM | POA: Diagnosis not present

## 2018-10-23 DIAGNOSIS — Z6828 Body mass index (BMI) 28.0-28.9, adult: Secondary | ICD-10-CM | POA: Diagnosis not present

## 2018-10-23 DIAGNOSIS — E118 Type 2 diabetes mellitus with unspecified complications: Secondary | ICD-10-CM | POA: Diagnosis not present

## 2018-10-26 DIAGNOSIS — Z1211 Encounter for screening for malignant neoplasm of colon: Secondary | ICD-10-CM | POA: Diagnosis not present

## 2018-11-05 DIAGNOSIS — D510 Vitamin B12 deficiency anemia due to intrinsic factor deficiency: Secondary | ICD-10-CM | POA: Diagnosis not present

## 2018-11-11 DIAGNOSIS — D72829 Elevated white blood cell count, unspecified: Secondary | ICD-10-CM | POA: Diagnosis not present

## 2019-01-01 ENCOUNTER — Other Ambulatory Visit (HOSPITAL_COMMUNITY): Payer: Self-pay | Admitting: Endocrinology

## 2019-01-01 DIAGNOSIS — M810 Age-related osteoporosis without current pathological fracture: Secondary | ICD-10-CM

## 2019-01-28 DIAGNOSIS — E538 Deficiency of other specified B group vitamins: Secondary | ICD-10-CM | POA: Diagnosis not present

## 2019-02-21 ENCOUNTER — Other Ambulatory Visit: Payer: Self-pay | Admitting: Internal Medicine

## 2019-02-21 ENCOUNTER — Other Ambulatory Visit: Payer: PPO

## 2019-02-21 DIAGNOSIS — Z20822 Contact with and (suspected) exposure to covid-19: Secondary | ICD-10-CM

## 2019-02-21 DIAGNOSIS — R6889 Other general symptoms and signs: Secondary | ICD-10-CM | POA: Diagnosis not present

## 2019-02-24 ENCOUNTER — Other Ambulatory Visit (HOSPITAL_COMMUNITY): Payer: Medicare Other

## 2019-02-28 LAB — NOVEL CORONAVIRUS, NAA: SARS-CoV-2, NAA: NOT DETECTED

## 2019-03-06 DIAGNOSIS — E538 Deficiency of other specified B group vitamins: Secondary | ICD-10-CM | POA: Diagnosis not present

## 2019-04-09 DIAGNOSIS — M545 Low back pain: Secondary | ICD-10-CM | POA: Diagnosis not present

## 2019-04-10 ENCOUNTER — Emergency Department (HOSPITAL_COMMUNITY): Payer: PPO

## 2019-04-10 ENCOUNTER — Encounter (HOSPITAL_COMMUNITY): Payer: Self-pay

## 2019-04-10 ENCOUNTER — Emergency Department (HOSPITAL_COMMUNITY)
Admission: EM | Admit: 2019-04-10 | Discharge: 2019-04-10 | Disposition: A | Discharge status: Home / Self Care | Payer: PPO | Attending: Emergency Medicine | Admitting: Emergency Medicine

## 2019-04-10 DIAGNOSIS — J45909 Unspecified asthma, uncomplicated: Secondary | ICD-10-CM | POA: Diagnosis not present

## 2019-04-10 DIAGNOSIS — I129 Hypertensive chronic kidney disease with stage 1 through stage 4 chronic kidney disease, or unspecified chronic kidney disease: Secondary | ICD-10-CM | POA: Diagnosis not present

## 2019-04-10 DIAGNOSIS — R109 Unspecified abdominal pain: Secondary | ICD-10-CM | POA: Diagnosis present

## 2019-04-10 DIAGNOSIS — Z79899 Other long term (current) drug therapy: Secondary | ICD-10-CM | POA: Insufficient documentation

## 2019-04-10 DIAGNOSIS — N183 Chronic kidney disease, stage 3 (moderate): Secondary | ICD-10-CM | POA: Diagnosis not present

## 2019-04-10 DIAGNOSIS — R52 Pain, unspecified: Secondary | ICD-10-CM | POA: Diagnosis not present

## 2019-04-10 DIAGNOSIS — N200 Calculus of kidney: Secondary | ICD-10-CM | POA: Diagnosis not present

## 2019-04-10 DIAGNOSIS — K869 Disease of pancreas, unspecified: Secondary | ICD-10-CM | POA: Diagnosis not present

## 2019-04-10 DIAGNOSIS — M5489 Other dorsalgia: Secondary | ICD-10-CM | POA: Diagnosis not present

## 2019-04-10 DIAGNOSIS — R1012 Left upper quadrant pain: Secondary | ICD-10-CM | POA: Diagnosis not present

## 2019-04-10 DIAGNOSIS — J449 Chronic obstructive pulmonary disease, unspecified: Secondary | ICD-10-CM | POA: Diagnosis not present

## 2019-04-10 DIAGNOSIS — I10 Essential (primary) hypertension: Secondary | ICD-10-CM | POA: Diagnosis not present

## 2019-04-10 DIAGNOSIS — E1122 Type 2 diabetes mellitus with diabetic chronic kidney disease: Secondary | ICD-10-CM | POA: Diagnosis not present

## 2019-04-10 DIAGNOSIS — K8689 Other specified diseases of pancreas: Secondary | ICD-10-CM | POA: Diagnosis not present

## 2019-04-10 DIAGNOSIS — K5730 Diverticulosis of large intestine without perforation or abscess without bleeding: Secondary | ICD-10-CM | POA: Diagnosis not present

## 2019-04-10 DIAGNOSIS — E161 Other hypoglycemia: Secondary | ICD-10-CM | POA: Diagnosis not present

## 2019-04-10 DIAGNOSIS — Z681 Body mass index (BMI) 19 or less, adult: Secondary | ICD-10-CM | POA: Diagnosis not present

## 2019-04-10 DIAGNOSIS — E162 Hypoglycemia, unspecified: Secondary | ICD-10-CM | POA: Diagnosis not present

## 2019-04-10 LAB — CBC WITH DIFFERENTIAL/PLATELET
Abs Immature Granulocytes: 0.05 10*3/uL (ref 0.00–0.07)
Basophils Absolute: 0 10*3/uL (ref 0.0–0.1)
Basophils Relative: 0 %
Eosinophils Absolute: 0.2 10*3/uL (ref 0.0–0.5)
Eosinophils Relative: 2 %
HCT: 40.2 % (ref 36.0–46.0)
Hemoglobin: 12.8 g/dL (ref 12.0–15.0)
Immature Granulocytes: 1 %
Lymphocytes Relative: 14 %
Lymphs Abs: 1.6 10*3/uL (ref 0.7–4.0)
MCH: 31.2 pg (ref 26.0–34.0)
MCHC: 31.8 g/dL (ref 30.0–36.0)
MCV: 98 fL (ref 80.0–100.0)
Monocytes Absolute: 0.8 10*3/uL (ref 0.1–1.0)
Monocytes Relative: 7 %
Neutro Abs: 8.3 10*3/uL — ABNORMAL HIGH (ref 1.7–7.7)
Neutrophils Relative %: 76 %
Platelets: 220 10*3/uL (ref 150–400)
RBC: 4.1 MIL/uL (ref 3.87–5.11)
RDW: 14 % (ref 11.5–15.5)
WBC: 10.8 10*3/uL — ABNORMAL HIGH (ref 4.0–10.5)
nRBC: 0 % (ref 0.0–0.2)

## 2019-04-10 LAB — COMPREHENSIVE METABOLIC PANEL
ALT: 13 U/L (ref 0–44)
AST: 18 U/L (ref 15–41)
Albumin: 4 g/dL (ref 3.5–5.0)
Alkaline Phosphatase: 67 U/L (ref 38–126)
Anion gap: 9 (ref 5–15)
BUN: 24 mg/dL — ABNORMAL HIGH (ref 8–23)
CO2: 23 mmol/L (ref 22–32)
Calcium: 9 mg/dL (ref 8.9–10.3)
Chloride: 108 mmol/L (ref 98–111)
Creatinine, Ser: 1.35 mg/dL — ABNORMAL HIGH (ref 0.44–1.00)
GFR calc Af Amer: 43 mL/min — ABNORMAL LOW (ref >60)
GFR calc non Af Amer: 37 mL/min — ABNORMAL LOW (ref >60)
Glucose, Bld: 160 mg/dL — ABNORMAL HIGH (ref 70–99)
Potassium: 4.2 mmol/L (ref 3.5–5.1)
Sodium: 140 mmol/L (ref 135–145)
Total Bilirubin: 0.7 mg/dL (ref 0.3–1.2)
Total Protein: 7.1 g/dL (ref 6.5–8.1)

## 2019-04-10 LAB — URINALYSIS, ROUTINE W REFLEX MICROSCOPIC
Bilirubin Urine: NEGATIVE
Glucose, UA: NEGATIVE mg/dL
Hgb urine dipstick: NEGATIVE
Ketones, ur: NEGATIVE mg/dL
Leukocytes,Ua: NEGATIVE
Nitrite: NEGATIVE
Protein, ur: 30 mg/dL — AB
Specific Gravity, Urine: 1.008 (ref 1.005–1.030)
pH: 7 (ref 5.0–8.0)

## 2019-04-10 MED ORDER — ACETAMINOPHEN 500 MG PO TABS
500.0000 mg | ORAL_TABLET | Freq: Once | ORAL | Status: AC
  Administered 2019-04-10: 12:00:00 500 mg via ORAL
  Filled 2019-04-10: qty 1

## 2019-04-10 NOTE — ED Notes (Signed)
Pt was informed we need urine sample. 

## 2019-04-10 NOTE — Discharge Instructions (Addendum)
I recommend continuing Tylenol as needed for pain control.  Recommend continuing steady hydration as discussed.  If you develop fever, worsening abdominal pain, vomiting, or other new concerns and recommend returning to the ER for reassessment.  Recommend scheduling appointment for Friday or Monday for recheck with your primary doctor.  Please discuss symptoms you are having today as well as the pancreatic lesion that was noted on CT scan.

## 2019-04-10 NOTE — ED Provider Notes (Signed)
[facility] EMERGENCY DEPARTMENT Provider Note   CSN: [id number] Arrival date & time: 04/10/19  1016     History   Chief Complaint Chief Complaint  Patient presents with  . Back Pain    HPI Lindsay Cortez is a 78 y.o. female.  Presents emergency department with chief complaint left flank pain.  States symptoms have been going on throughout the week this week.  Pain is 6 out of 10 in severity, left-sided, radiates along left flank.  States pain seems to have migrated down somewhat.  Now and left lower quadrant as well.  Dull ache.  Has been taking Tylenol with intermittent relief.  Has not taken any Tylenol today.  Had virtual visit, given Flexeril, recommend go to ER if worsening symptoms.  Has not had associated increased urinary frequency, dysuria, hematuria.  No fevers.    HPI  Past Medical History:  Diagnosis Date  . Arthritis   . Asthma   . Back pain   . COPD (chronic obstructive pulmonary disease) ([place])   . Diabetes ([place]) 09/01/2014  . Diabetes mellitus    x 15 yrs  . Gout   . Hypertension   . Pancreatic cyst   . Pneumonia   . Renal disorder   . Shoulder dislocation   . Vertigo     Patient Active Problem List   Diagnosis Date Noted  . Acute respiratory failure with hypoxia ([place]) 09/02/2014  . CKD (chronic kidney disease) stage 3, GFR 30-59 ml/min ([facility]) 09/02/2014  . Nausea and vomiting 09/01/2014  . COPD exacerbation ([place]) 09/01/2014  . Dehydration 09/01/2014  . Hypoxia 09/01/2014  . Diabetes ([place]) 09/01/2014  . Acute exacerbation of COPD with asthma ([place]) 09/01/2014  . Pancreatic mass 05/19/2014  . Common bile duct calculi 05/19/2014  . Right knee pain 09/18/2013  . Lateral meniscus derangement 09/18/2013  . Arthritis of right knee 09/18/2013    Past Surgical History:  Procedure Laterality Date  . ABDOMINAL HYSTERECTOMY    . BLADDER SURGERY     bladder tac  . BREAST SURGERY     benign  . FOOT SURGERY       OB History    Gravida  2   Para  2    Term  2   Preterm      AB      Living  2     SAB      TAB      Ectopic      Multiple      Live Births               Home Medications    Prior to Admission medications   Medication Sig Start Date End Date Taking? Authorizing Provider  acetaminophen (TYLENOL) 500 MG tablet Take 500 mg by mouth daily as needed (pain). For pain    [provider]  albuterol (PROVENTIL) (2.5 MG/3ML) 0.083% nebulizer solution Take 3 mLs (2.5 mg total) by nebulization every 4 (four) hours as needed for wheezing. 09/05/14   Samuella Cota, MD  albuterol (VENTOLIN HFA) 108 (90 BASE) MCG/ACT inhaler Inhale 2 puffs into the lungs every 4 (four) hours as needed for wheezing or shortness of breath. For rescue 09/05/14   Samuella Cota, MD  allopurinol (ZYLOPRIM) 100 MG tablet Take 200 mg by mouth every morning.      [provider]  amLODipine (NORVASC) 5 MG tablet Take 5 mg by mouth daily.    [provider]  budesonide-formoterol (  SYMBICORT) 80-4.5 MCG/ACT inhaler Inhale 2 puffs into the lungs 2 (two) times daily.     [provider]  Calcium Carbonate-Vitamin D (CALCIUM 600 + D PO) Take 1 tablet by mouth daily.      [provider]  Cholecalciferol (VITAMIN D) 2000 UNITS CAPS Take 1 capsule by mouth every morning.      [provider]  glimepiride (AMARYL) 2 MG tablet Take 2-4 mg by mouth 2 (two) times daily. 2 in the morning and 1 at bedtime    [provider]  loperamide (IMODIUM) 2 MG capsule Take 2 mg by mouth every morning.      [provider]  meclizine (ANTIVERT) 25 MG tablet Take 1 tablet (25 mg total) by mouth 3 (three) times daily as needed for dizziness. 11/12/15   Forde Dandy, MD  metoprolol (LOPRESSOR) 50 MG tablet Take 50 mg by mouth daily.      [provider]  promethazine (PHENERGAN) 25 MG suppository Place 1 suppository (25 mg total) rectally every 6 (six) hours as needed for nausea or vomiting.  11/12/15   Forde Dandy, MD  simvastatin (ZOCOR) 10 MG tablet Take 10 mg by mouth at bedtime.      [provider]    Family History Family History  Problem Relation Age of Onset  . Cancer Mother   . Emphysema Father     Social History Social History   Tobacco Use  . Smoking status: Never Smoker  . Smokeless tobacco: Never Used  Substance Use Topics  . Alcohol use: No    Alcohol/week: 0.0 standard drinks  . Drug use: No     Allergies   Fentanyl, Codeine, and Levaquin [levofloxacin in d5w]   Review of Systems Review of Systems  Constitutional: Negative for chills and fever.  HENT: Negative for ear pain and sore throat.   Eyes: Negative for pain and visual disturbance.  Respiratory: Negative for cough and shortness of breath.   Cardiovascular: Negative for chest pain and palpitations.  Gastrointestinal: Positive for abdominal pain. Negative for vomiting.       Mild tenderness palpation noted in left upper quadrant, left lower quadrant, left flank, no CVA tenderness  Genitourinary: Negative for dysuria and hematuria.  Musculoskeletal: Negative for arthralgias and back pain.  Skin: Negative for color change and rash.  Neurological: Negative for seizures and syncope.  All other systems reviewed and are negative.    Physical Exam Updated Vital Signs BP (!) 157/72 (BP Location: Left Arm) Comment: Simultaneous filing. User may not have seen previous data. Comment (BP Location): Simultaneous filing. User may not have seen previous data.  Pulse 83 Comment: Simultaneous filing. User may not have seen previous data.  Temp 98.4 F (36.9 C) (Oral) Comment: Simultaneous filing. User may not have seen previous data. Comment (Src): Simultaneous filing. User may not have seen previous data.  Resp 18   Ht 4\' 7"  (1.397 m)   Wt 63 kg   SpO2 96% Comment: Simultaneous filing. User may not have seen previous data.  BMI 32.31 kg/m   Physical Exam Vitals signs and nursing  note reviewed.  Constitutional:      General: She is not in acute distress.    Appearance: She is well-developed.  HENT:     Head: Normocephalic and atraumatic.  Eyes:     Conjunctiva/sclera: Conjunctivae normal.  Neck:     Musculoskeletal: Neck supple.  Cardiovascular:     Rate and Rhythm: Normal rate  and regular rhythm.     Heart sounds: No murmur.  Pulmonary:     Effort: Pulmonary effort is normal. No respiratory distress.     Breath sounds: Normal breath sounds.  Abdominal:     Palpations: Abdomen is soft.     Comments: Mild tenderness palpation noted in left upper quadrant, left lower quadrant, left flank, no CVA tenderness  Skin:    General: Skin is warm and dry.     Capillary Refill: Capillary refill takes less than 2 seconds.  Neurological:     Mental Status: She is alert.      ED Treatments / Results  Labs (all labs ordered are listed, but only abnormal results are displayed) Labs Reviewed  CBC WITH DIFFERENTIAL/PLATELET  URINALYSIS, ROUTINE W REFLEX MICROSCOPIC  COMPREHENSIVE METABOLIC PANEL    EKG None  Radiology No results found.  Procedures Procedures (including critical care time)  Medications Ordered in ED Medications - No data to display   Initial Impression / Assessment and Plan / ED Course  I have reviewed the triage vital signs and the nursing notes.  Pertinent labs & imaging results that were available during my care of the patient were reviewed by me and considered in my medical decision making (see chart for details).  Clinical Course as of Apr 09 1633  Thu Apr 10, 2019  1145 Completed initial assessment   [RD]  1307 Reviewed CT results   [RD]    Clinical Course User Index [RD] Lucrezia Starch, MD       Seven-hour presenting with left flank pain.  Urine negative for infection.  Labs without leukocytosis.  CT scan demonstrated bilateral kidney stone.  No obstructive pathology.  Suspect symptoms related to the left kidney  stone versus muscle strain.  Pain well controlled with Tylenol.  Patient otherwise well-appearing with stable vital signs.  Believe proper outpatient management this time, recommended recheck with primary doctor.  Incidental finding on CT of pancreatic lesion - had been previously documented - recommended discussing next steps with PCP.    After the discussed management above, the patient was determined to be safe for discharge.  The patient was in agreement with this plan and all questions regarding their care were answered.  ED return precautions were discussed and the patient will return to the ED with any significant worsening of condition.   Final Clinical Impressions(s) / ED Diagnoses   Final diagnoses:  Acute left flank pain  Nephrolithiasis  Pancreatic lesion    ED Discharge Orders    None       Lucrezia Starch, MD 04/10/19 [phone]

## 2019-04-10 NOTE — ED Triage Notes (Signed)
Pt reports lower back pain , Pain more to left. Pt feels she has kidney stone. Went to urgent care and was given flexeril

## 2019-04-14 DIAGNOSIS — N200 Calculus of kidney: Secondary | ICD-10-CM | POA: Diagnosis not present

## 2019-04-14 DIAGNOSIS — Z6828 Body mass index (BMI) 28.0-28.9, adult: Secondary | ICD-10-CM | POA: Diagnosis not present

## 2019-04-14 DIAGNOSIS — R531 Weakness: Secondary | ICD-10-CM | POA: Diagnosis not present

## 2019-04-14 DIAGNOSIS — E538 Deficiency of other specified B group vitamins: Secondary | ICD-10-CM | POA: Diagnosis not present

## 2019-04-17 DIAGNOSIS — M150 Primary generalized (osteo)arthritis: Secondary | ICD-10-CM | POA: Diagnosis not present

## 2019-04-17 DIAGNOSIS — M1991 Primary osteoarthritis, unspecified site: Secondary | ICD-10-CM | POA: Diagnosis not present

## 2019-04-17 DIAGNOSIS — J449 Chronic obstructive pulmonary disease, unspecified: Secondary | ICD-10-CM | POA: Diagnosis not present

## 2019-04-17 DIAGNOSIS — R531 Weakness: Secondary | ICD-10-CM | POA: Diagnosis not present

## 2019-04-25 DIAGNOSIS — Z0001 Encounter for general adult medical examination with abnormal findings: Secondary | ICD-10-CM | POA: Diagnosis not present

## 2019-04-25 DIAGNOSIS — Z6828 Body mass index (BMI) 28.0-28.9, adult: Secondary | ICD-10-CM | POA: Diagnosis not present

## 2019-04-25 DIAGNOSIS — G894 Chronic pain syndrome: Secondary | ICD-10-CM | POA: Diagnosis not present

## 2019-04-25 DIAGNOSIS — I10 Essential (primary) hypertension: Secondary | ICD-10-CM | POA: Diagnosis not present

## 2019-04-25 DIAGNOSIS — Z1389 Encounter for screening for other disorder: Secondary | ICD-10-CM | POA: Diagnosis not present

## 2019-04-25 DIAGNOSIS — E663 Overweight: Secondary | ICD-10-CM | POA: Diagnosis not present

## 2019-04-25 DIAGNOSIS — E1129 Type 2 diabetes mellitus with other diabetic kidney complication: Secondary | ICD-10-CM | POA: Diagnosis not present

## 2019-04-25 DIAGNOSIS — E782 Mixed hyperlipidemia: Secondary | ICD-10-CM | POA: Diagnosis not present

## 2019-04-25 DIAGNOSIS — K529 Noninfective gastroenteritis and colitis, unspecified: Secondary | ICD-10-CM | POA: Diagnosis not present

## 2019-05-16 DIAGNOSIS — E538 Deficiency of other specified B group vitamins: Secondary | ICD-10-CM | POA: Diagnosis not present

## 2019-05-18 DIAGNOSIS — M1991 Primary osteoarthritis, unspecified site: Secondary | ICD-10-CM | POA: Diagnosis not present

## 2019-05-18 DIAGNOSIS — M150 Primary generalized (osteo)arthritis: Secondary | ICD-10-CM | POA: Diagnosis not present

## 2019-05-18 DIAGNOSIS — R531 Weakness: Secondary | ICD-10-CM | POA: Diagnosis not present

## 2019-05-18 DIAGNOSIS — J449 Chronic obstructive pulmonary disease, unspecified: Secondary | ICD-10-CM | POA: Diagnosis not present

## 2019-05-26 DIAGNOSIS — E663 Overweight: Secondary | ICD-10-CM | POA: Diagnosis not present

## 2019-05-26 DIAGNOSIS — Z23 Encounter for immunization: Secondary | ICD-10-CM | POA: Diagnosis not present

## 2019-05-26 DIAGNOSIS — Z0001 Encounter for general adult medical examination with abnormal findings: Secondary | ICD-10-CM | POA: Diagnosis not present

## 2019-05-26 DIAGNOSIS — Z1389 Encounter for screening for other disorder: Secondary | ICD-10-CM | POA: Diagnosis not present

## 2019-05-26 DIAGNOSIS — Z6828 Body mass index (BMI) 28.0-28.9, adult: Secondary | ICD-10-CM | POA: Diagnosis not present

## 2019-06-04 DIAGNOSIS — Z6828 Body mass index (BMI) 28.0-28.9, adult: Secondary | ICD-10-CM | POA: Diagnosis not present

## 2019-06-04 DIAGNOSIS — E663 Overweight: Secondary | ICD-10-CM | POA: Diagnosis not present

## 2019-06-04 DIAGNOSIS — R319 Hematuria, unspecified: Secondary | ICD-10-CM | POA: Diagnosis not present

## 2019-06-04 DIAGNOSIS — E559 Vitamin D deficiency, unspecified: Secondary | ICD-10-CM | POA: Diagnosis not present

## 2019-06-04 DIAGNOSIS — D72829 Elevated white blood cell count, unspecified: Secondary | ICD-10-CM | POA: Diagnosis not present

## 2019-06-04 DIAGNOSIS — R39198 Other difficulties with micturition: Secondary | ICD-10-CM | POA: Diagnosis not present

## 2019-06-10 ENCOUNTER — Other Ambulatory Visit: Payer: Self-pay

## 2019-06-10 DIAGNOSIS — Z20822 Contact with and (suspected) exposure to covid-19: Secondary | ICD-10-CM

## 2019-06-10 DIAGNOSIS — Z20828 Contact with and (suspected) exposure to other viral communicable diseases: Secondary | ICD-10-CM | POA: Diagnosis not present

## 2019-06-12 DIAGNOSIS — R809 Proteinuria, unspecified: Secondary | ICD-10-CM | POA: Diagnosis not present

## 2019-06-12 DIAGNOSIS — E1122 Type 2 diabetes mellitus with diabetic chronic kidney disease: Secondary | ICD-10-CM | POA: Diagnosis not present

## 2019-06-12 DIAGNOSIS — N200 Calculus of kidney: Secondary | ICD-10-CM | POA: Diagnosis not present

## 2019-06-12 DIAGNOSIS — N189 Chronic kidney disease, unspecified: Secondary | ICD-10-CM | POA: Diagnosis not present

## 2019-06-12 DIAGNOSIS — E1129 Type 2 diabetes mellitus with other diabetic kidney complication: Secondary | ICD-10-CM | POA: Diagnosis not present

## 2019-06-12 DIAGNOSIS — I129 Hypertensive chronic kidney disease with stage 1 through stage 4 chronic kidney disease, or unspecified chronic kidney disease: Secondary | ICD-10-CM | POA: Diagnosis not present

## 2019-06-12 DIAGNOSIS — E211 Secondary hyperparathyroidism, not elsewhere classified: Secondary | ICD-10-CM | POA: Diagnosis not present

## 2019-06-12 DIAGNOSIS — N390 Urinary tract infection, site not specified: Secondary | ICD-10-CM | POA: Diagnosis not present

## 2019-06-12 LAB — NOVEL CORONAVIRUS, NAA: SARS-CoV-2, NAA: NOT DETECTED

## 2019-06-17 DIAGNOSIS — R531 Weakness: Secondary | ICD-10-CM | POA: Diagnosis not present

## 2019-06-17 DIAGNOSIS — M1991 Primary osteoarthritis, unspecified site: Secondary | ICD-10-CM | POA: Diagnosis not present

## 2019-06-17 DIAGNOSIS — M150 Primary generalized (osteo)arthritis: Secondary | ICD-10-CM | POA: Diagnosis not present

## 2019-06-17 DIAGNOSIS — J449 Chronic obstructive pulmonary disease, unspecified: Secondary | ICD-10-CM | POA: Diagnosis not present

## 2019-07-10 ENCOUNTER — Encounter (HOSPITAL_COMMUNITY): Payer: Self-pay | Admitting: Surgery

## 2019-07-10 ENCOUNTER — Other Ambulatory Visit: Payer: Self-pay

## 2019-07-11 ENCOUNTER — Encounter (HOSPITAL_COMMUNITY): Payer: Self-pay | Admitting: Hematology

## 2019-07-11 ENCOUNTER — Inpatient Hospital Stay (HOSPITAL_COMMUNITY): Payer: PPO | Attending: Hematology | Admitting: Hematology

## 2019-07-11 ENCOUNTER — Inpatient Hospital Stay (HOSPITAL_COMMUNITY): Payer: PPO

## 2019-07-11 VITALS — BP 148/75 | HR 88 | Temp 97.3°F | Resp 18 | Ht <= 58 in | Wt 136.0 lb

## 2019-07-11 DIAGNOSIS — E785 Hyperlipidemia, unspecified: Secondary | ICD-10-CM | POA: Diagnosis not present

## 2019-07-11 DIAGNOSIS — D72829 Elevated white blood cell count, unspecified: Secondary | ICD-10-CM

## 2019-07-11 DIAGNOSIS — N183 Chronic kidney disease, stage 3 unspecified: Secondary | ICD-10-CM | POA: Diagnosis not present

## 2019-07-11 DIAGNOSIS — I129 Hypertensive chronic kidney disease with stage 1 through stage 4 chronic kidney disease, or unspecified chronic kidney disease: Secondary | ICD-10-CM | POA: Insufficient documentation

## 2019-07-11 DIAGNOSIS — E1122 Type 2 diabetes mellitus with diabetic chronic kidney disease: Secondary | ICD-10-CM | POA: Diagnosis not present

## 2019-07-11 DIAGNOSIS — Z79899 Other long term (current) drug therapy: Secondary | ICD-10-CM | POA: Insufficient documentation

## 2019-07-11 DIAGNOSIS — Z7984 Long term (current) use of oral hypoglycemic drugs: Secondary | ICD-10-CM | POA: Insufficient documentation

## 2019-07-11 DIAGNOSIS — Z87442 Personal history of urinary calculi: Secondary | ICD-10-CM | POA: Diagnosis not present

## 2019-07-11 DIAGNOSIS — M1990 Unspecified osteoarthritis, unspecified site: Secondary | ICD-10-CM | POA: Insufficient documentation

## 2019-07-11 DIAGNOSIS — M109 Gout, unspecified: Secondary | ICD-10-CM | POA: Insufficient documentation

## 2019-07-11 DIAGNOSIS — J449 Chronic obstructive pulmonary disease, unspecified: Secondary | ICD-10-CM | POA: Diagnosis not present

## 2019-07-11 LAB — CBC WITH DIFFERENTIAL/PLATELET
Abs Immature Granulocytes: 0.12 10*3/uL — ABNORMAL HIGH (ref 0.00–0.07)
Basophils Absolute: 0.1 10*3/uL (ref 0.0–0.1)
Basophils Relative: 0 %
Eosinophils Absolute: 0.2 10*3/uL (ref 0.0–0.5)
Eosinophils Relative: 2 %
HCT: 40.2 % (ref 36.0–46.0)
Hemoglobin: 13 g/dL (ref 12.0–15.0)
Immature Granulocytes: 1 %
Lymphocytes Relative: 19 %
Lymphs Abs: 2.2 10*3/uL (ref 0.7–4.0)
MCH: 31.9 pg (ref 26.0–34.0)
MCHC: 32.3 g/dL (ref 30.0–36.0)
MCV: 98.5 fL (ref 80.0–100.0)
Monocytes Absolute: 0.9 10*3/uL (ref 0.1–1.0)
Monocytes Relative: 8 %
Neutro Abs: 8.3 10*3/uL — ABNORMAL HIGH (ref 1.7–7.7)
Neutrophils Relative %: 70 %
Platelets: 253 10*3/uL (ref 150–400)
RBC: 4.08 MIL/uL (ref 3.87–5.11)
RDW: 14.5 % (ref 11.5–15.5)
WBC: 11.8 10*3/uL — ABNORMAL HIGH (ref 4.0–10.5)
nRBC: 0 % (ref 0.0–0.2)

## 2019-07-11 LAB — COMPREHENSIVE METABOLIC PANEL
ALT: 18 U/L (ref 0–44)
AST: 24 U/L (ref 15–41)
Albumin: 4.3 g/dL (ref 3.5–5.0)
Alkaline Phosphatase: 81 U/L (ref 38–126)
Anion gap: 10 (ref 5–15)
BUN: 38 mg/dL — ABNORMAL HIGH (ref 8–23)
CO2: 23 mmol/L (ref 22–32)
Calcium: 9.9 mg/dL (ref 8.9–10.3)
Chloride: 110 mmol/L (ref 98–111)
Creatinine, Ser: 1.6 mg/dL — ABNORMAL HIGH (ref 0.44–1.00)
GFR calc Af Amer: 35 mL/min — ABNORMAL LOW (ref >60)
GFR calc non Af Amer: 30 mL/min — ABNORMAL LOW (ref >60)
Glucose, Bld: 171 mg/dL — ABNORMAL HIGH (ref 70–99)
Potassium: 4.2 mmol/L (ref 3.5–5.1)
Sodium: 143 mmol/L (ref 135–145)
Total Bilirubin: 1 mg/dL (ref 0.3–1.2)
Total Protein: 7.7 g/dL (ref 6.5–8.1)

## 2019-07-11 LAB — LACTATE DEHYDROGENASE: LDH: 143 U/L (ref 98–192)

## 2019-07-11 LAB — SEDIMENTATION RATE: Sed Rate: 18 mm/hr (ref 0–22)

## 2019-07-11 NOTE — Assessment & Plan Note (Addendum)
1. Leukocytosis -Most likely reactive. -CBC from April 10, 2019 revealed a white blood cell count of 10.8, neutrophil count of 8.3. -CBC from 05/27/2019 revealed a white blood cell count of 12.2, neutrophil count of 8.9 and monocytes were elevated at 1.6. -No prior history of leukocytosis.   -We discussed differential diagnosis of leukocytosis including underlying viral infection, inflammation, autoimmune disorders, or bone marrow disorder. -Today we will perform a complete lab work-up to rule out patient's underlying etiology. -Patient return to clinic in 3 weeks for follow-up.  2.  Chronic kidney disease -Patient currently has stage III kidney disease she does follow-up with nephrology regularly.  3.  Family history -Mother breast cancer, lymphoma

## 2019-07-11 NOTE — Progress Notes (Signed)
.  skf

## 2019-07-11 NOTE — Progress Notes (Signed)
[facility] Initial Visit:  Patient Care Team: Redmond School, MD as PCP - General (Internal Medicine)  CHIEF COMPLAINTS/PURPOSE OF CONSULTATION:  Leukocytosis     HISTORY OF PRESENTING ILLNESS: Lindsay Cortez 78 y.o. female presents today as a new patient.  She is referred by her primary care provider for leukocytosis.  He has a past medical history significant for COPD, gout, hypertension, diabetes, hyperlipidemia, and vertigo.  Patient was noted on her last 2 blood draws to have elevated white blood cell count.  Patient has no prior history of leukocytosis.  Patient does admit to having a GI viral infection during this time.  Patient states her symptoms lasted approximately 3 weeks.  Patient presented to [facility] emergency room on April 10, 2019 with reports of renal stone.  CT renal stone study revealed borderline gallbladder hydrops.  There was a hypoattenuated lesion in the body of the pancreas which is stable from 2018.  She has an enlarging splenic cyst measuring 6.5 cm.  Lastly, linear scarring noted in the right lung base with associated pulmonary nodule versus nodular subpleural scarring measures 7 mm.  CBC at that time revealed a white blood cell count of 10.8 and a neutrophil count of 8.3.  Repeat labs 1 month later with her primary care provider revealed a white blood cell count of 12.2 neutrophil count of 8.1 and a monocyte count of 1.6.  Patient denies any fevers, chills, night sweats.  She denies any lymphadenopathy.  She denies a history of smoking, alcohol abuse or illicit drug use.  She states she feels at her baseline.  She has been walking with a walker for the past 6 years due to degenerative disc disease.  She does have a family history significant for breast cancer and lymphoma in her mother.    Review of Systems  Constitutional: Positive for fatigue.  HENT:  Negative.   Eyes: Negative.   Respiratory: Negative.   Cardiovascular: Negative.    Gastrointestinal: Negative.   Endocrine: Negative.   Genitourinary: Negative.    Musculoskeletal: Positive for arthralgias, back pain, gait problem and myalgias.  Skin: Negative.   Neurological: Positive for extremity weakness and gait problem.  Hematological: Negative.   Psychiatric/Behavioral: Negative.     MEDICAL HISTORY: Past Medical History:  Diagnosis Date  . Arthritis   . Asthma   . Back pain   . COPD (chronic obstructive pulmonary disease) ([place])   . Diabetes ([place]) 09/01/2014  . Diabetes mellitus    x 15 yrs  . Gout   . Hypertension   . Kidney stones   . Pancreatic cyst   . Pneumonia   . Renal disorder   . Shoulder dislocation   . Vertigo     SURGICAL HISTORY: Past Surgical History:  Procedure Laterality Date  . ABDOMINAL HYSTERECTOMY    . BLADDER SURGERY     bladder tac  . BREAST SURGERY     benign  . FOOT SURGERY      SOCIAL HISTORY: Social History   Socioeconomic History  . Marital status: Widowed    Spouse name: Not on file  . Number of children: 2  . Years of education: Not on file  . Highest education level: Not on file  Occupational History    Employer: RETIRED  Social Needs  . Financial resource strain: Not on file  . Food insecurity    Worry: Not on file    Inability: Not on file  . Transportation needs  Medical: Not on file    Non-medical: Not on file  Tobacco Use  . Smoking status: Never Smoker  . Smokeless tobacco: Never Used  Substance and Sexual Activity  . Alcohol use: No    Alcohol/week: 0.0 standard drinks  . Drug use: No  . Sexual activity: Not on file  Lifestyle  . Physical activity    Days per week: Not on file    Minutes per session: Not on file  . Stress: Not on file  Relationships  . Social Herbalist on phone: Not on file    Gets together: Not on file    Attends religious service: Not on file    Active member of club or organization: Not on file    Attends meetings of clubs or organizations: Not  on file    Relationship status: Not on file  . Intimate partner violence    Fear of current or ex partner: Not on file    Emotionally abused: Not on file    Physically abused: Not on file    Forced sexual activity: Not on file  Other Topics Concern  . Not on file  Social History Narrative  . Not on file    FAMILY HISTORY Family History  Problem Relation Age of Onset  . Breast cancer Mother   . Emphysema Father   . COPD Sister   . Atrial fibrillation Sister   . Obesity Brother   . Cirrhosis Daughter   . COPD Son   . Diabetes Brother   . Asthma Brother     ALLERGIES:  is allergic to fentanyl; codeine; and levaquin [levofloxacin in d5w].  MEDICATIONS:  Current Outpatient Medications  Medication Sig Dispense Refill  . acetaminophen (TYLENOL) 500 MG tablet Take 500 mg by mouth daily as needed (pain). For pain    . albuterol (PROVENTIL) (2.5 MG/3ML) 0.083% nebulizer solution Take 3 mLs (2.5 mg total) by nebulization every 4 (four) hours as needed for wheezing. 75 mL 1  . albuterol (VENTOLIN HFA) 108 (90 BASE) MCG/ACT inhaler Inhale 2 puffs into the lungs every 4 (four) hours as needed for wheezing or shortness of breath. For rescue 1 Inhaler 1  . allopurinol (ZYLOPRIM) 100 MG tablet Take 200 mg by mouth daily.    Marland Kitchen amLODipine (NORVASC) 5 MG tablet Take 5 mg by mouth daily.    . budesonide-formoterol (SYMBICORT) 80-4.5 MCG/ACT inhaler Inhale 2 puffs into the lungs 2 (two) times daily.     . Calcium Carbonate-Vitamin D (CALCIUM 600 + D PO) Take 1 tablet by mouth daily.      . Cholecalciferol (VITAMIN D) 2000 UNITS CAPS Take 1 capsule by mouth every morning.      . cyclobenzaprine (FLEXERIL) 5 MG tablet Take 5 mg by mouth 3 (three) times daily as needed.    Marland Kitchen glimepiride (AMARYL) 2 MG tablet Take 2-4 mg by mouth 2 (two) times daily. 2 in the morning and 1 at bedtime    . loperamide (IMODIUM) 2 MG capsule Take 2 mg by mouth every morning.      . meclizine (ANTIVERT) 25 MG tablet Take  1 tablet (25 mg total) by mouth 3 (three) times daily as needed for dizziness. 30 tablet 0  . metoprolol succinate (TOPROL-XL) 50 MG 24 hr tablet Take 50 mg by mouth daily.    . promethazine (PHENERGAN) 25 MG suppository Place 1 suppository (25 mg total) rectally every 6 (six) hours as needed for nausea or vomiting. 12  each 0  . simvastatin (ZOCOR) 10 MG tablet Take 10 mg by mouth at bedtime.       No current facility-administered medications for this visit.     PHYSICAL EXAMINATION:  ECOG PERFORMANCE STATUS: 1 - Symptomatic but completely ambulatory   Vitals:   07/11/19 1028  BP: (!) 148/75  Pulse: 88  Resp: 18  Temp: (!) 97.3 F (36.3 C)  SpO2: 96%    Filed Weights   07/11/19 1028  Weight: 136 lb (61.7 kg)     Physical Exam Constitutional:      Appearance: Normal appearance.  HENT:     Head: Normocephalic.     Right Ear: External ear normal.     Left Ear: External ear normal.     Nose: Nose normal.     Mouth/Throat:     Pharynx: Oropharynx is clear.  Eyes:     Conjunctiva/sclera: Conjunctivae normal.  Neck:     Musculoskeletal: Normal range of motion.  Cardiovascular:     Rate and Rhythm: Normal rate and regular rhythm.     Pulses: Normal pulses.     Heart sounds: Normal heart sounds.  Pulmonary:     Effort: Pulmonary effort is normal.     Breath sounds: Normal breath sounds.  Abdominal:     General: Bowel sounds are normal.  Musculoskeletal:     Comments: Decreased ROM  Skin:    General: Skin is warm.  Neurological:     General: No focal deficit present.     Mental Status: She is alert and oriented to person, place, and time.  Psychiatric:        Mood and Affect: Mood normal.        Behavior: Behavior normal.        Thought Content: Thought content normal.        Judgment: Judgment normal.      LABORATORY DATA: I have personally reviewed the data as listed:  No visits with results within 1 Month(s) from this visit.  Latest known visit with  results is:  Orders Only on 06/10/2019  Component Date Value Ref Range Status  . SARS-CoV-2, NAA 06/10/2019 Not Detected  Not Detected Final   Comment: This nucleic acid amplification test was developed and its performance characteristics determined by [employer]. Nucleic acid amplification tests include PCR and TMA. This test has not been FDA cleared or approved. This test has been authorized by FDA under an Emergency Use Authorization (EUA). This test is only authorized for the duration of time the declaration that circumstances exist justifying the authorization of the emergency use of in vitro diagnostic tests for detection of SARS-CoV-2 virus and/or diagnosis of COVID-19 infection under section 564(b)(1) of the Act, 21 U.S.C. 683MHD-6(Q) (1), unless the authorization is terminated or revoked sooner. When diagnostic testing is negative, the possibility of a false negative result should be considered in the context of a patient's recent exposures and the presence of clinical signs and symptoms consistent with COVID-19. An individual without symptoms of COVID-19 and who is not shedding SARS-CoV-2 virus would                           expect to have a negative (not detected) result in this assay.     ASSESSMENT/PLAN   Leukocytosis 1. Leukocytosis -Most likely reactive. -CBC from April 10, 2019 revealed a white blood cell count of 10.8, neutrophil count of 8.3. -CBC from 05/27/2019 revealed a  white blood cell count of 12.2, neutrophil count of 8.9 and monocytes were elevated at 1.6. -No prior history of leukocytosis.   -We discussed differential diagnosis of leukocytosis including underlying viral infection, inflammation, autoimmune disorders, or bone marrow disorder. -Today we will perform a complete lab work-up to rule out patient's underlying etiology. -Patient return to clinic in 3 weeks for follow-up.  2.  Chronic kidney disease -Patient currently has stage III  kidney disease she does follow-up with nephrology regularly.  3.  Family history -Mother breast cancer, lymphoma  Addendum: -This is a 78 year old very nice female seen in consultation today for leukocytosis.  She has very mild leukocytosis, predominantly neutrophilic.  No B symptoms reported.  No recurrent infections or chronic systemic steroid use.  No history of autoimmune disorders.  I have independently elicited history and conducted physical exam.  We will send for Jak 2 V6 1 7 mutation and BCR/ABL mutation testing to rule out chronic myeloproliferative disorders.  We will also check for connective tissue disorders.  We will see her back in 2 to 3 weeks for follow-up and discuss the results.  Orders Placed This Encounter  Procedures  . CBC with Differential  . Comprehensive metabolic panel  . Pathologist smear review  . BCR-ABL1, CML/ALL, PCR, QUANT  . Lactate dehydrogenase  . Sedimentation rate  . JAK2 genotypr    Standing Status:   Future    Standing Expiration Date:   07/10/2020  . ANA, IFA (with reflex)  . Rheumatoid factor

## 2019-07-13 LAB — RHEUMATOID FACTOR: Rheumatoid fact SerPl-aCnc: 12.2 IU/mL (ref 0.0–13.9)

## 2019-07-14 LAB — PATHOLOGIST SMEAR REVIEW

## 2019-07-16 LAB — BCR-ABL1, CML/ALL, PCR, QUANT

## 2019-07-16 LAB — ANTINUCLEAR ANTIBODIES, IFA: ANA Ab, IFA: NEGATIVE

## 2019-07-18 DIAGNOSIS — M1991 Primary osteoarthritis, unspecified site: Secondary | ICD-10-CM | POA: Diagnosis not present

## 2019-07-18 DIAGNOSIS — R531 Weakness: Secondary | ICD-10-CM | POA: Diagnosis not present

## 2019-07-18 DIAGNOSIS — M150 Primary generalized (osteo)arthritis: Secondary | ICD-10-CM | POA: Diagnosis not present

## 2019-07-18 DIAGNOSIS — J449 Chronic obstructive pulmonary disease, unspecified: Secondary | ICD-10-CM | POA: Diagnosis not present

## 2019-07-21 DIAGNOSIS — R300 Dysuria: Secondary | ICD-10-CM | POA: Diagnosis not present

## 2019-07-21 LAB — JAK2 GENOTYPR

## 2019-07-28 ENCOUNTER — Other Ambulatory Visit: Payer: Self-pay

## 2019-07-29 ENCOUNTER — Inpatient Hospital Stay (HOSPITAL_COMMUNITY): Payer: PPO | Attending: Hematology | Admitting: Hematology

## 2019-07-29 DIAGNOSIS — Z79899 Other long term (current) drug therapy: Secondary | ICD-10-CM | POA: Diagnosis not present

## 2019-07-29 DIAGNOSIS — N183 Chronic kidney disease, stage 3 unspecified: Secondary | ICD-10-CM | POA: Diagnosis not present

## 2019-07-29 DIAGNOSIS — E1122 Type 2 diabetes mellitus with diabetic chronic kidney disease: Secondary | ICD-10-CM | POA: Insufficient documentation

## 2019-07-29 DIAGNOSIS — Z7984 Long term (current) use of oral hypoglycemic drugs: Secondary | ICD-10-CM | POA: Diagnosis not present

## 2019-07-29 DIAGNOSIS — M109 Gout, unspecified: Secondary | ICD-10-CM | POA: Diagnosis not present

## 2019-07-29 DIAGNOSIS — J449 Chronic obstructive pulmonary disease, unspecified: Secondary | ICD-10-CM | POA: Insufficient documentation

## 2019-07-29 DIAGNOSIS — M1990 Unspecified osteoarthritis, unspecified site: Secondary | ICD-10-CM | POA: Diagnosis not present

## 2019-07-29 DIAGNOSIS — I129 Hypertensive chronic kidney disease with stage 1 through stage 4 chronic kidney disease, or unspecified chronic kidney disease: Secondary | ICD-10-CM | POA: Diagnosis not present

## 2019-07-29 DIAGNOSIS — D72829 Elevated white blood cell count, unspecified: Secondary | ICD-10-CM | POA: Insufficient documentation

## 2019-08-04 NOTE — Assessment & Plan Note (Signed)
1. Leukocytosis -Most likely reactive. -CBC from April 10, 2019 revealed a white blood cell count of 10.8, neutrophil count of 8.3. -CBC from 05/27/2019 revealed a white blood cell count of 12.2, neutrophil count of 8.9 and monocytes were elevated at 1.6. -No prior history of leukocytosis.   -We discussed differential diagnosis of leukocytosis including underlying viral infection, inflammation, autoimmune disorders, or bone marrow disorder. -Lab work-up was negative for underlying etiology of leukocytosis.  Jak 2 -, rheumatoid factor negative ANA negative, BCR ABL negative.  Peripheral smear consistent with mild leukocytosis with neutrophilia.  LDH and sed rate within normal.  Patient has no B symptoms. -At this time we will just continue to monitor.  No intervention is indicated at this time.   2.  Chronic kidney disease -Patient currently has stage III kidney disease she does follow-up with nephrology regularly.  3.  Family history -Mother breast cancer, lymphoma

## 2019-08-04 NOTE — Progress Notes (Signed)
[place], [place] [address]   CLINIC:  Medical Oncology/Hematology  PCP:  [facility], [place] [address] [phone]   REASON FOR VISIT:  Follow-up for leukocytosis  CURRENT THERAPY: Clinical surveillance   INTERVAL HISTORY:  Ms. Drozdowski 78 y.o. female presents today for follow-up.  She reports overall doing well.  Her daughter accompanies her today.  She denies any significant fatigue.  Denies any change in bowel habits.  No obvious signs of bleeding.  Appetite is stable.  No weight loss.  Denies any fevers, chills, night sweats.  She is here for follow-up of lab work.   REVIEW OF SYSTEMS:  Review of Systems  Constitutional: Negative.   HENT:  Negative.   Eyes: Negative.   Respiratory: Negative.   Cardiovascular: Negative.   Gastrointestinal: Negative.   Endocrine: Negative.   Genitourinary: Negative.    Musculoskeletal: Positive for arthralgias, gait problem and myalgias.  Skin: Negative.   Neurological: Positive for extremity weakness and gait problem.  Hematological: Negative.   Psychiatric/Behavioral: Negative.      PAST MEDICAL/SURGICAL HISTORY:  Past Medical History:  Diagnosis Date  . Arthritis   . Asthma   . Back pain   . COPD (chronic obstructive pulmonary disease) ([place])   . Diabetes ([place]) 09/01/2014  . Diabetes mellitus    x 15 yrs  . Gout   . Hypertension   . Kidney stones   . Pancreatic cyst   . Pneumonia   . Renal disorder   . Shoulder dislocation   . Vertigo    Past Surgical History:  Procedure Laterality Date  . ABDOMINAL HYSTERECTOMY    . BLADDER SURGERY     bladder tac  . BREAST SURGERY     benign  . FOOT SURGERY       SOCIAL HISTORY:  Social History   Socioeconomic History  . Marital status: Widowed    Spouse name: Not on file  . Number of children: 2  . Years of education: Not on file  . Highest education level: Not on file  Occupational History   Employer: RETIRED  Social Needs  . Financial resource strain: Not on file  . Food insecurity    Worry: Not on file    Inability: Not on file  . Transportation needs    Medical: Not on file    Non-medical: Not on file  Tobacco Use  . Smoking status: Never Smoker  . Smokeless tobacco: Never Used  Substance and Sexual Activity  . Alcohol use: No    Alcohol/week: 0.0 standard drinks  . Drug use: No  . Sexual activity: Not on file  Lifestyle  . Physical activity    Days per week: Not on file    Minutes per session: Not on file  . Stress: Not on file  Relationships  . Social Herbalist on phone: Not on file    Gets together: Not on file    Attends religious service: Not on file    Active member of club or organization: Not on file    Attends meetings of clubs or organizations: Not on file    Relationship status: Not on file  . Intimate partner violence    Fear of current or ex partner: Not on file    Emotionally abused: Not on file    Physically abused: Not on file    Forced sexual activity: Not on file  Other Topics Concern  .  Not on file  Social History Narrative  . Not on file    FAMILY HISTORY:  Family History  Problem Relation Age of Onset  . Breast cancer Mother   . Emphysema Father   . COPD Sister   . Atrial fibrillation Sister   . Obesity Brother   . Cirrhosis Daughter   . COPD Son   . Diabetes Brother   . Asthma Brother     CURRENT MEDICATIONS:  Outpatient Encounter Medications as of 07/29/2019  Medication Sig  . allopurinol (ZYLOPRIM) 100 MG tablet Take 200 mg by mouth daily.  Marland Kitchen amLODipine (NORVASC) 5 MG tablet Take 5 mg by mouth daily.  . budesonide-formoterol (SYMBICORT) 80-4.5 MCG/ACT inhaler Inhale 2 puffs into the lungs 2 (two) times daily.   . Calcium Carbonate-Vitamin D (CALCIUM 600 + D PO) Take 1 tablet by mouth daily.    . Cholecalciferol (VITAMIN D) 2000 UNITS CAPS Take 1 capsule by mouth every morning.    Marland Kitchen glimepiride (AMARYL) 2  MG tablet Take 2-4 mg by mouth 2 (two) times daily. 2 in the morning and 1 at bedtime  . loperamide (IMODIUM) 2 MG capsule Take 2 mg by mouth every morning.    . metoprolol succinate (TOPROL-XL) 50 MG 24 hr tablet Take 50 mg by mouth daily.  . simvastatin (ZOCOR) 10 MG tablet Take 10 mg by mouth at bedtime.    Marland Kitchen acetaminophen (TYLENOL) 500 MG tablet Take 500 mg by mouth daily as needed (pain). For pain  . albuterol (PROVENTIL) (2.5 MG/3ML) 0.083% nebulizer solution Take 3 mLs (2.5 mg total) by nebulization every 4 (four) hours as needed for wheezing. (Patient not taking: Reported on 07/29/2019)  . albuterol (VENTOLIN HFA) 108 (90 BASE) MCG/ACT inhaler Inhale 2 puffs into the lungs every 4 (four) hours as needed for wheezing or shortness of breath. For rescue (Patient not taking: Reported on 07/29/2019)  . meclizine (ANTIVERT) 25 MG tablet Take 1 tablet (25 mg total) by mouth 3 (three) times daily as needed for dizziness. (Patient not taking: Reported on 07/29/2019)  . promethazine (PHENERGAN) 25 MG suppository Place 1 suppository (25 mg total) rectally every 6 (six) hours as needed for nausea or vomiting. (Patient not taking: Reported on 07/29/2019)  . [DISCONTINUED] cyclobenzaprine (FLEXERIL) 5 MG tablet Take 5 mg by mouth 3 (three) times daily as needed.   No facility-administered encounter medications on file as of 07/29/2019.     ALLERGIES:  Allergies  Allergen Reactions  . Fentanyl Nausea And Vomiting  . Codeine     Narcotic pain medicine makes her nauseated.  Says was told to only take tylenol due to kidney disease.  Mack Hook [Levofloxacin In D5w] Nausea And Vomiting     PHYSICAL EXAM:  ECOG Performance status: 1  Vitals:   07/29/19 1359  BP: 134/76  Pulse: 83  Resp: 18  Temp: (!) 97.4 F (36.3 C)  SpO2: 95%   Filed Weights   07/29/19 1359  Weight: 140 lb (63.5 kg)    Physical Exam Constitutional:      Appearance: Normal appearance.  HENT:     Head: Normocephalic.      Right Ear: External ear normal.     Left Ear: External ear normal.  Eyes:     Conjunctiva/sclera: Conjunctivae normal.  Neck:     Musculoskeletal: Normal range of motion.  Cardiovascular:     Rate and Rhythm: Normal rate and regular rhythm.     Pulses: Normal pulses.  Heart sounds: Normal heart sounds.  Pulmonary:     Effort: Pulmonary effort is normal.  Abdominal:     General: Bowel sounds are normal.  Musculoskeletal:     Comments: Decreased range of motion  Skin:    General: Skin is warm.  Neurological:     General: No focal deficit present.     Mental Status: She is alert and oriented to person, place, and time.  Psychiatric:        Mood and Affect: Mood normal.      LABORATORY DATA:  I have reviewed the labs as listed.  CBC    Component Value Date/Time   WBC 11.8 (H) 07/11/2019 1250   RBC 4.08 07/11/2019 1250   HGB 13.0 07/11/2019 1250   HCT 40.2 07/11/2019 1250   PLT 253 07/11/2019 1250   MCV 98.5 07/11/2019 1250   MCH 31.9 07/11/2019 1250   MCHC 32.3 07/11/2019 1250   RDW 14.5 07/11/2019 1250   LYMPHSABS 2.2 07/11/2019 1250   MONOABS 0.9 07/11/2019 1250   EOSABS 0.2 07/11/2019 1250   BASOSABS 0.1 07/11/2019 1250   CMP Latest Ref Rng & Units 07/11/2019 04/10/2019 11/12/2015  Glucose 70 - 99 mg/dL 171(H) 160(H) 157(H)  BUN 8 - 23 mg/dL 38(H) 24(H) 23(H)  Creatinine 0.44 - 1.00 mg/dL 1.60(H) 1.35(H) 1.53(H)  Sodium 135 - 145 mmol/L 143 140 139  Potassium 3.5 - 5.1 mmol/L 4.2 4.2 4.2  Chloride 98 - 111 mmol/L 110 108 105  CO2 22 - 32 mmol/L 23 23 25   Calcium 8.9 - 10.3 mg/dL 9.9 9.0 9.6  Total Protein 6.5 - 8.1 g/dL 7.7 7.1 7.3  Total Bilirubin 0.3 - 1.2 mg/dL 1.0 0.7 0.8  Alkaline Phos 38 - 126 U/L 81 67 63  AST 15 - 41 U/L 24 18 27   ALT 0 - 44 U/L 18 13 17         ASSESSMENT & PLAN:   Leukocytosis 1. Leukocytosis -Most likely reactive. -CBC from April 10, 2019 revealed a white blood cell count of 10.8, neutrophil count of 8.3. -CBC from  05/27/2019 revealed a white blood cell count of 12.2, neutrophil count of 8.9 and monocytes were elevated at 1.6. -No prior history of leukocytosis.   -We discussed differential diagnosis of leukocytosis including underlying viral infection, inflammation, autoimmune disorders, or bone marrow disorder. -Lab work-up was negative for underlying etiology of leukocytosis.  Jak 2 -, rheumatoid factor negative ANA negative, BCR ABL negative.  Peripheral smear consistent with mild leukocytosis with neutrophilia.  LDH and sed rate within normal.  Patient has no B symptoms. -At this time we will just continue to monitor.  No intervention is indicated at this time.   2.  Chronic kidney disease -Patient currently has stage III kidney disease she does follow-up with nephrology regularly.  3.  Family history -Mother breast cancer, [place] [phone]

## 2019-08-07 DIAGNOSIS — E538 Deficiency of other specified B group vitamins: Secondary | ICD-10-CM | POA: Diagnosis not present

## 2019-08-17 DIAGNOSIS — R531 Weakness: Secondary | ICD-10-CM | POA: Diagnosis not present

## 2019-08-17 DIAGNOSIS — J449 Chronic obstructive pulmonary disease, unspecified: Secondary | ICD-10-CM | POA: Diagnosis not present

## 2019-08-17 DIAGNOSIS — M1991 Primary osteoarthritis, unspecified site: Secondary | ICD-10-CM | POA: Diagnosis not present

## 2019-08-17 DIAGNOSIS — M150 Primary generalized (osteo)arthritis: Secondary | ICD-10-CM | POA: Diagnosis not present

## 2019-09-01 ENCOUNTER — Ambulatory Visit: Payer: PPO | Attending: Internal Medicine

## 2019-09-01 ENCOUNTER — Other Ambulatory Visit: Payer: Self-pay

## 2019-09-01 DIAGNOSIS — Z20822 Contact with and (suspected) exposure to covid-19: Secondary | ICD-10-CM

## 2019-09-02 LAB — NOVEL CORONAVIRUS, NAA: SARS-CoV-2, NAA: NOT DETECTED

## 2019-09-03 DIAGNOSIS — E538 Deficiency of other specified B group vitamins: Secondary | ICD-10-CM | POA: Diagnosis not present

## 2019-09-12 DIAGNOSIS — R809 Proteinuria, unspecified: Secondary | ICD-10-CM | POA: Diagnosis not present

## 2019-09-12 DIAGNOSIS — E1122 Type 2 diabetes mellitus with diabetic chronic kidney disease: Secondary | ICD-10-CM | POA: Diagnosis not present

## 2019-09-12 DIAGNOSIS — N1832 Chronic kidney disease, stage 3b: Secondary | ICD-10-CM | POA: Diagnosis not present

## 2019-09-12 DIAGNOSIS — Z79899 Other long term (current) drug therapy: Secondary | ICD-10-CM | POA: Diagnosis not present

## 2019-09-12 DIAGNOSIS — N390 Urinary tract infection, site not specified: Secondary | ICD-10-CM | POA: Diagnosis not present

## 2019-09-12 DIAGNOSIS — N189 Chronic kidney disease, unspecified: Secondary | ICD-10-CM | POA: Diagnosis not present

## 2019-09-12 DIAGNOSIS — E1129 Type 2 diabetes mellitus with other diabetic kidney complication: Secondary | ICD-10-CM | POA: Diagnosis not present

## 2019-09-12 DIAGNOSIS — I129 Hypertensive chronic kidney disease with stage 1 through stage 4 chronic kidney disease, or unspecified chronic kidney disease: Secondary | ICD-10-CM | POA: Diagnosis not present

## 2019-09-17 DIAGNOSIS — J449 Chronic obstructive pulmonary disease, unspecified: Secondary | ICD-10-CM | POA: Diagnosis not present

## 2019-09-17 DIAGNOSIS — M1991 Primary osteoarthritis, unspecified site: Secondary | ICD-10-CM | POA: Diagnosis not present

## 2019-09-17 DIAGNOSIS — M150 Primary generalized (osteo)arthritis: Secondary | ICD-10-CM | POA: Diagnosis not present

## 2019-09-17 DIAGNOSIS — R531 Weakness: Secondary | ICD-10-CM | POA: Diagnosis not present

## 2019-09-18 ENCOUNTER — Other Ambulatory Visit: Payer: Self-pay | Admitting: Nephrology

## 2019-09-18 ENCOUNTER — Other Ambulatory Visit (HOSPITAL_COMMUNITY): Payer: Self-pay | Admitting: Nephrology

## 2019-09-18 DIAGNOSIS — R809 Proteinuria, unspecified: Secondary | ICD-10-CM | POA: Diagnosis not present

## 2019-09-18 DIAGNOSIS — N189 Chronic kidney disease, unspecified: Secondary | ICD-10-CM

## 2019-09-18 DIAGNOSIS — N390 Urinary tract infection, site not specified: Secondary | ICD-10-CM | POA: Diagnosis not present

## 2019-09-18 DIAGNOSIS — I129 Hypertensive chronic kidney disease with stage 1 through stage 4 chronic kidney disease, or unspecified chronic kidney disease: Secondary | ICD-10-CM | POA: Diagnosis not present

## 2019-09-18 DIAGNOSIS — E1122 Type 2 diabetes mellitus with diabetic chronic kidney disease: Secondary | ICD-10-CM | POA: Diagnosis not present

## 2019-09-18 DIAGNOSIS — E211 Secondary hyperparathyroidism, not elsewhere classified: Secondary | ICD-10-CM | POA: Diagnosis not present

## 2019-09-18 DIAGNOSIS — N170 Acute kidney failure with tubular necrosis: Secondary | ICD-10-CM | POA: Diagnosis not present

## 2019-09-18 DIAGNOSIS — E1129 Type 2 diabetes mellitus with other diabetic kidney complication: Secondary | ICD-10-CM | POA: Diagnosis not present

## 2019-09-22 ENCOUNTER — Ambulatory Visit (HOSPITAL_COMMUNITY)
Admission: RE | Admit: 2019-09-22 | Discharge: 2019-09-22 | Disposition: A | Discharge status: Home / Self Care | Payer: PPO | Source: Ambulatory Visit | Attending: Nephrology | Admitting: Nephrology

## 2019-09-22 ENCOUNTER — Other Ambulatory Visit: Payer: Self-pay

## 2019-09-22 DIAGNOSIS — N200 Calculus of kidney: Secondary | ICD-10-CM | POA: Diagnosis not present

## 2019-09-22 DIAGNOSIS — N1330 Unspecified hydronephrosis: Secondary | ICD-10-CM | POA: Diagnosis not present

## 2019-09-22 DIAGNOSIS — N189 Chronic kidney disease, unspecified: Secondary | ICD-10-CM | POA: Diagnosis not present

## 2019-09-24 DIAGNOSIS — N390 Urinary tract infection, site not specified: Secondary | ICD-10-CM | POA: Diagnosis not present

## 2019-09-24 DIAGNOSIS — N189 Chronic kidney disease, unspecified: Secondary | ICD-10-CM | POA: Diagnosis not present

## 2019-09-24 DIAGNOSIS — E211 Secondary hyperparathyroidism, not elsewhere classified: Secondary | ICD-10-CM | POA: Diagnosis not present

## 2019-09-24 DIAGNOSIS — R809 Proteinuria, unspecified: Secondary | ICD-10-CM | POA: Diagnosis not present

## 2019-09-24 DIAGNOSIS — E1122 Type 2 diabetes mellitus with diabetic chronic kidney disease: Secondary | ICD-10-CM | POA: Diagnosis not present

## 2019-09-24 DIAGNOSIS — N170 Acute kidney failure with tubular necrosis: Secondary | ICD-10-CM | POA: Diagnosis not present

## 2019-09-24 DIAGNOSIS — E1129 Type 2 diabetes mellitus with other diabetic kidney complication: Secondary | ICD-10-CM | POA: Diagnosis not present

## 2019-10-02 DIAGNOSIS — E1122 Type 2 diabetes mellitus with diabetic chronic kidney disease: Secondary | ICD-10-CM | POA: Diagnosis not present

## 2019-10-02 DIAGNOSIS — N170 Acute kidney failure with tubular necrosis: Secondary | ICD-10-CM | POA: Diagnosis not present

## 2019-10-02 DIAGNOSIS — E1129 Type 2 diabetes mellitus with other diabetic kidney complication: Secondary | ICD-10-CM | POA: Diagnosis not present

## 2019-10-02 DIAGNOSIS — N390 Urinary tract infection, site not specified: Secondary | ICD-10-CM | POA: Diagnosis not present

## 2019-10-02 DIAGNOSIS — E211 Secondary hyperparathyroidism, not elsewhere classified: Secondary | ICD-10-CM | POA: Diagnosis not present

## 2019-10-02 DIAGNOSIS — N189 Chronic kidney disease, unspecified: Secondary | ICD-10-CM | POA: Diagnosis not present

## 2019-10-02 DIAGNOSIS — R809 Proteinuria, unspecified: Secondary | ICD-10-CM | POA: Diagnosis not present

## 2019-10-09 DIAGNOSIS — N170 Acute kidney failure with tubular necrosis: Secondary | ICD-10-CM | POA: Diagnosis not present

## 2019-10-09 DIAGNOSIS — E538 Deficiency of other specified B group vitamins: Secondary | ICD-10-CM | POA: Diagnosis not present

## 2019-10-13 DIAGNOSIS — I129 Hypertensive chronic kidney disease with stage 1 through stage 4 chronic kidney disease, or unspecified chronic kidney disease: Secondary | ICD-10-CM | POA: Diagnosis not present

## 2019-10-13 DIAGNOSIS — Z79899 Other long term (current) drug therapy: Secondary | ICD-10-CM | POA: Diagnosis not present

## 2019-10-13 DIAGNOSIS — E872 Acidosis: Secondary | ICD-10-CM | POA: Diagnosis not present

## 2019-10-13 DIAGNOSIS — E1122 Type 2 diabetes mellitus with diabetic chronic kidney disease: Secondary | ICD-10-CM | POA: Diagnosis not present

## 2019-10-13 DIAGNOSIS — E211 Secondary hyperparathyroidism, not elsewhere classified: Secondary | ICD-10-CM | POA: Diagnosis not present

## 2019-10-13 DIAGNOSIS — R809 Proteinuria, unspecified: Secondary | ICD-10-CM | POA: Diagnosis not present

## 2019-10-13 DIAGNOSIS — N170 Acute kidney failure with tubular necrosis: Secondary | ICD-10-CM | POA: Diagnosis not present

## 2019-10-13 DIAGNOSIS — E1129 Type 2 diabetes mellitus with other diabetic kidney complication: Secondary | ICD-10-CM | POA: Diagnosis not present

## 2019-10-13 DIAGNOSIS — N189 Chronic kidney disease, unspecified: Secondary | ICD-10-CM | POA: Diagnosis not present

## 2019-10-13 DIAGNOSIS — E875 Hyperkalemia: Secondary | ICD-10-CM | POA: Diagnosis not present

## 2019-10-18 DIAGNOSIS — R531 Weakness: Secondary | ICD-10-CM | POA: Diagnosis not present

## 2019-10-18 DIAGNOSIS — M150 Primary generalized (osteo)arthritis: Secondary | ICD-10-CM | POA: Diagnosis not present

## 2019-10-18 DIAGNOSIS — J449 Chronic obstructive pulmonary disease, unspecified: Secondary | ICD-10-CM | POA: Diagnosis not present

## 2019-10-18 DIAGNOSIS — M1991 Primary osteoarthritis, unspecified site: Secondary | ICD-10-CM | POA: Diagnosis not present

## 2019-11-15 DIAGNOSIS — M1991 Primary osteoarthritis, unspecified site: Secondary | ICD-10-CM | POA: Diagnosis not present

## 2019-11-15 DIAGNOSIS — R531 Weakness: Secondary | ICD-10-CM | POA: Diagnosis not present

## 2019-11-15 DIAGNOSIS — J449 Chronic obstructive pulmonary disease, unspecified: Secondary | ICD-10-CM | POA: Diagnosis not present

## 2019-11-15 DIAGNOSIS — M150 Primary generalized (osteo)arthritis: Secondary | ICD-10-CM | POA: Diagnosis not present

## 2019-11-19 DIAGNOSIS — E538 Deficiency of other specified B group vitamins: Secondary | ICD-10-CM | POA: Diagnosis not present

## 2019-11-19 DIAGNOSIS — E1142 Type 2 diabetes mellitus with diabetic polyneuropathy: Secondary | ICD-10-CM | POA: Diagnosis not present

## 2019-11-19 DIAGNOSIS — J329 Chronic sinusitis, unspecified: Secondary | ICD-10-CM | POA: Diagnosis not present

## 2019-11-19 DIAGNOSIS — I700 Atherosclerosis of aorta: Secondary | ICD-10-CM | POA: Diagnosis not present

## 2019-11-19 DIAGNOSIS — I10 Essential (primary) hypertension: Secondary | ICD-10-CM | POA: Diagnosis not present

## 2019-11-19 DIAGNOSIS — Z681 Body mass index (BMI) 19 or less, adult: Secondary | ICD-10-CM | POA: Diagnosis not present

## 2019-12-02 ENCOUNTER — Inpatient Hospital Stay (HOSPITAL_COMMUNITY): Payer: PPO

## 2019-12-09 ENCOUNTER — Ambulatory Visit (HOSPITAL_COMMUNITY): Payer: PPO | Admitting: Hematology

## 2019-12-16 DIAGNOSIS — M150 Primary generalized (osteo)arthritis: Secondary | ICD-10-CM | POA: Diagnosis not present

## 2019-12-16 DIAGNOSIS — R531 Weakness: Secondary | ICD-10-CM | POA: Diagnosis not present

## 2019-12-16 DIAGNOSIS — M1991 Primary osteoarthritis, unspecified site: Secondary | ICD-10-CM | POA: Diagnosis not present

## 2019-12-16 DIAGNOSIS — J449 Chronic obstructive pulmonary disease, unspecified: Secondary | ICD-10-CM | POA: Diagnosis not present

## 2019-12-18 DIAGNOSIS — I129 Hypertensive chronic kidney disease with stage 1 through stage 4 chronic kidney disease, or unspecified chronic kidney disease: Secondary | ICD-10-CM | POA: Diagnosis not present

## 2019-12-18 DIAGNOSIS — E1122 Type 2 diabetes mellitus with diabetic chronic kidney disease: Secondary | ICD-10-CM | POA: Diagnosis not present

## 2019-12-18 DIAGNOSIS — N170 Acute kidney failure with tubular necrosis: Secondary | ICD-10-CM | POA: Diagnosis not present

## 2019-12-18 DIAGNOSIS — E1129 Type 2 diabetes mellitus with other diabetic kidney complication: Secondary | ICD-10-CM | POA: Diagnosis not present

## 2019-12-18 DIAGNOSIS — R809 Proteinuria, unspecified: Secondary | ICD-10-CM | POA: Diagnosis not present

## 2019-12-18 DIAGNOSIS — E211 Secondary hyperparathyroidism, not elsewhere classified: Secondary | ICD-10-CM | POA: Diagnosis not present

## 2019-12-18 DIAGNOSIS — Z79899 Other long term (current) drug therapy: Secondary | ICD-10-CM | POA: Diagnosis not present

## 2019-12-18 DIAGNOSIS — N189 Chronic kidney disease, unspecified: Secondary | ICD-10-CM | POA: Diagnosis not present

## 2019-12-22 ENCOUNTER — Other Ambulatory Visit (HOSPITAL_COMMUNITY): Payer: Self-pay | Admitting: *Deleted

## 2019-12-22 DIAGNOSIS — D72829 Elevated white blood cell count, unspecified: Secondary | ICD-10-CM

## 2019-12-23 ENCOUNTER — Other Ambulatory Visit: Payer: Self-pay

## 2019-12-23 ENCOUNTER — Inpatient Hospital Stay (HOSPITAL_COMMUNITY): Payer: PPO | Attending: Hematology

## 2019-12-23 DIAGNOSIS — D72829 Elevated white blood cell count, unspecified: Secondary | ICD-10-CM | POA: Diagnosis not present

## 2019-12-23 DIAGNOSIS — N189 Chronic kidney disease, unspecified: Secondary | ICD-10-CM | POA: Diagnosis not present

## 2019-12-23 LAB — CBC WITH DIFFERENTIAL/PLATELET
Abs Immature Granulocytes: 0.09 10*3/uL — ABNORMAL HIGH (ref 0.00–0.07)
Basophils Absolute: 0 10*3/uL (ref 0.0–0.1)
Basophils Relative: 0 %
Eosinophils Absolute: 0.1 10*3/uL (ref 0.0–0.5)
Eosinophils Relative: 1 %
HCT: 38.5 % (ref 36.0–46.0)
Hemoglobin: 12.2 g/dL (ref 12.0–15.0)
Immature Granulocytes: 1 %
Lymphocytes Relative: 15 %
Lymphs Abs: 1.9 10*3/uL (ref 0.7–4.0)
MCH: 31.9 pg (ref 26.0–34.0)
MCHC: 31.7 g/dL (ref 30.0–36.0)
MCV: 100.8 fL — ABNORMAL HIGH (ref 80.0–100.0)
Monocytes Absolute: 1 10*3/uL (ref 0.1–1.0)
Monocytes Relative: 8 %
Neutro Abs: 9.4 10*3/uL — ABNORMAL HIGH (ref 1.7–7.7)
Neutrophils Relative %: 75 %
Platelets: 221 10*3/uL (ref 150–400)
RBC: 3.82 MIL/uL — ABNORMAL LOW (ref 3.87–5.11)
RDW: 15 % (ref 11.5–15.5)
WBC: 12.6 10*3/uL — ABNORMAL HIGH (ref 4.0–10.5)
nRBC: 0 % (ref 0.0–0.2)

## 2019-12-23 LAB — COMPREHENSIVE METABOLIC PANEL
ALT: 20 U/L (ref 0–44)
AST: 21 U/L (ref 15–41)
Albumin: 3.8 g/dL (ref 3.5–5.0)
Alkaline Phosphatase: 62 U/L (ref 38–126)
Anion gap: 10 (ref 5–15)
BUN: 49 mg/dL — ABNORMAL HIGH (ref 8–23)
CO2: 23 mmol/L (ref 22–32)
Calcium: 9.3 mg/dL (ref 8.9–10.3)
Chloride: 108 mmol/L (ref 98–111)
Creatinine, Ser: 2.03 mg/dL — ABNORMAL HIGH (ref 0.44–1.00)
GFR calc Af Amer: 26 mL/min — ABNORMAL LOW (ref >60)
GFR calc non Af Amer: 23 mL/min — ABNORMAL LOW (ref >60)
Glucose, Bld: 292 mg/dL — ABNORMAL HIGH (ref 70–99)
Potassium: 5.4 mmol/L — ABNORMAL HIGH (ref 3.5–5.1)
Sodium: 141 mmol/L (ref 135–145)
Total Bilirubin: 0.8 mg/dL (ref 0.3–1.2)
Total Protein: 6.4 g/dL — ABNORMAL LOW (ref 6.5–8.1)

## 2019-12-23 LAB — LACTATE DEHYDROGENASE: LDH: 140 U/L (ref 98–192)

## 2019-12-25 DIAGNOSIS — E1129 Type 2 diabetes mellitus with other diabetic kidney complication: Secondary | ICD-10-CM | POA: Diagnosis not present

## 2019-12-25 DIAGNOSIS — E872 Acidosis: Secondary | ICD-10-CM | POA: Diagnosis not present

## 2019-12-25 DIAGNOSIS — R809 Proteinuria, unspecified: Secondary | ICD-10-CM | POA: Diagnosis not present

## 2019-12-25 DIAGNOSIS — E875 Hyperkalemia: Secondary | ICD-10-CM | POA: Diagnosis not present

## 2019-12-25 DIAGNOSIS — E1122 Type 2 diabetes mellitus with diabetic chronic kidney disease: Secondary | ICD-10-CM | POA: Diagnosis not present

## 2019-12-25 DIAGNOSIS — I129 Hypertensive chronic kidney disease with stage 1 through stage 4 chronic kidney disease, or unspecified chronic kidney disease: Secondary | ICD-10-CM | POA: Diagnosis not present

## 2019-12-25 DIAGNOSIS — N189 Chronic kidney disease, unspecified: Secondary | ICD-10-CM | POA: Diagnosis not present

## 2019-12-25 DIAGNOSIS — E211 Secondary hyperparathyroidism, not elsewhere classified: Secondary | ICD-10-CM | POA: Diagnosis not present

## 2019-12-30 ENCOUNTER — Encounter (HOSPITAL_COMMUNITY): Payer: Self-pay | Admitting: Hematology

## 2019-12-30 ENCOUNTER — Inpatient Hospital Stay (HOSPITAL_COMMUNITY): Payer: PPO | Attending: Hematology | Admitting: Hematology

## 2019-12-30 ENCOUNTER — Other Ambulatory Visit: Payer: Self-pay

## 2019-12-30 VITALS — BP 121/64 | HR 91 | Temp 97.1°F | Resp 16 | Wt 138.6 lb

## 2019-12-30 DIAGNOSIS — J449 Chronic obstructive pulmonary disease, unspecified: Secondary | ICD-10-CM | POA: Insufficient documentation

## 2019-12-30 DIAGNOSIS — Z79899 Other long term (current) drug therapy: Secondary | ICD-10-CM | POA: Insufficient documentation

## 2019-12-30 DIAGNOSIS — K589 Irritable bowel syndrome without diarrhea: Secondary | ICD-10-CM | POA: Diagnosis not present

## 2019-12-30 DIAGNOSIS — Z7984 Long term (current) use of oral hypoglycemic drugs: Secondary | ICD-10-CM | POA: Diagnosis not present

## 2019-12-30 DIAGNOSIS — Z833 Family history of diabetes mellitus: Secondary | ICD-10-CM | POA: Diagnosis not present

## 2019-12-30 DIAGNOSIS — Z7951 Long term (current) use of inhaled steroids: Secondary | ICD-10-CM | POA: Insufficient documentation

## 2019-12-30 DIAGNOSIS — Z8349 Family history of other endocrine, nutritional and metabolic diseases: Secondary | ICD-10-CM | POA: Insufficient documentation

## 2019-12-30 DIAGNOSIS — N189 Chronic kidney disease, unspecified: Secondary | ICD-10-CM | POA: Insufficient documentation

## 2019-12-30 DIAGNOSIS — Z8249 Family history of ischemic heart disease and other diseases of the circulatory system: Secondary | ICD-10-CM | POA: Diagnosis not present

## 2019-12-30 DIAGNOSIS — Z803 Family history of malignant neoplasm of breast: Secondary | ICD-10-CM | POA: Insufficient documentation

## 2019-12-30 DIAGNOSIS — D72829 Elevated white blood cell count, unspecified: Secondary | ICD-10-CM

## 2019-12-30 DIAGNOSIS — E119 Type 2 diabetes mellitus without complications: Secondary | ICD-10-CM | POA: Insufficient documentation

## 2019-12-30 DIAGNOSIS — I10 Essential (primary) hypertension: Secondary | ICD-10-CM | POA: Diagnosis not present

## 2019-12-30 DIAGNOSIS — Z90710 Acquired absence of both cervix and uterus: Secondary | ICD-10-CM | POA: Insufficient documentation

## 2019-12-30 NOTE — Progress Notes (Signed)
[place], [place] [address]   CLINIC:  Medical Oncology/Hematology  PCP:  [facility], [place] [address] [phone]   REASON FOR VISIT:  Follow-up for leukocytosis  CURRENT THERAPY: Clinical surveillance   INTERVAL HISTORY:  Lindsay Cortez 79 y.o. female seen for follow-up of leukocytosis.  Appetite and energy levels are 75%.  Has chronic diarrhea from IBS which is stable.  No fevers, night sweats or weight loss reported.  No recurrent infections or hospitalizations.   REVIEW OF SYSTEMS:  Review of Systems  Gastrointestinal: Positive for diarrhea.  All other systems reviewed and are negative.    PAST MEDICAL/SURGICAL HISTORY:  Past Medical History:  Diagnosis Date  . Arthritis   . Asthma   . Back pain   . COPD (chronic obstructive pulmonary disease) ([place])   . Diabetes ([place]) 09/01/2014  . Diabetes mellitus    x 15 yrs  . Gout   . Hypertension   . Kidney stones   . Pancreatic cyst   . Pneumonia   . Renal disorder   . Shoulder dislocation   . Vertigo    Past Surgical History:  Procedure Laterality Date  . ABDOMINAL HYSTERECTOMY    . BLADDER SURGERY     bladder tac  . BREAST SURGERY     benign  . FOOT SURGERY       SOCIAL HISTORY:  Social History   Socioeconomic History  . Marital status: Widowed    Spouse name: Not on file  . Number of children: 2  . Years of education: Not on file  . Highest education level: Not on file  Occupational History    Employer: RETIRED  Tobacco Use  . Smoking status: Never Smoker  . Smokeless tobacco: Never Used  Substance and Sexual Activity  . Alcohol use: No    Alcohol/week: 0.0 standard drinks  . Drug use: No  . Sexual activity: Not on file  Other Topics Concern  . Not on file  Social History Narrative  . Not on file   Social Determinants of Health   Financial Resource Strain:   . Difficulty of Paying Living Expenses:   Food Insecurity:    . Worried About Charity fundraiser in the Last Year:   . Arboriculturist in the Last Year:   Transportation Needs:   . Film/video editor (Medical):   Marland Kitchen Lack of Transportation (Non-Medical):   Physical Activity:   . Days of Exercise per Week:   . Minutes of Exercise per Session:   Stress:   . Feeling of Stress :   Social Connections:   . Frequency of Communication with Friends and Family:   . Frequency of Social Gatherings with Friends and Family:   . Attends Religious Services:   . Active Member of Clubs or Organizations:   . Attends Archivist Meetings:   Marland Kitchen Marital Status:   Intimate Partner Violence:   . Fear of Current or Ex-Partner:   . Emotionally Abused:   Marland Kitchen Physically Abused:   . Sexually Abused:     FAMILY HISTORY:  Family History  Problem Relation Age of Onset  . Breast cancer Mother   . Emphysema Father   . COPD Sister   . Atrial fibrillation Sister   . Obesity Brother   . Cirrhosis Daughter   . COPD Son   . Diabetes Brother   . Asthma Brother     CURRENT  MEDICATIONS:  Outpatient Encounter Medications as of 12/30/2019  Medication Sig  . albuterol (PROVENTIL) (2.5 MG/3ML) 0.083% nebulizer solution Take 3 mLs (2.5 mg total) by nebulization every 4 (four) hours as needed for wheezing.  Marland Kitchen albuterol (VENTOLIN HFA) 108 (90 BASE) MCG/ACT inhaler Inhale 2 puffs into the lungs every 4 (four) hours as needed for wheezing or shortness of breath. For rescue  . metoprolol succinate (TOPROL-XL) 50 MG 24 hr tablet Take 50 mg by mouth daily.  . promethazine (PHENERGAN) 25 MG suppository Place 1 suppository (25 mg total) rectally every 6 (six) hours as needed for nausea or vomiting.  Marland Kitchen acetaminophen (TYLENOL) 500 MG tablet Take 500 mg by mouth daily as needed (pain). For pain  . allopurinol (ZYLOPRIM) 100 MG tablet Take 200 mg by mouth daily.  Marland Kitchen amLODipine (NORVASC) 5 MG tablet Take 5 mg by mouth daily.  . budesonide-formoterol (SYMBICORT) 80-4.5 MCG/ACT  inhaler Inhale 2 puffs into the lungs 2 (two) times daily.   . Calcium Carbonate-Vitamin D (CALCIUM 600 + D PO) Take 1 tablet by mouth daily.    . Cholecalciferol (VITAMIN D) 2000 UNITS CAPS Take 1 capsule by mouth every morning.    Marland Kitchen glimepiride (AMARYL) 2 MG tablet Take 2-4 mg by mouth 2 (two) times daily. 2 in the morning and 1 at bedtime  . levothyroxine (SYNTHROID) 75 MCG tablet Take 75 mcg by mouth daily.  Marland Kitchen loperamide (IMODIUM) 2 MG capsule Take 2 mg by mouth every morning.    . meclizine (ANTIVERT) 25 MG tablet Take 1 tablet (25 mg total) by mouth 3 (three) times daily as needed for dizziness. (Patient not taking: Reported on 07/29/2019)  . simvastatin (ZOCOR) 10 MG tablet Take 10 mg by mouth at bedtime.    . [DISCONTINUED] albuterol (VENTOLIN) 2 MG/5ML syrup Take 2 mg by mouth 3 (three) times daily.   No facility-administered encounter medications on file as of 12/30/2019.    ALLERGIES:  Allergies  Allergen Reactions  . Fentanyl Nausea And Vomiting  . Codeine     Narcotic pain medicine makes her nauseated.  Says was told to only take tylenol due to kidney disease.  Mack Hook [Levofloxacin In D5w] Nausea And Vomiting     PHYSICAL EXAM:  ECOG Performance status: 1  Vitals:   12/30/19 1417  BP: 121/64  Pulse: 91  Resp: 16  Temp: (!) 97.1 F (36.2 C)  SpO2: 97%   Filed Weights   12/30/19 1417  Weight: 138 lb 9.6 oz (62.9 kg)    Physical Exam Constitutional:      Appearance: Normal appearance.  Cardiovascular:     Rate and Rhythm: Normal rate and regular rhythm.     Heart sounds: Normal heart sounds.  Pulmonary:     Effort: Pulmonary effort is normal.     Breath sounds: Normal breath sounds.  Abdominal:     Palpations: Abdomen is soft.  Musculoskeletal:     Cervical back: Normal range of motion.  Skin:    General: Skin is warm.  Neurological:     General: No focal deficit present.     Mental Status: She is alert and oriented to person, place, and time.    Psychiatric:        Mood and Affect: Mood normal.      LABORATORY DATA:  I have reviewed the labs as listed.  CBC    Component Value Date/Time   WBC 12.6 (H) 12/23/2019 1319   RBC 3.82 (L) 12/23/2019 1319  HGB 12.2 12/23/2019 1319   HCT 38.5 12/23/2019 1319   PLT 221 12/23/2019 1319   MCV 100.8 (H) 12/23/2019 1319   MCH 31.9 12/23/2019 1319   MCHC 31.7 12/23/2019 1319   RDW 15.0 12/23/2019 1319   LYMPHSABS 1.9 12/23/2019 1319   MONOABS 1.0 12/23/2019 1319   EOSABS 0.1 12/23/2019 1319   BASOSABS 0.0 12/23/2019 1319   CMP Latest Ref Rng & Units 12/23/2019 07/11/2019 04/10/2019  Glucose 70 - 99 mg/dL 292(H) 171(H) 160(H)  BUN 8 - 23 mg/dL 49(H) 38(H) 24(H)  Creatinine 0.44 - 1.00 mg/dL 2.03(H) 1.60(H) 1.35(H)  Sodium 135 - 145 mmol/L 141 143 140  Potassium 3.5 - 5.1 mmol/L 5.4(H) 4.2 4.2  Chloride 98 - 111 mmol/L 108 110 108  CO2 22 - 32 mmol/L 23 23 23   Calcium 8.9 - 10.3 mg/dL 9.3 9.9 9.0  Total Protein 6.5 - 8.1 g/dL 6.4(L) 7.7 7.1  Total Bilirubin 0.3 - 1.2 mg/dL 0.8 1.0 0.7  Alkaline Phos 38 - 126 U/L 62 81 67  AST 15 - 41 U/L 21 24 18   ALT 0 - 44 U/L 20 18 13     I have reviewed her scans.   ASSESSMENT & PLAN:   Leukocytosis 1.  JAK2 and BCR/ABL negative leukocytosis: -She was worked up for mild leukocytosis, predominantly neutrophilic.  She never had any B symptoms.  No recurrent infections or chronic systemic steroid use. -Previous work-up was negative for myeloproliferative disorders. -We reviewed CBC from 12/23/2019.  White count is 12.6 with 75% neutrophils and 15% lymphocytes.  Hemoglobin and platelet count was normal.  LDH was normal. -We will see her back in 6 months for follow-up with repeat labs.  She was told to come back sooner should she develop any B symptoms.  2.  CKD: -Creatinine is ranging between 1.6-2.0. -She follows up with her nephrologist Dr. Theador Hawthorne. -Ultrasound of the kidneys on 09/22/2019 shows mild left hydronephrosis, stable.         [place] [phone]

## 2019-12-30 NOTE — Patient Instructions (Addendum)
[facility] Discharge Instructions  You were seen today by Dr. Katragadda. He went over your recent lab results. He will see you back in 6 months for labs and follow up.   Thank you for choosing [facility] at [facility] to provide your oncology and hematology care.  To afford each patient quality time with our provider, please arrive at least 15 minutes before your scheduled appointment time.   If you have a lab appointment with the Cancer Center please come in thru the  Main Entrance and check in at the main information desk  You need to re-schedule your appointment should you arrive 10 or more minutes late.  We strive to give you quality time with our providers, and arriving late affects you and other patients whose appointments are after yours.  Also, if you no show three or more times for appointments you may be dismissed from the clinic at the providers discretion.     Again, thank you for choosing [facility].  Our hope is that these requests will decrease the amount of time that you wait before being seen by our physicians.       _____________________________________________________________  Should you have questions after your visit to [facility], please contact our office at [phone] between the hours of 8:00 a.m. and 4:30 p.m.  Voicemails left after 4:00 p.m. will not be returned until the following business day.  For prescription refill requests, have your pharmacy contact our office and allow 72 hours.    Cancer Center Support Programs:   > Cancer Support Group  2nd Tuesday of the month 1pm-2pm, Journey Room    

## 2019-12-30 NOTE — Assessment & Plan Note (Signed)
1.  JAK2 and BCR/ABL negative leukocytosis: -She was worked up for mild leukocytosis, predominantly neutrophilic.  She never had any B symptoms.  No recurrent infections or chronic systemic steroid use. -Previous work-up was negative for myeloproliferative disorders. -We reviewed CBC from 12/23/2019.  White count is 12.6 with 75% neutrophils and 15% lymphocytes.  Hemoglobin and platelet count was normal.  LDH was normal. -We will see her back in 6 months for follow-up with repeat labs.  She was told to come back sooner should she develop any B symptoms.  2.  CKD: -Creatinine is ranging between 1.6-2.0. -She follows up with her nephrologist Dr. Theador Hawthorne. -Ultrasound of the kidneys on 09/22/2019 shows mild left hydronephrosis, stable.

## 2020-01-01 DIAGNOSIS — R809 Proteinuria, unspecified: Secondary | ICD-10-CM | POA: Diagnosis not present

## 2020-01-01 DIAGNOSIS — E1122 Type 2 diabetes mellitus with diabetic chronic kidney disease: Secondary | ICD-10-CM | POA: Diagnosis not present

## 2020-01-01 DIAGNOSIS — E1129 Type 2 diabetes mellitus with other diabetic kidney complication: Secondary | ICD-10-CM | POA: Diagnosis not present

## 2020-01-01 DIAGNOSIS — I129 Hypertensive chronic kidney disease with stage 1 through stage 4 chronic kidney disease, or unspecified chronic kidney disease: Secondary | ICD-10-CM | POA: Diagnosis not present

## 2020-01-01 DIAGNOSIS — E872 Acidosis: Secondary | ICD-10-CM | POA: Diagnosis not present

## 2020-01-01 DIAGNOSIS — N189 Chronic kidney disease, unspecified: Secondary | ICD-10-CM | POA: Diagnosis not present

## 2020-01-01 DIAGNOSIS — E538 Deficiency of other specified B group vitamins: Secondary | ICD-10-CM | POA: Diagnosis not present

## 2020-01-01 DIAGNOSIS — E211 Secondary hyperparathyroidism, not elsewhere classified: Secondary | ICD-10-CM | POA: Diagnosis not present

## 2020-01-01 DIAGNOSIS — E875 Hyperkalemia: Secondary | ICD-10-CM | POA: Diagnosis not present

## 2020-01-15 DIAGNOSIS — M1991 Primary osteoarthritis, unspecified site: Secondary | ICD-10-CM | POA: Diagnosis not present

## 2020-01-15 DIAGNOSIS — J449 Chronic obstructive pulmonary disease, unspecified: Secondary | ICD-10-CM | POA: Diagnosis not present

## 2020-01-15 DIAGNOSIS — R531 Weakness: Secondary | ICD-10-CM | POA: Diagnosis not present

## 2020-01-15 DIAGNOSIS — M150 Primary generalized (osteo)arthritis: Secondary | ICD-10-CM | POA: Diagnosis not present

## 2020-02-15 DIAGNOSIS — J449 Chronic obstructive pulmonary disease, unspecified: Secondary | ICD-10-CM | POA: Diagnosis not present

## 2020-02-15 DIAGNOSIS — M150 Primary generalized (osteo)arthritis: Secondary | ICD-10-CM | POA: Diagnosis not present

## 2020-02-15 DIAGNOSIS — R531 Weakness: Secondary | ICD-10-CM | POA: Diagnosis not present

## 2020-02-15 DIAGNOSIS — M1991 Primary osteoarthritis, unspecified site: Secondary | ICD-10-CM | POA: Diagnosis not present

## 2020-02-20 DIAGNOSIS — R809 Proteinuria, unspecified: Secondary | ICD-10-CM | POA: Diagnosis not present

## 2020-02-20 DIAGNOSIS — I129 Hypertensive chronic kidney disease with stage 1 through stage 4 chronic kidney disease, or unspecified chronic kidney disease: Secondary | ICD-10-CM | POA: Diagnosis not present

## 2020-02-20 DIAGNOSIS — N189 Chronic kidney disease, unspecified: Secondary | ICD-10-CM | POA: Diagnosis not present

## 2020-02-20 DIAGNOSIS — E872 Acidosis: Secondary | ICD-10-CM | POA: Diagnosis not present

## 2020-02-20 DIAGNOSIS — E875 Hyperkalemia: Secondary | ICD-10-CM | POA: Diagnosis not present

## 2020-02-20 DIAGNOSIS — E538 Deficiency of other specified B group vitamins: Secondary | ICD-10-CM | POA: Diagnosis not present

## 2020-02-20 DIAGNOSIS — E1122 Type 2 diabetes mellitus with diabetic chronic kidney disease: Secondary | ICD-10-CM | POA: Diagnosis not present

## 2020-02-20 DIAGNOSIS — E211 Secondary hyperparathyroidism, not elsewhere classified: Secondary | ICD-10-CM | POA: Diagnosis not present

## 2020-02-20 DIAGNOSIS — E1129 Type 2 diabetes mellitus with other diabetic kidney complication: Secondary | ICD-10-CM | POA: Diagnosis not present

## 2020-02-27 DIAGNOSIS — E870 Hyperosmolality and hypernatremia: Secondary | ICD-10-CM | POA: Diagnosis not present

## 2020-02-27 DIAGNOSIS — E211 Secondary hyperparathyroidism, not elsewhere classified: Secondary | ICD-10-CM | POA: Diagnosis not present

## 2020-02-27 DIAGNOSIS — E875 Hyperkalemia: Secondary | ICD-10-CM | POA: Diagnosis not present

## 2020-02-27 DIAGNOSIS — E1122 Type 2 diabetes mellitus with diabetic chronic kidney disease: Secondary | ICD-10-CM | POA: Diagnosis not present

## 2020-02-27 DIAGNOSIS — E872 Acidosis: Secondary | ICD-10-CM | POA: Diagnosis not present

## 2020-02-27 DIAGNOSIS — E1129 Type 2 diabetes mellitus with other diabetic kidney complication: Secondary | ICD-10-CM | POA: Diagnosis not present

## 2020-02-27 DIAGNOSIS — N189 Chronic kidney disease, unspecified: Secondary | ICD-10-CM | POA: Diagnosis not present

## 2020-02-27 DIAGNOSIS — R809 Proteinuria, unspecified: Secondary | ICD-10-CM | POA: Diagnosis not present

## 2020-03-03 DIAGNOSIS — E538 Deficiency of other specified B group vitamins: Secondary | ICD-10-CM | POA: Diagnosis not present

## 2020-03-03 DIAGNOSIS — E7849 Other hyperlipidemia: Secondary | ICD-10-CM | POA: Diagnosis not present

## 2020-03-03 DIAGNOSIS — E663 Overweight: Secondary | ICD-10-CM | POA: Diagnosis not present

## 2020-03-03 DIAGNOSIS — E1140 Type 2 diabetes mellitus with diabetic neuropathy, unspecified: Secondary | ICD-10-CM | POA: Diagnosis not present

## 2020-03-03 DIAGNOSIS — R201 Hypoesthesia of skin: Secondary | ICD-10-CM | POA: Diagnosis not present

## 2020-03-03 DIAGNOSIS — G894 Chronic pain syndrome: Secondary | ICD-10-CM | POA: Diagnosis not present

## 2020-03-03 DIAGNOSIS — J449 Chronic obstructive pulmonary disease, unspecified: Secondary | ICD-10-CM | POA: Diagnosis not present

## 2020-03-03 DIAGNOSIS — Z0000 Encounter for general adult medical examination without abnormal findings: Secondary | ICD-10-CM | POA: Diagnosis not present

## 2020-03-03 DIAGNOSIS — I10 Essential (primary) hypertension: Secondary | ICD-10-CM | POA: Diagnosis not present

## 2020-03-03 DIAGNOSIS — Z1389 Encounter for screening for other disorder: Secondary | ICD-10-CM | POA: Diagnosis not present

## 2020-03-03 DIAGNOSIS — J45909 Unspecified asthma, uncomplicated: Secondary | ICD-10-CM | POA: Diagnosis not present

## 2020-03-03 DIAGNOSIS — K529 Noninfective gastroenteritis and colitis, unspecified: Secondary | ICD-10-CM | POA: Diagnosis not present

## 2020-03-03 DIAGNOSIS — Z6829 Body mass index (BMI) 29.0-29.9, adult: Secondary | ICD-10-CM | POA: Diagnosis not present

## 2020-03-04 ENCOUNTER — Other Ambulatory Visit (HOSPITAL_COMMUNITY): Payer: Self-pay | Admitting: Internal Medicine

## 2020-03-04 DIAGNOSIS — Z1231 Encounter for screening mammogram for malignant neoplasm of breast: Secondary | ICD-10-CM

## 2020-03-08 ENCOUNTER — Other Ambulatory Visit (HOSPITAL_COMMUNITY): Payer: Self-pay | Admitting: Internal Medicine

## 2020-03-08 DIAGNOSIS — Z1211 Encounter for screening for malignant neoplasm of colon: Secondary | ICD-10-CM | POA: Diagnosis not present

## 2020-03-08 DIAGNOSIS — E2839 Other primary ovarian failure: Secondary | ICD-10-CM

## 2020-03-12 ENCOUNTER — Other Ambulatory Visit: Payer: Self-pay

## 2020-03-12 ENCOUNTER — Ambulatory Visit (HOSPITAL_COMMUNITY)
Admission: RE | Admit: 2020-03-12 | Discharge: 2020-03-12 | Disposition: A | Discharge status: Home / Self Care | Payer: PPO | Source: Ambulatory Visit | Attending: Internal Medicine | Admitting: Internal Medicine

## 2020-03-12 DIAGNOSIS — E1122 Type 2 diabetes mellitus with diabetic chronic kidney disease: Secondary | ICD-10-CM | POA: Diagnosis not present

## 2020-03-12 DIAGNOSIS — E870 Hyperosmolality and hypernatremia: Secondary | ICD-10-CM | POA: Diagnosis not present

## 2020-03-12 DIAGNOSIS — N189 Chronic kidney disease, unspecified: Secondary | ICD-10-CM | POA: Diagnosis not present

## 2020-03-12 DIAGNOSIS — Z1231 Encounter for screening mammogram for malignant neoplasm of breast: Secondary | ICD-10-CM | POA: Insufficient documentation

## 2020-03-12 DIAGNOSIS — E872 Acidosis: Secondary | ICD-10-CM | POA: Diagnosis not present

## 2020-03-12 DIAGNOSIS — E1129 Type 2 diabetes mellitus with other diabetic kidney complication: Secondary | ICD-10-CM | POA: Diagnosis not present

## 2020-03-12 DIAGNOSIS — R809 Proteinuria, unspecified: Secondary | ICD-10-CM | POA: Diagnosis not present

## 2020-03-12 DIAGNOSIS — E211 Secondary hyperparathyroidism, not elsewhere classified: Secondary | ICD-10-CM | POA: Diagnosis not present

## 2020-03-12 DIAGNOSIS — E875 Hyperkalemia: Secondary | ICD-10-CM | POA: Diagnosis not present

## 2020-03-16 DIAGNOSIS — M1991 Primary osteoarthritis, unspecified site: Secondary | ICD-10-CM | POA: Diagnosis not present

## 2020-03-16 DIAGNOSIS — J449 Chronic obstructive pulmonary disease, unspecified: Secondary | ICD-10-CM | POA: Diagnosis not present

## 2020-03-16 DIAGNOSIS — M150 Primary generalized (osteo)arthritis: Secondary | ICD-10-CM | POA: Diagnosis not present

## 2020-03-16 DIAGNOSIS — R531 Weakness: Secondary | ICD-10-CM | POA: Diagnosis not present

## 2020-03-18 ENCOUNTER — Other Ambulatory Visit (HOSPITAL_COMMUNITY): Payer: Self-pay | Admitting: Internal Medicine

## 2020-03-18 ENCOUNTER — Inpatient Hospital Stay
Admission: RE | Admit: 2020-03-18 | Discharge: 2020-03-18 | Disposition: A | Discharge status: Home / Self Care | Payer: Self-pay | Source: Ambulatory Visit | Attending: Internal Medicine | Admitting: Internal Medicine

## 2020-03-18 DIAGNOSIS — Z1231 Encounter for screening mammogram for malignant neoplasm of breast: Secondary | ICD-10-CM

## 2020-04-05 ENCOUNTER — Ambulatory Visit (HOSPITAL_COMMUNITY)
Admission: RE | Admit: 2020-04-05 | Discharge: 2020-04-05 | Disposition: A | Discharge status: Home / Self Care | Payer: PPO | Source: Ambulatory Visit | Attending: Internal Medicine | Admitting: Internal Medicine

## 2020-04-05 ENCOUNTER — Other Ambulatory Visit: Payer: Self-pay

## 2020-04-05 DIAGNOSIS — E2839 Other primary ovarian failure: Secondary | ICD-10-CM | POA: Insufficient documentation

## 2020-04-05 DIAGNOSIS — Z780 Asymptomatic menopausal state: Secondary | ICD-10-CM | POA: Diagnosis not present

## 2020-04-16 DIAGNOSIS — M150 Primary generalized (osteo)arthritis: Secondary | ICD-10-CM | POA: Diagnosis not present

## 2020-04-16 DIAGNOSIS — R531 Weakness: Secondary | ICD-10-CM | POA: Diagnosis not present

## 2020-04-16 DIAGNOSIS — E538 Deficiency of other specified B group vitamins: Secondary | ICD-10-CM | POA: Diagnosis not present

## 2020-04-16 DIAGNOSIS — M1991 Primary osteoarthritis, unspecified site: Secondary | ICD-10-CM | POA: Diagnosis not present

## 2020-04-16 DIAGNOSIS — J449 Chronic obstructive pulmonary disease, unspecified: Secondary | ICD-10-CM | POA: Diagnosis not present

## 2020-04-23 DIAGNOSIS — E1129 Type 2 diabetes mellitus with other diabetic kidney complication: Secondary | ICD-10-CM | POA: Diagnosis not present

## 2020-04-23 DIAGNOSIS — E1122 Type 2 diabetes mellitus with diabetic chronic kidney disease: Secondary | ICD-10-CM | POA: Diagnosis not present

## 2020-04-23 DIAGNOSIS — E875 Hyperkalemia: Secondary | ICD-10-CM | POA: Diagnosis not present

## 2020-04-23 DIAGNOSIS — E872 Acidosis: Secondary | ICD-10-CM | POA: Diagnosis not present

## 2020-04-23 DIAGNOSIS — E211 Secondary hyperparathyroidism, not elsewhere classified: Secondary | ICD-10-CM | POA: Diagnosis not present

## 2020-04-23 DIAGNOSIS — R809 Proteinuria, unspecified: Secondary | ICD-10-CM | POA: Diagnosis not present

## 2020-04-23 DIAGNOSIS — E870 Hyperosmolality and hypernatremia: Secondary | ICD-10-CM | POA: Diagnosis not present

## 2020-04-23 DIAGNOSIS — N189 Chronic kidney disease, unspecified: Secondary | ICD-10-CM | POA: Diagnosis not present

## 2020-04-30 DIAGNOSIS — E211 Secondary hyperparathyroidism, not elsewhere classified: Secondary | ICD-10-CM | POA: Diagnosis not present

## 2020-04-30 DIAGNOSIS — E1129 Type 2 diabetes mellitus with other diabetic kidney complication: Secondary | ICD-10-CM | POA: Diagnosis not present

## 2020-04-30 DIAGNOSIS — N189 Chronic kidney disease, unspecified: Secondary | ICD-10-CM | POA: Diagnosis not present

## 2020-04-30 DIAGNOSIS — R809 Proteinuria, unspecified: Secondary | ICD-10-CM | POA: Diagnosis not present

## 2020-04-30 DIAGNOSIS — E1122 Type 2 diabetes mellitus with diabetic chronic kidney disease: Secondary | ICD-10-CM | POA: Diagnosis not present

## 2020-04-30 DIAGNOSIS — I129 Hypertensive chronic kidney disease with stage 1 through stage 4 chronic kidney disease, or unspecified chronic kidney disease: Secondary | ICD-10-CM | POA: Diagnosis not present

## 2020-05-01 DIAGNOSIS — M25512 Pain in left shoulder: Secondary | ICD-10-CM | POA: Diagnosis not present

## 2020-05-03 ENCOUNTER — Other Ambulatory Visit: Payer: Self-pay

## 2020-05-03 ENCOUNTER — Ambulatory Visit
Admission: EM | Admit: 2020-05-03 | Discharge: 2020-05-03 | Disposition: A | Discharge status: Home / Self Care | Payer: PPO | Attending: Emergency Medicine | Admitting: Emergency Medicine

## 2020-05-03 DIAGNOSIS — M25512 Pain in left shoulder: Secondary | ICD-10-CM

## 2020-05-03 MED ORDER — PREDNISONE 10 MG PO TABS: 20.0000 mg | ORAL_TABLET | Freq: Every day | ORAL | 0 refills | Status: DC

## 2020-05-03 NOTE — ED Triage Notes (Signed)
Pt presents with left shoulder pain for past 4 days , no injury

## 2020-05-03 NOTE — ED Provider Notes (Signed)
[place]   [id number] 05/03/20 Arrival Time: 3016   Chief Complaint  Patient presents with  . Shoulder Pain     SUBJECTIVE: History from: patient.  Lindsay Cortez is a 79 y.o. female who presented to the urgent care for complaint of left shoulder pain for the past 4 days.  Denies any precipitating event, trauma or injury.  She localizes the pain to the left shoulder.  She describes the pain as constant and achy.  She has tried OTC medications without relief.  Her symptoms are made worse with ROM.  She denies similar symptoms in the past.  Denies chills, fever, nausea, vomiting, diarrhea  ROS: As per HPI.  All other pertinent ROS negative.      Past Medical History:  Diagnosis Date  . Arthritis   . Asthma   . Back pain   . COPD (chronic obstructive pulmonary disease) ([place])   . Diabetes ([place]) 09/01/2014  . Diabetes mellitus    x 15 yrs  . Gout   . Hypertension   . Kidney stones   . Pancreatic cyst   . Pneumonia   . Renal disorder   . Shoulder dislocation   . Vertigo    Past Surgical History:  Procedure Laterality Date  . ABDOMINAL HYSTERECTOMY    . BLADDER SURGERY     bladder tac  . BREAST SURGERY     benign  . FOOT SURGERY     Allergies  Allergen Reactions  . Fentanyl Nausea And Vomiting  . Codeine     Narcotic pain medicine makes her nauseated.  Says was told to only take tylenol due to kidney disease.  Mack Hook [Levofloxacin In D5w] Nausea And Vomiting   No current facility-administered medications on file prior to encounter.   Current Outpatient Medications on File Prior to Encounter  Medication Sig Dispense Refill  . acetaminophen (TYLENOL) 500 MG tablet Take 500 mg by mouth daily as needed (pain). For pain    . albuterol (PROVENTIL) (2.5 MG/3ML) 0.083% nebulizer solution Take 3 mLs (2.5 mg total) by nebulization every 4 (four) hours as needed for wheezing. 75 mL 1  . albuterol (VENTOLIN HFA) 108 (90 BASE) MCG/ACT inhaler Inhale 2 puffs  into the lungs every 4 (four) hours as needed for wheezing or shortness of breath. For rescue 1 Inhaler 1  . allopurinol (ZYLOPRIM) 100 MG tablet Take 200 mg by mouth daily.    Marland Kitchen amLODipine (NORVASC) 5 MG tablet Take 5 mg by mouth daily.    . budesonide-formoterol (SYMBICORT) 80-4.5 MCG/ACT inhaler Inhale 2 puffs into the lungs 2 (two) times daily.     . Calcium Carbonate-Vitamin D (CALCIUM 600 + D PO) Take 1 tablet by mouth daily.      . Cholecalciferol (VITAMIN D) 2000 UNITS CAPS Take 1 capsule by mouth every morning.      Marland Kitchen glimepiride (AMARYL) 2 MG tablet Take 2-4 mg by mouth 2 (two) times daily. 2 in the morning and 1 at bedtime    . levothyroxine (SYNTHROID) 75 MCG tablet Take 75 mcg by mouth daily.    Marland Kitchen loperamide (IMODIUM) 2 MG capsule Take 2 mg by mouth every morning.      . meclizine (ANTIVERT) 25 MG tablet Take 1 tablet (25 mg total) by mouth 3 (three) times daily as needed for dizziness. (Patient not taking: Reported on 07/29/2019) 30 tablet 0  . metoprolol succinate (TOPROL-XL) 50 MG 24 hr tablet Take 50 mg by mouth daily.    Marland Kitchen  promethazine (PHENERGAN) 25 MG suppository Place 1 suppository (25 mg total) rectally every 6 (six) hours as needed for nausea or vomiting. 12 each 0  . simvastatin (ZOCOR) 10 MG tablet Take 10 mg by mouth at bedtime.       Social History   Socioeconomic History  . Marital status: Widowed    Spouse name: Not on file  . Number of children: 2  . Years of education: Not on file  . Highest education level: Not on file  Occupational History    Employer: RETIRED  Tobacco Use  . Smoking status: Never Smoker  . Smokeless tobacco: Never Used  Substance and Sexual Activity  . Alcohol use: No    Alcohol/week: 0.0 standard drinks  . Drug use: No  . Sexual activity: Not on file  Other Topics Concern  . Not on file  Social History Narrative  . Not on file   Social Determinants of Health   Financial Resource Strain:   . Difficulty of Paying Living  Expenses: Not on file  Food Insecurity:   . Worried About Charity fundraiser in the Last Year: Not on file  . Ran Out of Food in the Last Year: Not on file  Transportation Needs:   . Lack of Transportation (Medical): Not on file  . Lack of Transportation (Non-Medical): Not on file  Physical Activity:   . Days of Exercise per Week: Not on file  . Minutes of Exercise per Session: Not on file  Stress:   . Feeling of Stress : Not on file  Social Connections:   . Frequency of Communication with Friends and Family: Not on file  . Frequency of Social Gatherings with Friends and Family: Not on file  . Attends Religious Services: Not on file  . Active Member of Clubs or Organizations: Not on file  . Attends Archivist Meetings: Not on file  . Marital Status: Not on file  Intimate Partner Violence:   . Fear of Current or Ex-Partner: Not on file  . Emotionally Abused: Not on file  . Physically Abused: Not on file  . Sexually Abused: Not on file   Family History  Problem Relation Age of Onset  . Breast cancer Mother   . Emphysema Father   . COPD Sister   . Atrial fibrillation Sister   . Obesity Brother   . Cirrhosis Daughter   . COPD Son   . Diabetes Brother   . Asthma Brother     OBJECTIVE:  Vitals:   05/03/20 1626  BP: (!) 143/81  Pulse: 76  Resp: 16  Temp: 97.9 F (36.6 C)  TempSrc: Oral  SpO2: 96%     Physical Exam Vitals and nursing note reviewed.  Constitutional:      General: She is not in acute distress.    Appearance: Normal appearance. She is normal weight. She is not ill-appearing, toxic-appearing or diaphoretic.  HENT:     Head: Normocephalic.  Cardiovascular:     Rate and Rhythm: Normal rate and regular rhythm.     Pulses: Normal pulses.     Heart sounds: Normal heart sounds. No murmur heard.  No friction rub. No gallop.   Pulmonary:     Effort: Pulmonary effort is normal. No respiratory distress.     Breath sounds: Normal breath sounds. No  stridor. No wheezing, rhonchi or rales.  Chest:     Chest wall: No tenderness.  Musculoskeletal:        General:  Tenderness present.     Right shoulder: Normal.     Left shoulder: Tenderness present.     Comments: The left shoulder is without any obvious asymmetry when compared to the right shoulder.  Warmth and redness present.  No swelling, open wound, lesion or surface trauma.  Limited range of motion due to pain.  Neurovascular status intact.  Neurological:     Mental Status: She is alert and oriented to person, place, and time.      LABS:  No results found for this or any previous visit (from the past 24 hour(s)).   ASSESSMENT & PLAN:  1. Acute pain of left shoulder     Meds ordered this encounter  Medications  . predniSONE (DELTASONE) 10 MG tablet    Sig: Take 2 tablets (20 mg total) by mouth daily.    Dispense:  15 tablet    Refill:  0    Discharge Instructions    Prednisone was prescribed/take as directed Continue to take Tylenol arthritis as needed for pain Follow RICE instruction that is attached Follow-up with PCP Return or go to ED for worsening symptoms    Reviewed expectations re: course of current medical issues. Questions answered. Outlined signs and symptoms indicating need for more acute intervention. Patient verbalized understanding. After Visit Summary given.      Note: This document was prepared using Dragon voice recognition software and may include unintentional dictation errors.    Emerson Monte, FNP 05/03/20 1708

## 2020-05-03 NOTE — Discharge Instructions (Signed)
Prednisone was prescribed/take as directed Continue to take Tylenol arthritis as needed for pain Follow RICE instruction that is attached Follow-up with PCP Return or go to ED for worsening symptoms

## 2020-05-07 DIAGNOSIS — L03114 Cellulitis of left upper limb: Secondary | ICD-10-CM | POA: Diagnosis not present

## 2020-05-07 DIAGNOSIS — J449 Chronic obstructive pulmonary disease, unspecified: Secondary | ICD-10-CM | POA: Diagnosis not present

## 2020-05-07 DIAGNOSIS — Z6829 Body mass index (BMI) 29.0-29.9, adult: Secondary | ICD-10-CM | POA: Diagnosis not present

## 2020-05-07 DIAGNOSIS — E1129 Type 2 diabetes mellitus with other diabetic kidney complication: Secondary | ICD-10-CM | POA: Diagnosis not present

## 2020-05-07 DIAGNOSIS — M25812 Other specified joint disorders, left shoulder: Secondary | ICD-10-CM | POA: Diagnosis not present

## 2020-05-07 DIAGNOSIS — E1165 Type 2 diabetes mellitus with hyperglycemia: Secondary | ICD-10-CM | POA: Diagnosis not present

## 2020-05-07 DIAGNOSIS — M19012 Primary osteoarthritis, left shoulder: Secondary | ICD-10-CM | POA: Diagnosis not present

## 2020-05-25 ENCOUNTER — Ambulatory Visit: Payer: PPO | Admitting: Orthopedic Surgery

## 2020-05-25 ENCOUNTER — Other Ambulatory Visit: Payer: Self-pay

## 2020-05-25 ENCOUNTER — Encounter: Payer: Self-pay | Admitting: Orthopedic Surgery

## 2020-05-25 ENCOUNTER — Ambulatory Visit: Payer: PPO

## 2020-05-25 VITALS — BP 135/70 | HR 82

## 2020-05-25 DIAGNOSIS — M25512 Pain in left shoulder: Secondary | ICD-10-CM

## 2020-05-25 NOTE — Progress Notes (Signed)
New Patient Visit  Assessment: Lindsay Cortez is a 79 y.o. RHD female with left shoulder rotator cuff arthropathy  Plan: I had extensive discussion with Lindsay Cortez in clinic today in regards to her left shoulder.  I reviewed the radiographs which demonstrates obvious proximal humeral migration, consistent with a complete and chronic rotator cuff tear.  She has previously had injections in her left shoulder, with excellent relief.  She most recently had a flareup of pain in her left shoulder, and would like to proceed with a repeat injection.  We did briefly discuss reverse shoulder arthroplasty if she is interested in the future.  She can return to clinic if she wants a repeat injection in the future.  In addition, she has any further issues with the right shoulder, I am happy to see her in clinic.  Procedure note injection Left shoulder    Verbal consent was obtained to inject the left shoulder, subacromial space Timeout was completed to confirm the site of injection.  The skin was prepped with alcohol and ethyl chloride was sprayed at the injection site.  A 21-gauge needle was used to inject 40 mg of Depo-Medrol and 1% lidocaine (3 cc) into the subacromial space of the left shoulder using a posterolateral approach.  There were no complications. A sterile bandage was applied.  Follow-up: Return if symptoms worsen or fail to improve.  Subjective:  Chief Complaint  Patient presents with  . Shoulder Pain    left shoulder, no injury, had a fall about a month ago, may have hurt the shoulder picking up the walker.     History of Present Illness: Lindsay Cortez is a 79 y.o. RHD female who presents for evaluation of her left shoulder.  Briefly, she has had pain and decreased range of motion of her left shoulder for several years.  She has previously received steroid injections in her shoulder with excellent relief.  These are typically done at her primary care physician office, but she is unable  to schedule an appointment.  Approximately 3 weeks ago, she had an acute onset of severe pain and discomfort in her shoulder.  At that time, was noted that she had some swelling, and some associated redness in her upper arm.  As such, she presented for evaluation at the urgent care, and was given prednisone and antibiotic.  Since then, the redness and swelling has improved, but she continues to have severe discomfort, as well as restricted range of motion.  She is unable to get her left shoulder above the level of her shoulder blade.  She is also had issues in the right shoulder, and is seen Dr. Aline Brochure in the past.  It was noted that she had an ear repairable rotator cuff tear, but continues to do fairly well overall.  Review of Systems: No numbness or tingling No chest pain No shortness of breath.  Medical History:  Past Medical History:  Diagnosis Date  . Arthritis   . Asthma   . Back pain   . COPD (chronic obstructive pulmonary disease) ([place])   . Diabetes ([place]) 09/01/2014  . Diabetes mellitus    x 15 yrs  . Gout   . Hypertension   . Kidney stones   . Pancreatic cyst   . Pneumonia   . Renal disorder   . Shoulder dislocation   . Vertigo     Past Surgical History:  Procedure Laterality Date  . ABDOMINAL HYSTERECTOMY    . BLADDER SURGERY  bladder tac  . BREAST SURGERY     benign  . FOOT SURGERY      Family History  Problem Relation Age of Onset  . Breast cancer Mother   . Emphysema Father   . COPD Sister   . Atrial fibrillation Sister   . Obesity Brother   . Cirrhosis Daughter   . COPD Son   . Diabetes Brother   . Asthma Brother    Social History   Tobacco Use  . Smoking status: Never Smoker  . Smokeless tobacco: Never Used  Substance Use Topics  . Alcohol use: No    Alcohol/week: 0.0 standard drinks  . Drug use: No    Allergies  Allergen Reactions  . Fentanyl Nausea And Vomiting  . Codeine     Narcotic pain medicine makes her nauseated.  Says was  told to only take tylenol due to kidney disease.  Mack Hook [Levofloxacin In D5w] Nausea And Vomiting    Current Meds  Medication Sig  . acetaminophen (TYLENOL) 500 MG tablet Take 500 mg by mouth daily as needed (pain). For pain  . albuterol (PROVENTIL) (2.5 MG/3ML) 0.083% nebulizer solution Take 3 mLs (2.5 mg total) by nebulization every 4 (four) hours as needed for wheezing.  Marland Kitchen albuterol (VENTOLIN HFA) 108 (90 BASE) MCG/ACT inhaler Inhale 2 puffs into the lungs every 4 (four) hours as needed for wheezing or shortness of breath. For rescue  . alendronate (FOSAMAX) 70 MG tablet Take 70 mg by mouth once a week.  Marland Kitchen allopurinol (ZYLOPRIM) 100 MG tablet Take 200 mg by mouth daily.  Marland Kitchen amLODipine (NORVASC) 5 MG tablet Take 5 mg by mouth daily.  . budesonide-formoterol (SYMBICORT) 80-4.5 MCG/ACT inhaler Inhale 2 puffs into the lungs 2 (two) times daily.   . Calcium Carbonate-Vitamin D (CALCIUM 600 + D PO) Take 1 tablet by mouth daily.    . Cholecalciferol (VITAMIN D) 2000 UNITS CAPS Take 1 capsule by mouth every morning.    Marland Kitchen glimepiride (AMARYL) 2 MG tablet Take 2-4 mg by mouth 2 (two) times daily. 2 in the morning and 1 at bedtime  . levothyroxine (SYNTHROID) 75 MCG tablet Take 75 mcg by mouth daily.  Marland Kitchen lisinopril (ZESTRIL) 2.5 MG tablet Take 2.5 mg by mouth daily.  Marland Kitchen loperamide (IMODIUM) 2 MG capsule Take 2 mg by mouth every morning.    . meclizine (ANTIVERT) 25 MG tablet Take 1 tablet (25 mg total) by mouth 3 (three) times daily as needed for dizziness.  . metoprolol succinate (TOPROL-XL) 50 MG 24 hr tablet Take 50 mg by mouth daily.  . predniSONE (DELTASONE) 10 MG tablet Take 2 tablets (20 mg total) by mouth daily.  . promethazine (PHENERGAN) 25 MG suppository Place 1 suppository (25 mg total) rectally every 6 (six) hours as needed for nausea or vomiting.  . simvastatin (ZOCOR) 10 MG tablet Take 10 mg by mouth at bedtime.      Objective: BP 135/70   Pulse 82   Physical Exam:  Elderly  female in no acute distress. Alert and oriented She has a slow, shuffling gait and was not using an assistive device in clinic today.  Evaluation of the left shoulder demonstrates no obvious deformity.  Normal muscle bulk compared to the contralateral side.  She has limited in forward flexion to approximately 80 degrees.  She tolerates passive range of motion beyond 90 degrees.  Limited internal rotation to her lumbar spine.  External rotation at her side to approximately 30 degrees.  Crepitus is appreciated with range of motion testing.  Evaluation of the right shoulder demonstrates no obvious deformity.  Normal muscle bulk.  Active range of motion to approximately 100 degrees.  Passive range of motion beyond this led to an audible click and crunching sensation.  Internal rotation to the lumbar spine.   IMAGING: I personally ordered and reviewed the following images:  X-rays of the left shoulder were obtained in clinic today demonstrates proximal humeral migration with the humeral head in contact with the acromion.  No acute injuries noted.  No orders of the defined types were placed in this encounter.     Mordecai Rasmussen, MD  05/25/2020 12:36 PM

## 2020-05-25 NOTE — Patient Instructions (Signed)

## 2020-06-09 DIAGNOSIS — E538 Deficiency of other specified B group vitamins: Secondary | ICD-10-CM | POA: Diagnosis not present

## 2020-06-09 DIAGNOSIS — Z23 Encounter for immunization: Secondary | ICD-10-CM | POA: Diagnosis not present

## 2020-06-24 DIAGNOSIS — E1122 Type 2 diabetes mellitus with diabetic chronic kidney disease: Secondary | ICD-10-CM | POA: Diagnosis not present

## 2020-06-24 DIAGNOSIS — E211 Secondary hyperparathyroidism, not elsewhere classified: Secondary | ICD-10-CM | POA: Diagnosis not present

## 2020-06-24 DIAGNOSIS — R809 Proteinuria, unspecified: Secondary | ICD-10-CM | POA: Diagnosis not present

## 2020-06-24 DIAGNOSIS — E1129 Type 2 diabetes mellitus with other diabetic kidney complication: Secondary | ICD-10-CM | POA: Diagnosis not present

## 2020-06-24 DIAGNOSIS — I129 Hypertensive chronic kidney disease with stage 1 through stage 4 chronic kidney disease, or unspecified chronic kidney disease: Secondary | ICD-10-CM | POA: Diagnosis not present

## 2020-06-24 DIAGNOSIS — N189 Chronic kidney disease, unspecified: Secondary | ICD-10-CM | POA: Diagnosis not present

## 2020-07-02 DIAGNOSIS — I129 Hypertensive chronic kidney disease with stage 1 through stage 4 chronic kidney disease, or unspecified chronic kidney disease: Secondary | ICD-10-CM | POA: Diagnosis not present

## 2020-07-02 DIAGNOSIS — N189 Chronic kidney disease, unspecified: Secondary | ICD-10-CM | POA: Diagnosis not present

## 2020-07-02 DIAGNOSIS — R809 Proteinuria, unspecified: Secondary | ICD-10-CM | POA: Diagnosis not present

## 2020-07-02 DIAGNOSIS — E1122 Type 2 diabetes mellitus with diabetic chronic kidney disease: Secondary | ICD-10-CM | POA: Diagnosis not present

## 2020-07-02 DIAGNOSIS — E1129 Type 2 diabetes mellitus with other diabetic kidney complication: Secondary | ICD-10-CM | POA: Diagnosis not present

## 2020-07-02 DIAGNOSIS — E211 Secondary hyperparathyroidism, not elsewhere classified: Secondary | ICD-10-CM | POA: Diagnosis not present

## 2020-07-06 DIAGNOSIS — E538 Deficiency of other specified B group vitamins: Secondary | ICD-10-CM | POA: Diagnosis not present

## 2020-07-07 ENCOUNTER — Other Ambulatory Visit (HOSPITAL_COMMUNITY): Payer: PPO

## 2020-07-14 ENCOUNTER — Ambulatory Visit (HOSPITAL_COMMUNITY): Payer: PPO | Admitting: Hematology

## 2020-08-04 DIAGNOSIS — E538 Deficiency of other specified B group vitamins: Secondary | ICD-10-CM | POA: Diagnosis not present

## 2020-09-03 DIAGNOSIS — R809 Proteinuria, unspecified: Secondary | ICD-10-CM | POA: Diagnosis not present

## 2020-09-03 DIAGNOSIS — I129 Hypertensive chronic kidney disease with stage 1 through stage 4 chronic kidney disease, or unspecified chronic kidney disease: Secondary | ICD-10-CM | POA: Diagnosis not present

## 2020-09-03 DIAGNOSIS — E211 Secondary hyperparathyroidism, not elsewhere classified: Secondary | ICD-10-CM | POA: Diagnosis not present

## 2020-09-03 DIAGNOSIS — E1129 Type 2 diabetes mellitus with other diabetic kidney complication: Secondary | ICD-10-CM | POA: Diagnosis not present

## 2020-09-03 DIAGNOSIS — N189 Chronic kidney disease, unspecified: Secondary | ICD-10-CM | POA: Diagnosis not present

## 2020-09-03 DIAGNOSIS — E1122 Type 2 diabetes mellitus with diabetic chronic kidney disease: Secondary | ICD-10-CM | POA: Diagnosis not present

## 2020-09-09 DIAGNOSIS — E1129 Type 2 diabetes mellitus with other diabetic kidney complication: Secondary | ICD-10-CM | POA: Diagnosis not present

## 2020-09-09 DIAGNOSIS — R809 Proteinuria, unspecified: Secondary | ICD-10-CM | POA: Diagnosis not present

## 2020-09-09 DIAGNOSIS — I129 Hypertensive chronic kidney disease with stage 1 through stage 4 chronic kidney disease, or unspecified chronic kidney disease: Secondary | ICD-10-CM | POA: Diagnosis not present

## 2020-09-09 DIAGNOSIS — N189 Chronic kidney disease, unspecified: Secondary | ICD-10-CM | POA: Diagnosis not present

## 2020-09-09 DIAGNOSIS — E1122 Type 2 diabetes mellitus with diabetic chronic kidney disease: Secondary | ICD-10-CM | POA: Diagnosis not present

## 2020-09-09 DIAGNOSIS — E211 Secondary hyperparathyroidism, not elsewhere classified: Secondary | ICD-10-CM | POA: Diagnosis not present

## 2020-09-30 DIAGNOSIS — E538 Deficiency of other specified B group vitamins: Secondary | ICD-10-CM | POA: Diagnosis not present

## 2020-11-03 DIAGNOSIS — E1122 Type 2 diabetes mellitus with diabetic chronic kidney disease: Secondary | ICD-10-CM | POA: Diagnosis not present

## 2020-11-03 DIAGNOSIS — I129 Hypertensive chronic kidney disease with stage 1 through stage 4 chronic kidney disease, or unspecified chronic kidney disease: Secondary | ICD-10-CM | POA: Diagnosis not present

## 2020-11-03 DIAGNOSIS — E1129 Type 2 diabetes mellitus with other diabetic kidney complication: Secondary | ICD-10-CM | POA: Diagnosis not present

## 2020-11-03 DIAGNOSIS — E538 Deficiency of other specified B group vitamins: Secondary | ICD-10-CM | POA: Diagnosis not present

## 2020-11-03 DIAGNOSIS — E211 Secondary hyperparathyroidism, not elsewhere classified: Secondary | ICD-10-CM | POA: Diagnosis not present

## 2020-11-03 DIAGNOSIS — N189 Chronic kidney disease, unspecified: Secondary | ICD-10-CM | POA: Diagnosis not present

## 2020-11-03 DIAGNOSIS — R809 Proteinuria, unspecified: Secondary | ICD-10-CM | POA: Diagnosis not present

## 2020-11-11 DIAGNOSIS — E870 Hyperosmolality and hypernatremia: Secondary | ICD-10-CM | POA: Diagnosis not present

## 2020-11-11 DIAGNOSIS — R809 Proteinuria, unspecified: Secondary | ICD-10-CM | POA: Diagnosis not present

## 2020-11-11 DIAGNOSIS — E1129 Type 2 diabetes mellitus with other diabetic kidney complication: Secondary | ICD-10-CM | POA: Diagnosis not present

## 2020-11-11 DIAGNOSIS — I129 Hypertensive chronic kidney disease with stage 1 through stage 4 chronic kidney disease, or unspecified chronic kidney disease: Secondary | ICD-10-CM | POA: Diagnosis not present

## 2020-11-11 DIAGNOSIS — E1122 Type 2 diabetes mellitus with diabetic chronic kidney disease: Secondary | ICD-10-CM | POA: Diagnosis not present

## 2020-11-11 DIAGNOSIS — N189 Chronic kidney disease, unspecified: Secondary | ICD-10-CM | POA: Diagnosis not present

## 2020-11-11 DIAGNOSIS — E872 Acidosis: Secondary | ICD-10-CM | POA: Diagnosis not present

## 2020-12-03 DIAGNOSIS — I700 Atherosclerosis of aorta: Secondary | ICD-10-CM | POA: Diagnosis not present

## 2020-12-03 DIAGNOSIS — E663 Overweight: Secondary | ICD-10-CM | POA: Diagnosis not present

## 2020-12-03 DIAGNOSIS — M1991 Primary osteoarthritis, unspecified site: Secondary | ICD-10-CM | POA: Diagnosis not present

## 2020-12-03 DIAGNOSIS — E7849 Other hyperlipidemia: Secondary | ICD-10-CM | POA: Diagnosis not present

## 2020-12-03 DIAGNOSIS — Z6829 Body mass index (BMI) 29.0-29.9, adult: Secondary | ICD-10-CM | POA: Diagnosis not present

## 2020-12-03 DIAGNOSIS — E1165 Type 2 diabetes mellitus with hyperglycemia: Secondary | ICD-10-CM | POA: Diagnosis not present

## 2020-12-03 DIAGNOSIS — I10 Essential (primary) hypertension: Secondary | ICD-10-CM | POA: Diagnosis not present

## 2021-01-12 DIAGNOSIS — E538 Deficiency of other specified B group vitamins: Secondary | ICD-10-CM | POA: Diagnosis not present

## 2021-01-28 ENCOUNTER — Ambulatory Visit (INDEPENDENT_AMBULATORY_CARE_PROVIDER_SITE_OTHER): Payer: PPO | Admitting: Orthopedic Surgery

## 2021-01-28 ENCOUNTER — Encounter: Payer: Self-pay | Admitting: Orthopedic Surgery

## 2021-01-28 ENCOUNTER — Other Ambulatory Visit: Payer: Self-pay

## 2021-01-28 VITALS — BP 156/83 | HR 83 | Ht <= 58 in | Wt 140.2 lb

## 2021-01-28 DIAGNOSIS — M75102 Unspecified rotator cuff tear or rupture of left shoulder, not specified as traumatic: Secondary | ICD-10-CM | POA: Diagnosis not present

## 2021-01-28 DIAGNOSIS — M12812 Other specific arthropathies, not elsewhere classified, left shoulder: Secondary | ICD-10-CM | POA: Diagnosis not present

## 2021-01-28 NOTE — Progress Notes (Signed)
Orthopaedic Clinic Return  Assessment: Lindsay Cortez is a 80 y.o. female with the following: Left rotator cuff arthropathy  Plan: Last injection lasted 8 months.  She is interested in a repeat injection.  She is not interested in consideration for surgery.  Follow up as needed.  Wait at least 3 months if interested in another injection.   Procedure note injection Left shoulder    Verbal consent was obtained to inject the left shoulder, subacromial space Timeout was completed to confirm the site of injection.  The skin was prepped with alcohol and ethyl chloride was sprayed at the injection site.  A 21-gauge needle was used to inject 40 mg of Depo-Medrol and 1% lidocaine (3 cc) into the subacromial space of the left shoulder using a posterolateral approach.  There were no complications. A sterile bandage was applied.   Body mass index is 32.59 kg/m.  Follow-up: Return if symptoms worsen or fail to improve.   Subjective:  Chief Complaint  Patient presents with  . Shoulder Pain    Left/request injection    History of Present Illness: Lindsay Cortez is a 80 y.o. female who returns to clinic for repeat evaluation of her left shoulder. She was last seen over 8 months ago.  At the last visit she had an injection in her left shoulder and had excellent relief until recently.  Her pain has significantly worsened over the past few weeks.  No specific injury.  She has difficulty getting her arm above her head.  No numbness or tingling.   Review of Systems: No fevers or chills No numbness or tingling No chest pain No shortness of breath No bowel or bladder dysfunction No GI distress No headaches    Objective: BP (!) 156/83   Pulse 83   Ht 4\' 7"  (1.397 m)   Wt 140 lb 3.2 oz (63.6 kg)   BMI 32.59 kg/m   Physical Exam:  Alert and oriented.  No acute distress   Walks with assistive device.  No deformity about the shoulder.  No atrophy.  Forward limited to 90 degrees.   Abduction less than 90 degrees.  Increasing pain with range of motion beyond these measurements.  Fingers are warm and well-perfused.   IMAGING: I personally ordered and reviewed the following images:   No new imaging obtained today.  Mordecai Rasmussen, MD 01/28/2021 8:57 AM

## 2021-01-28 NOTE — Patient Instructions (Signed)

## 2021-02-04 ENCOUNTER — Telehealth: Payer: Self-pay | Admitting: Orthopedic Surgery

## 2021-02-04 MED ORDER — PREDNISONE 10 MG (21) PO TBPK: ORAL_TABLET | ORAL | 0 refills | Status: DC

## 2021-02-04 NOTE — Telephone Encounter (Signed)
Called pt and let her know rx has been sent. States he pain has gotten really bad and she's been using the voltaren rubs and tylenol with very little relief. Asked pt to keep [place] posted on her pain so that it can assist you with any further treatment.

## 2021-02-04 NOTE — Telephone Encounter (Signed)
Patient called and stated that the shoulder injection she got last week did not help much.  She is still having pain and can't do anything.  She wants to know if Dr. Amedeo Kinsman can prescribe her Prednisone.  She says she has taken this in the past with good results.  She uses [facility] in [facility] [employer] [facility]

## 2021-02-11 ENCOUNTER — Other Ambulatory Visit: Payer: Self-pay

## 2021-02-11 ENCOUNTER — Encounter: Payer: Self-pay | Admitting: Orthopedic Surgery

## 2021-02-11 ENCOUNTER — Ambulatory Visit: Payer: PPO | Admitting: Orthopedic Surgery

## 2021-02-11 VITALS — BP 163/85 | HR 84 | Ht <= 58 in | Wt 135.1 lb

## 2021-02-11 DIAGNOSIS — M12812 Other specific arthropathies, not elsewhere classified, left shoulder: Secondary | ICD-10-CM | POA: Diagnosis not present

## 2021-02-11 DIAGNOSIS — M75102 Unspecified rotator cuff tear or rupture of left shoulder, not specified as traumatic: Secondary | ICD-10-CM | POA: Diagnosis not present

## 2021-02-11 MED ORDER — CYCLOBENZAPRINE HCL 5 MG PO TABS
5.0000 mg | ORAL_TABLET | Freq: Every day | ORAL | 0 refills | Status: DC
Start: 2021-02-11 — End: 2022-11-15

## 2021-02-11 NOTE — Progress Notes (Signed)
Orthopaedic Clinic Return  Assessment: Lindsay Cortez is a 80 y.o. female with the following: Left rotator cuff arthropathy  Plan: Patient received injection left shoulder 2 weeks ago, and has had severe discomfort since.  She recently started prednisone Dosepak, which has improved some of her symptoms.  At this point time, she is having uncomfortable pain in the posterior aspect of the left shoulder blade.  This is most consistent with muscle spasm pain.  As a result, I recommended we try a low-dose of Flexeril.  We discussed this medication in great detail, and she can take it judiciously.  I have advised her to continue with the prednisone Dosepak as well.  Collectively, I hope this will improve her pain, make her more comfortable.  No follow-up is needed at this time, but she will contact us if she has any issues.  Body mass index is 31.41 kg/m.  Follow-up: Return if symptoms worsen or fail to improve.   Subjective:  Chief Complaint  Patient presents with   Shoulder Pain    L/ still hurting and going into my shoulder blade making it real sore.    History of Present Illness: Lindsay Cortez is a 80 y.o. female who returns to clinic for repeat evaluation of her left shoulder.  She was last seen in clinic 2 weeks ago, and received a follow-up steroid injection.  She has had limited relief of her symptoms.  She called the clinic and asked for a prednisone Dosepak.  She has started this, with slight improvement in her symptoms.  Currently, the pain is in the posterior aspect of the left shoulder.  She is comfortable if she maintains her positioning.  However with movement, she notes worsening of the pain.   Review of Systems: No fevers or chills No numbness or tingling No chest pain No shortness of breath No bowel or bladder dysfunction No GI distress No headaches    Objective: BP (!) 163/85   Pulse 84   Ht 4\' 7"  (1.397 m)   Wt 135 lb 2 oz (61.3 kg)   BMI 31.41 kg/m    Physical Exam:  Alert and oriented.  No acute distress   Walks with assistive device.  No deformity about the shoulder.  No atrophy.  Forward limited to 90 degrees.  Abduction less than 90 degrees.  Increasing pain with range of motion beyond these measurements.  Fingers are warm and well-perfused.  Tenderness to palpation of the posterior aspect of the scapula.  IMAGING: I personally ordered and reviewed the following images:   No new imaging obtained today.  Lindsay Rasmussen, MD 02/11/2021 12:56 PM

## 2021-02-16 DIAGNOSIS — E872 Acidosis: Secondary | ICD-10-CM | POA: Diagnosis not present

## 2021-02-16 DIAGNOSIS — E870 Hyperosmolality and hypernatremia: Secondary | ICD-10-CM | POA: Diagnosis not present

## 2021-02-16 DIAGNOSIS — E1122 Type 2 diabetes mellitus with diabetic chronic kidney disease: Secondary | ICD-10-CM | POA: Diagnosis not present

## 2021-02-16 DIAGNOSIS — R809 Proteinuria, unspecified: Secondary | ICD-10-CM | POA: Diagnosis not present

## 2021-02-16 DIAGNOSIS — N189 Chronic kidney disease, unspecified: Secondary | ICD-10-CM | POA: Diagnosis not present

## 2021-02-16 DIAGNOSIS — I129 Hypertensive chronic kidney disease with stage 1 through stage 4 chronic kidney disease, or unspecified chronic kidney disease: Secondary | ICD-10-CM | POA: Diagnosis not present

## 2021-02-16 DIAGNOSIS — E1129 Type 2 diabetes mellitus with other diabetic kidney complication: Secondary | ICD-10-CM | POA: Diagnosis not present

## 2021-02-23 DIAGNOSIS — N049 Nephrotic syndrome with unspecified morphologic changes: Secondary | ICD-10-CM | POA: Diagnosis not present

## 2021-02-23 DIAGNOSIS — E1122 Type 2 diabetes mellitus with diabetic chronic kidney disease: Secondary | ICD-10-CM | POA: Diagnosis not present

## 2021-02-23 DIAGNOSIS — E870 Hyperosmolality and hypernatremia: Secondary | ICD-10-CM | POA: Diagnosis not present

## 2021-02-23 DIAGNOSIS — E1129 Type 2 diabetes mellitus with other diabetic kidney complication: Secondary | ICD-10-CM | POA: Diagnosis not present

## 2021-02-23 DIAGNOSIS — I129 Hypertensive chronic kidney disease with stage 1 through stage 4 chronic kidney disease, or unspecified chronic kidney disease: Secondary | ICD-10-CM | POA: Diagnosis not present

## 2021-02-23 DIAGNOSIS — E872 Acidosis: Secondary | ICD-10-CM | POA: Diagnosis not present

## 2021-02-23 DIAGNOSIS — N189 Chronic kidney disease, unspecified: Secondary | ICD-10-CM | POA: Diagnosis not present

## 2021-02-23 DIAGNOSIS — R809 Proteinuria, unspecified: Secondary | ICD-10-CM | POA: Diagnosis not present

## 2021-02-24 ENCOUNTER — Encounter (HOSPITAL_COMMUNITY): Payer: Self-pay | Admitting: *Deleted

## 2021-02-24 ENCOUNTER — Emergency Department (HOSPITAL_COMMUNITY): Payer: PPO

## 2021-02-24 ENCOUNTER — Other Ambulatory Visit: Payer: Self-pay

## 2021-02-24 ENCOUNTER — Inpatient Hospital Stay (HOSPITAL_COMMUNITY)
Admission: EM | Admit: 2021-02-24 | Discharge: 2021-03-03 | DRG: 871 | Disposition: A | Discharge status: Home Health | Payer: PPO | Attending: Internal Medicine | Admitting: Internal Medicine

## 2021-02-24 DIAGNOSIS — E871 Hypo-osmolality and hyponatremia: Secondary | ICD-10-CM | POA: Diagnosis present

## 2021-02-24 DIAGNOSIS — Z79890 Hormone replacement therapy (postmenopausal): Secondary | ICD-10-CM

## 2021-02-24 DIAGNOSIS — E1165 Type 2 diabetes mellitus with hyperglycemia: Secondary | ICD-10-CM | POA: Diagnosis present

## 2021-02-24 DIAGNOSIS — I10 Essential (primary) hypertension: Secondary | ICD-10-CM | POA: Diagnosis not present

## 2021-02-24 DIAGNOSIS — N179 Acute kidney failure, unspecified: Secondary | ICD-10-CM | POA: Diagnosis not present

## 2021-02-24 DIAGNOSIS — E119 Type 2 diabetes mellitus without complications: Secondary | ICD-10-CM | POA: Diagnosis not present

## 2021-02-24 DIAGNOSIS — E11649 Type 2 diabetes mellitus with hypoglycemia without coma: Secondary | ICD-10-CM | POA: Diagnosis not present

## 2021-02-24 DIAGNOSIS — E039 Hypothyroidism, unspecified: Secondary | ICD-10-CM | POA: Diagnosis present

## 2021-02-24 DIAGNOSIS — R0902 Hypoxemia: Secondary | ICD-10-CM

## 2021-02-24 DIAGNOSIS — N184 Chronic kidney disease, stage 4 (severe): Secondary | ICD-10-CM | POA: Diagnosis present

## 2021-02-24 DIAGNOSIS — J440 Chronic obstructive pulmonary disease with acute lower respiratory infection: Secondary | ICD-10-CM | POA: Diagnosis present

## 2021-02-24 DIAGNOSIS — J9601 Acute respiratory failure with hypoxia: Secondary | ICD-10-CM | POA: Diagnosis not present

## 2021-02-24 DIAGNOSIS — M1990 Unspecified osteoarthritis, unspecified site: Secondary | ICD-10-CM | POA: Diagnosis not present

## 2021-02-24 DIAGNOSIS — E785 Hyperlipidemia, unspecified: Secondary | ICD-10-CM | POA: Diagnosis present

## 2021-02-24 DIAGNOSIS — E875 Hyperkalemia: Secondary | ICD-10-CM | POA: Diagnosis not present

## 2021-02-24 DIAGNOSIS — Z803 Family history of malignant neoplasm of breast: Secondary | ICD-10-CM | POA: Diagnosis not present

## 2021-02-24 DIAGNOSIS — I129 Hypertensive chronic kidney disease with stage 1 through stage 4 chronic kidney disease, or unspecified chronic kidney disease: Secondary | ICD-10-CM | POA: Diagnosis not present

## 2021-02-24 DIAGNOSIS — Z833 Family history of diabetes mellitus: Secondary | ICD-10-CM

## 2021-02-24 DIAGNOSIS — N183 Chronic kidney disease, stage 3 unspecified: Secondary | ICD-10-CM | POA: Diagnosis present

## 2021-02-24 DIAGNOSIS — Z825 Family history of asthma and other chronic lower respiratory diseases: Secondary | ICD-10-CM

## 2021-02-24 DIAGNOSIS — Z7951 Long term (current) use of inhaled steroids: Secondary | ICD-10-CM | POA: Diagnosis not present

## 2021-02-24 DIAGNOSIS — R600 Localized edema: Secondary | ICD-10-CM | POA: Diagnosis not present

## 2021-02-24 DIAGNOSIS — Z90710 Acquired absence of both cervix and uterus: Secondary | ICD-10-CM

## 2021-02-24 DIAGNOSIS — R0600 Dyspnea, unspecified: Secondary | ICD-10-CM

## 2021-02-24 DIAGNOSIS — R609 Edema, unspecified: Secondary | ICD-10-CM

## 2021-02-24 DIAGNOSIS — J984 Other disorders of lung: Secondary | ICD-10-CM | POA: Diagnosis not present

## 2021-02-24 DIAGNOSIS — E8809 Other disorders of plasma-protein metabolism, not elsewhere classified: Secondary | ICD-10-CM | POA: Diagnosis not present

## 2021-02-24 DIAGNOSIS — R0602 Shortness of breath: Secondary | ICD-10-CM | POA: Diagnosis not present

## 2021-02-24 DIAGNOSIS — Z79899 Other long term (current) drug therapy: Secondary | ICD-10-CM

## 2021-02-24 DIAGNOSIS — Z7983 Long term (current) use of bisphosphonates: Secondary | ICD-10-CM

## 2021-02-24 DIAGNOSIS — M109 Gout, unspecified: Secondary | ICD-10-CM | POA: Diagnosis not present

## 2021-02-24 DIAGNOSIS — R000 Tachycardia, unspecified: Secondary | ICD-10-CM | POA: Diagnosis not present

## 2021-02-24 DIAGNOSIS — R0609 Other forms of dyspnea: Secondary | ICD-10-CM | POA: Diagnosis not present

## 2021-02-24 DIAGNOSIS — A419 Sepsis, unspecified organism: Secondary | ICD-10-CM | POA: Diagnosis not present

## 2021-02-24 DIAGNOSIS — J449 Chronic obstructive pulmonary disease, unspecified: Secondary | ICD-10-CM | POA: Diagnosis not present

## 2021-02-24 DIAGNOSIS — E860 Dehydration: Secondary | ICD-10-CM | POA: Diagnosis not present

## 2021-02-24 DIAGNOSIS — Z7984 Long term (current) use of oral hypoglycemic drugs: Secondary | ICD-10-CM

## 2021-02-24 DIAGNOSIS — Z20822 Contact with and (suspected) exposure to covid-19: Secondary | ICD-10-CM | POA: Diagnosis present

## 2021-02-24 DIAGNOSIS — J189 Pneumonia, unspecified organism: Secondary | ICD-10-CM | POA: Diagnosis present

## 2021-02-24 DIAGNOSIS — D6489 Other specified anemias: Secondary | ICD-10-CM | POA: Diagnosis not present

## 2021-02-24 DIAGNOSIS — I2699 Other pulmonary embolism without acute cor pulmonale: Secondary | ICD-10-CM

## 2021-02-24 DIAGNOSIS — E1122 Type 2 diabetes mellitus with diabetic chronic kidney disease: Secondary | ICD-10-CM | POA: Diagnosis not present

## 2021-02-24 DIAGNOSIS — R0689 Other abnormalities of breathing: Secondary | ICD-10-CM

## 2021-02-24 DIAGNOSIS — J441 Chronic obstructive pulmonary disease with (acute) exacerbation: Secondary | ICD-10-CM | POA: Diagnosis not present

## 2021-02-24 DIAGNOSIS — J209 Acute bronchitis, unspecified: Secondary | ICD-10-CM | POA: Diagnosis not present

## 2021-02-24 DIAGNOSIS — M25561 Pain in right knee: Secondary | ICD-10-CM

## 2021-02-24 DIAGNOSIS — Z881 Allergy status to other antibiotic agents status: Secondary | ICD-10-CM

## 2021-02-24 DIAGNOSIS — N189 Chronic kidney disease, unspecified: Secondary | ICD-10-CM | POA: Diagnosis present

## 2021-02-24 DIAGNOSIS — J9811 Atelectasis: Secondary | ICD-10-CM | POA: Diagnosis not present

## 2021-02-24 DIAGNOSIS — M1711 Unilateral primary osteoarthritis, right knee: Secondary | ICD-10-CM

## 2021-02-24 DIAGNOSIS — D72829 Elevated white blood cell count, unspecified: Secondary | ICD-10-CM | POA: Diagnosis not present

## 2021-02-24 DIAGNOSIS — Z87442 Personal history of urinary calculi: Secondary | ICD-10-CM

## 2021-02-24 DIAGNOSIS — Z1389 Encounter for screening for other disorder: Secondary | ICD-10-CM | POA: Diagnosis not present

## 2021-02-24 DIAGNOSIS — J90 Pleural effusion, not elsewhere classified: Secondary | ICD-10-CM | POA: Diagnosis not present

## 2021-02-24 DIAGNOSIS — Z885 Allergy status to narcotic agent status: Secondary | ICD-10-CM

## 2021-02-24 HISTORY — DX: Disorder of kidney and ureter, unspecified: N28.9

## 2021-02-24 LAB — PROTIME-INR
INR: 1.1 (ref 0.8–1.2)
Prothrombin Time: 14.6 seconds (ref 11.4–15.2)

## 2021-02-24 LAB — CBC WITH DIFFERENTIAL/PLATELET
Abs Immature Granulocytes: 0.32 10*3/uL — ABNORMAL HIGH (ref 0.00–0.07)
Basophils Absolute: 0.1 10*3/uL (ref 0.0–0.1)
Basophils Relative: 0 %
Eosinophils Absolute: 0 10*3/uL (ref 0.0–0.5)
Eosinophils Relative: 0 %
HCT: 34.1 % — ABNORMAL LOW (ref 36.0–46.0)
Hemoglobin: 11.3 g/dL — ABNORMAL LOW (ref 12.0–15.0)
Immature Granulocytes: 2 %
Lymphocytes Relative: 3 %
Lymphs Abs: 0.5 10*3/uL — ABNORMAL LOW (ref 0.7–4.0)
MCH: 31.4 pg (ref 26.0–34.0)
MCHC: 33.1 g/dL (ref 30.0–36.0)
MCV: 94.7 fL (ref 80.0–100.0)
Monocytes Absolute: 1.9 10*3/uL — ABNORMAL HIGH (ref 0.1–1.0)
Monocytes Relative: 9 %
Neutro Abs: 18.5 10*3/uL — ABNORMAL HIGH (ref 1.7–7.7)
Neutrophils Relative %: 86 %
Platelets: 231 10*3/uL (ref 150–400)
RBC: 3.6 MIL/uL — ABNORMAL LOW (ref 3.87–5.11)
RDW: 14.5 % (ref 11.5–15.5)
WBC: 21.4 10*3/uL — ABNORMAL HIGH (ref 4.0–10.5)
nRBC: 0 % (ref 0.0–0.2)

## 2021-02-24 LAB — COMPREHENSIVE METABOLIC PANEL
ALT: 16 U/L (ref 0–44)
AST: 18 U/L (ref 15–41)
Albumin: 2.6 g/dL — ABNORMAL LOW (ref 3.5–5.0)
Alkaline Phosphatase: 133 U/L — ABNORMAL HIGH (ref 38–126)
Anion gap: 10 (ref 5–15)
BUN: 21 mg/dL (ref 8–23)
CO2: 22 mmol/L (ref 22–32)
Calcium: 7.8 mg/dL — ABNORMAL LOW (ref 8.9–10.3)
Chloride: 101 mmol/L (ref 98–111)
Creatinine, Ser: 1.82 mg/dL — ABNORMAL HIGH (ref 0.44–1.00)
GFR, Estimated: 28 mL/min — ABNORMAL LOW (ref >60)
Glucose, Bld: 238 mg/dL — ABNORMAL HIGH (ref 70–99)
Potassium: 3.5 mmol/L (ref 3.5–5.1)
Sodium: 133 mmol/L — ABNORMAL LOW (ref 135–145)
Total Bilirubin: 1.1 mg/dL (ref 0.3–1.2)
Total Protein: 5.8 g/dL — ABNORMAL LOW (ref 6.5–8.1)

## 2021-02-24 LAB — CBG MONITORING, ED: Glucose-Capillary: 210 mg/dL — ABNORMAL HIGH (ref 70–99)

## 2021-02-24 LAB — APTT: aPTT: 29 seconds (ref 24–36)

## 2021-02-24 LAB — LACTIC ACID, PLASMA
Lactic Acid, Venous: 1.9 mmol/L (ref 0.5–1.9)
Lactic Acid, Venous: 1.2 mmol/L (ref 0.5–1.9)

## 2021-02-24 LAB — RESP PANEL BY RT-PCR (FLU A&B, COVID) ARPGX2
Influenza A by PCR: NEGATIVE
Influenza B by PCR: NEGATIVE
SARS Coronavirus 2 by RT PCR: NEGATIVE

## 2021-02-24 MED ORDER — INSULIN ASPART 100 UNIT/ML IJ SOLN
0.0000 [IU] | Freq: Every day | INTRAMUSCULAR | Status: DC
  Administered 2021-02-24 – 2021-02-25 (×2): 2 [IU] via SUBCUTANEOUS
  Administered 2021-02-26: 3 [IU] via SUBCUTANEOUS
  Administered 2021-02-28: 2 [IU] via SUBCUTANEOUS
  Administered 2021-03-01: 3 [IU] via SUBCUTANEOUS
  Filled 2021-02-24: qty 1

## 2021-02-24 MED ORDER — SODIUM CHLORIDE 0.9 % IV SOLN
500.0000 mg | INTRAVENOUS | Status: AC
  Administered 2021-02-24 – 2021-03-02 (×7): 500 mg via INTRAVENOUS
  Filled 2021-02-24 (×7): qty 500

## 2021-02-24 MED ORDER — SODIUM CHLORIDE 0.9 % IV SOLN
2.0000 g | INTRAVENOUS | Status: AC
  Administered 2021-02-24 – 2021-03-02 (×7): 2 g via INTRAVENOUS
  Filled 2021-02-24 (×7): qty 20

## 2021-02-24 MED ORDER — SODIUM CHLORIDE 0.9 % IV BOLUS (SEPSIS)
1000.0000 mL | Freq: Once | INTRAVENOUS | Status: AC
  Administered 2021-02-24: 1000 mL via INTRAVENOUS

## 2021-02-24 MED ORDER — SODIUM CHLORIDE 0.9 % IV SOLN
INTRAVENOUS | Status: AC
Start: 2021-02-24 — End: 2021-02-25
  Administered 2021-02-25: 999 mL via INTRAVENOUS

## 2021-02-24 MED ORDER — GUAIFENESIN-DM 100-10 MG/5ML PO SYRP
5.0000 mL | ORAL_SOLUTION | ORAL | Status: DC | PRN
Start: 2021-02-24 — End: 2021-03-03
  Filled 2021-02-24: qty 5

## 2021-02-24 MED ORDER — GLUCERNA SHAKE PO LIQD
237.0000 mL | Freq: Three times a day (TID) | ORAL | Status: DC
  Filled 2021-02-24 (×11): qty 237

## 2021-02-24 MED ORDER — DM-GUAIFENESIN ER 30-600 MG PO TB12
1.0000 | ORAL_TABLET | Freq: Two times a day (BID) | ORAL | Status: DC
  Administered 2021-02-24 – 2021-03-03 (×14): 1 via ORAL
  Filled 2021-02-24 (×14): qty 1

## 2021-02-24 MED ORDER — INSULIN ASPART 100 UNIT/ML IJ SOLN
0.0000 [IU] | Freq: Three times a day (TID) | INTRAMUSCULAR | Status: DC
  Administered 2021-02-25: 3 [IU] via SUBCUTANEOUS
  Administered 2021-02-25: 2 [IU] via SUBCUTANEOUS
  Administered 2021-02-26: 9 [IU] via SUBCUTANEOUS
  Administered 2021-02-26 (×2): 7 [IU] via SUBCUTANEOUS
  Administered 2021-02-27: 5 [IU] via SUBCUTANEOUS

## 2021-02-24 MED ORDER — ENOXAPARIN SODIUM 30 MG/0.3ML IJ SOSY
30.0000 mg | PREFILLED_SYRINGE | INTRAMUSCULAR | Status: DC
  Administered 2021-02-25 – 2021-02-27 (×3): 30 mg via SUBCUTANEOUS
  Filled 2021-02-24 (×3): qty 0.3

## 2021-02-24 MED ORDER — ACETAMINOPHEN 325 MG PO TABS
650.0000 mg | ORAL_TABLET | Freq: Once | ORAL | Status: AC
  Administered 2021-02-24: 650 mg via ORAL
  Filled 2021-02-24: qty 2

## 2021-02-24 NOTE — ED Notes (Signed)
Pt found to have low saturations and mild respiratory distress.  O2 increased to 3lbnc with no improvement. Increased to 4lbnc with improvement to 93-95%.  Pt reports feeling better with decreased work of breathing noted.

## 2021-02-24 NOTE — ED Triage Notes (Signed)
Short of breath for the past 4 days, getting worse

## 2021-02-24 NOTE — Sepsis Progress Note (Signed)
Sepsis protocol being followed by [facility] 

## 2021-02-24 NOTE — ED Notes (Signed)
Pt given ginger ale per her request.  Family and patient updated regarding admission status

## 2021-02-24 NOTE — H&P (Signed)
History and Physical  Lindsay Cortez EHU:[id number] DOB: 1940-11-04 DOA: 02/24/2021  Referring physician:  Eustaquio Maize, PA-C PCP: [employer]mond [employer], MD  Patient coming from: Home  Chief Complaint: Shortness of breath  HPI: Lindsay Cortez is a 80 y.o. female with medical history significant for hypertension, COPD, T2DM, hypothyroidism, hyperlipidemia, CKD stage IV who presents to the emergency department accompanied by a friend due to 4-day onset of increasing shortness of breath.  Shortness of breath worsens with exertion and was associated with dry cough and pleuritic chest and upper back pain (worsens with cough), she also complained of fatigue and subjective fever.  She denies any sick contact and endorsed getting 2 COVID vaccines and a booster.  She complained of decreased appetite since onset of illness, but she denies nausea, vomiting, sore throat, headache, abdominal pain, diarrhea or constipation.  ED Course:  In the emergency department, she was noted to be febrile with a temperature of 102.36F, she was tachycardic and tachypneic.  Supplemental oxygen via [place] at 2 LPM was provided in the ED due to hypoxia by ED PA.  BP was 150/122.  Work-up in the ED showed leukocytosis, mild hyponatremia, BUN to creatinine 31/1.82 (creatinine within last year ranged between 1.4-2.0), ALP was 133.  Albumin 2.6, influenza AB, B, SARS coronavirus 2 was negative. Chest x-ray showed patchy right lung airspace disease concerning for pneumonia. Patient was treated with Tylenol, IV hydration was provided and she was started on IV ceftriaxone and azithromycin.  Hospitalist was asked to admit patient for further evaluation and management.   Review of Systems: Constitutional: Positive for subjective fever.  Negative for chills  HENT: Negative for ear pain and sore throat.   Eyes: Negative for pain and visual disturbance.  Respiratory: Positive for cough and shortness of breath.   Cardiovascular: Positive for  pleuritic chest pain and negative for palpitations.  Gastrointestinal: Negative for abdominal pain and vomiting.  Endocrine: Negative for polyphagia and polyuria.  Genitourinary: Negative for decreased urine volume, dysuria, enuresis Musculoskeletal: Negative for arthralgias and back pain.  Skin: Negative for color change and rash.  Allergic/Immunologic: Negative for immunocompromised state.  Neurological: Negative for tremors, syncope, speech difficulty  Hematological: Does not bruise/bleed easily.  All other systems reviewed and are negative   Past Medical History:  Diagnosis Date   Arthritis    Asthma    Back pain    COPD (chronic obstructive pulmonary disease) ([place])    Diabetes ([place]) 09/01/2014   Diabetes mellitus    x 15 yrs   Gout    Hypertension    Kidney stones    Pancreatic cyst    Pneumonia    Renal disorder    Shoulder dislocation    Vertigo    Past Surgical History:  Procedure Laterality Date   ABDOMINAL HYSTERECTOMY     BLADDER SURGERY     bladder tac   BREAST SURGERY     benign   FOOT SURGERY      Social History:  reports that she has never smoked. She has never used smokeless tobacco. She reports that she does not drink alcohol and does not use drugs.   Allergies  Allergen Reactions   Fentanyl Nausea And Vomiting   Codeine     Narcotic pain medicine makes her nauseated.  Says was told to only take tylenol due to kidney disease.   Levaquin [Levofloxacin In D5w] Nausea And Vomiting    Family History  Problem Relation Age of Onset   Breast  cancer Mother    Emphysema Father    COPD Sister    Atrial fibrillation Sister    Obesity Brother    Cirrhosis Daughter    COPD Son    Diabetes Brother    Asthma Brother      Prior to Admission medications   Medication Sig Start Date End Date Taking? Authorizing Provider  acetaminophen (TYLENOL) 500 MG tablet Take 1,000 mg by mouth daily as needed (pain). For pain   Yes [provider]   alendronate (FOSAMAX) 70 MG tablet Take 70 mg by mouth once a week. 04/08/20  Yes [provider]  allopurinol (ZYLOPRIM) 100 MG tablet Take 200 mg by mouth daily.   Yes [provider]  amLODipine (NORVASC) 5 MG tablet Take 5 mg by mouth daily.   Yes [provider]  budesonide-formoterol (SYMBICORT) 80-4.5 MCG/ACT inhaler Inhale 2 puffs into the lungs 2 (two) times daily.   Yes [provider]  Calcium Carbonate-Vitamin D (CALCIUM 600 + D PO) Take 1 tablet by mouth daily.   Yes [provider]  Cholecalciferol (VITAMIN D) 2000 UNITS CAPS Take 1 capsule by mouth every evening.   Yes [provider]  cyclobenzaprine (FLEXERIL) 5 MG tablet Take 1 tablet (5 mg total) by mouth at bedtime. 02/11/21  Yes Mordecai Rasmussen, MD  glimepiride (AMARYL) 2 MG tablet Take 2 mg by mouth daily with breakfast. 2 in the morning and 1 at bedtime   Yes [provider]  levothyroxine (SYNTHROID) 75 MCG tablet Take 75 mcg by mouth daily. 10/07/19  Yes [provider]  lisinopril (ZESTRIL) 5 MG tablet Take 5 mg by mouth daily. 12/03/20  Yes [provider]  loperamide (IMODIUM) 2 MG capsule Take 2 mg by mouth every morning.   Yes [provider]  meclizine (ANTIVERT) 25 MG tablet Take 1 tablet (25 mg total) by mouth 3 (three) times daily as needed for dizziness. 11/12/15  Yes Forde Dandy, MD  metoprolol succinate (TOPROL-XL) 50 MG 24 hr tablet Take 50 mg by mouth daily. 03/10/19  Yes [provider]  promethazine (PHENERGAN) 25 MG suppository Place 1 suppository (25 mg total) rectally every 6 (six) hours as needed for nausea or vomiting. 11/12/15  Yes Forde Dandy, MD  simvastatin (ZOCOR) 10 MG tablet Take 10 mg by mouth at bedtime.   Yes [provider]  sodium bicarbonate 650 MG tablet Take by mouth 3 (three) times daily. 02/04/21  Yes [provider]  albuterol (PROVENTIL) (2.5 MG/3ML) 0.083% nebulizer solution  Take 3 mLs (2.5 mg total) by nebulization every 4 (four) hours as needed for wheezing. Patient not taking: Reported on 02/24/2021 09/05/14   Samuella Cota, MD  albuterol (VENTOLIN HFA) 108 (90 BASE) MCG/ACT inhaler Inhale 2 puffs into the lungs every 4 (four) hours as needed for wheezing or shortness of breath. For rescue Patient not taking: Reported on 02/24/2021 09/05/14   Samuella Cota, MD  lisinopril (ZESTRIL) 2.5 MG tablet Take 2.5 mg by mouth daily. Patient not taking: No sig reported 02/27/20   [provider]  predniSONE (STERAPRED UNI-PAK 21 TAB) 10 MG (21) TBPK tablet 10 mg DS 12 as directed Patient not taking: No sig reported 02/04/21   Mordecai Rasmussen, MD    Physical Exam: BP (!) 110/98   Pulse 97   Temp 99.4 F (37.4 C) (Oral)   Resp 20   SpO2 90%   General: 80 y.o. year-old female well  developed well nourished in no acute distress.  Alert and oriented x3. HEENT: Dry mucous membranes.  NCAT, EOMI Neck: Supple, trachea medial Cardiovascular: Tachycardia.  Regular rate and rhythm with no rubs or gallops.  No thyromegaly or JVD noted.  No lower extremity edema. 2/4 pulses in all 4 extremities. Respiratory: Tachypnea.  Diffuse decreased breath sounds and some rhonchi in right lobes. Abdomen: Soft, nontender nondistended with normal bowel sounds x4 quadrants. Muskuloskeletal: No cyanosis, clubbing or edema noted bilaterally Neuro: CN II-XII intact, strength 5/5 x 4, sensation, reflexes intact Skin: No ulcerative lesions noted or rashes Psychiatry: Judgement and insight appear normal. Mood is appropriate for condition and setting          Labs on Admission:  Basic Metabolic Panel: Recent Labs  Lab 02/24/21 1818  NA 133*  K 3.5  CL 101  CO2 22  GLUCOSE 238*  BUN 21  CREATININE 1.82*  CALCIUM 7.8*   Liver Function Tests: Recent Labs  Lab 02/24/21 1818  AST 18  ALT 16  ALKPHOS 133*  BILITOT 1.1  PROT 5.8*  ALBUMIN 2.6*   No results for input(s):  LIPASE, AMYLASE in the last 168 hours. No results for input(s): AMMONIA in the last 168 hours. CBC: Recent Labs  Lab 02/24/21 1818  WBC 21.4*  NEUTROABS 18.5*  HGB 11.3*  HCT 34.1*  MCV 94.7  PLT 231   Cardiac Enzymes: No results for input(s): CKTOTAL, CKMB, CKMBINDEX, TROPONINI in the last 168 hours.  BNP (last 3 results) No results for input(s): BNP in the last 8760 hours.  ProBNP (last 3 results) No results for input(s): PROBNP in the last 8760 hours.  CBG: No results for input(s): GLUCAP in the last 168 hours.  Radiological Exams on Admission: DG Chest Port 1 View  Result Date: 02/24/2021 CLINICAL DATA:  Questionable sepsis, shortness of breath EXAM: PORTABLE CHEST 1 VIEW COMPARISON:  09/01/2014 FINDINGS: Patchy airspace disease throughout the right lung concerning for pneumonia. Left basilar scarring. Heart is borderline in size. No effusions or acute bony abnormality. IMPRESSION: Patchy right lung airspace disease concerning for pneumonia. Electronically Signed   By: Rolm Baptise M.D.   On: 02/24/2021 19:26    EKG: I independently viewed the EKG done and my findings are as followed: Sinus tachycardia at a rate of 114 bpm with RBBB  Assessment/Plan Present on Admission:  CAP (community acquired pneumonia)  Acute respiratory failure with hypoxia ([place])  Leukocytosis  Dehydration  Principal Problem:   CAP (community acquired pneumonia) Active Problems:   COPD (chronic obstructive pulmonary disease) ([facility])   Dehydration   Acute respiratory failure with hypoxia ([facility])   Chronic kidney disease   Leukocytosis   Sepsis ([place])   Hypoalbuminemia   Hypothyroidism   Hyperglycemia due to diabetes mellitus ([place])   Essential hypertension   Acute respiratory failure with hypoxia secondary to sepsis due to CAP POA Patient was tachycardic, tachypneic, WBC was elevated (21.4) and patient was febrile (met SIRS criteria) Chest x-ray was suggestive of pneumonia and patient now  requires supplemental oxygen (patient does not use oxygen at baseline)-met sepsis criteria PORT/PSI of 81 points  indicating 0.9-2.8% mortality Continue Mucinex, Robitussin Continue ceftriaxone, azithromycin with plan to de-escalate/discontinue antibiotics based on blood culture, sputum culture, urine Legionella, strep pneumo and procalcitonin Continue Tylenol 650 mg p.o. every 6 hours as needed  Continue  incentive spirometry, flutter valve   Leukocytosis possibly secondary to above Continue management as described above  Hypoalbuminemia possibly secondary to moderate protein  calorie malnutrition Albumin 2.6; protein supplement will be provided  COPD Continue albuterol, Dulera  Essential hypertension Continue Norvasc, Toprol-XL  Hyperglycemia secondary to T2DM SSI and hypoglycemic protocol Glimepiride will be held at this time  CKD stage IV BUN to creatinine 31/1.82 (creatinine within last year ranged between 1.4-2.0) Renally adjust medications, avoid nephrotoxic agents/dehydration/hypotension  Dehydration Continue gentle hydration  Hypothyroidism Continue Synthroid  Hyperlipidemia Statin will be held at this time due to elevated ALP  DVT prophylaxis: Lovenox  Code Status: Full code  Family Communication: Friend at bedside (all questions answered to satisfaction)  Disposition Plan:  Patient is from:                        home Anticipated DC to:                   SNF or family members home Anticipated DC date:               2-3 days Anticipated DC barriers:          Patient requires inpatient management due to respiratory failure with hypoxia secondary to sepsis due to CAP    Consults called: None  Admission status: Inpatient    Bernadette Hoit MD [facility]  02/24/2021, 10:48 PM

## 2021-02-24 NOTE — ED Provider Notes (Signed)
[facility] EMERGENCY DEPARTMENT Provider Note   CSN: [id number] Arrival date & time: 02/24/21  1749     History Chief Complaint  Patient presents with   Shortness of Breath    Lindsay Cortez is a 80 y.o. female with PMHx HTN, Diabetes, COPD who presents to the ED today with complaint of gradual onset, constant, worsening, shortness of breath over the past 4 days. Pt also complains of fatigue and a dry cough. She was unaware she had a fever until she came to the ED today. She denies any recent sick contacts. She is vaccinated for COVID x 3, has not received the 4th booster. Pt denies any headache, sore throat, abdominal pain, nausea, vomiting, diarrhea, urinary symptoms, or any other associated symptoms.   The history is provided by the patient and medical records.      Past Medical History:  Diagnosis Date   Arthritis    Asthma    Back pain    COPD (chronic obstructive pulmonary disease) ([place])    Diabetes ([place]) 09/01/2014   Diabetes mellitus    x 15 yrs   Gout    Hypertension    Kidney stones    Pancreatic cyst    Pneumonia    Renal disorder    Shoulder dislocation    Vertigo     Patient Active Problem List   Diagnosis Date Noted   CAP (community acquired pneumonia) 02/24/2021   Leukocytosis 07/11/2019   Acute respiratory failure with hypoxia ([place]) 09/02/2014   CKD (chronic kidney disease) stage 3, GFR 30-59 ml/min (H[facility]) 09/02/2014   Nausea and vomiting 09/01/2014   COPD exacerbation ([place]) 09/01/2014   Dehydration 09/01/2014   Hypoxia 09/01/2014   Diabetes ([place]) 09/01/2014   Acute exacerbation of COPD with asthma ([place]) 09/01/2014   Pancreatic mass 05/19/2014   Common bile duct calculi 05/19/2014   Right knee pain 09/18/2013   Lateral meniscus derangement 09/18/2013   Arthritis of right knee 09/18/2013    Past Surgical History:  Procedure Laterality Date   ABDOMINAL HYSTERECTOMY     BLADDER SURGERY     bladder tac   BREAST SURGERY     benign   FOOT  SURGERY       OB History     Gravida  2   Para  2   Term  2   Preterm      AB      Living  2      SAB      IAB      Ectopic      Multiple      Live Births              Family History  Problem Relation Age of Onset   Breast cancer Mother    Emphysema Father    COPD Sister    Atrial fibrillation Sister    Obesity Brother    Cirrhosis Daughter    COPD Son    Diabetes Brother    Asthma Brother     Social History   Tobacco Use   Smoking status: Never   Smokeless tobacco: Never  Substance Use Topics   Alcohol use: No    Alcohol/week: 0.0 standard drinks   Drug use: No    Home Medications Prior to Admission medications   Medication Sig Start Date End Date Taking? Authorizing Provider  acetaminophen (TYLENOL) 500 MG tablet Take 1,000 mg by mouth daily as needed (pain). For pain   Yes [provider]  alendronate (FOSAMAX) 70 MG tablet Take 70 mg by mouth once a week. 04/08/20  Yes [provider]  allopurinol (ZYLOPRIM) 100 MG tablet Take 200 mg by mouth daily.   Yes [provider]  amLODipine (NORVASC) 5 MG tablet Take 5 mg by mouth daily.   Yes [provider]  budesonide-formoterol (SYMBICORT) 80-4.5 MCG/ACT inhaler Inhale 2 puffs into the lungs 2 (two) times daily.   Yes [provider]  Calcium Carbonate-Vitamin D (CALCIUM 600 + D PO) Take 1 tablet by mouth daily.   Yes [provider]  Cholecalciferol (VITAMIN D) 2000 UNITS CAPS Take 1 capsule by mouth every evening.   Yes [provider]  cyclobenzaprine (FLEXERIL) 5 MG tablet Take 1 tablet (5 mg total) by mouth at bedtime. 02/11/21  Yes Mordecai Rasmussen, MD  glimepiride (AMARYL) 2 MG tablet Take 2 mg by mouth daily with breakfast. 2 in the morning and 1 at bedtime   Yes [provider]  levothyroxine (SYNTHROID) 75 MCG tablet Take 75 mcg by mouth daily. 10/07/19  Yes [provider]  lisinopril (ZESTRIL) 5 MG tablet Take  5 mg by mouth daily. 12/03/20  Yes [provider]  loperamide (IMODIUM) 2 MG capsule Take 2 mg by mouth every morning.   Yes [provider]  meclizine (ANTIVERT) 25 MG tablet Take 1 tablet (25 mg total) by mouth 3 (three) times daily as needed for dizziness. 11/12/15  Yes Forde Dandy, MD  metoprolol succinate (TOPROL-XL) 50 MG 24 hr tablet Take 50 mg by mouth daily. 03/10/19  Yes [provider]  promethazine (PHENERGAN) 25 MG suppository Place 1 suppository (25 mg total) rectally every 6 (six) hours as needed for nausea or vomiting. 11/12/15  Yes Forde Dandy, MD  simvastatin (ZOCOR) 10 MG tablet Take 10 mg by mouth at bedtime.   Yes [provider]  sodium bicarbonate 650 MG tablet Take by mouth 3 (three) times daily. 02/04/21  Yes [provider]  albuterol (PROVENTIL) (2.5 MG/3ML) 0.083% nebulizer solution Take 3 mLs (2.5 mg total) by nebulization every 4 (four) hours as needed for wheezing. Patient not taking: Reported on 02/24/2021 09/05/14   Samuella Cota, MD  albuterol (VENTOLIN HFA) 108 (90 BASE) MCG/ACT inhaler Inhale 2 puffs into the lungs every 4 (four) hours as needed for wheezing or shortness of breath. For rescue Patient not taking: Reported on 02/24/2021 09/05/14   Samuella Cota, MD  lisinopril (ZESTRIL) 2.5 MG tablet Take 2.5 mg by mouth daily. Patient not taking: No sig reported 02/27/20   [provider]  predniSONE (STERAPRED UNI-PAK 21 TAB) 10 MG (21) TBPK tablet 10 mg DS 12 as directed Patient not taking: No sig reported 02/04/21   Mordecai Rasmussen, MD    Allergies    Fentanyl, Codeine, and Levaquin [levofloxacin in d5w]  Review of Systems   Review of Systems  Constitutional:  Positive for fatigue and fever. Negative for chills.  Respiratory:  Positive for cough and shortness of breath.   Cardiovascular:  Positive for chest pain.  Gastrointestinal:  Negative for abdominal pain, diarrhea, nausea and vomiting.   Genitourinary:  Negative for dysuria, flank pain and frequency.  All other systems reviewed and are negative.  Physical Exam Updated Vital Signs BP (!) 141/64   Pulse (!) 105   Temp (!) 102.4 F (39.1 C) (Oral)   Resp 17   SpO2 94%   Physical Exam Vitals and nursing note reviewed.  Constitutional:      Appearance: She is not ill-appearing or diaphoretic.  HENT:     Head: Normocephalic and atraumatic.  Eyes:     Conjunctiva/sclera: Conjunctivae normal.  Cardiovascular:     Rate and Rhythm: Regular rhythm. Tachycardia present.     Pulses: Normal pulses.  Pulmonary:     Effort: Tachypnea present.     Breath sounds: Decreased breath sounds present.     Comments: Currently on 2L [place] satting 92%. Tachypneic but able to speak in full sentences. Diminished breath sounds throughout.  Abdominal:     Palpations: Abdomen is soft.     Tenderness: There is no abdominal tenderness.  Musculoskeletal:     Cervical back: Neck supple.  Skin:    General: Skin is warm and dry.  Neurological:     Mental Status: She is alert.    ED Results / Procedures / Treatments   Labs (all labs ordered are listed, but only abnormal results are displayed) Labs Reviewed  COMPREHENSIVE METABOLIC PANEL - Abnormal; Notable for the following components:      Result Value   Sodium 133 (*)    Glucose, Bld 238 (*)    Creatinine, Ser 1.82 (*)    Calcium 7.8 (*)    Total Protein 5.8 (*)    Albumin 2.6 (*)    Alkaline Phosphatase 133 (*)    GFR, Estimated 28 (*)    All other components within normal limits  CBC WITH DIFFERENTIAL/PLATELET - Abnormal; Notable for the following components:   WBC 21.4 (*)    RBC 3.60 (*)    Hemoglobin 11.3 (*)    HCT 34.1 (*)    Neutro Abs 18.5 (*)    Lymphs Abs 0.5 (*)    Monocytes Absolute 1.9 (*)    Abs Immature Granulocytes 0.32 (*)    All other components within normal limits  RESP PANEL BY RT-PCR (FLU A&B, COVID) ARPGX2  CULTURE, BLOOD (ROUTINE X 2)  CULTURE,  BLOOD (ROUTINE X 2)  URINE CULTURE  LACTIC ACID, PLASMA  PROTIME-INR  APTT  LACTIC ACID, PLASMA  URINALYSIS, ROUTINE W REFLEX MICROSCOPIC    EKG None  Radiology DG Chest Port 1 View  Result Date: 02/24/2021 CLINICAL DATA:  Questionable sepsis, shortness of breath EXAM: PORTABLE CHEST 1 VIEW COMPARISON:  09/01/2014 FINDINGS: Patchy airspace disease throughout the right lung concerning for pneumonia. Left basilar scarring. Heart is borderline in size. No effusions or acute bony abnormality. IMPRESSION: Patchy right lung airspace disease concerning for pneumonia. Electronically Signed   By: Rolm Baptise M.D.   On: 02/24/2021 19:26    Procedures .Critical Care  Date/Time: 02/24/2021 7:57 PM Performed by: Eustaquio Maize, PA-C Authorized by: Eustaquio Maize, PA-C   Critical care provider statement:    Critical care time (minutes):  45   Critical care was necessary to treat or prevent imminent or life-threatening deterioration of the following conditions:  Sepsis   Critical care was time spent personally by me on the following activities:  Discussions with consultants, evaluation of patient's response to treatment, examination of patient, ordering and performing treatments and interventions, ordering and review of laboratory studies, ordering and review of radiographic studies, pulse oximetry, re-evaluation of patient's condition, obtaining history from patient or surrogate and review of old charts   Medications Ordered in ED Medications  0.9 %  sodium chloride infusion (has no administration in time range)  cefTRIAXone (ROCEPHIN) 2 g in sodium chloride 0.9 % 100 mL IVPB (0 g Intravenous Stopped  02/24/21 1921)  azithromycin (ZITHROMAX) 500 mg in sodium chloride 0.9 % 250 mL IVPB (500 mg Intravenous New Bag/Given 02/24/21 1832)  sodium chloride 0.9 % bolus 1,000 mL (1,000 mLs Intravenous New Bag/Given 02/24/21 1828)  acetaminophen (TYLENOL) tablet 650 mg (650 mg Oral Given 02/24/21 1833)     ED Course  I have reviewed the triage vital signs and the nursing notes.  Pertinent labs & imaging results that were available during my care of the patient were reviewed by me and considered in my medical decision making (see chart for details).  Clinical Course as of 02/24/21 1957  Thu Feb 24, 2021  1911 WBC(!): 21.4 [MV]  1911 Abs Immature Granulocytes(!): 0.32 [MV]    Clinical Course User Index [MV] Eustaquio Maize, PA-C   MDM Rules/Calculators/A&P                          80 year old female who presents to the ED today with complaint of worsening shortness of breath over the past 4 days with dry cough.  On arrival to the ED today patient is found to be febrile at 102.4 which she was unaware of.  She is also noted to be tachycardic, tachypneic, hypoxic with O2 sats of 88%.  Not typically on oxygen.  Placed on 2 L and brought back to her room.  She denies any recent sick contacts and is vaccinated x3.  On my exam she is speaking in full sentences however she has diminished breath sounds.  She denies any urinary symptoms or other infectious type symptoms at this time.  Suspect pulmonary etiology, pneumonia versus possible COVID.  Currently meets code sepsis.  Septic labs ordered and started on Rocephin and Zithromax.  Will require admission.   CBC with leukocytosis 21,400 with left shift. Hgb stable  at 11.3 CMP with creatinine 1.82 which does appear to be pt's baseline. BUN WNL. Glucose 238, bicarb 22, no gap.   Lab Results  Component Value Date   CREATININE 1.82 (H) 02/24/2021   CREATININE 2.03 (H) 12/23/2019   CREATININE 1.60 (H) 07/11/2019   Lactic acid WNL COVID and flu negative  CXR: IMPRESSION:  Patchy right lung airspace disease concerning for pneumonia.   Discussed case with [facility] Dr. Josephine Cables who agrees to accept patient for admission.   This note was prepared using Dragon voice recognition software and may include unintentional dictation errors due to  the inherent limitations of voice recognition software.  Final Clinical Impression(s) / ED Diagnoses Final diagnoses:  Sepsis without acute organ dysfunction, due to unspecified organism [facility])  Community acquired pneumonia of right lower lobe of lung    Rx / DC Orders ED Discharge Orders     None        Eustaquio Maize, PA-C 02/24/21 Starlyn Skeans, MD 02/25/21 1038

## 2021-02-25 DIAGNOSIS — A419 Sepsis, unspecified organism: Principal | ICD-10-CM

## 2021-02-25 LAB — URINALYSIS, ROUTINE W REFLEX MICROSCOPIC
Bacteria, UA: NONE SEEN
Bilirubin Urine: NEGATIVE
Glucose, UA: 50 mg/dL — AB
Ketones, ur: 5 mg/dL — AB
Leukocytes,Ua: NEGATIVE
Nitrite: NEGATIVE
Protein, ur: >=300 mg/dL — AB
Specific Gravity, Urine: 1.013 (ref 1.005–1.030)
pH: 6 (ref 5.0–8.0)

## 2021-02-25 LAB — COMPREHENSIVE METABOLIC PANEL
ALT: 12 U/L (ref 0–44)
AST: 14 U/L — ABNORMAL LOW (ref 15–41)
Albumin: 2.1 g/dL — ABNORMAL LOW (ref 3.5–5.0)
Alkaline Phosphatase: 107 U/L (ref 38–126)
Anion gap: 8 (ref 5–15)
BUN: 17 mg/dL (ref 8–23)
CO2: 21 mmol/L — ABNORMAL LOW (ref 22–32)
Calcium: 6.8 mg/dL — ABNORMAL LOW (ref 8.9–10.3)
Chloride: 107 mmol/L (ref 98–111)
Creatinine, Ser: 1.69 mg/dL — ABNORMAL HIGH (ref 0.44–1.00)
GFR, Estimated: 30 mL/min — ABNORMAL LOW (ref >60)
Glucose, Bld: 152 mg/dL — ABNORMAL HIGH (ref 70–99)
Potassium: 3.4 mmol/L — ABNORMAL LOW (ref 3.5–5.1)
Sodium: 136 mmol/L (ref 135–145)
Total Bilirubin: 0.6 mg/dL (ref 0.3–1.2)
Total Protein: 5 g/dL — ABNORMAL LOW (ref 6.5–8.1)

## 2021-02-25 LAB — CBC
HCT: 31 % — ABNORMAL LOW (ref 36.0–46.0)
Hemoglobin: 10 g/dL — ABNORMAL LOW (ref 12.0–15.0)
MCH: 31.5 pg (ref 26.0–34.0)
MCHC: 32.3 g/dL (ref 30.0–36.0)
MCV: 97.8 fL (ref 80.0–100.0)
Platelets: 188 10*3/uL (ref 150–400)
RBC: 3.17 MIL/uL — ABNORMAL LOW (ref 3.87–5.11)
RDW: 14.5 % (ref 11.5–15.5)
WBC: 20.5 10*3/uL — ABNORMAL HIGH (ref 4.0–10.5)
nRBC: 0 % (ref 0.0–0.2)

## 2021-02-25 LAB — GLUCOSE, CAPILLARY
Glucose-Capillary: 226 mg/dL — ABNORMAL HIGH (ref 70–99)
Glucose-Capillary: 162 mg/dL — ABNORMAL HIGH (ref 70–99)
Glucose-Capillary: 208 mg/dL — ABNORMAL HIGH (ref 70–99)

## 2021-02-25 LAB — STREP PNEUMONIAE URINARY ANTIGEN: Strep Pneumo Urinary Antigen: NEGATIVE

## 2021-02-25 LAB — PROCALCITONIN: Procalcitonin: 12.39 ng/mL

## 2021-02-25 LAB — PHOSPHORUS: Phosphorus: 3.4 mg/dL (ref 2.5–4.6)

## 2021-02-25 LAB — PROTIME-INR
INR: 1.2 (ref 0.8–1.2)
Prothrombin Time: 15.5 seconds — ABNORMAL HIGH (ref 11.4–15.2)

## 2021-02-25 LAB — CBG MONITORING, ED: Glucose-Capillary: 145 mg/dL — ABNORMAL HIGH (ref 70–99)

## 2021-02-25 LAB — APTT: aPTT: 33 seconds (ref 24–36)

## 2021-02-25 LAB — MAGNESIUM: Magnesium: 1.7 mg/dL (ref 1.7–2.4)

## 2021-02-25 MED ORDER — LEVOTHYROXINE SODIUM 75 MCG PO TABS
75.0000 ug | ORAL_TABLET | Freq: Every day | ORAL | Status: DC
  Administered 2021-02-25 – 2021-03-03 (×7): 75 ug via ORAL
  Filled 2021-02-25: qty 2
  Filled 2021-02-25: qty 1
  Filled 2021-02-25: qty 3
  Filled 2021-02-25 (×4): qty 1

## 2021-02-25 MED ORDER — ALBUTEROL SULFATE (2.5 MG/3ML) 0.083% IN NEBU
3.0000 mL | INHALATION_SOLUTION | RESPIRATORY_TRACT | Status: DC | PRN
  Filled 2021-02-25 (×3): qty 3

## 2021-02-25 MED ORDER — VITAMIN D 25 MCG (1000 UNIT) PO TABS
2000.0000 [IU] | ORAL_TABLET | Freq: Every evening | ORAL | Status: DC
  Administered 2021-02-25 – 2021-03-02 (×6): 2000 [IU] via ORAL
  Filled 2021-02-25 (×7): qty 2

## 2021-02-25 MED ORDER — GUAIFENESIN ER 600 MG PO TB12: 600.0000 mg | ORAL_TABLET | Freq: Two times a day (BID) | ORAL | Status: DC

## 2021-02-25 MED ORDER — ALBUTEROL SULFATE (2.5 MG/3ML) 0.083% IN NEBU
2.5000 mg | INHALATION_SOLUTION | RESPIRATORY_TRACT | Status: DC | PRN
  Administered 2021-02-25 (×3): 2.5 mg via RESPIRATORY_TRACT
  Filled 2021-02-25: qty 3

## 2021-02-25 MED ORDER — METOPROLOL SUCCINATE ER 50 MG PO TB24
50.0000 mg | ORAL_TABLET | Freq: Every day | ORAL | Status: DC
  Administered 2021-02-25 – 2021-03-03 (×7): 50 mg via ORAL
  Filled 2021-02-25 (×7): qty 1

## 2021-02-25 MED ORDER — MOMETASONE FURO-FORMOTEROL FUM 100-5 MCG/ACT IN AERO
2.0000 | INHALATION_SPRAY | Freq: Two times a day (BID) | RESPIRATORY_TRACT | Status: DC
  Administered 2021-02-25 – 2021-03-03 (×12): 2 via RESPIRATORY_TRACT
  Filled 2021-02-25 (×2): qty 8.8

## 2021-02-25 MED ORDER — AMLODIPINE BESYLATE 5 MG PO TABS
5.0000 mg | ORAL_TABLET | Freq: Every day | ORAL | Status: DC
  Administered 2021-02-25 – 2021-03-03 (×7): 5 mg via ORAL
  Filled 2021-02-25 (×7): qty 1

## 2021-02-25 MED ORDER — POTASSIUM CHLORIDE CRYS ER 20 MEQ PO TBCR
40.0000 meq | EXTENDED_RELEASE_TABLET | ORAL | Status: AC
  Administered 2021-02-25 (×2): 40 meq via ORAL
  Filled 2021-02-25 (×2): qty 2

## 2021-02-25 MED ORDER — METHYLPREDNISOLONE SODIUM SUCC 40 MG IJ SOLR
40.0000 mg | Freq: Two times a day (BID) | INTRAMUSCULAR | Status: DC
  Administered 2021-02-25 – 2021-03-03 (×12): 40 mg via INTRAVENOUS
  Filled 2021-02-25 (×12): qty 1

## 2021-02-25 MED ORDER — ACETAMINOPHEN 325 MG PO TABS: 650.0000 mg | ORAL_TABLET | Freq: Four times a day (QID) | ORAL | Status: DC | PRN

## 2021-02-25 NOTE — ED Notes (Signed)
Pt extremely dyspneic when up to bedside toilet.  Saturations after exertion 83% on 4lbnc.  After rest pt improved to 91-93%.

## 2021-02-25 NOTE — Progress Notes (Signed)
Patient Demographics:    Lindsay Cortez, is a 80 y.o. female, DOB - [birth year], ONG:[id number]  Admit date - 02/24/2021   Admitting Physician Bernadette Hoit, DO  Outpatient Primary MD for the patient is Redmond School, MD  LOS - 1  Chief Complaint  Patient presents with   Shortness of Breath        Subjective:    [name] today has no emesis,  No chest pain,   -Cough and dyspnea persist, fever appears to be resolving  Assessment  & Plan :    Principal Problem:   Sepsis due to community-acquired pneumonia Active Problems:   Acute respiratory failure with hypoxia ([place])   CAP (community acquired pneumonia)   COPD (chronic obstructive pulmonary disease) ([place])   Chronic kidney disease   Dehydration   Leukocytosis   Hypoalbuminemia   Hypothyroidism   Hyperglycemia due to diabetes mellitus [facility])   Essential hypertension  Brief Summary:-  80 y.o. female with medical history significant for hypertension, COPD, T2DM, hypothyroidism, hyperlipidemia, CKD stage IV admitted on 02/24/2021 with sepsis going to: COVID-pneumonia resulting in acute hypoxic respiratory failure  A/p 1) sepsis secondary to community-acquired pneumonia- POA -Patient met sepsis criteria on admission with fevers above 102, tachycardia with heart rate of 115, leukocytosis with WBC over 20 K, tachypnea with respiratory rate over 29, hypoxia with O2 sats of 87%, -PCT 12.39 -Chest x-ray consistent with right-sided pneumonia -Continue IV Rocephin and azithromycin, mucolytics and bronchodilators as ordered -Lactic acid 1.9, repeat 1.2`  2)CAP--treat as above #1  3)Acute hypoxic respiratory failure--secondary to #1 and #2 above  4)DM2-continue to hold glimepiride Use Novolog/Humalog Sliding scale insulin with Accu-Cheks/Fingersticks as ordered   5)AKI----acute kidney injury on CKD stage -  IV -Creatinine is down to 1.69 from  1.82 on admission  renally adjust medications, avoid nephrotoxic agents / dehydration  / hypotension  6) hypothyroidism--continue levothyroxine  7) acute COPD exacerbation--- due to #1 #2 above, -Manage as above #1 #2 -Solu-Medrol as ordered  8)HTN-okay to resume Toprol-XL and amlodipine  Disposition/Need for in-Hospital Stay- patient unable to be discharged at this time due to --sepsis secondary to pneumonia requiring IV antibiotics and IV fluids as well as supplemental oxygen due to acute hypoxic respiratory failure  Status is: Inpatient  Remains inpatient appropriate because: Please see disposition above  Disposition: The patient is from: Home              Anticipated d/c is to: Home              Anticipated d/c date is: 2 days              Patient currently is not medically stable to d/c. Barriers: Not Clinically Stable-   Code Status : -  Code Status: Full Code   Family Communication:    NA (patient is alert, awake and coherent)   Consults  :  na  DVT Prophylaxis  :   - SCDs   enoxaparin (LOVENOX) injection 30 mg Start: 02/25/21 0800 SCDs Start: 02/24/21 2053    Lab Results  Component Value Date   PLT 188 02/25/2021    Inpatient Medications  Scheduled Meds:  amLODipine  5 mg Oral Daily   cholecalciferol  2,000 Units  Oral QPM   dextromethorphan-guaiFENesin  1 tablet Oral BID   enoxaparin (LOVENOX) injection  30 mg Subcutaneous Q24H   insulin aspart  0-5 Units Subcutaneous QHS   insulin aspart  0-9 Units Subcutaneous TID WC   levothyroxine  75 mcg Oral Q0600   metoprolol succinate  50 mg Oral Daily   mometasone-formoterol  2 puff Inhalation BID   Continuous Infusions:  azithromycin 500 mg (02/25/21 1818)   cefTRIAXone (ROCEPHIN)  IV 2 g (02/25/21 1823)   PRN Meds:.acetaminophen, albuterol, albuterol, guaiFENesin-dextromethorphan    Anti-infectives (From admission, onward)    Start     Dose/Rate Route Frequency Ordered Stop   02/24/21 1830  cefTRIAXone  (ROCEPHIN) 2 g in sodium chloride 0.9 % 100 mL IVPB        2 g 200 mL/hr over 30 Minutes Intravenous Every 24 hours 02/24/21 1818     02/24/21 1830  azithromycin (ZITHROMAX) 500 mg in sodium chloride 0.9 % 250 mL IVPB        500 mg 250 mL/hr over 60 Minutes Intravenous Every 24 hours 02/24/21 1818           Objective:   Vitals:   02/25/21 0830 02/25/21 1002 02/25/21 1408 02/25/21 1717  BP: (!) 116/52 110/65 (!) 126/56 126/75  Pulse: (!) 101 (!) 105 91 96  Resp: _0 Temp:  98.7 F (37.1 C) 98.7 F (37.1 C) 98.5 F (36.9 C)  TempSrc:  Oral Oral Oral  SpO2: 93% 90% 90% (!) 89%  Weight:  63.8 kg    Height:  4' 7" (1.397 m)      Wt Readings from Last 3 Encounters:  02/25/21 63.8 kg  02/11/21 61.3 kg  01/28/21 63.6 kg     Intake/Output Summary (Last 24 hours) at 02/25/2021 1859 Last data filed at 02/25/2021 1800 Gross per 24 hour  Intake 1530 ml  Output --  Net 1530 ml     Physical Exam  Gen:- Awake Alert, ill-appearing, conversational dyspnea HEENT:- [place].AT, No sclera icterus Nose0 [place] 2L/min Neck-Supple Neck,No JVD,.  Lungs- -diminished breath sounds with scattered rhonchi bilaterally CV- S1, S2 normal, regular  Abd-  +ve B.Sounds, Abd Soft, No tenderness,    Extremity/Skin:- No  edema, pedal pulses present  Psych-affect is appropriate, oriented x3 Neuro-generalized weakness, no new focal deficits, no tremors   Data Review:   Micro Results Recent Results (from the past 240 hour(s))  Resp Panel by RT-PCR (Flu A&B, Covid) Nasopharyngeal Swab     Status: None   Collection Time: 02/24/21  6:18 PM   Specimen: Nasopharyngeal Swab; Nasopharyngeal(NP) swabs in vial transport medium  Result Value Ref Range Status   SARS Coronavirus 2 by RT PCR NEGATIVE NEGATIVE Final    Comment: (NOTE) SARS-CoV-2 target nucleic acids are NOT DETECTED.  The SARS-CoV-2 RNA is generally detectable in upper respiratory specimens during the acute phase of infection. The  lowest concentration of SARS-CoV-2 viral copies this assay can detect is 138 copies/mL. A negative result does not preclude SARS-Cov-2 infection and should not be used as the sole basis for treatment or other patient management decisions. A negative result may occur with  improper specimen collection/handling, submission of specimen other than nasopharyngeal swab, presence of viral mutation(s) within the areas targeted by this assay, and inadequate number of viral copies(<138 copies/mL). A negative result must be combined with clinical observations, patient history, and epidemiological information. The expected result is Negative.  Fact Sheet for Patients:  EntrepreneurPulse.com.au  Fact Sheet for Healthcare Providers:  https://www.fda.gov/media/152162/download  This test is no t yet approved or cleared by the [place] FDA and  has been authorized for detection and/or diagnosis of SARS-CoV-2 by FDA under an Emergency Use Authorization (EUA). This EUA will remain  in effect (meaning this test can be used) for the duration of the COVID-19 declaration under Section 564(b)(1) of the Act, 21 U.S.C.section 360bbb-3(b)(1), unless the authorization is terminated  or revoked sooner.       Influenza A by PCR NEGATIVE NEGATIVE Final   Influenza B by PCR NEGATIVE NEGATIVE Final    Comment: (NOTE) The Xpert Xpress SARS-CoV-2/FLU/RSV plus assay is intended as an aid in the diagnosis of influenza from Nasopharyngeal swab specimens and should not be used as a sole basis for treatment. Nasal washings and aspirates are unacceptable for Xpert Xpress SARS-CoV-2/FLU/RSV testing.  Fact Sheet for Patients: https://www.fda.gov/media/152166/download  Fact Sheet for Healthcare Providers: https://www.fda.gov/media/152162/download  This test is not yet approved or cleared by the [place] FDA and has been authorized for detection and/or diagnosis of SARS-CoV-2 by FDA under  an Emergency Use Authorization (EUA). This EUA will remain in effect (meaning this test can be used) for the duration of the COVID-19 declaration under Section 564(b)(1) of the Act, 21 U.S.C. section 360bbb-3(b)(1), unless the authorization is terminated or revoked.  Performed at [facility], [address] [place], [place] [address]   Blood Culture (routine x 2)     Status: None (Preliminary result)   Collection Time: 02/24/21  6:18 PM   Specimen: Right Antecubital; Blood  Result Value Ref Range Status   Specimen Description   Final    RIGHT ANTECUBITAL BOTTLES DRAWN AEROBIC AND ANAEROBIC   Special Requests Blood Culture adequate volume  Final   Culture   Final    NO GROWTH < 24 HOURS Performed at [facility], [address] [place], [place] [address]    Report Status PENDING  Incomplete  Blood Culture (routine x 2)     Status: None (Preliminary result)   Collection Time: 02/24/21  6:23 PM   Specimen: BLOOD RIGHT WRIST  Result Value Ref Range Status   Specimen Description   Final    BLOOD RIGHT WRIST BOTTLES DRAWN AEROBIC AND ANAEROBIC   Special Requests Blood Culture adequate volume  Final   Culture   Final    NO GROWTH < 24 HOURS Performed at [facility], [address] [place], [place] [address]    Report Status PENDING  Incomplete    Radiology Reports DG Chest Port 1 View  Result Date: 02/24/2021 CLINICAL DATA:  Questionable sepsis, shortness of breath EXAM: PORTABLE CHEST 1 VIEW COMPARISON:  09/01/2014 FINDINGS: Patchy airspace disease throughout the right lung concerning for pneumonia. Left basilar scarring. Heart is borderline in size. No effusions or acute bony abnormality. IMPRESSION: Patchy right lung airspace disease concerning for pneumonia. Electronically Signed   By: Kevin  Dover M.D.   On: 02/24/2021 19:26     CBC Recent Labs  Lab 02/24/21 1818 02/25/21 0349  WBC 21.4* 20.5*  HGB 11.3* 10.0*  HCT 34.1* 31.0*  PLT 231 188  MCV 94.7 97.8  MCH 31.4  31.5  MCHC 33.1 32.3  RDW 14.5 14.5  LYMPHSABS 0.5*  --   MONOABS 1.9*  --   EOSABS 0.0  --   BASOSABS 0.1  --     Chemistries  Recent Labs  Lab 02/24/21 1818 02/25/21 0349  NA 133* 136  K 3.5   3.4*  CL 101 107  CO2 22 21*  GLUCOSE 238* 152*  BUN 21 17  CREATININE 1.82* 1.69*  CALCIUM 7.8* 6.8*  MG  --  1.7  AST 18 14*  ALT 16 12  ALKPHOS 133* 107  BILITOT 1.1 0.6   ------------------------------------------------------------------------------------------------------------------ No results for input(s): CHOL, HDL, LDLCALC, TRIG, CHOLHDL, LDLDIRECT in the last 72 hours.  Lab Results  Component Value Date   HGBA1C 7.0 (H) 09/02/2014   ------------------------------------------------------------------------------------------------------------------ No results for input(s): TSH, T4TOTAL, T3FREE, THYROIDAB in the last 72 hours.  Invalid input(s): FREET3 ------------------------------------------------------------------------------------------------------------------ No results for input(s): VITAMINB12, FOLATE, FERRITIN, TIBC, IRON, RETICCTPCT in the last 72 hours.  Coagulation profile Recent Labs  Lab 02/24/21 1818 02/25/21 0349  INR 1.1 1.2    No results for input(s): DDIMER in the last 72 hours.  Cardiac Enzymes No results for input(s): CKMB, TROPONINI, MYOGLOBIN in the last 168 hours.  Invalid input(s): CK ------------------------------------------------------------------------------------------------------------------ No results found for: BNP   Roxan Hockey M.D on 02/25/2021 at 6:59 PM  Go to [email or web address] - for contact info  [facility] - Office  [phone]

## 2021-02-25 NOTE — Care Management Important Message (Signed)
Important Message  Patient Details  Name: Lindsay Cortez MRN: [id number] Date of Birth: Oct 09, 1940   Medicare Important Message Given:  Yes     Tommy Medal 02/25/2021, 1:22 PM

## 2021-02-25 NOTE — ED Notes (Addendum)
Pt states work of breathing has improved but still feels slightly short of breath.    Respiratory notified for neb

## 2021-02-26 LAB — CBC
HCT: 32.3 % — ABNORMAL LOW (ref 36.0–46.0)
Hemoglobin: 10.3 g/dL — ABNORMAL LOW (ref 12.0–15.0)
MCH: 31 pg (ref 26.0–34.0)
MCHC: 31.9 g/dL (ref 30.0–36.0)
MCV: 97.3 fL (ref 80.0–100.0)
Platelets: 234 10*3/uL (ref 150–400)
RBC: 3.32 MIL/uL — ABNORMAL LOW (ref 3.87–5.11)
RDW: 14.8 % (ref 11.5–15.5)
WBC: 11.2 10*3/uL — ABNORMAL HIGH (ref 4.0–10.5)
nRBC: 0 % (ref 0.0–0.2)

## 2021-02-26 LAB — COMPREHENSIVE METABOLIC PANEL
ALT: 17 U/L (ref 0–44)
AST: 25 U/L (ref 15–41)
Albumin: 2.2 g/dL — ABNORMAL LOW (ref 3.5–5.0)
Alkaline Phosphatase: 149 U/L — ABNORMAL HIGH (ref 38–126)
Anion gap: 11 (ref 5–15)
BUN: 20 mg/dL (ref 8–23)
CO2: 18 mmol/L — ABNORMAL LOW (ref 22–32)
Calcium: 7.6 mg/dL — ABNORMAL LOW (ref 8.9–10.3)
Chloride: 107 mmol/L (ref 98–111)
Creatinine, Ser: 1.76 mg/dL — ABNORMAL HIGH (ref 0.44–1.00)
GFR, Estimated: 29 mL/min — ABNORMAL LOW (ref >60)
Glucose, Bld: 337 mg/dL — ABNORMAL HIGH (ref 70–99)
Potassium: 5.2 mmol/L — ABNORMAL HIGH (ref 3.5–5.1)
Sodium: 136 mmol/L (ref 135–145)
Total Bilirubin: 0.4 mg/dL (ref 0.3–1.2)
Total Protein: 5.7 g/dL — ABNORMAL LOW (ref 6.5–8.1)

## 2021-02-26 LAB — LEGIONELLA PNEUMOPHILA SEROGP 1 UR AG: L. pneumophila Serogp 1 Ur Ag: NEGATIVE

## 2021-02-26 LAB — GLUCOSE, CAPILLARY
Glucose-Capillary: 338 mg/dL — ABNORMAL HIGH (ref 70–99)
Glucose-Capillary: 354 mg/dL — ABNORMAL HIGH (ref 70–99)
Glucose-Capillary: 323 mg/dL — ABNORMAL HIGH (ref 70–99)
Glucose-Capillary: 290 mg/dL — ABNORMAL HIGH (ref 70–99)

## 2021-02-26 LAB — HEMOGLOBIN A1C
Hgb A1c MFr Bld: 9.8 % — ABNORMAL HIGH (ref 4.8–5.6)
Mean Plasma Glucose: 235 mg/dL

## 2021-02-26 MED ORDER — LOPERAMIDE HCL 2 MG PO CAPS
2.0000 mg | ORAL_CAPSULE | Freq: Four times a day (QID) | ORAL | Status: DC | PRN
  Administered 2021-02-26 – 2021-02-27 (×2): 2 mg via ORAL
  Filled 2021-02-26 (×2): qty 1

## 2021-02-26 MED ORDER — SODIUM ZIRCONIUM CYCLOSILICATE 10 G PO PACK
10.0000 g | PACK | Freq: Once | ORAL | Status: AC
  Administered 2021-02-26: 10 g via ORAL
  Filled 2021-02-26: qty 1

## 2021-02-26 MED ORDER — SODIUM CHLORIDE 0.9 % IV SOLN: INTRAVENOUS | Status: AC

## 2021-02-26 MED ORDER — IPRATROPIUM-ALBUTEROL 0.5-2.5 (3) MG/3ML IN SOLN
3.0000 mL | Freq: Two times a day (BID) | RESPIRATORY_TRACT | Status: DC
  Administered 2021-02-26 – 2021-03-03 (×11): 3 mL via RESPIRATORY_TRACT
  Filled 2021-02-26: qty 30
  Filled 2021-02-26 (×4): qty 3
  Filled 2021-02-26: qty 9
  Filled 2021-02-26 (×6): qty 3

## 2021-02-26 NOTE — Plan of Care (Signed)
  Problem: Education: Goal: Knowledge of General Education information will improve Description Including pain rating scale, medication(s)/side effects and non-pharmacologic comfort measures Outcome: Progressing   Problem: Health Behavior/Discharge Planning: Goal: Ability to manage health-related needs will improve Outcome: Progressing   

## 2021-02-26 NOTE — Progress Notes (Signed)
Patient Demographics:    Lindsay Cortez, is a 80 y.o. female, DOB - May 15, 1941, MCN:[id number]  Admit date - 02/24/2021   Admitting Physician Bernadette Hoit, DO  Outpatient Primary MD for the patient is Redmond School, MD  LOS - 2  Chief Complaint  Patient presents with   Shortness of Breath        Subjective:    Lindsay Cortez today has no emesis,  No chest pain,   -No further fevers, - hypoxia, cough and dyspnea is not worse -Diarrhea persist patient states this is not new for her  Assessment  & Plan :    Principal Problem:   Sepsis due to community-acquired pneumonia Active Problems:   Acute respiratory failure with hypoxia ([place])   CAP (community acquired pneumonia)   COPD (chronic obstructive pulmonary disease) ([place])   Chronic kidney disease   Dehydration   Leukocytosis   Hypoalbuminemia   Hypothyroidism   Hyperglycemia due to diabetes mellitus ([place])   Essential hypertension  Brief Summary:-  80 y.o. female with medical history significant for hypertension, COPD, T2DM, hypothyroidism, hyperlipidemia, CKD stage IV admitted on 02/24/2021 with sepsis going to: COVID-pneumonia resulting in acute hypoxic respiratory failure  A/p 1) sepsis secondary to community-acquired pneumonia- POA -Patient met sepsis criteria on admission with fevers above 102, tachycardia with heart rate of 115, leukocytosis , tachypnea with respiratory rate over 29, hypoxia with O2 sats of 87%, -PCT 12.39 -WBC down to 11.2 from 21.4 -Chest x-ray consistent with right-sided pneumonia -Continue IV Rocephin and azithromycin, mucolytics and bronchodilators as ordered -Lactic acid 1.9, repeat 1.2`  2)CAP--treat as above #1  3)Acute hypoxic respiratory failure--secondary to #1 and #2 above -Unable to wean off oxygen at this time  4)DM2-continue to hold glimepiride Use Novolog/Humalog Sliding scale insulin with  Accu-Cheks/Fingersticks as ordered   5)AKI----acute kidney injury on CKD stage -  IV -Creatinine is down to 1.76  from 1.82 on admission  renally adjust medications, avoid nephrotoxic agents / dehydration  / hypotension  6) hypothyroidism--continue levothyroxine  7) acute COPD exacerbation--- due to #1 #2 above, -Manage as above #1 #2 -Solu-Medrol as ordered  8)HTN-okay to resume Toprol-XL and amlodipine  9) hyperkalemia with a potassium of 5.2 noted----give Lokelma  Disposition/Need for in-Hospital Stay- patient unable to be discharged at this time due to --sepsis secondary to pneumonia requiring IV antibiotics and IV fluids as well as supplemental oxygen due to acute hypoxic respiratory failure  Status is: Inpatient  Remains inpatient appropriate because: Please see disposition above  Disposition: The patient is from: Home              Anticipated d/c is to: Home              Anticipated d/c date is: 2 days              Patient currently is not medically stable to d/c. Barriers: Not Clinically Stable-   Code Status : -  Code Status: Full Code   Family Communication:    NA (patient is alert, awake and coherent)   Consults  :  na  DVT Prophylaxis  :   - SCDs   enoxaparin (LOVENOX) injection 30 mg Start: 02/25/21 0800 SCDs Start: 02/24/21 2053  Lab Results  Component Value Date   PLT 234 02/26/2021    Inpatient Medications  Scheduled Meds:  amLODipine  5 mg Oral Daily   cholecalciferol  2,000 Units Oral QPM   dextromethorphan-guaiFENesin  1 tablet Oral BID   enoxaparin (LOVENOX) injection  30 mg Subcutaneous Q24H   insulin aspart  0-5 Units Subcutaneous QHS   insulin aspart  0-9 Units Subcutaneous TID WC   ipratropium-albuterol  3 mL Nebulization BID   levothyroxine  75 mcg Oral Q0600   methylPREDNISolone (SOLU-MEDROL) injection  40 mg Intravenous Q12H   metoprolol succinate  50 mg Oral Daily   mometasone-formoterol  2 puff Inhalation BID   Continuous  Infusions:  azithromycin 500 mg (02/26/21 1752)   cefTRIAXone (ROCEPHIN)  IV 2 g (02/26/21 1715)   PRN Meds:.acetaminophen, albuterol, albuterol, guaiFENesin-dextromethorphan, loperamide    Anti-infectives (From admission, onward)    Start     Dose/Rate Route Frequency Ordered Stop   02/24/21 1830  cefTRIAXone (ROCEPHIN) 2 g in sodium chloride 0.9 % 100 mL IVPB        2 g 200 mL/hr over 30 Minutes Intravenous Every 24 hours 02/24/21 1818     02/24/21 1830  azithromycin (ZITHROMAX) 500 mg in sodium chloride 0.9 % 250 mL IVPB        500 mg 250 mL/hr over 60 Minutes Intravenous Every 24 hours 02/24/21 1818           Objective:   Vitals:   02/26/21 0426 02/26/21 0747 02/26/21 0755 02/26/21 1322  BP: 130/71   136/85  Pulse: 97   99  Resp: 16   17  Temp: 98.4 F (36.9 C)   97.7 F (36.5 C)  TempSrc: Oral   Oral  SpO2: 91% 91% 91% 94%  Weight:      Height:        Wt Readings from Last 3 Encounters:  02/25/21 63.8 kg  02/11/21 61.3 kg  01/28/21 63.6 kg     Intake/Output Summary (Last 24 hours) at 02/26/2021 1848 Last data filed at 02/26/2021 0900 Gross per 24 hour  Intake 970.25 ml  Output --  Net 970.25 ml     Physical Exam  Gen:- Awake Alert, ill-appearing, conversational dyspnea HEENT:- [place].AT, No sclera icterus Nose- [place] 2L/min Neck-Supple Neck,No JVD,.  Lungs- -diminished breath sounds with scattered rhonchi bilaterally CV- S1, S2 normal, regular  Abd-  +ve B.Sounds, Abd Soft, No tenderness,    Extremity/Skin:- No  edema, pedal pulses present  Psych-affect is appropriate, oriented x3 Neuro-generalized weakness, no new focal deficits, no tremors   Data Review:   Micro Results Recent Results (from the past 240 hour(s))  Resp Panel by RT-PCR (Flu A&B, Covid) Nasopharyngeal Swab     Status: None   Collection Time: 02/24/21  6:18 PM   Specimen: Nasopharyngeal Swab; Nasopharyngeal(NP) swabs in vial transport medium  Result Value Ref Range Status   SARS  Coronavirus 2 by RT PCR NEGATIVE NEGATIVE Final    Comment: (NOTE) SARS-CoV-2 target nucleic acids are NOT DETECTED.  The SARS-CoV-2 RNA is generally detectable in upper respiratory specimens during the acute phase of infection. The lowest concentration of SARS-CoV-2 viral copies this assay can detect is 138 copies/mL. A negative result does not preclude SARS-Cov-2 infection and should not be used as the sole basis for treatment or other patient management decisions. A negative result may occur with  improper specimen collection/handling, submission of specimen other than nasopharyngeal swab, presence of viral mutation(s) within the  areas targeted by this assay, and inadequate number of viral copies(<138 copies/mL). A negative result must be combined with clinical observations, patient history, and epidemiological information. The expected result is Negative.  Fact Sheet for Patients:  EntrepreneurPulse.com.au  Fact Sheet for Healthcare Providers:  IncredibleEmployment.be  This test is no t yet approved or cleared by the [place] FDA and  has been authorized for detection and/or diagnosis of SARS-CoV-2 by FDA under an Emergency Use Authorization (EUA). This EUA will remain  in effect (meaning this test can be used) for the duration of the COVID-19 declaration under Section 564(b)(1) of the Act, 21 U.S.C.section 360bbb-3(b)(1), unless the authorization is terminated  or revoked sooner.       Influenza A by PCR NEGATIVE NEGATIVE Final   Influenza B by PCR NEGATIVE NEGATIVE Final    Comment: (NOTE) The Xpert Xpress SARS-CoV-2/FLU/RSV plus assay is intended as an aid in the diagnosis of influenza from Nasopharyngeal swab specimens and should not be used as a sole basis for treatment. Nasal washings and aspirates are unacceptable for Xpert Xpress SARS-CoV-2/FLU/RSV testing.  Fact Sheet for  Patients: EntrepreneurPulse.com.au  Fact Sheet for Healthcare Providers: IncredibleEmployment.be  This test is not yet approved or cleared by the [place] FDA and has been authorized for detection and/or diagnosis of SARS-CoV-2 by FDA under an Emergency Use Authorization (EUA). This EUA will remain in effect (meaning this test can be used) for the duration of the COVID-19 declaration under Section 564(b)(1) of the Act, 21 U.S.C. section 360bbb-3(b)(1), unless the authorization is terminated or revoked.  Performed at [facility], [address], [place], [place] [address]   Blood Culture (routine x 2)     Status: None (Preliminary result)   Collection Time: 02/24/21  6:18 PM   Specimen: Right Antecubital; Blood  Result Value Ref Range Status   Specimen Description   Final    RIGHT ANTECUBITAL BOTTLES DRAWN AEROBIC AND ANAEROBIC   Special Requests Blood Culture adequate volume  Final   Culture   Final    NO GROWTH 2 DAYS Performed at [facility], [address] [place], [place] [address]    Report Status PENDING  Incomplete  Blood Culture (routine x 2)     Status: None (Preliminary result)   Collection Time: 02/24/21  6:23 PM   Specimen: BLOOD RIGHT WRIST  Result Value Ref Range Status   Specimen Description   Final    BLOOD RIGHT WRIST BOTTLES DRAWN AEROBIC AND ANAEROBIC   Special Requests Blood Culture adequate volume  Final   Culture   Final    NO GROWTH 2 DAYS Performed at [facility], [address], [place], [place] [address]    Report Status PENDING  Incomplete    Radiology Reports DG Chest Port 1 View  Result Date: 02/24/2021 CLINICAL DATA:  Questionable sepsis, shortness of breath EXAM: PORTABLE CHEST 1 VIEW COMPARISON:  09/01/2014 FINDINGS: Patchy airspace disease throughout the right lung concerning for pneumonia. Left basilar scarring. Heart is borderline in size. No effusions or acute bony abnormality. IMPRESSION: Patchy  right lung airspace disease concerning for pneumonia. Electronically Signed   By: Rolm Baptise M.D.   On: 02/24/2021 19:26     CBC Recent Labs  Lab 02/24/21 1818 02/25/21 0349 02/26/21 0705  WBC 21.4* 20.5* 11.2*  HGB 11.3* 10.0* 10.3*  HCT 34.1* 31.0* 32.3*  PLT 231 188 234  MCV 94.7 97.8 97.3  MCH 31.4 31.5 31.0  MCHC 33.1 32.3 31.9  RDW  14.5 14.5 14.8  LYMPHSABS 0.5*  --   --   MONOABS 1.9*  --   --   EOSABS 0.0  --   --   BASOSABS 0.1  --   --     Chemistries  Recent Labs  Lab 02/24/21 1818 02/25/21 0349 02/26/21 0705  NA 133* 136 136  K 3.5 3.4* 5.2*  CL 101 107 107  CO2 22 21* 18*  GLUCOSE 238* 152* 337*  BUN $Re'21 17 20  'cjU$ CREATININE 1.82* 1.69* 1.76*  CALCIUM 7.8* 6.8* 7.6*  MG  --  1.7  --   AST 18 14* 25  ALT $Re'16 12 17  'TBL$ ALKPHOS 133* 107 149*  BILITOT 1.1 0.6 0.4   ------------------------------------------------------------------------------------------------------------------ No results for input(s): CHOL, HDL, LDLCALC, TRIG, CHOLHDL, LDLDIRECT in the last 72 hours.  Lab Results  Component Value Date   HGBA1C 9.8 (H) 02/24/2021   ------------------------------------------------------------------------------------------------------------------ No results for input(s): TSH, T4TOTAL, T3FREE, THYROIDAB in the last 72 hours.  Invalid input(s): FREET3 ------------------------------------------------------------------------------------------------------------------ No results for input(s): VITAMINB12, FOLATE, FERRITIN, TIBC, IRON, RETICCTPCT in the last 72 hours.  Coagulation profile Recent Labs  Lab 02/24/21 1818 02/25/21 0349  INR 1.1 1.2    No results for input(s): DDIMER in the last 72 hours.  Cardiac Enzymes No results for input(s): CKMB, TROPONINI, MYOGLOBIN in the last 168 hours.  Invalid input(s): CK ------------------------------------------------------------------------------------------------------------------ No results found for:  BNP   Roxan Hockey M.D on 02/26/2021 at 6:48 PM  Go to [email or web address] - for contact info  [facility] - Office  [phone]

## 2021-02-27 LAB — RENAL FUNCTION PANEL
Albumin: 2.6 g/dL — ABNORMAL LOW (ref 3.5–5.0)
Anion gap: 12 (ref 5–15)
BUN: 23 mg/dL (ref 8–23)
CO2: 16 mmol/L — ABNORMAL LOW (ref 22–32)
Calcium: 7.7 mg/dL — ABNORMAL LOW (ref 8.9–10.3)
Chloride: 106 mmol/L (ref 98–111)
Creatinine, Ser: 1.6 mg/dL — ABNORMAL HIGH (ref 0.44–1.00)
GFR, Estimated: 32 mL/min — ABNORMAL LOW (ref >60)
Glucose, Bld: 289 mg/dL — ABNORMAL HIGH (ref 70–99)
Phosphorus: 2.5 mg/dL (ref 2.5–4.6)
Potassium: 3.8 mmol/L (ref 3.5–5.1)
Sodium: 134 mmol/L — ABNORMAL LOW (ref 135–145)

## 2021-02-27 LAB — URINE CULTURE: Culture: NO GROWTH

## 2021-02-27 LAB — GLUCOSE, CAPILLARY
Glucose-Capillary: 265 mg/dL — ABNORMAL HIGH (ref 70–99)
Glucose-Capillary: 433 mg/dL — ABNORMAL HIGH (ref 70–99)
Glucose-Capillary: 84 mg/dL (ref 70–99)
Glucose-Capillary: 145 mg/dL — ABNORMAL HIGH (ref 70–99)

## 2021-02-27 MED ORDER — SODIUM BICARBONATE 650 MG PO TABS
650.0000 mg | ORAL_TABLET | Freq: Two times a day (BID) | ORAL | Status: DC
  Administered 2021-02-27 – 2021-03-03 (×9): 650 mg via ORAL
  Filled 2021-02-27 (×9): qty 1

## 2021-02-27 MED ORDER — K PHOS MONO-SOD PHOS DI & MONO 155-852-130 MG PO TABS
250.0000 mg | ORAL_TABLET | Freq: Two times a day (BID) | ORAL | Status: AC
  Administered 2021-02-27 (×2): 250 mg via ORAL
  Filled 2021-02-27 (×2): qty 1

## 2021-02-27 MED ORDER — INSULIN ASPART 100 UNIT/ML IJ SOLN
18.0000 [IU] | Freq: Once | INTRAMUSCULAR | Status: AC
  Administered 2021-02-27: 18 [IU] via SUBCUTANEOUS

## 2021-02-27 MED ORDER — INSULIN ASPART 100 UNIT/ML IJ SOLN
3.0000 [IU] | Freq: Three times a day (TID) | INTRAMUSCULAR | Status: DC
  Administered 2021-02-28 – 2021-03-03 (×9): 3 [IU] via SUBCUTANEOUS

## 2021-02-27 MED ORDER — INSULIN ASPART 100 UNIT/ML IJ SOLN
0.0000 [IU] | Freq: Three times a day (TID) | INTRAMUSCULAR | Status: DC
  Administered 2021-02-28: 11 [IU] via SUBCUTANEOUS
  Administered 2021-02-28: 7 [IU] via SUBCUTANEOUS
  Administered 2021-02-28: 3 [IU] via SUBCUTANEOUS
  Administered 2021-03-01 (×2): 7 [IU] via SUBCUTANEOUS
  Administered 2021-03-02: 4 [IU] via SUBCUTANEOUS

## 2021-02-27 MED ORDER — HEPARIN SODIUM (PORCINE) 5000 UNIT/ML IJ SOLN
5000.0000 [IU] | Freq: Three times a day (TID) | INTRAMUSCULAR | Status: DC
  Administered 2021-02-28 (×2): 5000 [IU] via SUBCUTANEOUS
  Filled 2021-02-27 (×2): qty 1

## 2021-02-27 MED ORDER — INSULIN GLARGINE 100 UNIT/ML ~~LOC~~ SOLN
15.0000 [IU] | Freq: Every day | SUBCUTANEOUS | Status: DC
  Administered 2021-02-27 – 2021-03-01 (×3): 15 [IU] via SUBCUTANEOUS
  Filled 2021-02-27 (×4): qty 0.15

## 2021-02-27 MED ORDER — HEPARIN SODIUM (PORCINE) 5000 UNIT/ML IJ SOLN: 5000.0000 [IU] | Freq: Three times a day (TID) | INTRAMUSCULAR | Status: DC

## 2021-02-27 NOTE — Progress Notes (Addendum)
Notified Dr. Joesph Fillers about CBG of 433. He put in orders and I will monitor. Use patient's results to inform selection of most appropriate hyperglycemic condition. Lab Results  Component Value Date/Time   CO2 16 (L) 02/27/2021 0427   Anion gap 12 02/27/2021 0427   Glucose, Bld 289 (H) 02/27/2021 0427   Glucose-Capillary 433 (H) 02/27/2021 1104        DKA  HHS   pH, Arterial   <7.3  >7.3   CO2 (from BMET)  <18 mmol/L  >18 mmol/L   Anion Gap  >12  n/a   Plasma Glucose  >250 mg/dL  >600 mg/dL   Osmolality  n/a  >320 mOsm/kg   Beta Hydroxybutyric Acid (BHA)  >3 mmol/L*  <3 mmol/L*   Mental Status  Varies c/ severity  Stupor/coma  * BHA value > 56mmol/L is generally diagnostic for DKA.  However, DKA could be present with BHA as low as 1.5 mmol/L if other factors are present.

## 2021-02-27 NOTE — Plan of Care (Signed)
  Problem: Education: Goal: Knowledge of General Education information will improve Description: Including pain rating scale, medication(s)/side effects and non-pharmacologic comfort measures Outcome: Progressing   Problem: Clinical Measurements: Goal: Ability to maintain clinical measurements within normal limits will improve Outcome: Progressing Goal: Diagnostic test results will improve Outcome: Progressing   

## 2021-02-27 NOTE — Progress Notes (Signed)
Neb given by nurse

## 2021-02-27 NOTE — Progress Notes (Signed)
Gave patient incentive spirometer and instructed on use

## 2021-02-27 NOTE — Progress Notes (Signed)
Patient Demographics:    Lindsay Cortez, is a 80 y.o. female, DOB - Jan 01, 1941, ZOX:[id number]  Admit date - 02/24/2021   Admitting Physician Bernadette Hoit, DO  Outpatient Primary MD for the patient is Redmond School, MD  LOS - 3  Chief Complaint  Patient presents with   Shortness of Breath        Subjective:    Lindsay Cortez today has no emesis,  No chest pain,   -No further fevers, -Younger sister at bedside, questions answered -cough, dypnea and hypoxia persist -Became very dyspneic and hypoxic after ambulating to the bathroom -No leg pains or pleuritic symptoms  Assessment  & Plan :    Principal Problem:   Sepsis due to community-acquired pneumonia Active Problems:   Acute respiratory failure with hypoxia ([name])   CAP (community acquired pneumonia)   COPD (chronic obstructive pulmonary disease) ([name])   Chronic kidney disease   Dehydration   Leukocytosis   Hypoalbuminemia   Hypothyroidism   Hyperglycemia due to diabetes mellitus ([place])   Essential hypertension  Brief Summary:-  80 y.o. female with medical history significant for hypertension, COPD, T2DM, hypothyroidism, hyperlipidemia, CKD stage IV admitted on 02/24/2021 with sepsis going to: COVID-pneumonia resulting in acute hypoxic respiratory failure  A/p 1) sepsis secondary to community-acquired pneumonia- POA -Patient met sepsis criteria on admission with fevers above 102, tachycardia with heart rate of 115, leukocytosis , tachypnea with respiratory rate over 29, hypoxia with O2 sats of 87%, -PCT 12.39 -WBC down to 11.2 from 21.4 -Chest x-ray consistent with right-sided pneumonia -Continue IV Rocephin and azithromycin, mucolytics and bronchodilators as ordered -Lactic acid 1.9, repeat 1.2` -Patient remains very symptomatic from respiratory standpoint, cough, dyspnea and hypoxia persist -Repeat chest x-ray on  02/28/2021  2)CAP--treat as above #1  3)Acute hypoxic respiratory failure--secondary to #1 and #2 above -Unable to wean off oxygen at this time  4)DM2- A1C is 9.8----reflecting uncontrolled diabetes with hyperglycemia PTA --continue to hold glimepiride Use Novolog/Humalog Sliding scale insulin with Accu-Cheks/Fingersticks as ordered   5)AKI----acute kidney injury on CKD stage -  IV -Creatinine is down to 1.60  from 1.82 on admission -Hyperkalemia has resolved after Lokelma  renally adjust medications, avoid nephrotoxic agents / dehydration  / hypotension  6) hypothyroidism--continue levothyroxine  7) acute COPD exacerbation--- due to #1 #2 above, -Manage as above #1 #2 -Solu-Medrol as ordered  8)HTN- c/n  Toprol-XL and Amlodipine   Disposition/Need for in-Hospital Stay- patient unable to be discharged at this time due to --sepsis secondary to pneumonia requiring IV antibiotics and IV fluids as well as supplemental oxygen due to acute hypoxic respiratory failure  Status is: Inpatient  Remains inpatient appropriate because: Please see disposition above  Disposition: The patient is from: Home              Anticipated d/c is to: Home              Anticipated d/c date is: 2 days              Patient currently is not medically stable to d/c. Barriers: Not Clinically Stable-   Code Status : -  Code Status: Full Code   Family Communication:    NA (patient is alert, awake and coherent)  Discussed with younger sister at bedside Consults  :  na  DVT Prophylaxis  :   - SCDs   heparin injection 5,000 Units Start: 02/27/21 1400 Place TED hose Start: 02/27/21 1006 SCDs Start: 02/24/21 2053    Lab Results  Component Value Date   PLT 234 02/26/2021    Inpatient Medications  Scheduled Meds:  amLODipine  5 mg Oral Daily   cholecalciferol  2,000 Units Oral QPM   dextromethorphan-guaiFENesin  1 tablet Oral BID   enoxaparin (LOVENOX) injection  30 mg Subcutaneous Q24H   insulin  aspart  0-5 Units Subcutaneous QHS   insulin aspart  0-9 Units Subcutaneous TID WC   ipratropium-albuterol  3 mL Nebulization BID   levothyroxine  75 mcg Oral Q0600   methylPREDNISolone (SOLU-MEDROL) injection  40 mg Intravenous Q12H   metoprolol succinate  50 mg Oral Daily   mometasone-formoterol  2 puff Inhalation BID   phosphorus  250 mg Oral BID   sodium bicarbonate  650 mg Oral BID   Continuous Infusions:  azithromycin 500 mg (02/26/21 1752)   cefTRIAXone (ROCEPHIN)  IV 2 g (02/26/21 1715)   PRN Meds:.acetaminophen, albuterol, albuterol, guaiFENesin-dextromethorphan, loperamide    Anti-infectives (From admission, onward)    Start     Dose/Rate Route Frequency Ordered Stop   02/24/21 1830  cefTRIAXone (ROCEPHIN) 2 g in sodium chloride 0.9 % 100 mL IVPB        2 g 200 mL/hr over 30 Minutes Intravenous Every 24 hours 02/24/21 1818     02/24/21 1830  azithromycin (ZITHROMAX) 500 mg in sodium chloride 0.9 % 250 mL IVPB        500 mg 250 mL/hr over 60 Minutes Intravenous Every 24 hours 02/24/21 1818           Objective:   Vitals:   02/26/21 2029 02/27/21 0557 02/27/21 0738 02/27/21 0744  BP:  103/87    Pulse:  (!) 104    Resp:  18    Temp:  97.7 F (36.5 C)    TempSrc:  Oral    SpO2: 91% 93% 98% 98%  Weight:      Height:        Wt Readings from Last 3 Encounters:  02/25/21 63.8 kg  02/11/21 61.3 kg  01/28/21 63.6 kg     Intake/Output Summary (Last 24 hours) at 02/27/2021 1003 Last data filed at 02/27/2021 0300 Gross per 24 hour  Intake 612.56 ml  Output --  Net 612.56 ml     Physical Exam  Gen:- Awake Alert,  dyspnea on exertion persist HEENT:- [name], No sclera icterus Nose- [name] 2L/min Neck-Supple Neck,No JVD,.  Lungs- -diminished breath sounds with scattered rhonchi bilaterally CV- S1, S2 normal, regular  Abd-  +ve B.Sounds, Abd Soft, No tenderness,    Extremity/Skin:- No  edema, pedal pulses present  Psych-affect is appropriate, oriented  x3 Neuro-generalized weakness, no new focal deficits, no tremors   Data Review:   Micro Results Recent Results (from the past 240 hour(s))  Resp Panel by RT-PCR (Flu A&B, Covid) Nasopharyngeal Swab     Status: None   Collection Time: 02/24/21  6:18 PM   Specimen: Nasopharyngeal Swab; Nasopharyngeal(NP) swabs in vial transport medium  Result Value Ref Range Status   SARS Coronavirus 2 by RT PCR NEGATIVE NEGATIVE Final    Comment: (NOTE) SARS-CoV-2 target nucleic acids are NOT DETECTED.  The SARS-CoV-2 RNA is generally detectable in upper respiratory specimens during the acute phase of infection. The lowest  concentration of SARS-CoV-2 viral copies this assay can detect is 138 copies/mL. A negative result does not preclude SARS-Cov-2 infection and should not be used as the sole basis for treatment or other patient management decisions. A negative result may occur with  improper specimen collection/handling, submission of specimen other than nasopharyngeal swab, presence of viral mutation(s) within the areas targeted by this assay, and inadequate number of viral copies(<138 copies/mL). A negative result must be combined with clinical observations, patient history, and epidemiological information. The expected result is Negative.  Fact Sheet for Patients:  EntrepreneurPulse.com.au  Fact Sheet for Healthcare Providers:  IncredibleEmployment.be  This test is no t yet approved or cleared by the [place] FDA and  has been authorized for detection and/or diagnosis of SARS-CoV-2 by FDA under an Emergency Use Authorization (EUA). This EUA will remain  in effect (meaning this test can be used) for the duration of the COVID-19 declaration under Section 564(b)(1) of the Act, 21 U.S.C.section 360bbb-3(b)(1), unless the authorization is terminated  or revoked sooner.       Influenza A by PCR NEGATIVE NEGATIVE Final   Influenza B by PCR NEGATIVE  NEGATIVE Final    Comment: (NOTE) The Xpert Xpress SARS-CoV-2/FLU/RSV plus assay is intended as an aid in the diagnosis of influenza from Nasopharyngeal swab specimens and should not be used as a sole basis for treatment. Nasal washings and aspirates are unacceptable for Xpert Xpress SARS-CoV-2/FLU/RSV testing.  Fact Sheet for Patients: EntrepreneurPulse.com.au  Fact Sheet for Healthcare Providers: IncredibleEmployment.be  This test is not yet approved or cleared by the [place] FDA and has been authorized for detection and/or diagnosis of SARS-CoV-2 by FDA under an Emergency Use Authorization (EUA). This EUA will remain in effect (meaning this test can be used) for the duration of the COVID-19 declaration under Section 564(b)(1) of the Act, 21 U.S.C. section 360bbb-3(b)(1), unless the authorization is terminated or revoked.  Performed at [facility], [address] [place], [place] [address]   Blood Culture (routine x 2)     Status: None (Preliminary result)   Collection Time: 02/24/21  6:18 PM   Specimen: Right Antecubital; Blood  Result Value Ref Range Status   Specimen Description   Final    RIGHT ANTECUBITAL BOTTLES DRAWN AEROBIC AND ANAEROBIC   Special Requests Blood Culture adequate volume  Final   Culture   Final    NO GROWTH 2 DAYS Performed at [facility], [address] [place], [place] [address]    Report Status PENDING  Incomplete  Blood Culture (routine x 2)     Status: None (Preliminary result)   Collection Time: 02/24/21  6:23 PM   Specimen: BLOOD RIGHT WRIST  Result Value Ref Range Status   Specimen Description   Final    BLOOD RIGHT WRIST BOTTLES DRAWN AEROBIC AND ANAEROBIC   Special Requests Blood Culture adequate volume  Final   Culture   Final    NO GROWTH 2 DAYS Performed at [facility], [address], [place], [place] [address]    Report Status PENDING  Incomplete    Radiology Reports DG Chest Port 1  View  Result Date: 02/24/2021 CLINICAL DATA:  Questionable sepsis, shortness of breath EXAM: PORTABLE CHEST 1 VIEW COMPARISON:  09/01/2014 FINDINGS: Patchy airspace disease throughout the right lung concerning for pneumonia. Left basilar scarring. Heart is borderline in size. No effusions or acute bony abnormality. IMPRESSION: Patchy right lung airspace disease concerning for pneumonia. Electronically Signed   By: Lennette Bihari  [place] M.D.   On: 02/24/2021 19:26     CBC Recent Labs  Lab 02/24/21 1818 02/25/21 0349 02/26/21 0705  WBC 21.4* 20.5* 11.2*  HGB 11.3* 10.0* 10.3*  HCT 34.1* 31.0* 32.3*  PLT 231 188 234  MCV 94.7 97.8 97.3  MCH 31.4 31.5 31.0  MCHC 33.1 32.3 31.9  RDW 14.5 14.5 14.8  LYMPHSABS 0.5*  --   --   MONOABS 1.9*  --   --   EOSABS 0.0  --   --   BASOSABS 0.1  --   --     Chemistries  Recent Labs  Lab 02/24/21 1818 02/25/21 0349 02/26/21 0705 02/27/21 0427  NA 133* 136 136 134*  K 3.5 3.4* 5.2* 3.8  CL 101 107 107 106  CO2 22 21* 18* 16*  GLUCOSE 238* 152* 337* 289*  BUN $Re'21 17 20 23  'ine$ CREATININE 1.82* 1.69* 1.76* 1.60*  CALCIUM 7.8* 6.8* 7.6* 7.7*  MG  --  1.7  --   --   AST 18 14* 25  --   ALT $Re'16 12 17  'mou$ --   ALKPHOS 133* 107 149*  --   BILITOT 1.1 0.6 0.4  --    ------------------------------------------------------------------------------------------------------------------ No results for input(s): CHOL, HDL, LDLCALC, TRIG, CHOLHDL, LDLDIRECT in the last 72 hours.  Lab Results  Component Value Date   HGBA1C 9.8 (H) 02/24/2021   ------------------------------------------------------------------------------------------------------------------ No results for input(s): TSH, T4TOTAL, T3FREE, THYROIDAB in the last 72 hours.  Invalid input(s): FREET3 ------------------------------------------------------------------------------------------------------------------ No results for input(s): VITAMINB12, FOLATE, FERRITIN, TIBC, IRON, RETICCTPCT in the last 72  hours.  Coagulation profile Recent Labs  Lab 02/24/21 1818 02/25/21 0349  INR 1.1 1.2    No results for input(s): DDIMER in the last 72 hours.  Cardiac Enzymes No results for input(s): CKMB, TROPONINI, MYOGLOBIN in the last 168 hours.  Invalid input(s): CK ------------------------------------------------------------------------------------------------------------------ No results found for: BNP   Roxan Hockey M.D on 02/27/2021 at 10:03 AM  Go to [email or web address] - for contact info  [facility] - Office  [phone]

## 2021-02-28 ENCOUNTER — Encounter (HOSPITAL_COMMUNITY): Payer: Self-pay | Admitting: Internal Medicine

## 2021-02-28 ENCOUNTER — Inpatient Hospital Stay (HOSPITAL_COMMUNITY): Payer: PPO

## 2021-02-28 LAB — GLUCOSE, CAPILLARY
Glucose-Capillary: 219 mg/dL — ABNORMAL HIGH (ref 70–99)
Glucose-Capillary: 271 mg/dL — ABNORMAL HIGH (ref 70–99)
Glucose-Capillary: 145 mg/dL — ABNORMAL HIGH (ref 70–99)

## 2021-02-28 LAB — BASIC METABOLIC PANEL
Anion gap: 8 (ref 5–15)
BUN: 25 mg/dL — ABNORMAL HIGH (ref 8–23)
CO2: 19 mmol/L — ABNORMAL LOW (ref 22–32)
Calcium: 7.2 mg/dL — ABNORMAL LOW (ref 8.9–10.3)
Chloride: 107 mmol/L (ref 98–111)
Creatinine, Ser: 1.46 mg/dL — ABNORMAL HIGH (ref 0.44–1.00)
GFR, Estimated: 36 mL/min — ABNORMAL LOW (ref >60)
Glucose, Bld: 233 mg/dL — ABNORMAL HIGH (ref 70–99)
Potassium: 3.9 mmol/L (ref 3.5–5.1)
Sodium: 134 mmol/L — ABNORMAL LOW (ref 135–145)

## 2021-02-28 LAB — CBC
HCT: 30.3 % — ABNORMAL LOW (ref 36.0–46.0)
Hemoglobin: 9.8 g/dL — ABNORMAL LOW (ref 12.0–15.0)
MCH: 30.8 pg (ref 26.0–34.0)
MCHC: 32.3 g/dL (ref 30.0–36.0)
MCV: 95.3 fL (ref 80.0–100.0)
Platelets: 287 10*3/uL (ref 150–400)
RBC: 3.18 MIL/uL — ABNORMAL LOW (ref 3.87–5.11)
RDW: 14.8 % (ref 11.5–15.5)
WBC: 11.1 10*3/uL — ABNORMAL HIGH (ref 4.0–10.5)
nRBC: 0 % (ref 0.0–0.2)

## 2021-02-28 MED ORDER — TECHNETIUM TO 99M ALBUMIN AGGREGATED
4.0000 | Freq: Once | INTRAVENOUS | Status: AC | PRN
  Administered 2021-02-28: 4.2 via INTRAVENOUS

## 2021-02-28 MED ORDER — ENOXAPARIN SODIUM 60 MG/0.6ML IJ SOSY
60.0000 mg | PREFILLED_SYRINGE | INTRAMUSCULAR | Status: DC
  Administered 2021-02-28: 60 mg via SUBCUTANEOUS
  Filled 2021-02-28: qty 0.6

## 2021-02-28 NOTE — Progress Notes (Addendum)
Patient Demographics:    Lindsay Cortez, is a 80 y.o. female, DOB - 1940-11-20, CVE:[id number]  Admit date - 02/24/2021   Admitting Physician Bernadette Hoit, DO  Outpatient Primary MD for the patient is Redmond School, MD  LOS - 4  Chief Complaint  Patient presents with   Shortness of Breath        Subjective:    [name] today has no emesis,  No chest pain,   -No further fevers, -Shortness of breath and persistent hypoxia noted - She desaturated down to 81 to 82% even on 4 L of oxygen when she ambulated -  Assessment  & Plan :    Principal Problem:   Sepsis due to community-acquired pneumonia Active Problems:   Acute respiratory failure with hypoxia (HCC)   CAP (community acquired pneumonia)   COPD (chronic obstructive pulmonary disease) ([place])   Chronic kidney disease   Dehydration   Leukocytosis   Hypoalbuminemia   Hypothyroidism   Hyperglycemia due to diabetes mellitus ([place])   Essential hypertension  Brief Summary:-  80 y.o. female with medical history significant for hypertension, COPD, T2DM, hypothyroidism, hyperlipidemia, CKD stage IV admitted on 02/24/2021 with sepsis going to: COVID-pneumonia resulting in acute hypoxic respiratory failure  A/p 1) sepsis secondary to community-acquired pneumonia- POA -Patient met sepsis criteria on admission with fevers above 102, tachycardia with heart rate of 115, leukocytosis , tachypnea with respiratory rate over 29, hypoxia with O2 sats of 87%, -PCT 12.39 -WBC down to 11.2 from 21.4 -Chest x-ray consistent with right-sided pneumonia -Continue IV Rocephin and azithromycin, mucolytics and bronchodilators as ordered -Lactic acid 1.9, repeat 1.2` -Patient remains very symptomatic from respiratory standpoint, cough, dyspnea and hypoxia persist - She desaturated down to 81 to 82% even on 4 L of oxygen when she ambulated  -Repeat chest  x-ray on 02/28/2021 showed improving pneumonia  2)CAP--treat as above #1 -Profound hypoxia persist  3)Acute hypoxic respiratory failure--secondary to #1 and #2 above -Unable to wean off oxygen at this time --She desaturated down to 81 to 82% even on 4 L of oxygen when she ambulated -Unable to do CTA chest due to elevated creatinine -VQ scan without evidence of PE -Lower extremity Dopplers inconclusive, therapeutic Lovenox given  4)DM2- A1C is 9.8----reflecting uncontrolled diabetes with hyperglycemia PTA --continue to hold glimepiride Use Novolog/Humalog Sliding scale insulin with Accu-Cheks/Fingersticks as ordered   5)AKI----acute kidney injury on CKD stage -  IV -Creatinine is down to 1.46  from 1.82 on admission -Hyperkalemia has resolved after Lokelma  renally adjust medications, avoid nephrotoxic agents / dehydration  / hypotension  6) hypothyroidism--continue levothyroxine  7) acute COPD exacerbation--- due to #1 #2 above, -Manage as above #1 #2 -Solu-Medrol as ordered  8)HTN- c/n  Toprol-XL and Amlodipine  9)Acute Anemia--no bleeding concerns, suspect this is due to hemodilution   Disposition/Need for in-Hospital Stay- patient unable to be discharged at this time due to --sepsis secondary to pneumonia requiring IV antibiotics and IV fluids as well as supplemental oxygen due to acute hypoxic respiratory failure--profound hypoxia persist  Status is: Inpatient  Remains inpatient appropriate because: Please see disposition above  Disposition: The patient is from: Home              Anticipated d/c  is to: Home              Anticipated d/c date is: 2 days              Patient currently is not medically stable to d/c. Barriers: Not Clinically Stable-   Code Status : -  Code Status: Full Code   Family Communication:    NA (patient is alert, awake and coherent)  Discussed with sister  Consults  :  na  DVT Prophylaxis  :   - SCDs   Place TED hose Start: 02/27/21 1006 SCDs  Start: 02/24/21 2053   Lab Results  Component Value Date   PLT 287 02/28/2021    Inpatient Medications  Scheduled Meds:  amLODipine  5 mg Oral Daily   cholecalciferol  2,000 Units Oral QPM   dextromethorphan-guaiFENesin  1 tablet Oral BID   enoxaparin (LOVENOX) injection  60 mg Subcutaneous Q24H   insulin aspart  0-20 Units Subcutaneous TID WC   insulin aspart  0-5 Units Subcutaneous QHS   insulin aspart  3 Units Subcutaneous TID WC   insulin glargine  15 Units Subcutaneous Daily   ipratropium-albuterol  3 mL Nebulization BID   levothyroxine  75 mcg Oral Q0600   methylPREDNISolone (SOLU-MEDROL) injection  40 mg Intravenous Q12H   metoprolol succinate  50 mg Oral Daily   mometasone-formoterol  2 puff Inhalation BID   sodium bicarbonate  650 mg Oral BID   Continuous Infusions:  azithromycin 500 mg (02/27/21 1824)   cefTRIAXone (ROCEPHIN)  IV 2 g (02/27/21 1748)   PRN Meds:.acetaminophen, albuterol, albuterol, guaiFENesin-dextromethorphan, loperamide  Anti-infectives (From admission, onward)    Start     Dose/Rate Route Frequency Ordered Stop   02/24/21 1830  cefTRIAXone (ROCEPHIN) 2 g in sodium chloride 0.9 % 100 mL IVPB        2 g 200 mL/hr over 30 Minutes Intravenous Every 24 hours 02/24/21 1818     02/24/21 1830  azithromycin (ZITHROMAX) 500 mg in sodium chloride 0.9 % 250 mL IVPB        500 mg 250 mL/hr over 60 Minutes Intravenous Every 24 hours 02/24/21 1818           Objective:   Vitals:   02/27/21 2112 02/28/21 0426 02/28/21 0719 02/28/21 0727  BP: (!) 144/97 (!) 141/70    Pulse: (!) 106 (!) 105    Resp: 19 20    Temp: 98 F (36.7 C) 98 F (36.7 C)    TempSrc: Oral Oral    SpO2: 98% 92% 91% 91%  Weight:      Height:        Wt Readings from Last 3 Encounters:  02/25/21 63.8 kg  02/11/21 61.3 kg  01/28/21 63.6 kg     Intake/Output Summary (Last 24 hours) at 02/28/2021 1738 Last data filed at 02/28/2021 1316 Gross per 24 hour  Intake 840 ml   Output --  Net 840 ml     Physical Exam  Gen:- Awake Alert,  dyspnea on exertion persist HEENT:- [place].AT, No sclera icterus Nose- [place] 4L/min Neck-Supple Neck,No JVD,.  Lungs- -diminished breath sounds with scattered rhonchi bilaterally CV- S1, S2 normal, regular  Abd-  +ve B.Sounds, Abd Soft, No tenderness,    Extremity/Skin:- No  edema, pedal pulses present  Psych-affect is appropriate, oriented x3 Neuro-generalized weakness, no new focal deficits, no tremors   Data Review:   Micro Results Recent Results (from the past 240 hour(s))  Resp Panel by RT-PCR (  Flu A&B, Covid) Nasopharyngeal Swab     Status: None   Collection Time: 02/24/21  6:18 PM   Specimen: Nasopharyngeal Swab; Nasopharyngeal(NP) swabs in vial transport medium  Result Value Ref Range Status   SARS Coronavirus 2 by RT PCR NEGATIVE NEGATIVE Final    Comment: (NOTE) SARS-CoV-2 target nucleic acids are NOT DETECTED.  The SARS-CoV-2 RNA is generally detectable in upper respiratory specimens during the acute phase of infection. The lowest concentration of SARS-CoV-2 viral copies this assay can detect is 138 copies/mL. A negative result does not preclude SARS-Cov-2 infection and should not be used as the sole basis for treatment or other patient management decisions. A negative result may occur with  improper specimen collection/handling, submission of specimen other than nasopharyngeal swab, presence of viral mutation(s) within the areas targeted by this assay, and inadequate number of viral copies(<138 copies/mL). A negative result must be combined with clinical observations, patient history, and epidemiological information. The expected result is Negative.  Fact Sheet for Patients:  EntrepreneurPulse.com.au  Fact Sheet for Healthcare Providers:  IncredibleEmployment.be  This test is no t yet approved or cleared by the [place] FDA and  has been authorized for  detection and/or diagnosis of SARS-CoV-2 by FDA under an Emergency Use Authorization (EUA). This EUA will remain  in effect (meaning this test can be used) for the duration of the COVID-19 declaration under Section 564(b)(1) of the Act, 21 U.S.C.section 360bbb-3(b)(1), unless the authorization is terminated  or revoked sooner.       Influenza A by PCR NEGATIVE NEGATIVE Final   Influenza B by PCR NEGATIVE NEGATIVE Final    Comment: (NOTE) The Xpert Xpress SARS-CoV-2/FLU/RSV plus assay is intended as an aid in the diagnosis of influenza from Nasopharyngeal swab specimens and should not be used as a sole basis for treatment. Nasal washings and aspirates are unacceptable for Xpert Xpress SARS-CoV-2/FLU/RSV testing.  Fact Sheet for Patients: EntrepreneurPulse.com.au  Fact Sheet for Healthcare Providers: IncredibleEmployment.be  This test is not yet approved or cleared by the [place] FDA and has been authorized for detection and/or diagnosis of SARS-CoV-2 by FDA under an Emergency Use Authorization (EUA). This EUA will remain in effect (meaning this test can be used) for the duration of the COVID-19 declaration under Section 564(b)(1) of the Act, 21 U.S.C. section 360bbb-3(b)(1), unless the authorization is terminated or revoked.  Performed at [facility], [address], [place], [place] [address]   Blood Culture (routine x 2)     Status: None (Preliminary result)   Collection Time: 02/24/21  6:18 PM   Specimen: Right Antecubital; Blood  Result Value Ref Range Status   Specimen Description   Final    RIGHT ANTECUBITAL BOTTLES DRAWN AEROBIC AND ANAEROBIC   Special Requests Blood Culture adequate volume  Final   Culture   Final    NO GROWTH 3 DAYS Performed at [facility], [address] [place], [place] [address]    Report Status PENDING  Incomplete  Blood Culture (routine x 2)     Status: None (Preliminary result)   Collection Time:  02/24/21  6:23 PM   Specimen: BLOOD RIGHT WRIST  Result Value Ref Range Status   Specimen Description   Final    BLOOD RIGHT WRIST BOTTLES DRAWN AEROBIC AND ANAEROBIC   Special Requests Blood Culture adequate volume  Final   Culture   Final    NO GROWTH 3 DAYS Performed at [facility], [address], [place], [place] [address]  Report Status PENDING  Incomplete  Urine culture     Status: None   Collection Time: 02/25/21  6:18 AM   Specimen: In/Out Cath Urine  Result Value Ref Range Status   Specimen Description   Final    IN/OUT CATH URINE Performed at [facility], [address] [place], [place] [address]    Special Requests   Final    NONE Performed at [facility], [address] [place], [place] [address]    Culture   Final    NO GROWTH Performed at [facility], [place] [address], [place], [place] [address]    Report Status 02/27/2021 FINAL  Final    Radiology Reports DG Chest 2 View  Result Date: 02/28/2021 CLINICAL DATA:  Dyspnea and respiratory abnormalities. EXAM: CHEST - 2 VIEW COMPARISON:  02/24/2021 and 09/01/2014 FINDINGS: Decreased patchy densities in the right perihilar region and right upper lung. There continues to be few patchy densities in the central aspect of the right chest. Blunting at the costophrenic angles and findings are suggestive for small effusions. Lateral view is limited because the arms are not elevated. Densities in the midthoracic spine on the lateral view are probably related degenerative changes based on previous examination. Atherosclerotic calcifications at the aortic arch. IMPRESSION: Persistent but decreasing patchy densities in the right lung. Findings are suggestive for resolving pneumonia. Small pleural effusions. Electronically Signed   By: Markus Daft M.D.   On: 02/28/2021 08:53   NM Pulmonary Perfusion  Result Date: 02/28/2021 CLINICAL DATA:  Suspected pulmonary embolus. Three day history of shortness of breath. EXAM: NUCLEAR  MEDICINE PERFUSION LUNG SCAN TECHNIQUE: Perfusion images were obtained in multiple projections after intravenous injection of radiopharmaceutical. Ventilation scans intentionally deferred if perfusion scan and chest x-ray adequate for interpretation during COVID 19 epidemic. RADIOPHARMACEUTICALS:  4.2 mCi Tc-15m MAA IV COMPARISON:  Chest x-ray today FINDINGS: There is normal perfusion. No pleural based wedge shaped defects are identified. IMPRESSION: Normal perfusion. Electronically Signed   By: Nolon Nations M.D.   On: 02/28/2021 13:44   DG Chest Port 1 View  Result Date: 02/24/2021 CLINICAL DATA:  Questionable sepsis, shortness of breath EXAM: PORTABLE CHEST 1 VIEW COMPARISON:  09/01/2014 FINDINGS: Patchy airspace disease throughout the right lung concerning for pneumonia. Left basilar scarring. Heart is borderline in size. No effusions or acute bony abnormality. IMPRESSION: Patchy right lung airspace disease concerning for pneumonia. Electronically Signed   By: Rolm Baptise M.D.   On: 02/24/2021 19:26     CBC Recent Labs  Lab 02/24/21 1818 02/25/21 0349 02/26/21 0705 02/28/21 0650  WBC 21.4* 20.5* 11.2* 11.1*  HGB 11.3* 10.0* 10.3* 9.8*  HCT 34.1* 31.0* 32.3* 30.3*  PLT 231 188 234 287  MCV 94.7 97.8 97.3 95.3  MCH 31.4 31.5 31.0 30.8  MCHC 33.1 32.3 31.9 32.3  RDW 14.5 14.5 14.8 14.8  LYMPHSABS 0.5*  --   --   --   MONOABS 1.9*  --   --   --   EOSABS 0.0  --   --   --   BASOSABS 0.1  --   --   --     Chemistries  Recent Labs  Lab 02/24/21 1818 02/25/21 0349 02/26/21 0705 02/27/21 0427 02/28/21 0650  NA 133* 136 136 134* 134*  K 3.5 3.4* 5.2* 3.8 3.9  CL 101 107 107 106 107  CO2 22 21* 18* 16* 19*  GLUCOSE 238* 152* 337* 289* 233*  BUN 21 17 20  23 25*  CREATININE 1.82* 1.69* 1.76* 1.60* 1.46*  CALCIUM 7.8* 6.8* 7.6* 7.7* 7.2*  MG  --  1.7  --   --   --   AST 18 14* 25  --   --   ALT $Re'16 12 17  'hIP$ --   --   ALKPHOS 133* 107 149*  --   --   BILITOT 1.1 0.6 0.4  --   --     ------------------------------------------------------------------------------------------------------------------ No results for input(s): CHOL, HDL, LDLCALC, TRIG, CHOLHDL, LDLDIRECT in the last 72 hours.  Lab Results  Component Value Date   HGBA1C 9.8 (H) 02/24/2021   ------------------------------------------------------------------------------------------------------------------ No results for input(s): TSH, T4TOTAL, T3FREE, THYROIDAB in the last 72 hours.  Invalid input(s): FREET3 ------------------------------------------------------------------------------------------------------------------ No results for input(s): VITAMINB12, FOLATE, FERRITIN, TIBC, IRON, RETICCTPCT in the last 72 hours.  Coagulation profile Recent Labs  Lab 02/24/21 1818 02/25/21 0349  INR 1.1 1.2    No results for input(s): DDIMER in the last 72 hours.  Cardiac Enzymes No results for input(s): CKMB, TROPONINI, MYOGLOBIN in the last 168 hours.  Invalid input(s): CK ------------------------------------------------------------------------------------------------------------------ No results found for: BNP   Roxan Hockey M.D on 02/28/2021 at 5:38 PM  Go to [email or web address] - for contact info  [facility] - Office  [phone]

## 2021-02-28 NOTE — Evaluation (Signed)
Physical Therapy Evaluation Patient Details Name: Lindsay Cortez MRN: [id number] DOB: 02-05-1941 Today's Date: 02/28/2021   History of Present Illness  Lindsay Cortez is a 80 y.o. female with medical history significant for hypertension, COPD, T2DM, hypothyroidism, hyperlipidemia, CKD stage IV who presents to the emergency department accompanied by a friend due to 4-day onset of increasing shortness of breath.  Shortness of breath worsens with exertion and was associated with dry cough and pleuritic chest and upper back pain (worsens with cough), she also complained of fatigue and subjective fever.  She denies any sick contact and endorsed getting 2 COVID vaccines and a booster.  She complained of decreased appetite since onset of illness, but she denies nausea, vomiting, sore throat, headache, abdominal pain, diarrhea or constipation.   Clinical Impression  Patient limited for functional mobility as stated below secondary to BLE weakness, fatigue and impaired activity tolerance. Patient does not require assist for bed mobility. Patient demonstrates good sitting tolerance and sitting balance EOB with O2 at 92% on 4 L. Patient transfers to standing with RW and does not require physical assist. Patient ambulates with RW and increasing SOB with increased distance. Patient O2 decreases to 81% on 4 L with ambulation with SOB. Patient returned to room and is instructed on pursed lip breathing. Patient will benefit from continued physical therapy in hospital and recommended venue below to increase strength, balance, endurance for safe ADLs and gait.     Follow Up Recommendations Home health PT    Equipment Recommendations  3in1 (PT)    Recommendations for Other Services       Precautions / Restrictions Precautions Precautions: Fall Restrictions Weight Bearing Restrictions: No      Mobility  Bed Mobility Overal bed mobility: Modified Independent                  Transfers Overall  transfer level: Needs assistance Equipment used: Rolling walker (2 wheeled) Transfers: Sit to/from Stand Sit to Stand: Min guard         General transfer comment: transfer to standing with RW  Ambulation/Gait Ambulation/Gait assistance: Min guard Gait Distance (Feet): 25 Feet Assistive device: Rolling walker (2 wheeled) Gait Pattern/deviations: Step-through pattern;Trunk flexed Gait velocity: decreased   General Gait Details: slow, labored, increasing SOB throughout on 4L; O2 sat decrease to 81% on 4 L with ambulation  Stairs            Wheelchair Mobility    Modified Rankin (Stroke Patients Only)       Balance Overall balance assessment: Needs assistance   Sitting balance-Leahy Scale: Normal Sitting balance - Comments: seated EOB   Standing balance support: Bilateral upper extremity supported Standing balance-Leahy Scale: Fair Standing balance comment: with RW                             Pertinent Vitals/Pain Pain Assessment: No/denies pain    Home Living Family/patient expects to be discharged to:: Private residence Living Arrangements: Alone Available Help at Discharge: Friend(s);Family;Available PRN/intermittently Type of Home: Apartment Home Access: Level entry     Home Layout: One level Home Equipment: Walker - 4 wheels;Walker - 2 wheels;Cane - single point;Wheelchair - manual      Prior Function Level of Independence: Independent with assistive device(s)         Comments: Patient states short distance community ambulator with rollator, independent with ADL     Hand Dominance  Extremity/Trunk Assessment   Upper Extremity Assessment Upper Extremity Assessment: Overall WFL for tasks assessed    Lower Extremity Assessment Lower Extremity Assessment: Generalized weakness    Cervical / Trunk Assessment Cervical / Trunk Assessment: Normal  Communication   Communication: No difficulties  Cognition  Arousal/Alertness: Awake/alert Behavior During Therapy: WFL for tasks assessed/performed Overall Cognitive Status: Within Functional Limits for tasks assessed                                        General Comments      Exercises     Assessment/Plan    PT Assessment Patient needs continued PT services  PT Problem List Decreased mobility;Decreased strength;Decreased activity tolerance;Decreased balance;Cardiopulmonary status limiting activity       PT Treatment Interventions DME instruction;Therapeutic exercise;Gait training;Balance training;Stair training;Neuromuscular re-education;Functional mobility training;Therapeutic activities;Patient/family education    PT Goals (Current goals can be found in the Care Plan section)  Acute Rehab PT Goals Patient Stated Goal: Return home PT Goal Formulation: With patient Time For Goal Achievement: 03/14/21 Potential to Achieve Goals: Good    Frequency Min 3X/week   Barriers to discharge        Co-evaluation               AM-PAC PT "6 Clicks" Mobility  Outcome Measure Help needed turning from your back to your side while in a flat bed without using bedrails?: None Help needed moving from lying on your back to sitting on the side of a flat bed without using bedrails?: None Help needed moving to and from a bed to a chair (including a wheelchair)?: A Little Help needed standing up from a chair using your arms (e.g., wheelchair or bedside chair)?: A Little Help needed to walk in hospital room?: A Little Help needed climbing 3-5 steps with a railing? : A Lot 6 Click Score: 19    End of Session Equipment Utilized During Treatment: Oxygen Activity Tolerance: Patient tolerated treatment well;Patient limited by fatigue Patient left: in bed;with family/visitor present;with call bell/phone within reach Nurse Communication: Mobility status PT Visit Diagnosis: Unsteadiness on feet (R26.81);Other abnormalities of gait  and mobility (R26.89);Muscle weakness (generalized) (M62.81)    Time: 1694-5038 PT Time Calculation (min) (ACUTE ONLY): 16 min   Charges:   PT Evaluation $PT Eval Low Complexity: 1 Low PT Treatments $Therapeutic Activity: 8-22 mins        11:19 AM, 02/28/21 Mearl Latin PT, DPT Physical Therapist at [facility]

## 2021-02-28 NOTE — Plan of Care (Signed)
  Problem: Education: Goal: Knowledge of General Education information will improve Description: Including pain rating scale, medication(s)/side effects and non-pharmacologic comfort measures Outcome: Progressing   Problem: Clinical Measurements: Goal: Diagnostic test results will improve Outcome: Progressing   

## 2021-02-28 NOTE — Progress Notes (Signed)
SATURATION QUALIFICATIONS: (This note is used to comply with regulatory documentation for home oxygen)  Patient Saturations on Room Air at Rest = n/a%  Patient Saturations on Room Air while Ambulating = n/a%  Patient Saturations on 4l Liters of oxygen while Ambulating = 84%  Please briefly explain why patient needs home oxygen: Not able to ambulate patient on RA because she was 92 % on 4L at rest

## 2021-02-28 NOTE — Plan of Care (Signed)
  Problem: Acute Rehab PT Goals(only PT should resolve) Goal: Patient Will Transfer Sit To/From Stand Outcome: Progressing Flowsheets (Taken 02/28/2021 1120) Patient will transfer sit to/from stand: with modified independence Goal: Pt Will Transfer Bed To Chair/Chair To Bed Outcome: Progressing Flowsheets (Taken 02/28/2021 1120) Pt will Transfer Bed to Chair/Chair to Bed: with modified independence Goal: Pt/caregiver will Perform Home Exercise Program Outcome: Progressing Flowsheets (Taken 02/28/2021 1120) Pt/caregiver will Perform Home Exercise Program:  For increased strengthening  For improved balance  Independently  11:22 AM, 02/28/21 Mearl Latin PT, DPT Physical Therapist at [facility]

## 2021-03-01 ENCOUNTER — Inpatient Hospital Stay (HOSPITAL_COMMUNITY): Payer: PPO

## 2021-03-01 DIAGNOSIS — R0609 Other forms of dyspnea: Secondary | ICD-10-CM

## 2021-03-01 LAB — CBC
HCT: 28.3 % — ABNORMAL LOW (ref 36.0–46.0)
Hemoglobin: 9.4 g/dL — ABNORMAL LOW (ref 12.0–15.0)
MCH: 31.4 pg (ref 26.0–34.0)
MCHC: 33.2 g/dL (ref 30.0–36.0)
MCV: 94.6 fL (ref 80.0–100.0)
Platelets: 322 10*3/uL (ref 150–400)
RBC: 2.99 MIL/uL — ABNORMAL LOW (ref 3.87–5.11)
RDW: 15.2 % (ref 11.5–15.5)
WBC: 11.2 10*3/uL — ABNORMAL HIGH (ref 4.0–10.5)
nRBC: 0 % (ref 0.0–0.2)

## 2021-03-01 LAB — ECHOCARDIOGRAM COMPLETE
AR max vel: 2.01 cm2
AV Area VTI: 2.14 cm2
AV Area mean vel: 2.2 cm2
AV Mean grad: 8 mmHg
AV Peak grad: 17.8 mmHg
Ao pk vel: 2.11 m/s
Area-P 1/2: 4.24 cm2
Height: 55 in
MV VTI: 2.74 cm2
S' Lateral: 3.23 cm
Weight: 2249.6 oz

## 2021-03-01 LAB — GLUCOSE, CAPILLARY
Glucose-Capillary: 243 mg/dL — ABNORMAL HIGH (ref 70–99)
Glucose-Capillary: 224 mg/dL — ABNORMAL HIGH (ref 70–99)
Glucose-Capillary: 219 mg/dL — ABNORMAL HIGH (ref 70–99)
Glucose-Capillary: 41 mg/dL — CL (ref 70–99)
Glucose-Capillary: 88 mg/dL (ref 70–99)
Glucose-Capillary: 253 mg/dL — ABNORMAL HIGH (ref 70–99)

## 2021-03-01 LAB — RENAL FUNCTION PANEL
Albumin: 2.3 g/dL — ABNORMAL LOW (ref 3.5–5.0)
Anion gap: 10 (ref 5–15)
BUN: 27 mg/dL — ABNORMAL HIGH (ref 8–23)
CO2: 20 mmol/L — ABNORMAL LOW (ref 22–32)
Calcium: 7.6 mg/dL — ABNORMAL LOW (ref 8.9–10.3)
Chloride: 106 mmol/L (ref 98–111)
Creatinine, Ser: 1.51 mg/dL — ABNORMAL HIGH (ref 0.44–1.00)
GFR, Estimated: 35 mL/min — ABNORMAL LOW (ref >60)
Glucose, Bld: 353 mg/dL — ABNORMAL HIGH (ref 70–99)
Phosphorus: 3.7 mg/dL (ref 2.5–4.6)
Potassium: 3.3 mmol/L — ABNORMAL LOW (ref 3.5–5.1)
Sodium: 136 mmol/L (ref 135–145)

## 2021-03-01 MED ORDER — HEPARIN SODIUM (PORCINE) 5000 UNIT/ML IJ SOLN
5000.0000 [IU] | Freq: Three times a day (TID) | INTRAMUSCULAR | Status: DC
  Administered 2021-03-01 – 2021-03-03 (×5): 5000 [IU] via SUBCUTANEOUS
  Filled 2021-03-01 (×5): qty 1

## 2021-03-01 MED ORDER — INSULIN GLARGINE 100 UNIT/ML ~~LOC~~ SOLN
18.0000 [IU] | Freq: Every day | SUBCUTANEOUS | Status: DC
  Filled 2021-03-01: qty 0.18

## 2021-03-01 MED ORDER — INSULIN GLARGINE 100 UNIT/ML ~~LOC~~ SOLN
15.0000 [IU] | Freq: Every day | SUBCUTANEOUS | Status: DC
  Administered 2021-03-02 – 2021-03-03 (×2): 15 [IU] via SUBCUTANEOUS
  Filled 2021-03-01 (×3): qty 0.15

## 2021-03-01 NOTE — Progress Notes (Signed)
Patient Demographics:    Lindsay Cortez, is a 80 y.o. female, DOB - 1941/03/21, WVP:[id number]  Admit date - 02/24/2021   Admitting Physician Bernadette Hoit, DO  Outpatient Primary MD for the patient is Redmond School, MD  LOS - 5  Chief Complaint  Patient presents with   Shortness of Breath        Subjective:    Lindsay Cortez today has no emesis,  No chest pain,   -No further fevers, -Shortness of breath and persistent hypoxia noted - -Patient continues to desaturate with ambulation even on 4 L -Overnight patient had episode of tachycardia up to the 170s -  Assessment  & Plan :    Principal Problem:   Sepsis due to community-acquired pneumonia Active Problems:   Acute respiratory failure with hypoxia ([place])   CAP (community acquired pneumonia)   COPD (chronic obstructive pulmonary disease) ([place])   Chronic kidney disease   Dehydration   Leukocytosis   Hypoalbuminemia   Hypothyroidism   Hyperglycemia due to diabetes mellitus ([place])   Essential hypertension  Brief Summary:-  80 y.o. female with medical history significant for hypertension, COPD, T2DM, hypothyroidism, hyperlipidemia, CKD stage IV admitted on 02/24/2021 with sepsis and CAP pneumonia resulting in acute hypoxic respiratory failure  A/p 1) sepsis secondary to community-acquired pneumonia- POA -Patient met sepsis criteria on admission with fevers above 102, tachycardia with heart rate of 115, leukocytosis , tachypnea with respiratory rate over 29, hypoxia with O2 sats of 87%, -PCT 12.39 -WBC down to 11.2 from 21.4 (leukocytosis may persist due to steroids) -Chest x-ray consistent with right-sided pneumonia -Continue IV Rocephin and azithromycin, mucolytics and bronchodilators as ordered -Lactic acid 1.9, repeat 1.2` -Patient remains very symptomatic from respiratory standpoint, cough, dyspnea and hypoxia persist - She  desaturated with ambulation even on 4 L of oxygen   -Repeat chest x-ray on 02/28/2021 showed improving pneumonia -We will repeat chest x-ray on 03/02/2021 -Given persistent hypoxia and tachycardia with significant dyspnea PE rule was done with negative VQ scan and negative lower extremity Dopplers, unable to do CTA chest due to renal concerns  2)CAP--treat as above #1 -Profound hypoxia persist  3)Acute hypoxic respiratory failure--secondary to #1 and #2 above -Unable to wean off oxygen at this time- please see 1 above ---Unable to do CTA chest due to elevated creatinine -VQ scan without evidence of PE -Lower extremity Dopplers without evidence of DVT   4)DM2- A1C is 9.8----reflecting uncontrolled diabetes with hyperglycemia PTA --continue to hold glimepiride -Episode of hypoglycemia on 03/01/2021- insulin regimen adjusted Use Novolog/Humalog Sliding scale insulin with Accu-Cheks/Fingersticks as ordered   5)AKI----acute kidney injury on CKD stage -  IV -Creatinine is down to 1.51  from 1.82 on admission -Hyperkalemia has resolved after [place]  renally adjust medications, avoid nephrotoxic agents / dehydration  / hypotension  6) hypothyroidism--continue levothyroxine  7) acute COPD exacerbation--- due to #1 #2 above, -Manage as above #1 #2 -Solu-Medrol as ordered  8)HTN- c/n  Toprol-XL and Amlodipine  9)Acute Anemia--no bleeding concerns, suspect this is due to hemodilution -Hemoglobin down to 9.4  10) tachyarrhythmia with persistent hypoxia--- echo pending -TSH pending   Disposition/Need for in-Hospital Stay- patient unable to be discharged at this time due to --sepsis secondary to pneumonia requiring  IV antibiotics and IV fluids as well as supplemental oxygen due to acute hypoxic respiratory failure--profound hypoxia persist --Discharge home when able to ambulate without desaturating on 4 Lof oxygen via [place], patient declines SNF  Status is: Inpatient  Remains inpatient  appropriate because: Please see disposition above  Disposition: The patient is from: Home              Anticipated d/c is to: Home              Anticipated d/c date is: 2 days              Patient currently is not medically stable to d/c. Barriers: Not Clinically Stable-  --Discharge home when able to ambulate without desaturating on 4 Lof oxygen via [place], patient declines SNF  Code Status : -  Code Status: Full Code   Family Communication:    NA (patient is alert, awake and coherent)  Discussed with sister  Consults  :  na  DVT Prophylaxis  :   - SCDs   heparin injection 5,000 Units Start: 03/01/21 2200 Place TED hose Start: 02/27/21 1006 SCDs Start: 02/24/21 2053   Lab Results  Component Value Date   PLT 322 03/01/2021    Inpatient Medications  Scheduled Meds:  amLODipine  5 mg Oral Daily   cholecalciferol  2,000 Units Oral QPM   dextromethorphan-guaiFENesin  1 tablet Oral BID   heparin injection (subcutaneous)  5,000 Units Subcutaneous Q8H   insulin aspart  0-20 Units Subcutaneous TID WC   insulin aspart  0-5 Units Subcutaneous QHS   insulin aspart  3 Units Subcutaneous TID WC   [START ON 03/02/2021] insulin glargine  15 Units Subcutaneous Daily   ipratropium-albuterol  3 mL Nebulization BID   levothyroxine  75 mcg Oral Q0600   methylPREDNISolone (SOLU-MEDROL) injection  40 mg Intravenous Q12H   metoprolol succinate  50 mg Oral Daily   mometasone-formoterol  2 puff Inhalation BID   sodium bicarbonate  650 mg Oral BID   Continuous Infusions:  azithromycin 500 mg (02/28/21 1839)   cefTRIAXone (ROCEPHIN)  IV 2 g (03/01/21 1802)   PRN Meds:.acetaminophen, albuterol, albuterol, guaiFENesin-dextromethorphan, loperamide  Anti-infectives (From admission, onward)    Start     Dose/Rate Route Frequency Ordered Stop   02/24/21 1830  cefTRIAXone (ROCEPHIN) 2 g in sodium chloride 0.9 % 100 mL IVPB        2 g 200 mL/hr over 30 Minutes Intravenous Every 24 hours 02/24/21 1818      02/24/21 1830  azithromycin (ZITHROMAX) 500 mg in sodium chloride 0.9 % 250 mL IVPB        500 mg 250 mL/hr over 60 Minutes Intravenous Every 24 hours 02/24/21 1818           Objective:   Vitals:   03/01/21 0548 03/01/21 0732 03/01/21 0739 03/01/21 1359  BP: (!) 143/85   (!) 144/73  Pulse: (!) 107   92  Resp:    18  Temp: 98.4 F (36.9 C)   98 F (36.7 C)  TempSrc: Oral   Oral  SpO2: 94% 94% 94% 95%  Weight:      Height:        Wt Readings from Last 3 Encounters:  02/25/21 63.8 kg  02/11/21 61.3 kg  01/28/21 63.6 kg     Intake/Output Summary (Last 24 hours) at 03/01/2021 1834 Last data filed at 03/01/2021 0844 Gross per 24 hour  Intake 240 ml  Output --  Net 240 ml     Physical Exam  Gen:- Awake Alert,  dyspnea on exertion persist HEENT:- [place].AT, No sclera icterus Nose- [place] 4L/min Neck-Supple Neck,No JVD,.  Lungs- -diminished breath sounds with scattered rhonchi bilaterally CV- S1, S2 normal, regular  Abd-  +ve B.Sounds, Abd Soft, No tenderness,    Extremity/Skin:- No  edema, pedal pulses present  Psych-affect is appropriate, oriented x3 Neuro-generalized weakness, no new focal deficits, no tremors   Data Review:   Micro Results Recent Results (from the past 240 hour(s))  Resp Panel by RT-PCR (Flu A&B, Covid) Nasopharyngeal Swab     Status: None   Collection Time: 02/24/21  6:18 PM   Specimen: Nasopharyngeal Swab; Nasopharyngeal(NP) swabs in vial transport medium  Result Value Ref Range Status   SARS Coronavirus 2 by RT PCR NEGATIVE NEGATIVE Final    Comment: (NOTE) SARS-CoV-2 target nucleic acids are NOT DETECTED.  The SARS-CoV-2 RNA is generally detectable in upper respiratory specimens during the acute phase of infection. The lowest concentration of SARS-CoV-2 viral copies this assay can detect is 138 copies/mL. A negative result does not preclude SARS-Cov-2 infection and should not be used as the sole basis for treatment or other patient  management decisions. A negative result may occur with  improper specimen collection/handling, submission of specimen other than nasopharyngeal swab, presence of viral mutation(s) within the areas targeted by this assay, and inadequate number of viral copies(<138 copies/mL). A negative result must be combined with clinical observations, patient history, and epidemiological information. The expected result is Negative.  Fact Sheet for Patients:  EntrepreneurPulse.com.au  Fact Sheet for Healthcare Providers:  IncredibleEmployment.be  This test is no t yet approved or cleared by the [place] FDA and  has been authorized for detection and/or diagnosis of SARS-CoV-2 by FDA under an Emergency Use Authorization (EUA). This EUA will remain  in effect (meaning this test can be used) for the duration of the COVID-19 declaration under Section 564(b)(1) of the Act, 21 U.S.C.section 360bbb-3(b)(1), unless the authorization is terminated  or revoked sooner.       Influenza A by PCR NEGATIVE NEGATIVE Final   Influenza B by PCR NEGATIVE NEGATIVE Final    Comment: (NOTE) The Xpert Xpress SARS-CoV-2/FLU/RSV plus assay is intended as an aid in the diagnosis of influenza from Nasopharyngeal swab specimens and should not be used as a sole basis for treatment. Nasal washings and aspirates are unacceptable for Xpert Xpress SARS-CoV-2/FLU/RSV testing.  Fact Sheet for Patients: EntrepreneurPulse.com.au  Fact Sheet for Healthcare Providers: IncredibleEmployment.be  This test is not yet approved or cleared by the [place] FDA and has been authorized for detection and/or diagnosis of SARS-CoV-2 by FDA under an Emergency Use Authorization (EUA). This EUA will remain in effect (meaning this test can be used) for the duration of the COVID-19 declaration under Section 564(b)(1) of the Act, 21 U.S.C. section 360bbb-3(b)(1),  unless the authorization is terminated or revoked.  Performed at [facility], [address] [place], [place] [address]   Blood Culture (routine x 2)     Status: None (Preliminary result)   Collection Time: 02/24/21  6:18 PM   Specimen: Right Antecubital; Blood  Result Value Ref Range Status   Specimen Description   Final    RIGHT ANTECUBITAL BOTTLES DRAWN AEROBIC AND ANAEROBIC   Special Requests Blood Culture adequate volume  Final   Culture   Final    NO GROWTH 3 DAYS Performed at [facility], [address], [place], [place]  [address]    Report Status PENDING  Incomplete  Blood Culture (routine x 2)     Status: None (Preliminary result)   Collection Time: 02/24/21  6:23 PM   Specimen: BLOOD RIGHT WRIST  Result Value Ref Range Status   Specimen Description   Final    BLOOD RIGHT WRIST BOTTLES DRAWN AEROBIC AND ANAEROBIC   Special Requests Blood Culture adequate volume  Final   Culture   Final    NO GROWTH 3 DAYS Performed at [facility], [address] [place], [place] [address]    Report Status PENDING  Incomplete  Urine culture     Status: None   Collection Time: 02/25/21  6:18 AM   Specimen: In/Out Cath Urine  Result Value Ref Range Status   Specimen Description   Final    IN/OUT CATH URINE Performed at [facility], [address], [place], [place] [address]    Special Requests   Final    NONE Performed at [facility], [address] [place], [place] [address]    Culture   Final    NO GROWTH Performed at [facility], [place] [address], [place],  [address]    Report Status 02/27/2021 FINAL  Final    Radiology Reports DG Chest 2 View  Result Date: 02/28/2021 CLINICAL DATA:  Dyspnea and respiratory abnormalities. EXAM: CHEST - 2 VIEW COMPARISON:  02/24/2021 and 09/01/2014 FINDINGS: Decreased patchy densities in the right perihilar region and right upper lung. There continues to be few patchy densities in the central aspect of the right chest. Blunting  at the costophrenic angles and findings are suggestive for small effusions. Lateral view is limited because the arms are not elevated. Densities in the midthoracic spine on the lateral view are probably related degenerative changes based on previous examination. Atherosclerotic calcifications at the aortic arch. IMPRESSION: Persistent but decreasing patchy densities in the right lung. Findings are suggestive for resolving pneumonia. Small pleural effusions. Electronically Signed   By: Markus Daft M.D.   On: 02/28/2021 08:53   NM Pulmonary Perfusion  Result Date: 02/28/2021 CLINICAL DATA:  Suspected pulmonary embolus. Three day history of shortness of breath. EXAM: NUCLEAR MEDICINE PERFUSION LUNG SCAN TECHNIQUE: Perfusion images were obtained in multiple projections after intravenous injection of radiopharmaceutical. Ventilation scans intentionally deferred if perfusion scan and chest x-ray adequate for interpretation during COVID 19 epidemic. RADIOPHARMACEUTICALS:  4.2 mCi Tc-64mMAA IV COMPARISON:  Chest x-ray today FINDINGS: There is normal perfusion. No pleural based wedge shaped defects are identified. IMPRESSION: Normal perfusion. Electronically Signed   By: ENolon NationsM.D.   On: 02/28/2021 13:44   UKoreaVenous Img Lower Bilateral (DVT)  Result Date: 03/01/2021 CLINICAL DATA:  Pulmonary embolism EXAM: BILATERAL LOWER EXTREMITY VENOUS DOPPLER ULTRASOUND TECHNIQUE: Gray-scale sonography with compression, as well as color and duplex ultrasound, were performed to evaluate the deep venous system(s) from the level of the common femoral vein through the popliteal and proximal calf veins. COMPARISON:  None. FINDINGS: VENOUS Normal compressibility of BILATERAL common femoral, superficial femoral, and popliteal veins, as well as the visualized calf veins. Visualized portions of BILATERAL profunda femoral vein and great saphenous vein unremarkable. No filling defects to suggest DVT on grayscale or color Doppler  imaging. Doppler waveforms show normal direction of venous flow, normal respiratory plasticity and response to augmentation. OTHER None. Limitations: none IMPRESSION: No evidence of deep venous thrombosis in either lower extremity. Electronically Signed   By: MLavonia DanaM.D.   On:  03/01/2021 11:45   DG Chest Port 1 View  Result Date: 02/24/2021 CLINICAL DATA:  Questionable sepsis, shortness of breath EXAM: PORTABLE CHEST 1 VIEW COMPARISON:  09/01/2014 FINDINGS: Patchy airspace disease throughout the right lung concerning for pneumonia. Left basilar scarring. Heart is borderline in size. No effusions or acute bony abnormality. IMPRESSION: Patchy right lung airspace disease concerning for pneumonia. Electronically Signed   By: Rolm Baptise M.D.   On: 02/24/2021 19:26   ECHOCARDIOGRAM COMPLETE  Result Date: 03/01/2021    ECHOCARDIOGRAM REPORT   Patient Name:   ALAENA STRADER Date of Exam: 03/01/2021 Medical Rec #:  [id number]       Height:       55.0 in Accession #:    [id number]      Weight:       140.6 lb Date of Birth:  Jun 25, 1941      BSA:          1.509 m Patient Age:    33 years        BP:           143/85 mmHg Patient Gender: F               HR:           107 bpm. Exam Location:  [facility] Procedure: 2D Echo, Cardiac Doppler and Color Doppler Indications:    Dyspnea  History:        Patient has no prior history of Echocardiogram examinations.                 COPD; Risk Factors:Hypertension and Diabetes.  Sonographer:    Wenda Low Referring Phys: Adrian  1. Left ventricular ejection fraction, by estimation, is 65 to 70%. The left ventricle has normal function. The left ventricle has no regional wall motion abnormalities. Left ventricular diastolic parameters are indeterminate.  2. Right ventricular systolic function is normal. The right ventricular size is normal. There is mildly elevated pulmonary artery systolic pressure.  3. Left atrial size was mildly dilated.  4.  The mitral valve is degenerative. Trivial mitral valve regurgitation. No evidence of mitral stenosis. Moderate mitral annular calcification.  5. The aortic valve was not well visualized. There is moderate calcification of the aortic valve. There is moderate thickening of the aortic valve. Aortic valve regurgitation is not visualized. Mild to moderate aortic valve sclerosis/calcification is present, without any evidence of aortic stenosis.  6. The inferior vena cava is dilated in size with >50% respiratory variability, suggesting right atrial pressure of 8 mmHg. FINDINGS  Left Ventricle: Left ventricular ejection fraction, by estimation, is 65 to 70%. The left ventricle has normal function. The left ventricle has no regional wall motion abnormalities. The left ventricular internal cavity size was normal in size. There is  no left ventricular hypertrophy. Left ventricular diastolic parameters are indeterminate. Right Ventricle: The right ventricular size is normal. No increase in right ventricular wall thickness. Right ventricular systolic function is normal. There is mildly elevated pulmonary artery systolic pressure. The tricuspid regurgitant velocity is 3.03  m/s, and with an assumed right atrial pressure of 3 mmHg, the estimated right ventricular systolic pressure is 56.3 mmHg. Left Atrium: Left atrial size was mildly dilated. Right Atrium: Right atrial size was normal in size. Pericardium: There is no evidence of pericardial effusion. Mitral Valve: The mitral valve is degenerative in appearance. There is moderate thickening of the mitral valve leaflet(s). There is moderate calcification of the mitral valve  leaflet(s). Moderate mitral annular calcification. Trivial mitral valve regurgitation. No evidence of mitral valve stenosis. MV peak gradient, 11.0 mmHg. The mean mitral valve gradient is 5.0 mmHg. Tricuspid Valve: The tricuspid valve is normal in structure. Tricuspid valve regurgitation is mild . No evidence  of tricuspid stenosis. Aortic Valve: The aortic valve was not well visualized. There is moderate calcification of the aortic valve. There is moderate thickening of the aortic valve. Aortic valve regurgitation is not visualized. Mild to moderate aortic valve sclerosis/calcification is present, without any evidence of aortic stenosis. Aortic valve mean gradient measures 8.0 mmHg. Aortic valve peak gradient measures 17.8 mmHg. Aortic valve area, by VTI measures 2.14 cm. Pulmonic Valve: The pulmonic valve was normal in structure. Pulmonic valve regurgitation is mild. No evidence of pulmonic stenosis. Aorta: The aortic root is normal in size and structure. Venous: The inferior vena cava is dilated in size with greater than 50% respiratory variability, suggesting right atrial pressure of 8 mmHg. IAS/Shunts: No atrial level shunt detected by color flow Doppler.  LEFT VENTRICLE PLAX 2D LVIDd:         5.22 cm LVIDs:         3.23 cm LV PW:         0.96 cm LV IVS:        0.89 cm LVOT diam:     2.00 cm LV SV:         85 LV SV Index:   56 LVOT Area:     3.14 cm  RIGHT VENTRICLE RV Basal diam:  3.58 cm RV Mid diam:    2.65 cm RV S prime:     14.30 cm/s TAPSE (M-mode): 2.1 cm LEFT ATRIUM             Index       RIGHT ATRIUM           Index LA diam:        4.10 cm 2.72 cm/m  RA Area:     14.60 cm LA Vol (A2C):   51.2 ml 33.94 ml/m RA Volume:   39.20 ml  25.98 ml/m LA Vol (A4C):   48.8 ml 32.34 ml/m LA Biplane Vol: 51.2 ml 33.94 ml/m  AORTIC VALVE AV Area (Vmax):    2.01 cm AV Area (Vmean):   2.20 cm AV Area (VTI):     2.14 cm AV Vmax:           211.00 cm/s AV Vmean:          128.000 cm/s AV VTI:            0.398 m AV Peak Grad:      17.8 mmHg AV Mean Grad:      8.0 mmHg LVOT Vmax:         135.00 cm/s LVOT Vmean:        89.500 cm/s LVOT VTI:          0.271 m LVOT/AV VTI ratio: 0.68  AORTA Ao Root diam: 3.00 cm MITRAL VALVE                TRICUSPID VALVE MV Area (PHT): 4.24 cm     TR Peak grad:   36.7 mmHg MV Area VTI:    2.74 cm     TR Vmax:        303.00 cm/s MV Peak grad:  11.0 mmHg MV Mean grad:  5.0 mmHg     SHUNTS MV Vmax:       1.66  m/s     Systemic VTI:  0.27 m MV Vmean:      107.0 cm/s   Systemic Diam: 2.00 cm MV Decel Time: 179 msec MV E velocity: 87.30 cm/s MV A velocity: 147.00 cm/s MV E/A ratio:  0.59 Jenkins Rouge MD Electronically signed by Jenkins Rouge MD Signature Date/Time: 03/01/2021/11:06:15 AM    Final      CBC Recent Labs  Lab 02/24/21 1818 02/25/21 0349 02/26/21 0705 02/28/21 0650 03/01/21 0903  WBC 21.4* 20.5* 11.2* 11.1* 11.2*  HGB 11.3* 10.0* 10.3* 9.8* 9.4*  HCT 34.1* 31.0* 32.3* 30.3* 28.3*  PLT 231 188 234 287 322  MCV 94.7 97.8 97.3 95.3 94.6  MCH 31.4 31.5 31.0 30.8 31.4  MCHC 33.1 32.3 31.9 32.3 33.2  RDW 14.5 14.5 14.8 14.8 15.2  LYMPHSABS 0.5*  --   --   --   --   MONOABS 1.9*  --   --   --   --   EOSABS 0.0  --   --   --   --   BASOSABS 0.1  --   --   --   --     Chemistries  Recent Labs  Lab 02/24/21 1818 02/25/21 0349 02/26/21 0705 02/27/21 0427 02/28/21 0650 03/01/21 0903  NA 133* 136 136 134* 134* 136  K 3.5 3.4* 5.2* 3.8 3.9 3.3*  CL 101 107 107 106 107 106  CO2 22 21* 18* 16* 19* 20*  GLUCOSE 238* 152* 337* 289* 233* 353*  BUN _0 25* 27*  CREATININE 1.82* 1.69* 1.76* 1.60* 1.46* 1.51*  CALCIUM 7.8* 6.8* 7.6* 7.7* 7.2* 7.6*  MG  --  1.7  --   --   --   --   AST 18 14* 25  --   --   --   ALT _1 --   --   --   ALKPHOS 133* 107 149*  --   --   --   BILITOT 1.1 0.6 0.4  --   --   --    ------------------------------------------------------------------------------------------------------------------ No results for input(s): CHOL, HDL, LDLCALC, TRIG, CHOLHDL, LDLDIRECT in the last 72 hours.  Lab Results  Component Value Date   HGBA1C 9.8 (H) 02/24/2021   ------------------------------------------------------------------------------------------------------------------ No results for input(s): TSH, T4TOTAL, T3FREE, THYROIDAB in the  last 72 hours.  Invalid input(s): FREET3 ------------------------------------------------------------------------------------------------------------------ No results for input(s): VITAMINB12, FOLATE, FERRITIN, TIBC, IRON, RETICCTPCT in the last 72 hours.  Coagulation profile Recent Labs  Lab 02/24/21 1818 02/25/21 0349  INR 1.1 1.2    No results for input(s): DDIMER in the last 72 hours.  Cardiac Enzymes No results for input(s): CKMB, TROPONINI, MYOGLOBIN in the last 168 hours.  Invalid input(s): CK ------------------------------------------------------------------------------------------------------------------ No results found for: BNP   Roxan Hockey M.D on 03/01/2021 at 6:34 PM  Go to [email or web address] - for contact info  [facility] - Office  [phone]

## 2021-03-01 NOTE — Progress Notes (Signed)
Pt appears to be relaxed in bed after assistance with repositioning. Pt stated she no longer felt light headed. HR: 108. MD made aware of current HR. Pt educated of calling for assistance when transferring to [facility].

## 2021-03-01 NOTE — Progress Notes (Signed)
Inpatient Diabetes Program Recommendations  AACE/ADA: New Consensus Statement on Inpatient Glycemic Control (2015)  Target Ranges:  Prepandial:   less than 140 mg/dL      Peak postprandial:   less than 180 mg/dL (1-2 hours)      Critically ill patients:  140 - 180 mg/dL   Lab Results  Component Value Date   GLUCAP 253 (H) 03/01/2021   HGBA1C 9.8 (H) 02/24/2021    Review of Glycemic Control  Diabetes history: DM2 Outpatient Diabetes medications: Amaryl 2 mg in am and 1 mg QPM Current orders for Inpatient glycemic control: Lantus 15 units QD, Novolog 0-20 units TID with meals and 0-5 HS + 3 units TID  On Solumedrol 40 mg Q12H HgbA1C - 9.8% Had hypoglycemia in 40s this afternoon  Inpatient Diabetes Program Recommendations:    Decrease Novolog to 0-15 units TID with meals and 0-5 HS Add CHO mod med to heart healthy diet  Follow glucose trends.  Thank you. Lorenda Peck, RD, LDN, CDE Inpatient Diabetes Coordinator [phone]

## 2021-03-01 NOTE — Progress Notes (Signed)
Hypoglycemic Event  CBG: 40  Treatment: 8 oz juice/soda  Symptoms: None  Follow-up CBG: Time:1725 CBG Result:88  Possible Reasons for Event: Unknown  Comments/MD notified: Cour[place], MD notified face-to-face    Zachery Conch

## 2021-03-01 NOTE — Progress Notes (Signed)
Pt was transferring from [facility] to bed and had a increase in heart rate. Patient stated during this time she felt light headed. Pt HR elevated and sustained in the 170s. Pt was assisted back to bed and instructed to bare down. Pt also done breathing exercises. MD made aware of episode and MEWs being yellow. No new orders at this time.

## 2021-03-01 NOTE — Progress Notes (Signed)
  Echocardiogram 2D Echocardiogram has been performed.  Lindsay Cortez 03/01/2021, 10:48 AM

## 2021-03-01 NOTE — TOC Initial Note (Signed)
Transition of Care [facility]) - Initial/Assessment Note    Patient Details  Name: Lindsay Cortez MRN: [id number] Date of Birth: October 06, 1940  Transition of Care [facility]) CM/SW Contact:    Natasha Bence, LCSW Phone Number: 03/01/2021, 4:11 PM  Clinical Narrative:                 Patient is an 80 year old female admitted for Sepsis due to community-acquired pneumonia. CSW observed patient's high readmission risk score and conducted readmission risk assessment. CSW also conducted initial assessment. Patient's daughter reported that patient is normally able to complete ADL's independently, except for cleaning her home. Family and patient are agreeable fo HH. TOC to follow.    Expected Discharge Plan: [place] Barriers to Discharge: Continued Medical Work up   Patient Goals and CMS Choice Patient states their goals for this hospitalization and ongoing recovery are:: Return home with [facility] CMS Medicare.gov Compare Post Acute Care list provided to:: Patient Choice offered to / list presented to : Patient  Expected Discharge Plan and Services Expected Discharge Plan: [place]                                              Prior Living Arrangements/Services   Lives with:: Self Patient language and need for interpreter reviewed:: Yes Do you feel safe going back to the place where you live?: Yes      Need for Family Participation in Patient Care: Yes (Comment) Care giver support system in place?: Yes (comment)   Criminal Activity/Legal Involvement Pertinent to Current Situation/Hospitalization: No - Comment as needed  Activities of Daily Living Home Assistive Devices/Equipment: Walker (specify type) ADL Screening (condition at time of admission) Patient's cognitive ability adequate to safely complete daily activities?: Yes Is the patient deaf or have difficulty hearing?: No Does the patient have difficulty seeing, even when wearing  glasses/contacts?: No Does the patient have difficulty concentrating, remembering, or making decisions?: No Patient able to express need for assistance with ADLs?: Yes Does the patient have difficulty dressing or bathing?: No Independently performs ADLs?: Yes (appropriate for developmental age) Does the patient have difficulty walking or climbing stairs?: No Weakness of Legs: None Weakness of Arms/Hands: None  Permission Sought/Granted   Permission granted to share information with : Yes, Verbal Permission Granted  Share Information with NAME: Melburn Popper (Sister)   [phone]  Permission granted to share info w AGENCY: [employer] [place] granted to share info w Relationship: (Sister)  Permission granted to share info w Contact Information: [phone]  Emotional Assessment       Orientation: : Oriented to Self, Oriented to Situation, Oriented to Place, Oriented to  Time Alcohol / Substance Use: Not Applicable Psych Involvement: No (comment)  Admission diagnosis:  CAP (community acquired pneumonia) [J18.9] Community acquired pneumonia of right lower lobe of lung [J18.9] Sepsis without acute organ dysfunction, due to unspecified organism [facility]) [A41.9] Patient Active Problem List   Diagnosis Date Noted   CAP (community acquired pneumonia) 02/24/2021   Sepsis due to community-acquired pneumonia 02/24/2021   Hypoalbuminemia 02/24/2021   Hypothyroidism 02/24/2021   Hyperglycemia due to diabetes mellitus ([place]) 02/24/2021   Essential hypertension 02/24/2021   Leukocytosis 07/11/2019   Acute respiratory failure with hypoxia ([place]) 09/02/2014   Chronic kidney disease 09/02/2014   Nausea and vomiting 09/01/2014  COPD (chronic obstructive pulmonary disease) ([place]) 09/01/2014   Dehydration 09/01/2014   Hypoxia 09/01/2014   Diabetes mellitus ([place]) 09/01/2014   Acute exacerbation of COPD with asthma ([place]) 09/01/2014   Pancreatic mass 05/19/2014   Common bile duct  calculi 05/19/2014   Right knee pain 09/18/2013   Lateral meniscus derangement 09/18/2013   Arthritis of right knee 09/18/2013   PCP:  Redmond School, MD Pharmacy:   [place], [place] - [id number] [place] [address] Phone: [phone] Fax: [phone]     Social Determinants of Health (SDOH) Interventions    Readmission Risk Interventions Readmission Risk Prevention Plan 03/01/2021  Transportation Screening Complete  PCP or Specialist Appt within 3-5 Days Complete  HRI or [place] Complete  Social Work Consult for [place] Planning/Counseling Complete  Palliative Care Screening Not Applicable  Medication Review [employer]) Complete  Some recent data might be hidden

## 2021-03-02 ENCOUNTER — Inpatient Hospital Stay (HOSPITAL_COMMUNITY): Payer: PPO

## 2021-03-02 LAB — RENAL FUNCTION PANEL
Albumin: 2.4 g/dL — ABNORMAL LOW (ref 3.5–5.0)
Anion gap: 11 (ref 5–15)
BUN: 28 mg/dL — ABNORMAL HIGH (ref 8–23)
CO2: 21 mmol/L — ABNORMAL LOW (ref 22–32)
Calcium: 8 mg/dL — ABNORMAL LOW (ref 8.9–10.3)
Chloride: 108 mmol/L (ref 98–111)
Creatinine, Ser: 1.34 mg/dL — ABNORMAL HIGH (ref 0.44–1.00)
GFR, Estimated: 40 mL/min — ABNORMAL LOW (ref >60)
Glucose, Bld: 164 mg/dL — ABNORMAL HIGH (ref 70–99)
Phosphorus: 3.1 mg/dL (ref 2.5–4.6)
Potassium: 3.2 mmol/L — ABNORMAL LOW (ref 3.5–5.1)
Sodium: 140 mmol/L (ref 135–145)

## 2021-03-02 LAB — CBC
HCT: 29.6 % — ABNORMAL LOW (ref 36.0–46.0)
Hemoglobin: 9.8 g/dL — ABNORMAL LOW (ref 12.0–15.0)
MCH: 31 pg (ref 26.0–34.0)
MCHC: 33.1 g/dL (ref 30.0–36.0)
MCV: 93.7 fL (ref 80.0–100.0)
Platelets: 351 10*3/uL (ref 150–400)
RBC: 3.16 MIL/uL — ABNORMAL LOW (ref 3.87–5.11)
RDW: 15 % (ref 11.5–15.5)
WBC: 16.3 10*3/uL — ABNORMAL HIGH (ref 4.0–10.5)
nRBC: 0.1 % (ref 0.0–0.2)

## 2021-03-02 LAB — CULTURE, BLOOD (ROUTINE X 2)
Culture: NO GROWTH
Culture: NO GROWTH
Special Requests: ADEQUATE
Special Requests: ADEQUATE

## 2021-03-02 LAB — TSH: TSH: 0.185 u[IU]/mL — ABNORMAL LOW (ref 0.350–4.500)

## 2021-03-02 LAB — GLUCOSE, CAPILLARY
Glucose-Capillary: 40 mg/dL — CL (ref 70–99)
Glucose-Capillary: 187 mg/dL — ABNORMAL HIGH (ref 70–99)
Glucose-Capillary: 220 mg/dL — ABNORMAL HIGH (ref 70–99)
Glucose-Capillary: 113 mg/dL — ABNORMAL HIGH (ref 70–99)
Glucose-Capillary: 83 mg/dL (ref 70–99)

## 2021-03-02 MED ORDER — INSULIN ASPART 100 UNIT/ML IJ SOLN: 0.0000 [IU] | Freq: Every day | INTRAMUSCULAR | Status: DC

## 2021-03-02 MED ORDER — INSULIN ASPART 100 UNIT/ML IJ SOLN
0.0000 [IU] | Freq: Three times a day (TID) | INTRAMUSCULAR | Status: DC
  Administered 2021-03-02: 5 [IU] via SUBCUTANEOUS
  Administered 2021-03-03: 11 [IU] via SUBCUTANEOUS
  Administered 2021-03-03: 5 [IU] via SUBCUTANEOUS

## 2021-03-02 MED ORDER — POTASSIUM CHLORIDE CRYS ER 20 MEQ PO TBCR
40.0000 meq | EXTENDED_RELEASE_TABLET | Freq: Once | ORAL | Status: AC
  Administered 2021-03-02: 40 meq via ORAL
  Filled 2021-03-02: qty 2

## 2021-03-02 NOTE — Progress Notes (Signed)
Patient Demographics:    Lindsay Cortez, is a 80 y.o. female, DOB - 1940/12/29, UXL:[id number]  Admit date - 02/24/2021   Admitting Physician Bernadette Hoit, DO  Outpatient Primary MD for the patient is Redmond School, MD  LOS - 6  Chief Complaint  Patient presents with   Shortness of Breath        Subjective:    Lindsay Cortez overall she is feeling better.  She continues to have cough.  Still has shortness of breath on exertion.  Currently on 3 L of oxygen -  Assessment  & Plan :    Principal Problem:   Sepsis due to community-acquired pneumonia Active Problems:   COPD (chronic obstructive pulmonary disease) (HCC)   Dehydration   Acute respiratory failure with hypoxia (HCC)   Chronic kidney disease   Leukocytosis   CAP (community acquired pneumonia)   Hypoalbuminemia   Hypothyroidism   Hyperglycemia due to diabetes mellitus ([place])   Essential hypertension  Brief Summary:-  80 y.o. female with medical history significant for hypertension, COPD, T2DM, hypothyroidism, hyperlipidemia, CKD stage IV admitted on 02/24/2021 with sepsis and CAP pneumonia resulting in acute hypoxic respiratory failure  A/p 1) sepsis secondary to community-acquired pneumonia- POA -Patient met sepsis criteria on admission with fevers above 102, tachycardia with heart rate of 115, leukocytosis , tachypnea with respiratory rate over 29, hypoxia with O2 sats of 87%, -PCT 12.39 -Chest x-ray consistent with right-sided pneumonia -Continue IV Rocephin and azithromycin, mucolytics and bronchodilators as ordered -Lactic acid 1.9, repeat 1.2` -Patient remains very symptomatic from respiratory standpoint, cough, dyspnea and hypoxia persist - She desaturated with ambulation even on 4 L of oxygen   -Repeat chest x-ray on 02/28/2021 showed improving pneumonia -We will repeat chest x-ray on 03/02/2021 -Given persistent hypoxia and  tachycardia with significant dyspnea PE rule was done with negative VQ scan and negative lower extremity Dopplers, unable to do CTA chest due to renal concerns  2)CAP--treat as above #1 -She is completed a course of antibiotics  3)Acute hypoxic respiratory failure--secondary to #1 and #2 above -Unable to wean off oxygen at this time- please see 1 above ---Unable to do CTA chest due to elevated creatinine -VQ scan without evidence of PE -Lower extremity Dopplers without evidence of DVT  -With concurrent COPD, may need to discharge home with supplemental oxygen  4)DM2- A1C is 9.8----reflecting uncontrolled diabetes with hyperglycemia PTA --continue to hold glimepiride -Episode of hypoglycemia on 03/01/2021- insulin regimen adjusted Use Novolog/Humalog Sliding scale insulin with Accu-Cheks/Fingersticks as ordered   5)AKI----acute kidney injury on CKD stage -  IV -Creatinine is down to 1.34  from 1.82 on admission -Hyperkalemia has resolved after [place]  renally adjust medications, avoid nephrotoxic agents / dehydration  / hypotension  6) hypothyroidism--continue levothyroxine  7) acute COPD exacerbation--- due to #1 #2 above, -Manage as above #1 #2 -Solu-Medrol as ordered  8)HTN- c/n  Toprol-XL and Amlodipine  9)Acute Anemia--no bleeding concerns, suspect this is due to hemodilution -Hemoglobin down to 9.4  10) tachyarrhythmia with persistent hypoxia--- echo shows normal ejection fraction -TSH mildly low at 0.18   Disposition/Need for in-Hospital Stay- patient unable to be discharged at this time due to --sepsis secondary to pneumonia requiring IV antibiotics and IV fluids as  well as supplemental oxygen due to acute hypoxic respiratory failure--profound hypoxia persist --Discharge home when able to ambulate without desaturating on 4 Lof oxygen via [place], patient declines SNF  Status is: Inpatient  Remains inpatient appropriate because: Please see disposition above  Disposition:  The patient is from: Home              Anticipated d/c is to: Home              Anticipated d/c date is: 2 days              Patient currently is not medically stable to d/c. Barriers: Not Clinically Stable-  --Discharge home when able to ambulate without desaturating on 4 Lof oxygen via [place], patient declines SNF  Code Status : -  Code Status: Full Code   Family Communication:    NA (patient is alert, awake and coherent)  Discussed with sister  Consults  :  na  DVT Prophylaxis  :   - SCDs   heparin injection 5,000 Units Start: 03/01/21 2200 Place TED hose Start: 02/27/21 1006 SCDs Start: 02/24/21 2053   Lab Results  Component Value Date   PLT 351 03/02/2021    Inpatient Medications  Scheduled Meds:  amLODipine  5 mg Oral Daily   cholecalciferol  2,000 Units Oral QPM   dextromethorphan-guaiFENesin  1 tablet Oral BID   heparin injection (subcutaneous)  5,000 Units Subcutaneous Q8H   insulin aspart  0-15 Units Subcutaneous TID WC   insulin aspart  0-5 Units Subcutaneous QHS   insulin aspart  3 Units Subcutaneous TID WC   insulin glargine  15 Units Subcutaneous Daily   ipratropium-albuterol  3 mL Nebulization BID   levothyroxine  75 mcg Oral Q0600   methylPREDNISolone (SOLU-MEDROL) injection  40 mg Intravenous Q12H   metoprolol succinate  50 mg Oral Daily   mometasone-formoterol  2 puff Inhalation BID   sodium bicarbonate  650 mg Oral BID   Continuous Infusions:   PRN Meds:.acetaminophen, albuterol, albuterol, guaiFENesin-dextromethorphan, loperamide  Anti-infectives (From admission, onward)    Start     Dose/Rate Route Frequency Ordered Stop   02/24/21 1830  cefTRIAXone (ROCEPHIN) 2 g in sodium chloride 0.9 % 100 mL IVPB        2 g 200 mL/hr over 30 Minutes Intravenous Every 24 hours 02/24/21 1818 03/02/21 1812   02/24/21 1830  azithromycin (ZITHROMAX) 500 mg in sodium chloride 0.9 % 250 mL IVPB        500 mg 250 mL/hr over 60 Minutes Intravenous Every 24 hours  02/24/21 1818 03/02/21 1930         Objective:   Vitals:   03/02/21 0835 03/02/21 0839 03/02/21 1336 03/02/21 1923  BP:   129/63   Pulse:   89   Resp:   18   Temp:   97.9 F (36.6 C)   TempSrc:   Oral   SpO2: 93% 96% 96% 93%  Weight:      Height:        Wt Readings from Last 3 Encounters:  02/25/21 63.8 kg  02/11/21 61.3 kg  01/28/21 63.6 kg     Intake/Output Summary (Last 24 hours) at 03/02/2021 2056 Last data filed at 03/02/2021 1700 Gross per 24 hour  Intake 840 ml  Output --  Net 840 ml     Physical Exam  General exam: Alert, awake, oriented x 3 Respiratory system: Clear to auscultation. Respiratory effort normal. Cardiovascular system:RRR. No murmurs, rubs, gallops.  Gastrointestinal system: Abdomen is nondistended, soft and nontender. No organomegaly or masses felt. Normal bowel sounds heard. Central nervous system: Alert and oriented. No focal neurological deficits. Extremities: Left arm is edematous Skin: No rashes, lesions or ulcers Psychiatry: Judgement and insight appear normal. Mood & affect appropriate.     Data Review:   Micro Results Recent Results (from the past 240 hour(s))  Resp Panel by RT-PCR (Flu A&B, Covid) Nasopharyngeal Swab     Status: None   Collection Time: 02/24/21  6:18 PM   Specimen: Nasopharyngeal Swab; Nasopharyngeal(NP) swabs in vial transport medium  Result Value Ref Range Status   SARS Coronavirus 2 by RT PCR NEGATIVE NEGATIVE Final    Comment: (NOTE) SARS-CoV-2 target nucleic acids are NOT DETECTED.  The SARS-CoV-2 RNA is generally detectable in upper respiratory specimens during the acute phase of infection. The lowest concentration of SARS-CoV-2 viral copies this assay can detect is 138 copies/mL. A negative result does not preclude SARS-Cov-2 infection and should not be used as the sole basis for treatment or other patient management decisions. A negative result may occur with  improper specimen collection/handling,  submission of specimen other than nasopharyngeal swab, presence of viral mutation(s) within the areas targeted by this assay, and inadequate number of viral copies(<138 copies/mL). A negative result must be combined with clinical observations, patient history, and epidemiological information. The expected result is Negative.  Fact Sheet for Patients:  EntrepreneurPulse.com.au  Fact Sheet for Healthcare Providers:  IncredibleEmployment.be  This test is no t yet approved or cleared by the [place] FDA and  has been authorized for detection and/or diagnosis of SARS-CoV-2 by FDA under an Emergency Use Authorization (EUA). This EUA will remain  in effect (meaning this test can be used) for the duration of the COVID-19 declaration under Section 564(b)(1) of the Act, 21 U.S.C.section 360bbb-3(b)(1), unless the authorization is terminated  or revoked sooner.       Influenza A by PCR NEGATIVE NEGATIVE Final   Influenza B by PCR NEGATIVE NEGATIVE Final    Comment: (NOTE) The Xpert Xpress SARS-CoV-2/FLU/RSV plus assay is intended as an aid in the diagnosis of influenza from Nasopharyngeal swab specimens and should not be used as a sole basis for treatment. Nasal washings and aspirates are unacceptable for Xpert Xpress SARS-CoV-2/FLU/RSV testing.  Fact Sheet for Patients: EntrepreneurPulse.com.au  Fact Sheet for Healthcare Providers: IncredibleEmployment.be  This test is not yet approved or cleared by the [place] FDA and has been authorized for detection and/or diagnosis of SARS-CoV-2 by FDA under an Emergency Use Authorization (EUA). This EUA will remain in effect (meaning this test can be used) for the duration of the COVID-19 declaration under Section 564(b)(1) of the Act, 21 U.S.C. section 360bbb-3(b)(1), unless the authorization is terminated or revoked.  Performed at [facility], [address], [place], [place] [address]   Blood Culture (routine x 2)     Status: None   Collection Time: 02/24/21  6:18 PM   Specimen: Right Antecubital; Blood  Result Value Ref Range Status   Specimen Description   Final    RIGHT ANTECUBITAL BOTTLES DRAWN AEROBIC AND ANAEROBIC   Special Requests Blood Culture adequate volume  Final   Culture   Final    NO GROWTH 6 DAYS Performed at [facility], [address], [place], [place] [address]    Report Status 03/02/2021 FINAL  Final  Blood Culture (routine x 2)     Status: None   Collection Time: 02/24/21  6:23 PM   Specimen: BLOOD RIGHT WRIST  Result Value Ref Range Status   Specimen Description   Final    BLOOD RIGHT WRIST BOTTLES DRAWN AEROBIC AND ANAEROBIC   Special Requests Blood Culture adequate volume  Final   Culture   Final    NO GROWTH 6 DAYS Performed at [facility], [address] [place], [place] [address]    Report Status 03/02/2021 FINAL  Final  Urine culture     Status: None   Collection Time: 02/25/21  6:18 AM   Specimen: In/Out Cath Urine  Result Value Ref Range Status   Specimen Description   Final    IN/OUT CATH URINE Performed at [facility], [address], [place], [place] [address]    Special Requests   Final    NONE Performed at [facility], [address] [place], [place] [address]    Culture   Final    NO GROWTH Performed at [facility], [place] [address], [place], [place] [address]    Report Status 02/27/2021 FINAL  Final    Radiology Reports DG Chest 2 View  Result Date: 03/02/2021 CLINICAL DATA:  Shortness of breath. EXAM: CHEST - 2 VIEW COMPARISON:  02/28/2021. FINDINGS: Mediastinum and hilar structures normal. Heart size normal. Low lung volumes with bilateral subsegmental atelectasis. Interim near complete resolution of right mid lung infiltrate. Small bilateral pleural effusions. No pneumothorax. Degenerative change thoracic spine. IMPRESSION: Low lung volumes with bibasilar subsegmental  atelectasis. Interim near complete resolution of right mid lung infiltrate. Small bilateral pleural effusion again noted s. Electronically Signed   By: Marcello Moores  Register   On: 03/02/2021 07:55   DG Chest 2 View  Result Date: 02/28/2021 CLINICAL DATA:  Dyspnea and respiratory abnormalities. EXAM: CHEST - 2 VIEW COMPARISON:  02/24/2021 and 09/01/2014 FINDINGS: Decreased patchy densities in the right perihilar region and right upper lung. There continues to be few patchy densities in the central aspect of the right chest. Blunting at the costophrenic angles and findings are suggestive for small effusions. Lateral view is limited because the arms are not elevated. Densities in the midthoracic spine on the lateral view are probably related degenerative changes based on previous examination. Atherosclerotic calcifications at the aortic arch. IMPRESSION: Persistent but decreasing patchy densities in the right lung. Findings are suggestive for resolving pneumonia. Small pleural effusions. Electronically Signed   By: Markus Daft M.D.   On: 02/28/2021 08:53   NM Pulmonary Perfusion  Result Date: 02/28/2021 CLINICAL DATA:  Suspected pulmonary embolus. Three day history of shortness of breath. EXAM: NUCLEAR MEDICINE PERFUSION LUNG SCAN TECHNIQUE: Perfusion images were obtained in multiple projections after intravenous injection of radiopharmaceutical. Ventilation scans intentionally deferred if perfusion scan and chest x-ray adequate for interpretation during COVID 19 epidemic. RADIOPHARMACEUTICALS:  4.2 mCi Tc-74mMAA IV COMPARISON:  Chest x-ray today FINDINGS: There is normal perfusion. No pleural based wedge shaped defects are identified. IMPRESSION: Normal perfusion. Electronically Signed   By: ENolon NationsM.D.   On: 02/28/2021 13:44   UK[place]Venous Img Lower Bilateral (DVT)  Result Date: 03/01/2021 CLINICAL DATA:  Pulmonary embolism EXAM: BILATERAL LOWER EXTREMITY VENOUS DOPPLER ULTRASOUND TECHNIQUE: Gray-scale  sonography with compression, as well as color and duplex ultrasound, were performed to evaluate the deep venous system(s) from the level of the common femoral vein through the popliteal and proximal calf veins. COMPARISON:  None. FINDINGS: VENOUS Normal compressibility of BILATERAL common femoral, superficial femoral, and popliteal veins, as well as the visualized  calf veins. Visualized portions of BILATERAL profunda femoral vein and great saphenous vein unremarkable. No filling defects to suggest DVT on grayscale or color Doppler imaging. Doppler waveforms show normal direction of venous flow, normal respiratory plasticity and response to augmentation. OTHER None. Limitations: none IMPRESSION: No evidence of deep venous thrombosis in either lower extremity. Electronically Signed   By: Lavonia Dana M.D.   On: 03/01/2021 11:45   DG Chest Port 1 View  Result Date: 02/24/2021 CLINICAL DATA:  Questionable sepsis, shortness of breath EXAM: PORTABLE CHEST 1 VIEW COMPARISON:  09/01/2014 FINDINGS: Patchy airspace disease throughout the right lung concerning for pneumonia. Left basilar scarring. Heart is borderline in size. No effusions or acute bony abnormality. IMPRESSION: Patchy right lung airspace disease concerning for pneumonia. Electronically Signed   By: Rolm Baptise M.D.   On: 02/24/2021 19:26   ECHOCARDIOGRAM COMPLETE  Result Date: 03/01/2021    ECHOCARDIOGRAM REPORT   Patient Name:   Lindsay Cortez Date of Exam: 03/01/2021 Medical Rec #:  [id number]       Height:       55.0 in Accession #:    [id number]      Weight:       140.6 lb Date of Birth:  10/24/1940      BSA:          1.509 m Patient Age:    89 years        BP:           143/85 mmHg Patient Gender: F               HR:           107 bpm. Exam Location:  [facility] Procedure: 2D Echo, Cardiac Doppler and Color Doppler Indications:    Dyspnea  History:        Patient has no prior history of Echocardiogram examinations.                 COPD; Risk  Factors:Hypertension and Diabetes.  Sonographer:    Wenda Low Referring Phys: [place]  1. Left ventricular ejection fraction, by estimation, is 65 to 70%. The left ventricle has normal function. The left ventricle has no regional wall motion abnormalities. Left ventricular diastolic parameters are indeterminate.  2. Right ventricular systolic function is normal. The right ventricular size is normal. There is mildly elevated pulmonary artery systolic pressure.  3. Left atrial size was mildly dilated.  4. The mitral valve is degenerative. Trivial mitral valve regurgitation. No evidence of mitral stenosis. Moderate mitral annular calcification.  5. The aortic valve was not well visualized. There is moderate calcification of the aortic valve. There is moderate thickening of the aortic valve. Aortic valve regurgitation is not visualized. Mild to moderate aortic valve sclerosis/calcification is present, without any evidence of aortic stenosis.  6. The inferior vena cava is dilated in size with >50% respiratory variability, suggesting right atrial pressure of 8 mmHg. FINDINGS  Left Ventricle: Left ventricular ejection fraction, by estimation, is 65 to 70%. The left ventricle has normal function. The left ventricle has no regional wall motion abnormalities. The left ventricular internal cavity size was normal in size. There is  no left ventricular hypertrophy. Left ventricular diastolic parameters are indeterminate. Right Ventricle: The right ventricular size is normal. No increase in right ventricular wall thickness. Right ventricular systolic function is normal. There is mildly elevated pulmonary artery systolic pressure. The tricuspid regurgitant velocity is 3.03  m/s, and with  an assumed right atrial pressure of 3 mmHg, the estimated right ventricular systolic pressure is 58.5 mmHg. Left Atrium: Left atrial size was mildly dilated. Right Atrium: Right atrial size was normal in size.  Pericardium: There is no evidence of pericardial effusion. Mitral Valve: The mitral valve is degenerative in appearance. There is moderate thickening of the mitral valve leaflet(s). There is moderate calcification of the mitral valve leaflet(s). Moderate mitral annular calcification. Trivial mitral valve regurgitation. No evidence of mitral valve stenosis. MV peak gradient, 11.0 mmHg. The mean mitral valve gradient is 5.0 mmHg. Tricuspid Valve: The tricuspid valve is normal in structure. Tricuspid valve regurgitation is mild . No evidence of tricuspid stenosis. Aortic Valve: The aortic valve was not well visualized. There is moderate calcification of the aortic valve. There is moderate thickening of the aortic valve. Aortic valve regurgitation is not visualized. Mild to moderate aortic valve sclerosis/calcification is present, without any evidence of aortic stenosis. Aortic valve mean gradient measures 8.0 mmHg. Aortic valve peak gradient measures 17.8 mmHg. Aortic valve area, by VTI measures 2.14 cm. Pulmonic Valve: The pulmonic valve was normal in structure. Pulmonic valve regurgitation is mild. No evidence of pulmonic stenosis. Aorta: The aortic root is normal in size and structure. Venous: The inferior vena cava is dilated in size with greater than 50% respiratory variability, suggesting right atrial pressure of 8 mmHg. IAS/Shunts: No atrial level shunt detected by color flow Doppler.  LEFT VENTRICLE PLAX 2D LVIDd:         5.22 cm LVIDs:         3.23 cm LV PW:         0.96 cm LV IVS:        0.89 cm LVOT diam:     2.00 cm LV SV:         85 LV SV Index:   56 LVOT Area:     3.14 cm  RIGHT VENTRICLE RV Basal diam:  3.58 cm RV Mid diam:    2.65 cm RV S prime:     14.30 cm/s TAPSE (M-mode): 2.1 cm LEFT ATRIUM             Index       RIGHT ATRIUM           Index LA diam:        4.10 cm 2.72 cm/m  RA Area:     14.60 cm LA Vol (A2C):   51.2 ml 33.94 ml/m RA Volume:   39.20 ml  25.98 ml/m LA Vol (A4C):   48.8 ml  32.34 ml/m LA Biplane Vol: 51.2 ml 33.94 ml/m  AORTIC VALVE AV Area (Vmax):    2.01 cm AV Area (Vmean):   2.20 cm AV Area (VTI):     2.14 cm AV Vmax:           211.00 cm/s AV Vmean:          128.000 cm/s AV VTI:            0.398 m AV Peak Grad:      17.8 mmHg AV Mean Grad:      8.0 mmHg LVOT Vmax:         135.00 cm/s LVOT Vmean:        89.500 cm/s LVOT VTI:          0.271 m LVOT/AV VTI ratio: 0.68  AORTA Ao Root diam: 3.00 cm MITRAL VALVE  TRICUSPID VALVE MV Area (PHT): 4.24 cm     TR Peak grad:   36.7 mmHg MV Area VTI:   2.74 cm     TR Vmax:        303.00 cm/s MV Peak grad:  11.0 mmHg MV Mean grad:  5.0 mmHg     SHUNTS MV Vmax:       1.66 m/s     Systemic VTI:  0.27 m MV Vmean:      107.0 cm/s   Systemic Diam: 2.00 cm MV Decel Time: 179 msec MV E velocity: 87.30 cm/s MV A velocity: 147.00 cm/s MV E/A ratio:  0.59 Jenkins Rouge MD Electronically signed by Jenkins Rouge MD Signature Date/Time: 03/01/2021/11:06:15 AM    Final      CBC Recent Labs  Lab 02/24/21 1818 02/25/21 0349 02/26/21 0705 02/28/21 0650 03/01/21 0903 03/02/21 0438  WBC 21.4* 20.5* 11.2* 11.1* 11.2* 16.3*  HGB 11.3* 10.0* 10.3* 9.8* 9.4* 9.8*  HCT 34.1* 31.0* 32.3* 30.3* 28.3* 29.6*  PLT 231 188 234 287 322 351  MCV 94.7 97.8 97.3 95.3 94.6 93.7  MCH 31.4 31.5 31.0 30.8 31.4 31.0  MCHC 33.1 32.3 31.9 32.3 33.2 33.1  RDW 14.5 14.5 14.8 14.8 15.2 15.0  LYMPHSABS 0.5*  --   --   --   --   --   MONOABS 1.9*  --   --   --   --   --   EOSABS 0.0  --   --   --   --   --   BASOSABS 0.1  --   --   --   --   --     Chemistries  Recent Labs  Lab 02/24/21 1818 02/25/21 0349 02/26/21 0705 02/27/21 0427 02/28/21 0650 03/01/21 0903 03/02/21 0438  NA 133* 136 136 134* 134* 136 140  K 3.5 3.4* 5.2* 3.8 3.9 3.3* 3.2*  CL 101 107 107 106 107 106 108  CO2 22 21* 18* 16* 19* 20* 21*  GLUCOSE 238* 152* 337* 289* 233* 353* 164*  BUN _0 25* 27* 28*  CREATININE 1.82* 1.69* 1.76* 1.60* 1.46* 1.51* 1.34*   CALCIUM 7.8* 6.8* 7.6* 7.7* 7.2* 7.6* 8.0*  MG  --  1.7  --   --   --   --   --   AST 18 14* 25  --   --   --   --   ALT _1 --   --   --   --   ALKPHOS 133* 107 149*  --   --   --   --   BILITOT 1.1 0.6 0.4  --   --   --   --    ------------------------------------------------------------------------------------------------------------------ No results for input(s): CHOL, HDL, LDLCALC, TRIG, CHOLHDL, LDLDIRECT in the last 72 hours.  Lab Results  Component Value Date   HGBA1C 9.8 (H) 02/24/2021   ------------------------------------------------------------------------------------------------------------------ Recent Labs    03/02/21 0438  TSH 0.185*   ------------------------------------------------------------------------------------------------------------------ No results for input(s): VITAMINB12, FOLATE, FERRITIN, TIBC, IRON, RETICCTPCT in the last 72 hours.  Coagulation profile Recent Labs  Lab 02/24/21 1818 02/25/21 0349  INR 1.1 1.2    No results for input(s): DDIMER in the last 72 hours.  Cardiac Enzymes No results for input(s): CKMB, TROPONINI, MYOGLOBIN in the last 168 hours.  Invalid input(s): CK ------------------------------------------------------------------------------------------------------------------ No results found for: BNP   Kathie Dike M.D on 03/02/2021 at 8:56 PM  Go to [email or web address] -  for contact info  [facility] - Office  [phone]

## 2021-03-03 ENCOUNTER — Inpatient Hospital Stay (HOSPITAL_COMMUNITY): Payer: PPO

## 2021-03-03 LAB — BASIC METABOLIC PANEL
Anion gap: 9 (ref 5–15)
BUN: 29 mg/dL — ABNORMAL HIGH (ref 8–23)
CO2: 22 mmol/L (ref 22–32)
Calcium: 7.7 mg/dL — ABNORMAL LOW (ref 8.9–10.3)
Chloride: 106 mmol/L (ref 98–111)
Creatinine, Ser: 1.33 mg/dL — ABNORMAL HIGH (ref 0.44–1.00)
GFR, Estimated: 40 mL/min — ABNORMAL LOW (ref >60)
Glucose, Bld: 199 mg/dL — ABNORMAL HIGH (ref 70–99)
Potassium: 3.6 mmol/L (ref 3.5–5.1)
Sodium: 137 mmol/L (ref 135–145)

## 2021-03-03 LAB — GLUCOSE, CAPILLARY
Glucose-Capillary: 232 mg/dL — ABNORMAL HIGH (ref 70–99)
Glucose-Capillary: 344 mg/dL — ABNORMAL HIGH (ref 70–99)

## 2021-03-03 MED ORDER — DM-GUAIFENESIN ER 30-600 MG PO TB12: 1.0000 | ORAL_TABLET | Freq: Two times a day (BID) | ORAL | 0 refills | Status: DC

## 2021-03-03 MED ORDER — FUROSEMIDE 10 MG/ML IJ SOLN
20.0000 mg | Freq: Once | INTRAMUSCULAR | Status: AC
  Administered 2021-03-03: 20 mg via INTRAVENOUS
  Filled 2021-03-03: qty 2

## 2021-03-03 MED ORDER — PREDNISONE 10 MG PO TABS: ORAL_TABLET | ORAL | 0 refills | Status: DC

## 2021-03-03 NOTE — Care Management Important Message (Signed)
Important Message  Patient Details  Name: Lindsay Cortez MRN: [id number] Date of Birth: 1940-11-06   Medicare Important Message Given:  Yes     Tommy Medal 03/03/2021, 1:40 PM

## 2021-03-03 NOTE — Progress Notes (Signed)
SATURATION QUALIFICATIONS: (This note is used to comply with regulatory documentation for home oxygen) ° °Patient Saturations on Room Air at Rest = 95% ° °Patient Saturations on Room Air while Ambulating = 92% ° °Patient Saturations on 0 Liters of oxygen while Ambulating = 92% ° °Please briefly explain why patient needs home oxygen: N/A °

## 2021-03-03 NOTE — Progress Notes (Signed)
Inpatient Diabetes Program Recommendations  AACE/ADA: New Consensus Statement on Inpatient Glycemic Control (2015)  Target Ranges:  Prepandial:   less than 140 mg/dL      Peak postprandial:   less than 180 mg/dL (1-2 hours)      Critically ill patients:  140 - 180 mg/dL   Lab Results  Component Value Date   GLUCAP 232 (H) 03/03/2021   HGBA1C 9.8 (H) 02/24/2021    Review of Glycemic Control Results for SAMAA, UEDA (MRN [id number]) as of 03/03/2021 09:17  Ref. Range 03/02/2021 07:14 03/02/2021 11:15 03/02/2021 16:01 03/02/2021 21:05 03/03/2021 07:08  Glucose-Capillary Latest Ref Range: 70 - 99 mg/dL 187 (H)  Novolog 7 units 220 (H)  Novolog 8 units 113 (H)  Novolog 3 units 83 232 (H)  Novolog 8 units   Diabetes history: DM2 Outpatient Diabetes medications: Amaryl 2 mg in am and 1 mg QPM Current orders for Inpatient glycemic control: Lantus 15 units QD, Novolog 0-15 units TID with meals and 0-5 HS + 3 units TID  On Solumedrol 40 mg Q12H HgbA1C - 9.8% Had hypoglycemia in 40s this afternoon  Inpatient Diabetes Program Recommendations:    Fasting glucose elevated above goal  -   Increase Lantus to 18 units -   Decrease Novolog to 0-9 units TID with meals and 0-5 HS  -   Add CHO mod med to heart healthy diet  Follow glucose trends.  Thank you. Tama Headings RN, MSN, BC-ADM Inpatient Diabetes Coordinator Team Pager [phone] (8a-5p)

## 2021-03-03 NOTE — TOC Transition Note (Signed)
Transition of Care [facility]) - CM/SW Discharge Note   Patient Details  Name: Lindsay Cortez MRN: [id number] Date of Birth: 16-Apr-1941  Transition of Care [facility] CM/SW Contact:  Ihor Gully, LCSW Phone Number: 03/03/2021, 3:02 PM   Clinical Narrative:    TOP unable to find accepting [facility] provider. Patient declined by [facility], [facility], [facility] and [facility] due to insurance. Patient referred to [facility] in [place].    Final next level of care: [facility] [place] Barriers to Discharge: Continued Medical Work up   Patient Goals and CMS Choice Patient states their goals for this hospitalization and ongoing recovery are:: Return home with [facility] CMS Medicare.gov Compare Post Acute Care list provided to:: Patient Choice offered to / list presented to : Patient  Discharge Placement                       Discharge Plan and Services                                     Social Determinants of Health (SDOH) Interventions     Readmission Risk Interventions Readmission Risk Prevention Plan 03/01/2021  Transportation Screening Complete  PCP or Specialist Appt within 3-5 Days Complete  HRI or [place] Complete  Social Work Consult for [place] Planning/Counseling Complete  Palliative Care Screening Not Applicable  Medication Review [employer]) Complete  Some recent data might be hidden

## 2021-03-03 NOTE — Discharge Summary (Signed)
Physician Discharge Summary  Lindsay Cortez SHF:[id number] DOB: Jul 17, 1941 DOA: 02/24/2021  PCP: Lindsay School, MD  Admit date: 02/24/2021 Discharge date: 03/03/2021  Admitted From: home Disposition:  home  Recommendations for Outpatient Follow-up:  Follow up with PCP in 1-2 weeks Please obtain BMP/CBC in one week  Home Health:home health PT Equipment/Devices:  Discharge Condition:stable CODE STATUS:full code Diet recommendation: heart healthy  Brief/Interim Summary: 80 year old female with a history of hypertension, COPD, diabetes, chronic kidney disease stage IV, was admitted to the hospital with community-acquired pneumonia, sepsis and acute respiratory failure with hypoxia.  Discharge Diagnoses:  Principal Problem:   Sepsis due to community-acquired pneumonia Active Problems:   COPD (chronic obstructive pulmonary disease) ([place])   Dehydration   Acute respiratory failure with hypoxia ([facility])   Chronic kidney disease   Leukocytosis   CAP (community acquired pneumonia)   Hypoalbuminemia   Hypothyroidism   Hyperglycemia due to diabetes mellitus ([place])   Essential hypertension  Sepsis secondary to community-acquired pneumonia -Patient noted to be febrile, tachycardic, tachypneic on admission -Chest x-ray consistent with right-sided pneumonia -She completed a course of Rocephin and azithromycin in the hospital -Since then, his respiratory status has improved -She is no longer febrile and hemodynamics have stabilized  Acute respiratory failure with hypoxia secondary to pneumonia -Patient was requiring 4 L of oxygen -She has been weaned down to room air and is able to ambulate now without any supplemental oxygen -VQ scan negative for PE  COPD -She was treated with intravenous steroids and bronchodilators -Steroids have been transitioned to prednisone taper  Acute kidney injury on chronic kidney disease stage IV -Creatinine on admission 1.8, this improved to 1.3 with IV  fluids -Continue to follow as an outpatient  Discharge Instructions  Discharge Instructions     Ambulatory referral to Physical Therapy   Complete by: As directed    Diet - low sodium heart healthy   Complete by: As directed    Increase activity slowly   Complete by: As directed       Allergies as of 03/03/2021       Reactions   Fentanyl Nausea And Vomiting   Codeine    Narcotic pain medicine makes her nauseated.  Says was told to only take tylenol due to kidney disease.   Levaquin [levofloxacin In D5w] Nausea And Vomiting        Medication List     STOP taking these medications    lisinopril 2.5 MG tablet Commonly known as: ZESTRIL   lisinopril 5 MG tablet Commonly known as: ZESTRIL   predniSONE 10 MG (21) Tbpk tablet Commonly known as: STERAPRED UNI-PAK 21 TAB Replaced by: predniSONE 10 MG tablet       TAKE these medications    acetaminophen 500 MG tablet Commonly known as: TYLENOL Take 1,000 mg by mouth daily as needed (pain). For pain   albuterol 108 (90 Base) MCG/ACT inhaler Commonly known as: Ventolin HFA Inhale 2 puffs into the lungs every 4 (four) hours as needed for wheezing or shortness of breath. For rescue   albuterol (2.5 MG/3ML) 0.083% nebulizer solution Commonly known as: PROVENTIL Take 3 mLs (2.5 mg total) by nebulization every 4 (four) hours as needed for wheezing.   alendronate 70 MG tablet Commonly known as: FOSAMAX Take 70 mg by mouth once a week.   allopurinol 100 MG tablet Commonly known as: ZYLOPRIM Take 200 mg by mouth daily.   amLODipine 5 MG tablet Commonly known as: NORVASC Take 5 mg by mouth  daily.   budesonide-formoterol 80-4.5 MCG/ACT inhaler Commonly known as: SYMBICORT Inhale 2 puffs into the lungs 2 (two) times daily.   CALCIUM 600 + D PO Take 1 tablet by mouth daily.   cyclobenzaprine 5 MG tablet Commonly known as: FLEXERIL Take 1 tablet (5 mg total) by mouth at bedtime.   dextromethorphan-guaiFENesin  30-600 MG 12hr tablet Commonly known as: MUCINEX DM Take 1 tablet by mouth 2 (two) times daily.   glimepiride 2 MG tablet Commonly known as: AMARYL Take 2 mg by mouth daily with breakfast. 2 in the morning and 1 at bedtime   levothyroxine 75 MCG tablet Commonly known as: SYNTHROID Take 75 mcg by mouth daily.   loperamide 2 MG capsule Commonly known as: IMODIUM Take 2 mg by mouth every morning.   meclizine 25 MG tablet Commonly known as: ANTIVERT Take 1 tablet (25 mg total) by mouth 3 (three) times daily as needed for dizziness.   metoprolol succinate 50 MG 24 hr tablet Commonly known as: TOPROL-XL Take 50 mg by mouth daily.   predniSONE 10 MG tablet Commonly known as: DELTASONE Take 40mg  po daily for 2 days then 30mg  daily for 2 days then 20mg  daily for 2 days then 10mg  daily for 2 days then stop Replaces: predniSONE 10 MG (21) Tbpk tablet   promethazine 25 MG suppository Commonly known as: PHENERGAN Place 1 suppository (25 mg total) rectally every 6 (six) hours as needed for nausea or vomiting.   simvastatin 10 MG tablet Commonly known as: ZOCOR Take 10 mg by mouth at bedtime.   sodium bicarbonate 650 MG tablet Take by mouth 3 (three) times daily.   Vitamin D 50 MCG (2000 UT) Caps Take 1 capsule by mouth every evening.               Durable Medical Equipment  (From admission, onward)           Start     Ordered   02/28/21 1736  For home use only DME oxygen  Once       Comments: SATURATION QUALIFICATIONS: (This note is used to comply with regulatory documentation for home oxygen)   Patient Saturations on Room Air at Rest = 85 %   Patient Saturations on Room Air while Ambulating = 81 %   Patient Saturations on 4 Liters of oxygen while Ambulating = 88 %      Patient needs continuous O2 at 4 L/min continuously via nasal cannula with humidifier, with gaseous portability and conserving device  Question Answer Comment  Length of Need Lifetime   Mode  or (Route) Nasal cannula   Liters per Minute 4   Frequency Continuous (stationary and portable oxygen unit needed)   Oxygen conserving device Yes   Oxygen delivery system Gas      02/28/21 1737            Follow-up Information     Lindsay School, MD. Schedule an appointment as soon as possible for a visit in 2 week(s).   Specialty: Internal Medicine Contact information: [address] [place] [address] [phone]                Allergies  Allergen Reactions   Fentanyl Nausea And Vomiting   Codeine     Narcotic pain medicine makes her nauseated.  Says was told to only take tylenol due to kidney disease.   Levaquin [Levofloxacin In D5w] Nausea And Vomiting    Consultations:    Procedures/Studies: DG Chest 2  View  Result Date: 03/02/2021 CLINICAL DATA:  Shortness of breath. EXAM: CHEST - 2 VIEW COMPARISON:  02/28/2021. FINDINGS: Mediastinum and hilar structures normal. Heart size normal. Low lung volumes with bilateral subsegmental atelectasis. Interim near complete resolution of right mid lung infiltrate. Small bilateral pleural effusions. No pneumothorax. Degenerative change thoracic spine. IMPRESSION: Low lung volumes with bibasilar subsegmental atelectasis. Interim near complete resolution of right mid lung infiltrate. Small bilateral pleural effusion again noted s. Electronically Signed   By: Marcello Moores  Register   On: 03/02/2021 07:55   DG Chest 2 View  Result Date: 02/28/2021 CLINICAL DATA:  Dyspnea and respiratory abnormalities. EXAM: CHEST - 2 VIEW COMPARISON:  02/24/2021 and 09/01/2014 FINDINGS: Decreased patchy densities in the right perihilar region and right upper lung. There continues to be few patchy densities in the central aspect of the right chest. Blunting at the costophrenic angles and findings are suggestive for small effusions. Lateral view is limited because the arms are not elevated. Densities in the midthoracic spine on the lateral view  are probably related degenerative changes based on previous examination. Atherosclerotic calcifications at the aortic arch. IMPRESSION: Persistent but decreasing patchy densities in the right lung. Findings are suggestive for resolving pneumonia. Small pleural effusions. Electronically Signed   By: Markus Daft M.D.   On: 02/28/2021 08:53   NM Pulmonary Perfusion  Result Date: 02/28/2021 CLINICAL DATA:  Suspected pulmonary embolus. Three day history of shortness of breath. EXAM: NUCLEAR MEDICINE PERFUSION LUNG SCAN TECHNIQUE: Perfusion images were obtained in multiple projections after intravenous injection of radiopharmaceutical. Ventilation scans intentionally deferred if perfusion scan and chest x-ray adequate for interpretation during COVID 19 epidemic. RADIOPHARMACEUTICALS:  4.2 mCi Tc-70m MAA IV COMPARISON:  Chest x-ray today FINDINGS: There is normal perfusion. No pleural based wedge shaped defects are identified. IMPRESSION: Normal perfusion. Electronically Signed   By: Nolon Nations M.D.   On: 02/28/2021 13:44   US Venous Img Lower Bilateral (DVT)  Result Date: 03/01/2021 CLINICAL DATA:  Pulmonary embolism EXAM: BILATERAL LOWER EXTREMITY VENOUS DOPPLER ULTRASOUND TECHNIQUE: Gray-scale sonography with compression, as well as color and duplex ultrasound, were performed to evaluate the deep venous system(s) from the level of the common femoral vein through the popliteal and proximal calf veins. COMPARISON:  None. FINDINGS: VENOUS Normal compressibility of BILATERAL common femoral, superficial femoral, and popliteal veins, as well as the visualized calf veins. Visualized portions of BILATERAL profunda femoral vein and great saphenous vein unremarkable. No filling defects to suggest DVT on grayscale or color Doppler imaging. Doppler waveforms show normal direction of venous flow, normal respiratory plasticity and response to augmentation. OTHER None. Limitations: none IMPRESSION: No evidence of deep  venous thrombosis in either lower extremity. Electronically Signed   By: Lavonia Dana M.D.   On: 03/01/2021 11:45   US Venous Img Upper Uni Left (DVT)  Result Date: 03/03/2021 CLINICAL DATA:  Left upper extremity edema. History of diabetes. Evaluate for DVT. EXAM: LEFT UPPER EXTREMITY VENOUS DOPPLER ULTRASOUND TECHNIQUE: Gray-scale sonography with graded compression, as well as color Doppler and duplex ultrasound were performed to evaluate the upper extremity deep venous system from the level of the subclavian vein and including the jugular, axillary, basilic, radial, ulnar and upper cephalic vein. Spectral Doppler was utilized to evaluate flow at rest and with distal augmentation maneuvers. COMPARISON:  None. FINDINGS: Contralateral Subclavian Vein: Respiratory phasicity is normal and symmetric with the symptomatic side. No evidence of thrombus. Normal compressibility. Internal Jugular Vein: No evidence of thrombus. Normal compressibility, respiratory  phasicity and response to augmentation. Subclavian Vein: No evidence of thrombus. Normal compressibility, respiratory phasicity and response to augmentation. Axillary Vein: No evidence of thrombus. Normal compressibility, respiratory phasicity and response to augmentation. Cephalic Vein: No evidence of thrombus. Normal compressibility, respiratory phasicity and response to augmentation. Basilic Vein: No evidence of thrombus. Normal compressibility, respiratory phasicity and response to augmentation. Brachial Veins: No evidence of thrombus. Normal compressibility, respiratory phasicity and response to augmentation. Radial Veins: No evidence of thrombus. Normal compressibility, respiratory phasicity and response to augmentation. Ulnar Veins: No evidence of thrombus. Normal compressibility, respiratory phasicity and response to augmentation. Venous Reflux:  None visualized. Other Findings: There is a moderate large amount of subcutaneous edema the level of the forearm.  IMPRESSION: 1. No evidence of DVT within the left upper extremity. 2. Moderate to large amount of subcutaneous edema at the level of the forearm. Electronically Signed   By: Sandi Mariscal M.D.   On: 03/03/2021 10:26   DG Chest Port 1 View  Result Date: 02/24/2021 CLINICAL DATA:  Questionable sepsis, shortness of breath EXAM: PORTABLE CHEST 1 VIEW COMPARISON:  09/01/2014 FINDINGS: Patchy airspace disease throughout the right lung concerning for pneumonia. Left basilar scarring. Heart is borderline in size. No effusions or acute bony abnormality. IMPRESSION: Patchy right lung airspace disease concerning for pneumonia. Electronically Signed   By: Rolm Baptise M.D.   On: 02/24/2021 19:26   ECHOCARDIOGRAM COMPLETE  Result Date: 03/01/2021    ECHOCARDIOGRAM REPORT   Patient Name:   Lindsay Cortez Date of Exam: 03/01/2021 Medical Rec #:  [id number]       Height:       55.0 in Accession #:    [id number]      Weight:       140.6 lb Date of Birth:  1941/05/17      BSA:          1.509 m Patient Age:    80 years        BP:           143/85 mmHg Patient Gender: F               HR:           107 bpm. Exam Location:  [facility] Procedure: 2D Echo, Cardiac Doppler and Color Doppler Indications:    Dyspnea  History:        Patient has no prior history of Echocardiogram examinations.                 COPD; Risk Factors:Hypertension and Diabetes.  Sonographer:    Wenda Low Referring Phys: [place]  1. Left ventricular ejection fraction, by estimation, is 65 to 70%. The left ventricle has normal function. The left ventricle has no regional wall motion abnormalities. Left ventricular diastolic parameters are indeterminate.  2. Right ventricular systolic function is normal. The right ventricular size is normal. There is mildly elevated pulmonary artery systolic pressure.  3. Left atrial size was mildly dilated.  4. The mitral valve is degenerative. Trivial mitral valve regurgitation. No evidence of  mitral stenosis. Moderate mitral annular calcification.  5. The aortic valve was not well visualized. There is moderate calcification of the aortic valve. There is moderate thickening of the aortic valve. Aortic valve regurgitation is not visualized. Mild to moderate aortic valve sclerosis/calcification is present, without any evidence of aortic stenosis.  6. The inferior vena cava is dilated in size with >50% respiratory variability, suggesting right atrial pressure  of 8 mmHg. FINDINGS  Left Ventricle: Left ventricular ejection fraction, by estimation, is 65 to 70%. The left ventricle has normal function. The left ventricle has no regional wall motion abnormalities. The left ventricular internal cavity size was normal in size. There is  no left ventricular hypertrophy. Left ventricular diastolic parameters are indeterminate. Right Ventricle: The right ventricular size is normal. No increase in right ventricular wall thickness. Right ventricular systolic function is normal. There is mildly elevated pulmonary artery systolic pressure. The tricuspid regurgitant velocity is 3.03  m/s, and with an assumed right atrial pressure of 3 mmHg, the estimated right ventricular systolic pressure is 63.8 mmHg. Left Atrium: Left atrial size was mildly dilated. Right Atrium: Right atrial size was normal in size. Pericardium: There is no evidence of pericardial effusion. Mitral Valve: The mitral valve is degenerative in appearance. There is moderate thickening of the mitral valve leaflet(s). There is moderate calcification of the mitral valve leaflet(s). Moderate mitral annular calcification. Trivial mitral valve regurgitation. No evidence of mitral valve stenosis. MV peak gradient, 11.0 mmHg. The mean mitral valve gradient is 5.0 mmHg. Tricuspid Valve: The tricuspid valve is normal in structure. Tricuspid valve regurgitation is mild . No evidence of tricuspid stenosis. Aortic Valve: The aortic valve was not well visualized. There  is moderate calcification of the aortic valve. There is moderate thickening of the aortic valve. Aortic valve regurgitation is not visualized. Mild to moderate aortic valve sclerosis/calcification is present, without any evidence of aortic stenosis. Aortic valve mean gradient measures 8.0 mmHg. Aortic valve peak gradient measures 17.8 mmHg. Aortic valve area, by VTI measures 2.14 cm. Pulmonic Valve: The pulmonic valve was normal in structure. Pulmonic valve regurgitation is mild. No evidence of pulmonic stenosis. Aorta: The aortic root is normal in size and structure. Venous: The inferior vena cava is dilated in size with greater than 50% respiratory variability, suggesting right atrial pressure of 8 mmHg. IAS/Shunts: No atrial level shunt detected by color flow Doppler.  LEFT VENTRICLE PLAX 2D LVIDd:         5.22 cm LVIDs:         3.23 cm LV PW:         0.96 cm LV IVS:        0.89 cm LVOT diam:     2.00 cm LV SV:         85 LV SV Index:   56 LVOT Area:     3.14 cm  RIGHT VENTRICLE RV Basal diam:  3.58 cm RV Mid diam:    2.65 cm RV S prime:     14.30 cm/s TAPSE (M-mode): 2.1 cm LEFT ATRIUM             Index       RIGHT ATRIUM           Index LA diam:        4.10 cm 2.72 cm/m  RA Area:     14.60 cm LA Vol (A2C):   51.2 ml 33.94 ml/m RA Volume:   39.20 ml  25.98 ml/m LA Vol (A4C):   48.8 ml 32.34 ml/m LA Biplane Vol: 51.2 ml 33.94 ml/m  AORTIC VALVE AV Area (Vmax):    2.01 cm AV Area (Vmean):   2.20 cm AV Area (VTI):     2.14 cm AV Vmax:           211.00 cm/s AV Vmean:          128.000 cm/s AV VTI:  0.398 m AV Peak Grad:      17.8 mmHg AV Mean Grad:      8.0 mmHg LVOT Vmax:         135.00 cm/s LVOT Vmean:        89.500 cm/s LVOT VTI:          0.271 m LVOT/AV VTI ratio: 0.68  AORTA Ao Root diam: 3.00 cm MITRAL VALVE                TRICUSPID VALVE MV Area (PHT): 4.24 cm     TR Peak grad:   36.7 mmHg MV Area VTI:   2.74 cm     TR Vmax:        303.00 cm/s MV Peak grad:  11.0 mmHg MV Mean grad:  5.0  mmHg     SHUNTS MV Vmax:       1.66 m/s     Systemic VTI:  0.27 m MV Vmean:      107.0 cm/s   Systemic Diam: 2.00 cm MV Decel Time: 179 msec MV E velocity: 87.30 cm/s MV A velocity: 147.00 cm/s MV E/A ratio:  0.59 Jenkins Rouge MD Electronically signed by Jenkins Rouge MD Signature Date/Time: 03/01/2021/11:06:15 AM    Final       Subjective: Overall she is feeling better.  Feels that her breathing has improved.  She is able to ambulate on room air and did not become hypoxic.  Discharge Exam: Vitals:   03/03/21 0749 03/03/21 0904 03/03/21 0918 03/03/21 1309  BP:  135/73 135/73 (!) 137/92  Pulse:  94 94 87  Resp:   18 16  Temp:   98.4 F (36.9 C) 97.9 F (36.6 C)  TempSrc:   Oral Oral  SpO2: 95%  95% 93%  Weight:      Height:        General: Pt is alert, awake, not in acute distress Cardiovascular: RRR, S1/S2 +, no rubs, no gallops Respiratory: CTA bilaterally, no wheezing, no rhonchi Abdominal: Soft, NT, ND, bowel sounds + Extremities: trace edema, no cyanosis    The results of significant diagnostics from this hospitalization (including imaging, microbiology, ancillary and laboratory) are listed below for reference.     Microbiology: Recent Results (from the past 240 hour(s))  Resp Panel by RT-PCR (Flu A&B, Covid) Nasopharyngeal Swab     Status: None   Collection Time: 02/24/21  6:18 PM   Specimen: Nasopharyngeal Swab; Nasopharyngeal(NP) swabs in vial transport medium  Result Value Ref Range Status   SARS Coronavirus 2 by RT PCR NEGATIVE NEGATIVE Final    Comment: (NOTE) SARS-CoV-2 target nucleic acids are NOT DETECTED.  The SARS-CoV-2 RNA is generally detectable in upper respiratory specimens during the acute phase of infection. The lowest concentration of SARS-CoV-2 viral copies this assay can detect is 138 copies/mL. A negative result does not preclude SARS-Cov-2 infection and should not be used as the sole basis for treatment or other patient management decisions. A  negative result may occur with  improper specimen collection/handling, submission of specimen other than nasopharyngeal swab, presence of viral mutation(s) within the areas targeted by this assay, and inadequate number of viral copies(<138 copies/mL). A negative result must be combined with clinical observations, patient history, and epidemiological information. The expected result is Negative.  Fact Sheet for Patients:  EntrepreneurPulse.com.au  Fact Sheet for Healthcare Providers:  IncredibleEmployment.be  This test is no t yet approved or cleared by the [place] and  has been authorized for  detection and/or diagnosis of SARS-CoV-2 by FDA under an Emergency Use Authorization (EUA). This EUA will remain  in effect (meaning this test can be used) for the duration of the COVID-19 declaration under Section 564(b)(1) of the Act, 21 U.S.C.section 360bbb-3(b)(1), unless the authorization is terminated  or revoked sooner.       Influenza A by PCR NEGATIVE NEGATIVE Final   Influenza B by PCR NEGATIVE NEGATIVE Final    Comment: (NOTE) The Xpert Xpress SARS-CoV-2/FLU/RSV plus assay is intended as an aid in the diagnosis of influenza from Nasopharyngeal swab specimens and should not be used as a sole basis for treatment. Nasal washings and aspirates are unacceptable for Xpert Xpress SARS-CoV-2/FLU/RSV testing.  Fact Sheet for Patients: EntrepreneurPulse.com.au  Fact Sheet for Healthcare Providers: IncredibleEmployment.be  This test is not yet approved or cleared by the [place] FDA and has been authorized for detection and/or diagnosis of SARS-CoV-2 by FDA under an Emergency Use Authorization (EUA). This EUA will remain in effect (meaning this test can be used) for the duration of the COVID-19 declaration under Section 564(b)(1) of the Act, 21 U.S.C. section 360bbb-3(b)(1), unless the authorization  is terminated or revoked.  Performed at [facility], [address] [place], [place] [address]   Blood Culture (routine x 2)     Status: None   Collection Time: 02/24/21  6:18 PM   Specimen: Right Antecubital; Blood  Result Value Ref Range Status   Specimen Description   Final    RIGHT ANTECUBITAL BOTTLES DRAWN AEROBIC AND ANAEROBIC   Special Requests Blood Culture adequate volume  Final   Culture   Final    NO GROWTH 6 DAYS Performed at [facility], [address] [place], [place] [address]    Report Status 03/02/2021 FINAL  Final  Blood Culture (routine x 2)     Status: None   Collection Time: 02/24/21  6:23 PM   Specimen: BLOOD RIGHT WRIST  Result Value Ref Range Status   Specimen Description   Final    BLOOD RIGHT WRIST BOTTLES DRAWN AEROBIC AND ANAEROBIC   Special Requests Blood Culture adequate volume  Final   Culture   Final    NO GROWTH 6 DAYS Performed at [facility], [address] [place], [place] [address]    Report Status 03/02/2021 FINAL  Final  Urine culture     Status: None   Collection Time: 02/25/21  6:18 AM   Specimen: In/Out Cath Urine  Result Value Ref Range Status   Specimen Description   Final    IN/OUT CATH URINE Performed at [facility], [address], [place], [place] [address]    Special Requests   Final    NONE Performed at [facility], [address], [place], [place] [address]    Culture   Final    NO GROWTH Performed at [facility], [place] [address], [place], [place] [address]    Report Status 02/27/2021 FINAL  Final     Labs: BNP (last 3 results) No results for input(s): BNP in the last 8760 hours. Basic Metabolic Panel: Recent Labs  Lab 02/25/21 0349 02/26/21 0705 02/27/21 0427 02/28/21 0650 03/01/21 0903 03/02/21 0438 03/03/21 0525  NA 136   < > 134* 134* 136 140 137  K 3.4*   < > 3.8 3.9 3.3* 3.2* 3.6  CL 107   < > 106 107 106 108 106  CO2 21*   < > 16* 19* 20* 21* 22  GLUCOSE  152*   < > 289* 233* 353* 164*  199*  BUN 17   < > 23 25* 27* 28* 29*  CREATININE 1.69*   < > 1.60* 1.46* 1.51* 1.34* 1.33*  CALCIUM 6.8*   < > 7.7* 7.2* 7.6* 8.0* 7.7*  MG 1.7  --   --   --   --   --   --   PHOS 3.4  --  2.5  --  3.7 3.1  --    < > = values in this interval not displayed.   Liver Function Tests: Recent Labs  Lab 02/25/21 0349 02/26/21 0705 02/27/21 0427 03/01/21 0903 03/02/21 0438  AST 14* 25  --   --   --   ALT 12 17  --   --   --   ALKPHOS 107 149*  --   --   --   BILITOT 0.6 0.4  --   --   --   PROT 5.0* 5.7*  --   --   --   ALBUMIN 2.1* 2.2* 2.6* 2.3* 2.4*   No results for input(s): LIPASE, AMYLASE in the last 168 hours. No results for input(s): AMMONIA in the last 168 hours. CBC: Recent Labs  Lab 02/25/21 0349 02/26/21 0705 02/28/21 0650 03/01/21 0903 03/02/21 0438  WBC 20.5* 11.2* 11.1* 11.2* 16.3*  HGB 10.0* 10.3* 9.8* 9.4* 9.8*  HCT 31.0* 32.3* 30.3* 28.3* 29.6*  MCV 97.8 97.3 95.3 94.6 93.7  PLT 188 234 287 322 351   Cardiac Enzymes: No results for input(s): CKTOTAL, CKMB, CKMBINDEX, TROPONINI in the last 168 hours. BNP: Invalid input(s): POCBNP CBG: Recent Labs  Lab 03/02/21 1115 03/02/21 1601 03/02/21 2105 03/03/21 0708 03/03/21 1109  GLUCAP 220* 113* 83 232* 344*   D-Dimer No results for input(s): DDIMER in the last 72 hours. Hgb A1c No results for input(s): HGBA1C in the last 72 hours. Lipid Profile No results for input(s): CHOL, HDL, LDLCALC, TRIG, CHOLHDL, LDLDIRECT in the last 72 hours. Thyroid function studies Recent Labs    03/02/21 0438  TSH 0.185*   Anemia work up No results for input(s): VITAMINB12, FOLATE, FERRITIN, TIBC, IRON, RETICCTPCT in the last 72 hours. Urinalysis    Component Value Date/Time   COLORURINE YELLOW 02/25/2021 0618   APPEARANCEUR CLEAR 02/25/2021 0618   LABSPEC 1.013 02/25/2021 0618   PHURINE 6.0 02/25/2021 0618   GLUCOSEU 50 (A) 02/25/2021 0618   HGBUR SMALL (A) 02/25/2021 0618   BILIRUBINUR NEGATIVE 02/25/2021  0618   KETONESUR 5 (A) 02/25/2021 0618   PROTEINUR >=300 (A) 02/25/2021 0618   UROBILINOGEN 0.2 09/01/2014 1442   NITRITE NEGATIVE 02/25/2021 0618   LEUKOCYTESUR NEGATIVE 02/25/2021 0618   Sepsis Labs Invalid input(s): PROCALCITONIN,  WBC,  LACTICIDVEN Microbiology Recent Results (from the past 240 hour(s))  Resp Panel by RT-PCR (Flu A&B, Covid) Nasopharyngeal Swab     Status: None   Collection Time: 02/24/21  6:18 PM   Specimen: Nasopharyngeal Swab; Nasopharyngeal(NP) swabs in vial transport medium  Result Value Ref Range Status   SARS Coronavirus 2 by RT PCR NEGATIVE NEGATIVE Final    Comment: (NOTE) SARS-CoV-2 target nucleic acids are NOT DETECTED.  The SARS-CoV-2 RNA is generally detectable in upper respiratory specimens during the acute phase of infection. The lowest concentration of SARS-CoV-2 viral copies this assay can detect is 138 copies/mL. A negative result does not preclude SARS-Cov-2 infection and should not be used as the sole basis for treatment or other patient management decisions. A negative  result may occur with  improper specimen collection/handling, submission of specimen other than nasopharyngeal swab, presence of viral mutation(s) within the areas targeted by this assay, and inadequate number of viral copies(<138 copies/mL). A negative result must be combined with clinical observations, patient history, and epidemiological information. The expected result is Negative.  Fact Sheet for Patients:  EntrepreneurPulse.com.au  Fact Sheet for Healthcare Providers:  IncredibleEmployment.be  This test is no t yet approved or cleared by the [place] FDA and  has been authorized for detection and/or diagnosis of SARS-CoV-2 by FDA under an Emergency Use Authorization (EUA). This EUA will remain  in effect (meaning this test can be used) for the duration of the COVID-19 declaration under Section 564(b)(1) of the Act,  21 U.S.C.section 360bbb-3(b)(1), unless the authorization is terminated  or revoked sooner.       Influenza A by PCR NEGATIVE NEGATIVE Final   Influenza B by PCR NEGATIVE NEGATIVE Final    Comment: (NOTE) The Xpert Xpress SARS-CoV-2/FLU/RSV plus assay is intended as an aid in the diagnosis of influenza from Nasopharyngeal swab specimens and should not be used as a sole basis for treatment. Nasal washings and aspirates are unacceptable for Xpert Xpress SARS-CoV-2/FLU/RSV testing.  Fact Sheet for Patients: EntrepreneurPulse.com.au  Fact Sheet for Healthcare Providers: IncredibleEmployment.be  This test is not yet approved or cleared by the [place] FDA and has been authorized for detection and/or diagnosis of SARS-CoV-2 by FDA under an Emergency Use Authorization (EUA). This EUA will remain in effect (meaning this test can be used) for the duration of the COVID-19 declaration under Section 564(b)(1) of the Act, 21 U.S.C. section 360bbb-3(b)(1), unless the authorization is terminated or revoked.  Performed at [facility], [address], [place], [place] [address]   Blood Culture (routine x 2)     Status: None   Collection Time: 02/24/21  6:18 PM   Specimen: Right Antecubital; Blood  Result Value Ref Range Status   Specimen Description   Final    RIGHT ANTECUBITAL BOTTLES DRAWN AEROBIC AND ANAEROBIC   Special Requests Blood Culture adequate volume  Final   Culture   Final    NO GROWTH 6 DAYS Performed at [facility], [address], [place], [place] [address]    Report Status 03/02/2021 FINAL  Final  Blood Culture (routine x 2)     Status: None   Collection Time: 02/24/21  6:23 PM   Specimen: BLOOD RIGHT WRIST  Result Value Ref Range Status   Specimen Description   Final    BLOOD RIGHT WRIST BOTTLES DRAWN AEROBIC AND ANAEROBIC   Special Requests Blood Culture adequate volume  Final   Culture   Final    NO GROWTH 6 DAYS Performed  at [facility], [address], [place], [place] [address]    Report Status 03/02/2021 FINAL  Final  Urine culture     Status: None   Collection Time: 02/25/21  6:18 AM   Specimen: In/Out Cath Urine  Result Value Ref Range Status   Specimen Description   Final    IN/OUT CATH URINE Performed at [facility], [address], [place], [place] [address]    Special Requests   Final    NONE Performed at [facility], [address], [place], [place] [address]    Culture   Final    NO GROWTH Performed at [facility], [place] [address], [place], [place] [address]    Report Status 02/27/2021 FINAL  Final  Time coordinating discharge: 38mins  SIGNED:   Kathie Dike, MD  [facility] 03/03/2021, 8:56 PM   If 7PM-7AM, please contact night-coverage [email or web address]

## 2021-03-14 DIAGNOSIS — E1129 Type 2 diabetes mellitus with other diabetic kidney complication: Secondary | ICD-10-CM | POA: Diagnosis not present

## 2021-03-14 DIAGNOSIS — Z6827 Body mass index (BMI) 27.0-27.9, adult: Secondary | ICD-10-CM | POA: Diagnosis not present

## 2021-03-14 DIAGNOSIS — J9601 Acute respiratory failure with hypoxia: Secondary | ICD-10-CM | POA: Diagnosis not present

## 2021-03-14 DIAGNOSIS — J189 Pneumonia, unspecified organism: Secondary | ICD-10-CM | POA: Diagnosis not present

## 2021-03-14 DIAGNOSIS — E663 Overweight: Secondary | ICD-10-CM | POA: Diagnosis not present

## 2021-03-14 DIAGNOSIS — J449 Chronic obstructive pulmonary disease, unspecified: Secondary | ICD-10-CM | POA: Diagnosis not present

## 2021-03-14 DIAGNOSIS — J441 Chronic obstructive pulmonary disease with (acute) exacerbation: Secondary | ICD-10-CM | POA: Diagnosis not present

## 2021-03-18 DIAGNOSIS — E870 Hyperosmolality and hypernatremia: Secondary | ICD-10-CM | POA: Diagnosis not present

## 2021-03-18 DIAGNOSIS — R809 Proteinuria, unspecified: Secondary | ICD-10-CM | POA: Diagnosis not present

## 2021-03-18 DIAGNOSIS — E872 Acidosis: Secondary | ICD-10-CM | POA: Diagnosis not present

## 2021-03-18 DIAGNOSIS — E1129 Type 2 diabetes mellitus with other diabetic kidney complication: Secondary | ICD-10-CM | POA: Diagnosis not present

## 2021-03-18 DIAGNOSIS — N049 Nephrotic syndrome with unspecified morphologic changes: Secondary | ICD-10-CM | POA: Diagnosis not present

## 2021-03-18 DIAGNOSIS — E1122 Type 2 diabetes mellitus with diabetic chronic kidney disease: Secondary | ICD-10-CM | POA: Diagnosis not present

## 2021-03-18 DIAGNOSIS — N189 Chronic kidney disease, unspecified: Secondary | ICD-10-CM | POA: Diagnosis not present

## 2021-03-18 DIAGNOSIS — I129 Hypertensive chronic kidney disease with stage 1 through stage 4 chronic kidney disease, or unspecified chronic kidney disease: Secondary | ICD-10-CM | POA: Diagnosis not present

## 2021-03-22 DIAGNOSIS — G894 Chronic pain syndrome: Secondary | ICD-10-CM | POA: Diagnosis not present

## 2021-03-22 DIAGNOSIS — E1129 Type 2 diabetes mellitus with other diabetic kidney complication: Secondary | ICD-10-CM | POA: Diagnosis not present

## 2021-03-22 DIAGNOSIS — M810 Age-related osteoporosis without current pathological fracture: Secondary | ICD-10-CM | POA: Diagnosis not present

## 2021-03-22 DIAGNOSIS — I10 Essential (primary) hypertension: Secondary | ICD-10-CM | POA: Diagnosis not present

## 2021-03-22 DIAGNOSIS — N189 Chronic kidney disease, unspecified: Secondary | ICD-10-CM | POA: Diagnosis not present

## 2021-03-22 DIAGNOSIS — Z7984 Long term (current) use of oral hypoglycemic drugs: Secondary | ICD-10-CM | POA: Diagnosis not present

## 2021-03-22 DIAGNOSIS — M1990 Unspecified osteoarthritis, unspecified site: Secondary | ICD-10-CM | POA: Diagnosis not present

## 2021-03-22 DIAGNOSIS — M6281 Muscle weakness (generalized): Secondary | ICD-10-CM | POA: Diagnosis not present

## 2021-03-22 DIAGNOSIS — J449 Chronic obstructive pulmonary disease, unspecified: Secondary | ICD-10-CM | POA: Diagnosis not present

## 2021-03-25 DIAGNOSIS — E1129 Type 2 diabetes mellitus with other diabetic kidney complication: Secondary | ICD-10-CM | POA: Diagnosis not present

## 2021-03-25 DIAGNOSIS — N049 Nephrotic syndrome with unspecified morphologic changes: Secondary | ICD-10-CM | POA: Diagnosis not present

## 2021-03-25 DIAGNOSIS — E1122 Type 2 diabetes mellitus with diabetic chronic kidney disease: Secondary | ICD-10-CM | POA: Diagnosis not present

## 2021-03-25 DIAGNOSIS — N189 Chronic kidney disease, unspecified: Secondary | ICD-10-CM | POA: Diagnosis not present

## 2021-03-25 DIAGNOSIS — R809 Proteinuria, unspecified: Secondary | ICD-10-CM | POA: Diagnosis not present

## 2021-03-25 DIAGNOSIS — I129 Hypertensive chronic kidney disease with stage 1 through stage 4 chronic kidney disease, or unspecified chronic kidney disease: Secondary | ICD-10-CM | POA: Diagnosis not present

## 2021-03-25 DIAGNOSIS — R600 Localized edema: Secondary | ICD-10-CM | POA: Diagnosis not present

## 2021-03-28 DIAGNOSIS — M6281 Muscle weakness (generalized): Secondary | ICD-10-CM | POA: Diagnosis not present

## 2021-03-28 DIAGNOSIS — M1990 Unspecified osteoarthritis, unspecified site: Secondary | ICD-10-CM | POA: Diagnosis not present

## 2021-03-28 DIAGNOSIS — I10 Essential (primary) hypertension: Secondary | ICD-10-CM | POA: Diagnosis not present

## 2021-03-28 DIAGNOSIS — Z7984 Long term (current) use of oral hypoglycemic drugs: Secondary | ICD-10-CM | POA: Diagnosis not present

## 2021-03-28 DIAGNOSIS — J449 Chronic obstructive pulmonary disease, unspecified: Secondary | ICD-10-CM | POA: Diagnosis not present

## 2021-03-28 DIAGNOSIS — N189 Chronic kidney disease, unspecified: Secondary | ICD-10-CM | POA: Diagnosis not present

## 2021-03-28 DIAGNOSIS — E1129 Type 2 diabetes mellitus with other diabetic kidney complication: Secondary | ICD-10-CM | POA: Diagnosis not present

## 2021-03-28 DIAGNOSIS — M810 Age-related osteoporosis without current pathological fracture: Secondary | ICD-10-CM | POA: Diagnosis not present

## 2021-03-28 DIAGNOSIS — G894 Chronic pain syndrome: Secondary | ICD-10-CM | POA: Diagnosis not present

## 2021-04-04 DIAGNOSIS — N189 Chronic kidney disease, unspecified: Secondary | ICD-10-CM | POA: Diagnosis not present

## 2021-04-04 DIAGNOSIS — G894 Chronic pain syndrome: Secondary | ICD-10-CM | POA: Diagnosis not present

## 2021-04-04 DIAGNOSIS — E1129 Type 2 diabetes mellitus with other diabetic kidney complication: Secondary | ICD-10-CM | POA: Diagnosis not present

## 2021-04-04 DIAGNOSIS — J449 Chronic obstructive pulmonary disease, unspecified: Secondary | ICD-10-CM | POA: Diagnosis not present

## 2021-04-07 DIAGNOSIS — N049 Nephrotic syndrome with unspecified morphologic changes: Secondary | ICD-10-CM | POA: Diagnosis not present

## 2021-04-07 DIAGNOSIS — R809 Proteinuria, unspecified: Secondary | ICD-10-CM | POA: Diagnosis not present

## 2021-04-07 DIAGNOSIS — E1129 Type 2 diabetes mellitus with other diabetic kidney complication: Secondary | ICD-10-CM | POA: Diagnosis not present

## 2021-04-07 DIAGNOSIS — E1122 Type 2 diabetes mellitus with diabetic chronic kidney disease: Secondary | ICD-10-CM | POA: Diagnosis not present

## 2021-04-07 DIAGNOSIS — N189 Chronic kidney disease, unspecified: Secondary | ICD-10-CM | POA: Diagnosis not present

## 2021-04-07 DIAGNOSIS — R600 Localized edema: Secondary | ICD-10-CM | POA: Diagnosis not present

## 2021-04-07 DIAGNOSIS — I129 Hypertensive chronic kidney disease with stage 1 through stage 4 chronic kidney disease, or unspecified chronic kidney disease: Secondary | ICD-10-CM | POA: Diagnosis not present

## 2021-04-13 DIAGNOSIS — D638 Anemia in other chronic diseases classified elsewhere: Secondary | ICD-10-CM | POA: Diagnosis not present

## 2021-04-13 DIAGNOSIS — R809 Proteinuria, unspecified: Secondary | ICD-10-CM | POA: Diagnosis not present

## 2021-04-13 DIAGNOSIS — I129 Hypertensive chronic kidney disease with stage 1 through stage 4 chronic kidney disease, or unspecified chronic kidney disease: Secondary | ICD-10-CM | POA: Diagnosis not present

## 2021-04-13 DIAGNOSIS — N189 Chronic kidney disease, unspecified: Secondary | ICD-10-CM | POA: Diagnosis not present

## 2021-04-13 DIAGNOSIS — E870 Hyperosmolality and hypernatremia: Secondary | ICD-10-CM | POA: Diagnosis not present

## 2021-04-13 DIAGNOSIS — E1122 Type 2 diabetes mellitus with diabetic chronic kidney disease: Secondary | ICD-10-CM | POA: Diagnosis not present

## 2021-04-13 DIAGNOSIS — E1129 Type 2 diabetes mellitus with other diabetic kidney complication: Secondary | ICD-10-CM | POA: Diagnosis not present

## 2021-04-25 DIAGNOSIS — E1140 Type 2 diabetes mellitus with diabetic neuropathy, unspecified: Secondary | ICD-10-CM | POA: Diagnosis not present

## 2021-04-25 DIAGNOSIS — J449 Chronic obstructive pulmonary disease, unspecified: Secondary | ICD-10-CM | POA: Diagnosis not present

## 2021-04-25 DIAGNOSIS — Z6827 Body mass index (BMI) 27.0-27.9, adult: Secondary | ICD-10-CM | POA: Diagnosis not present

## 2021-04-25 DIAGNOSIS — Z0001 Encounter for general adult medical examination with abnormal findings: Secondary | ICD-10-CM | POA: Diagnosis not present

## 2021-04-25 DIAGNOSIS — Z1331 Encounter for screening for depression: Secondary | ICD-10-CM | POA: Diagnosis not present

## 2021-04-25 DIAGNOSIS — I10 Essential (primary) hypertension: Secondary | ICD-10-CM | POA: Diagnosis not present

## 2021-04-25 DIAGNOSIS — E663 Overweight: Secondary | ICD-10-CM | POA: Diagnosis not present

## 2021-04-25 DIAGNOSIS — M1991 Primary osteoarthritis, unspecified site: Secondary | ICD-10-CM | POA: Diagnosis not present

## 2021-04-30 DIAGNOSIS — Z1211 Encounter for screening for malignant neoplasm of colon: Secondary | ICD-10-CM | POA: Diagnosis not present

## 2021-05-17 DIAGNOSIS — Z23 Encounter for immunization: Secondary | ICD-10-CM | POA: Diagnosis not present

## 2021-05-17 DIAGNOSIS — E538 Deficiency of other specified B group vitamins: Secondary | ICD-10-CM | POA: Diagnosis not present

## 2021-06-07 DIAGNOSIS — Z7951 Long term (current) use of inhaled steroids: Secondary | ICD-10-CM | POA: Diagnosis not present

## 2021-06-07 DIAGNOSIS — J209 Acute bronchitis, unspecified: Secondary | ICD-10-CM | POA: Diagnosis not present

## 2021-06-15 DIAGNOSIS — D638 Anemia in other chronic diseases classified elsewhere: Secondary | ICD-10-CM | POA: Diagnosis not present

## 2021-06-15 DIAGNOSIS — R809 Proteinuria, unspecified: Secondary | ICD-10-CM | POA: Diagnosis not present

## 2021-06-15 DIAGNOSIS — E1129 Type 2 diabetes mellitus with other diabetic kidney complication: Secondary | ICD-10-CM | POA: Diagnosis not present

## 2021-06-15 DIAGNOSIS — I129 Hypertensive chronic kidney disease with stage 1 through stage 4 chronic kidney disease, or unspecified chronic kidney disease: Secondary | ICD-10-CM | POA: Diagnosis not present

## 2021-06-15 DIAGNOSIS — N189 Chronic kidney disease, unspecified: Secondary | ICD-10-CM | POA: Diagnosis not present

## 2021-06-15 DIAGNOSIS — E1122 Type 2 diabetes mellitus with diabetic chronic kidney disease: Secondary | ICD-10-CM | POA: Diagnosis not present

## 2021-06-17 ENCOUNTER — Encounter: Payer: Self-pay | Admitting: Emergency Medicine

## 2021-06-17 ENCOUNTER — Ambulatory Visit
Admission: EM | Admit: 2021-06-17 | Discharge: 2021-06-17 | Disposition: A | Discharge status: Home / Self Care | Payer: PPO | Attending: Family Medicine | Admitting: Family Medicine

## 2021-06-17 ENCOUNTER — Other Ambulatory Visit: Payer: Self-pay

## 2021-06-17 ENCOUNTER — Ambulatory Visit (INDEPENDENT_AMBULATORY_CARE_PROVIDER_SITE_OTHER): Payer: PPO

## 2021-06-17 DIAGNOSIS — J441 Chronic obstructive pulmonary disease with (acute) exacerbation: Secondary | ICD-10-CM | POA: Diagnosis not present

## 2021-06-17 DIAGNOSIS — R0602 Shortness of breath: Secondary | ICD-10-CM

## 2021-06-17 DIAGNOSIS — R059 Cough, unspecified: Secondary | ICD-10-CM

## 2021-06-17 DIAGNOSIS — R062 Wheezing: Secondary | ICD-10-CM

## 2021-06-17 MED ORDER — DEXAMETHASONE SODIUM PHOSPHATE 10 MG/ML IJ SOLN
10.0000 mg | Freq: Once | INTRAMUSCULAR | Status: AC
  Administered 2021-06-17: 10 mg via INTRAMUSCULAR

## 2021-06-17 MED ORDER — DOXYCYCLINE HYCLATE 100 MG PO CAPS: 100.0000 mg | ORAL_CAPSULE | Freq: Two times a day (BID) | ORAL | 0 refills | Status: DC

## 2021-06-17 MED ORDER — ALBUTEROL SULFATE HFA 108 (90 BASE) MCG/ACT IN AERS
4.0000 | INHALATION_SPRAY | Freq: Once | RESPIRATORY_TRACT | Status: AC
  Administered 2021-06-17: 4 via RESPIRATORY_TRACT

## 2021-06-17 MED ORDER — PREDNISONE 20 MG PO TABS: 40.0000 mg | ORAL_TABLET | Freq: Every day | ORAL | 0 refills | Status: DC

## 2021-06-17 NOTE — ED Provider Notes (Addendum)
[facility]    CSN: [id number] Arrival date & time: 06/17/21  1639      History   Chief Complaint No chief complaint on file.   HPI Lindsay Cortez is a 80 y.o. female.   Patient presenting today with over a week of congestion, cough, ear pressure, fatigue.  States that the cough has significantly worsened over the past 24 hours, now productive of green sputum and having significant shortness of breath, wheezing.  She was prescribed a prednisone and azithromycin course by her primary care provider last week, has completed both and felt only slightly better with these.  Denies known fever, chills, body aches, abdominal pain, nausea vomiting or diarrhea.  Has a complicated medical history significant for asthma, severe COPD, diabetes, history of pulmonary emboli and a recent hospitalization earlier this year for pneumonia with sepsis.  Takes Symbicort twice daily insistently for her COPD, has a rescue inhaler but has not used it since last night.   Past Medical History:  Diagnosis Date   Arthritis    Asthma    Back pain    COPD (chronic obstructive pulmonary disease) ([place])    Diabetes ([place]) 09/01/2014   Diabetes mellitus    x 15 yrs   Gout    Hypertension    Kidney stones    Pancreatic cyst    Pneumonia    Renal disorder    Renal insufficiency    Shoulder dislocation    Vertigo     Patient Active Problem List   Diagnosis Date Noted   CAP (community acquired pneumonia) 02/24/2021   Sepsis due to community-acquired pneumonia 02/24/2021   Hypoalbuminemia 02/24/2021   Hypothyroidism 02/24/2021   Hyperglycemia due to diabetes mellitus ([place]) 02/24/2021   Essential hypertension 02/24/2021   Leukocytosis 07/11/2019   Acute respiratory failure with hypoxia ([place]) 09/02/2014   Chronic kidney disease 09/02/2014   Nausea and vomiting 09/01/2014   COPD (chronic obstructive pulmonary disease) ([place]) 09/01/2014   Dehydration 09/01/2014   Hypoxia 09/01/2014   Diabetes  mellitus ([place]) 09/01/2014   Acute exacerbation of COPD with asthma ([place]) 09/01/2014   Pancreatic mass 05/19/2014   Common bile duct calculi 05/19/2014   Right knee pain 09/18/2013   Lateral meniscus derangement 09/18/2013   Arthritis of right knee 09/18/2013    Past Surgical History:  Procedure Laterality Date   ABDOMINAL HYSTERECTOMY     BLADDER SURGERY     bladder tac   BREAST SURGERY     benign   FOOT SURGERY      OB History     Gravida  2   Para  2   Term  2   Preterm      AB      Living  2      SAB      IAB      Ectopic      Multiple      Live Births               Home Medications    Prior to Admission medications   Medication Sig Start Date End Date Taking? Authorizing Provider  doxycycline (VIBRAMYCIN) 100 MG capsule Take 1 capsule (100 mg total) by mouth 2 (two) times daily. 06/17/21  Yes Volney American, PA-C  predniSONE (DELTASONE) 20 MG tablet Take 2 tablets (40 mg total) by mouth daily with breakfast. 06/17/21  Yes Volney American, PA-C  acetaminophen (TYLENOL) 500 MG tablet Take 1,000 mg by mouth daily as  needed (pain). For pain    [provider]  albuterol (PROVENTIL) (2.5 MG/3ML) 0.083% nebulizer solution Take 3 mLs (2.5 mg total) by nebulization every 4 (four) hours as needed for wheezing. 09/05/14   Samuella Cota, MD  albuterol (VENTOLIN HFA) 108 (90 BASE) MCG/ACT inhaler Inhale 2 puffs into the lungs every 4 (four) hours as needed for wheezing or shortness of breath. For rescue 09/05/14   Samuella Cota, MD  alendronate (FOSAMAX) 70 MG tablet Take 70 mg by mouth once a week. 04/08/20   [provider]  allopurinol (ZYLOPRIM) 100 MG tablet Take 200 mg by mouth daily.    [provider]  amLODipine (NORVASC) 5 MG tablet Take 5 mg by mouth daily.    [provider]  budesonide-formoterol (SYMBICORT) 80-4.5 MCG/ACT inhaler Inhale 2 puffs into the lungs 2 (two) times daily.    [provider]  Calcium Carbonate-Vitamin D (CALCIUM 600 + D PO) Take 1 tablet by mouth daily.    [provider]  Cholecalciferol (VITAMIN D) 2000 UNITS CAPS Take 1 capsule by mouth every evening.    [provider]  cyclobenzaprine (FLEXERIL) 5 MG tablet Take 1 tablet (5 mg total) by mouth at bedtime. 02/11/21   Mordecai Rasmussen, MD  dextromethorphan-guaiFENesin [facility] DM) 30-600 MG 12hr tablet Take 1 tablet by mouth 2 (two) times daily. 03/03/21   Kathie Dike, MD  glimepiride (AMARYL) 2 MG tablet Take 2 mg by mouth daily with breakfast. 2 in the morning and 1 at bedtime    [provider]  levothyroxine (SYNTHROID) 75 MCG tablet Take 75 mcg by mouth daily. 10/07/19   [provider]  loperamide (IMODIUM) 2 MG capsule Take 2 mg by mouth every morning.    [provider]  meclizine (ANTIVERT) 25 MG tablet Take 1 tablet (25 mg total) by mouth 3 (three) times daily as needed for dizziness. 11/12/15   Forde Dandy, MD  metoprolol succinate (TOPROL-XL) 50 MG 24 hr tablet Take 50 mg by mouth daily. 03/10/19   [provider]  predniSONE (DELTASONE) 10 MG tablet Take 40mg  po daily for 2 days then 30mg  daily for 2 days then 20mg  daily for 2 days then 10mg  daily for 2 days then stop 03/03/21   Kathie Dike, MD  promethazine (PHENERGAN) 25 MG suppository Place 1 suppository (25 mg total) rectally every 6 (six) hours as needed for nausea or vomiting. 11/12/15   Forde Dandy, MD  simvastatin (ZOCOR) 10 MG tablet Take 10 mg by mouth at bedtime.    [provider]  sodium bicarbonate 650 MG tablet Take by mouth 3 (three) times daily. 02/04/21   [provider]    Family History Family History  Problem Relation Age of Onset   Breast cancer Mother    Emphysema Father    COPD Sister    Atrial fibrillation Sister    Obesity Brother    Cirrhosis Daughter    COPD Son    Diabetes Brother    Asthma Brother     Social History Social  History   Tobacco Use   Smoking status: Never   Smokeless tobacco: Never  Substance Use Topics   Alcohol use: No    Alcohol/week: 0.0 standard drinks   Drug use: No     Allergies   Fentanyl, Codeine, and Levaquin [levofloxacin in d5w]   Review of Systems Review of Systems Per HPI  Physical Exam Triage Vital Signs ED Triage  Vitals  Enc Vitals Group     BP 06/17/21 1742 120/62     Pulse Rate 06/17/21 1742 76     Resp 06/17/21 1742 18     Temp 06/17/21 1742 98.1 F (36.7 C)     Temp Source 06/17/21 1742 Oral     SpO2 06/17/21 1742 (!) 88 %     Weight --      Height --      Head Circumference --      Peak Flow --      Pain Score 06/17/21 1744 0     Pain Loc --      Pain Edu? --      Excl. in [place]? --    No data found.  Updated Vital Signs BP 120/62 (BP Location: Right Arm)   Pulse 76   Temp 98.1 F (36.7 C) (Oral)   Resp 18   SpO2 90%   Visual Acuity Right Eye Distance:   Left Eye Distance:   Bilateral Distance:    Right Eye Near:   Left Eye Near:    Bilateral Near:     Physical Exam Vitals and nursing note reviewed.  Constitutional:      Appearance: Normal appearance. She is not ill-appearing.  HENT:     Head: Atraumatic.     Right Ear: Tympanic membrane normal.     Left Ear: Tympanic membrane normal.     Nose: Nose normal.     Mouth/Throat:     Mouth: Mucous membranes are moist.     Pharynx: Posterior oropharyngeal erythema present.  Eyes:     Extraocular Movements: Extraocular movements intact.     Conjunctiva/sclera: Conjunctivae normal.  Cardiovascular:     Rate and Rhythm: Normal rate and regular rhythm.     Heart sounds: Normal heart sounds.  Pulmonary:     Effort: Pulmonary effort is normal. No respiratory distress.     Breath sounds: Wheezing present. No rales.     Comments: Moderate wheezes diffusely, decreased breath sounds throughout.  No rales or rhonchi.  Speaking in full sentences on room air. Musculoskeletal:     Cervical  back: Normal range of motion and neck supple.     Comments: Ambulating well with walker, at baseline  Skin:    General: Skin is warm and dry.  Neurological:     Mental Status: She is alert and oriented to person, place, and time.  Psychiatric:        Mood and Affect: Mood normal.        Thought Content: Thought content normal.        Judgment: Judgment normal.     UC Treatments / Results  Labs (all labs ordered are listed, but only abnormal results are displayed) Labs Reviewed - No data to display  EKG   Radiology DG Chest 2 View  Result Date: 06/17/2021 CLINICAL DATA:  Cough for 1 week.  History of pneumonia. EXAM: CHEST - 2 VIEW COMPARISON:  Radiographs 03/02/2021, 02/28/2021 and 02/24/2021. FINDINGS: The heart size and mediastinal contours are stable with aortic atherosclerosis. There is interval improved aeration of the lung bases. Chronic central airway thickening and probable basilar scarring are unchanged. There is no superimposed edema, confluent airspace opacity, pleural effusion or pneumothorax. There are degenerative changes throughout the thoracic spine with probable posttraumatic ossification surrounding the left acromioclavicular joint. There is a thoracolumbar scoliosis. IMPRESSION: Stable chronic lung disease. No evidence of pneumonia or other active cardiopulmonary process. Electronically Signed  By: Richardean Sale M.D.   On: 06/17/2021 18:22    Procedures Procedures (including critical care time)  Medications Ordered in UC Medications  albuterol (VENTOLIN HFA) 108 (90 Base) MCG/ACT inhaler 4 puff (4 puffs Inhalation Given 06/17/21 1808)  dexamethasone (DECADRON) injection 10 mg (10 mg Intramuscular Given 06/17/21 1827)    Initial Impression / Assessment and Plan / UC Course  I have reviewed the triage vital signs and the nursing notes.  Pertinent labs & imaging results that were available during my care of the patient were reviewed by me and considered in my  medical decision making (see chart for details).     Patient with borderline O2 saturation on room air oscillating between 89% to 92%.  Otherwise vital signs benign and within normal limits and she appears in no acute distress.  She was given albuterol inhaler x4 puffs as well as IM Decadron in clinic which improved her oxygen saturation to a consistent 91% on room air.  She has support at home and has a pulse oximeter available and wishes to be discharged home for close monitoring on prednisone, antibiotics, nebulizer treatments and Symbicort.  She is aware to go to the emergency department if worsening at any time or simply not improving.   60 minutes spent today in direct patient care, coordination and education.  Final Clinical Impressions(s) / UC Diagnoses   Final diagnoses:  COPD exacerbation ([place])  Wheezing  SOB (shortness of breath)     Discharge Instructions      Go to the emergency department if your symptoms worsen at any time.  Monitor your oxygen levels at home and try to keep somebody with you at all times in case you worsen.     ED Prescriptions     Medication Sig Dispense Auth. Provider   doxycycline (VIBRAMYCIN) 100 MG capsule Take 1 capsule (100 mg total) by mouth 2 (two) times daily. 20 capsule Volney American, [place]   predniSONE (DELTASONE) 20 MG tablet Take 2 tablets (40 mg total) by mouth daily with breakfast. 10 tablet Volney American, [place]      PDMP not reviewed this encounter.   Volney American, PA-C 06/17/21 1916    Volney American, [place] 06/17/21 [phone]

## 2021-06-17 NOTE — Discharge Instructions (Signed)
Go to the emergency department if your symptoms worsen at any time.  Monitor your oxygen levels at home and try to keep somebody with you at all times in case you worsen.

## 2021-06-17 NOTE — ED Triage Notes (Signed)
Nasal congestion, cough and ears feel stopped up.  Coughing up green sputum yesterday.

## 2021-06-22 DIAGNOSIS — N189 Chronic kidney disease, unspecified: Secondary | ICD-10-CM | POA: Diagnosis not present

## 2021-06-22 DIAGNOSIS — N200 Calculus of kidney: Secondary | ICD-10-CM | POA: Diagnosis not present

## 2021-06-22 DIAGNOSIS — R809 Proteinuria, unspecified: Secondary | ICD-10-CM | POA: Diagnosis not present

## 2021-06-22 DIAGNOSIS — I129 Hypertensive chronic kidney disease with stage 1 through stage 4 chronic kidney disease, or unspecified chronic kidney disease: Secondary | ICD-10-CM | POA: Diagnosis not present

## 2021-06-22 DIAGNOSIS — E1122 Type 2 diabetes mellitus with diabetic chronic kidney disease: Secondary | ICD-10-CM | POA: Diagnosis not present

## 2021-06-22 DIAGNOSIS — R600 Localized edema: Secondary | ICD-10-CM | POA: Diagnosis not present

## 2021-06-22 DIAGNOSIS — E1129 Type 2 diabetes mellitus with other diabetic kidney complication: Secondary | ICD-10-CM | POA: Diagnosis not present

## 2021-06-24 DIAGNOSIS — E538 Deficiency of other specified B group vitamins: Secondary | ICD-10-CM | POA: Diagnosis not present

## 2021-06-24 DIAGNOSIS — E1140 Type 2 diabetes mellitus with diabetic neuropathy, unspecified: Secondary | ICD-10-CM | POA: Diagnosis not present

## 2021-06-24 DIAGNOSIS — J449 Chronic obstructive pulmonary disease, unspecified: Secondary | ICD-10-CM | POA: Diagnosis not present

## 2021-06-24 DIAGNOSIS — Z6825 Body mass index (BMI) 25.0-25.9, adult: Secondary | ICD-10-CM | POA: Diagnosis not present

## 2021-06-24 DIAGNOSIS — I10 Essential (primary) hypertension: Secondary | ICD-10-CM | POA: Diagnosis not present

## 2021-06-30 DIAGNOSIS — J209 Acute bronchitis, unspecified: Secondary | ICD-10-CM | POA: Diagnosis not present

## 2021-06-30 DIAGNOSIS — Z7951 Long term (current) use of inhaled steroids: Secondary | ICD-10-CM | POA: Diagnosis not present

## 2021-08-05 DIAGNOSIS — E1122 Type 2 diabetes mellitus with diabetic chronic kidney disease: Secondary | ICD-10-CM | POA: Diagnosis not present

## 2021-08-05 DIAGNOSIS — N183 Chronic kidney disease, stage 3 unspecified: Secondary | ICD-10-CM | POA: Diagnosis not present

## 2021-08-05 DIAGNOSIS — I129 Hypertensive chronic kidney disease with stage 1 through stage 4 chronic kidney disease, or unspecified chronic kidney disease: Secondary | ICD-10-CM | POA: Diagnosis not present

## 2021-08-05 DIAGNOSIS — J449 Chronic obstructive pulmonary disease, unspecified: Secondary | ICD-10-CM | POA: Diagnosis not present

## 2021-08-05 DIAGNOSIS — I10 Essential (primary) hypertension: Secondary | ICD-10-CM | POA: Diagnosis not present

## 2021-08-05 DIAGNOSIS — D692 Other nonthrombocytopenic purpura: Secondary | ICD-10-CM | POA: Diagnosis not present

## 2021-08-23 DIAGNOSIS — E1129 Type 2 diabetes mellitus with other diabetic kidney complication: Secondary | ICD-10-CM | POA: Diagnosis not present

## 2021-08-23 DIAGNOSIS — R809 Proteinuria, unspecified: Secondary | ICD-10-CM | POA: Diagnosis not present

## 2021-08-23 DIAGNOSIS — N189 Chronic kidney disease, unspecified: Secondary | ICD-10-CM | POA: Diagnosis not present

## 2021-08-23 DIAGNOSIS — R600 Localized edema: Secondary | ICD-10-CM | POA: Diagnosis not present

## 2021-08-23 DIAGNOSIS — I129 Hypertensive chronic kidney disease with stage 1 through stage 4 chronic kidney disease, or unspecified chronic kidney disease: Secondary | ICD-10-CM | POA: Diagnosis not present

## 2021-08-23 DIAGNOSIS — E1122 Type 2 diabetes mellitus with diabetic chronic kidney disease: Secondary | ICD-10-CM | POA: Diagnosis not present

## 2021-08-26 DIAGNOSIS — E1129 Type 2 diabetes mellitus with other diabetic kidney complication: Secondary | ICD-10-CM | POA: Diagnosis not present

## 2021-08-26 DIAGNOSIS — N170 Acute kidney failure with tubular necrosis: Secondary | ICD-10-CM | POA: Diagnosis not present

## 2021-08-26 DIAGNOSIS — I129 Hypertensive chronic kidney disease with stage 1 through stage 4 chronic kidney disease, or unspecified chronic kidney disease: Secondary | ICD-10-CM | POA: Diagnosis not present

## 2021-08-26 DIAGNOSIS — N189 Chronic kidney disease, unspecified: Secondary | ICD-10-CM | POA: Diagnosis not present

## 2021-08-26 DIAGNOSIS — N19 Unspecified kidney failure: Secondary | ICD-10-CM | POA: Diagnosis not present

## 2021-08-26 DIAGNOSIS — E1122 Type 2 diabetes mellitus with diabetic chronic kidney disease: Secondary | ICD-10-CM | POA: Diagnosis not present

## 2021-08-26 DIAGNOSIS — R809 Proteinuria, unspecified: Secondary | ICD-10-CM | POA: Diagnosis not present

## 2021-09-05 DIAGNOSIS — E538 Deficiency of other specified B group vitamins: Secondary | ICD-10-CM | POA: Diagnosis not present

## 2021-09-23 DIAGNOSIS — Z79890 Hormone replacement therapy (postmenopausal): Secondary | ICD-10-CM | POA: Diagnosis not present

## 2021-09-23 DIAGNOSIS — E1169 Type 2 diabetes mellitus with other specified complication: Secondary | ICD-10-CM | POA: Diagnosis not present

## 2021-09-23 DIAGNOSIS — E785 Hyperlipidemia, unspecified: Secondary | ICD-10-CM | POA: Diagnosis not present

## 2021-09-23 DIAGNOSIS — E1122 Type 2 diabetes mellitus with diabetic chronic kidney disease: Secondary | ICD-10-CM | POA: Diagnosis not present

## 2021-09-23 DIAGNOSIS — J449 Chronic obstructive pulmonary disease, unspecified: Secondary | ICD-10-CM | POA: Diagnosis not present

## 2021-09-23 DIAGNOSIS — D692 Other nonthrombocytopenic purpura: Secondary | ICD-10-CM | POA: Diagnosis not present

## 2021-09-23 DIAGNOSIS — N184 Chronic kidney disease, stage 4 (severe): Secondary | ICD-10-CM | POA: Diagnosis not present

## 2021-09-23 DIAGNOSIS — E039 Hypothyroidism, unspecified: Secondary | ICD-10-CM | POA: Diagnosis not present

## 2021-09-23 DIAGNOSIS — I129 Hypertensive chronic kidney disease with stage 1 through stage 4 chronic kidney disease, or unspecified chronic kidney disease: Secondary | ICD-10-CM | POA: Diagnosis not present

## 2021-10-05 DIAGNOSIS — E538 Deficiency of other specified B group vitamins: Secondary | ICD-10-CM | POA: Diagnosis not present

## 2021-10-27 DIAGNOSIS — N19 Unspecified kidney failure: Secondary | ICD-10-CM | POA: Diagnosis not present

## 2021-10-27 DIAGNOSIS — R809 Proteinuria, unspecified: Secondary | ICD-10-CM | POA: Diagnosis not present

## 2021-10-27 DIAGNOSIS — E1122 Type 2 diabetes mellitus with diabetic chronic kidney disease: Secondary | ICD-10-CM | POA: Diagnosis not present

## 2021-10-27 DIAGNOSIS — E1129 Type 2 diabetes mellitus with other diabetic kidney complication: Secondary | ICD-10-CM | POA: Diagnosis not present

## 2021-10-27 DIAGNOSIS — N170 Acute kidney failure with tubular necrosis: Secondary | ICD-10-CM | POA: Diagnosis not present

## 2021-10-27 DIAGNOSIS — N189 Chronic kidney disease, unspecified: Secondary | ICD-10-CM | POA: Diagnosis not present

## 2021-10-27 DIAGNOSIS — I129 Hypertensive chronic kidney disease with stage 1 through stage 4 chronic kidney disease, or unspecified chronic kidney disease: Secondary | ICD-10-CM | POA: Diagnosis not present

## 2021-11-01 DIAGNOSIS — E1122 Type 2 diabetes mellitus with diabetic chronic kidney disease: Secondary | ICD-10-CM | POA: Diagnosis not present

## 2021-11-01 DIAGNOSIS — N170 Acute kidney failure with tubular necrosis: Secondary | ICD-10-CM | POA: Diagnosis not present

## 2021-11-01 DIAGNOSIS — Z23 Encounter for immunization: Secondary | ICD-10-CM | POA: Diagnosis not present

## 2021-11-01 DIAGNOSIS — D638 Anemia in other chronic diseases classified elsewhere: Secondary | ICD-10-CM | POA: Diagnosis not present

## 2021-11-01 DIAGNOSIS — E538 Deficiency of other specified B group vitamins: Secondary | ICD-10-CM | POA: Diagnosis not present

## 2021-11-01 DIAGNOSIS — N189 Chronic kidney disease, unspecified: Secondary | ICD-10-CM | POA: Diagnosis not present

## 2021-11-01 DIAGNOSIS — N049 Nephrotic syndrome with unspecified morphologic changes: Secondary | ICD-10-CM | POA: Diagnosis not present

## 2021-11-03 DIAGNOSIS — N189 Chronic kidney disease, unspecified: Secondary | ICD-10-CM | POA: Diagnosis not present

## 2021-11-03 DIAGNOSIS — I129 Hypertensive chronic kidney disease with stage 1 through stage 4 chronic kidney disease, or unspecified chronic kidney disease: Secondary | ICD-10-CM | POA: Diagnosis not present

## 2021-11-03 DIAGNOSIS — E1122 Type 2 diabetes mellitus with diabetic chronic kidney disease: Secondary | ICD-10-CM | POA: Diagnosis not present

## 2021-11-03 DIAGNOSIS — N170 Acute kidney failure with tubular necrosis: Secondary | ICD-10-CM | POA: Diagnosis not present

## 2021-11-03 DIAGNOSIS — E1129 Type 2 diabetes mellitus with other diabetic kidney complication: Secondary | ICD-10-CM | POA: Diagnosis not present

## 2021-11-03 DIAGNOSIS — R809 Proteinuria, unspecified: Secondary | ICD-10-CM | POA: Diagnosis not present

## 2021-11-03 DIAGNOSIS — E870 Hyperosmolality and hypernatremia: Secondary | ICD-10-CM | POA: Diagnosis not present

## 2021-11-24 DIAGNOSIS — X32XXXA Exposure to sunlight, initial encounter: Secondary | ICD-10-CM | POA: Diagnosis not present

## 2021-11-24 DIAGNOSIS — L821 Other seborrheic keratosis: Secondary | ICD-10-CM | POA: Diagnosis not present

## 2021-11-24 DIAGNOSIS — L570 Actinic keratosis: Secondary | ICD-10-CM | POA: Diagnosis not present

## 2021-11-25 DIAGNOSIS — E782 Mixed hyperlipidemia: Secondary | ICD-10-CM | POA: Diagnosis not present

## 2021-11-25 DIAGNOSIS — I10 Essential (primary) hypertension: Secondary | ICD-10-CM | POA: Diagnosis not present

## 2021-11-25 DIAGNOSIS — E1129 Type 2 diabetes mellitus with other diabetic kidney complication: Secondary | ICD-10-CM | POA: Diagnosis not present

## 2021-12-19 DIAGNOSIS — E538 Deficiency of other specified B group vitamins: Secondary | ICD-10-CM | POA: Diagnosis not present

## 2021-12-23 DIAGNOSIS — E039 Hypothyroidism, unspecified: Secondary | ICD-10-CM | POA: Diagnosis not present

## 2021-12-23 DIAGNOSIS — Z79890 Hormone replacement therapy (postmenopausal): Secondary | ICD-10-CM | POA: Diagnosis not present

## 2021-12-23 DIAGNOSIS — J449 Chronic obstructive pulmonary disease, unspecified: Secondary | ICD-10-CM | POA: Diagnosis not present

## 2021-12-23 DIAGNOSIS — E1169 Type 2 diabetes mellitus with other specified complication: Secondary | ICD-10-CM | POA: Diagnosis not present

## 2021-12-23 DIAGNOSIS — N184 Chronic kidney disease, stage 4 (severe): Secondary | ICD-10-CM | POA: Diagnosis not present

## 2021-12-23 DIAGNOSIS — E785 Hyperlipidemia, unspecified: Secondary | ICD-10-CM | POA: Diagnosis not present

## 2022-01-11 DIAGNOSIS — E870 Hyperosmolality and hypernatremia: Secondary | ICD-10-CM | POA: Diagnosis not present

## 2022-01-11 DIAGNOSIS — E1122 Type 2 diabetes mellitus with diabetic chronic kidney disease: Secondary | ICD-10-CM | POA: Diagnosis not present

## 2022-01-11 DIAGNOSIS — N189 Chronic kidney disease, unspecified: Secondary | ICD-10-CM | POA: Diagnosis not present

## 2022-01-11 DIAGNOSIS — R809 Proteinuria, unspecified: Secondary | ICD-10-CM | POA: Diagnosis not present

## 2022-01-11 DIAGNOSIS — I129 Hypertensive chronic kidney disease with stage 1 through stage 4 chronic kidney disease, or unspecified chronic kidney disease: Secondary | ICD-10-CM | POA: Diagnosis not present

## 2022-01-11 DIAGNOSIS — E1129 Type 2 diabetes mellitus with other diabetic kidney complication: Secondary | ICD-10-CM | POA: Diagnosis not present

## 2022-01-11 DIAGNOSIS — N170 Acute kidney failure with tubular necrosis: Secondary | ICD-10-CM | POA: Diagnosis not present

## 2022-01-18 DIAGNOSIS — E1129 Type 2 diabetes mellitus with other diabetic kidney complication: Secondary | ICD-10-CM | POA: Diagnosis not present

## 2022-01-18 DIAGNOSIS — G2581 Restless legs syndrome: Secondary | ICD-10-CM | POA: Diagnosis not present

## 2022-01-18 DIAGNOSIS — I129 Hypertensive chronic kidney disease with stage 1 through stage 4 chronic kidney disease, or unspecified chronic kidney disease: Secondary | ICD-10-CM | POA: Diagnosis not present

## 2022-01-18 DIAGNOSIS — R809 Proteinuria, unspecified: Secondary | ICD-10-CM | POA: Diagnosis not present

## 2022-01-18 DIAGNOSIS — N189 Chronic kidney disease, unspecified: Secondary | ICD-10-CM | POA: Diagnosis not present

## 2022-01-18 DIAGNOSIS — E1122 Type 2 diabetes mellitus with diabetic chronic kidney disease: Secondary | ICD-10-CM | POA: Diagnosis not present

## 2022-01-18 DIAGNOSIS — N19 Unspecified kidney failure: Secondary | ICD-10-CM | POA: Diagnosis not present

## 2022-01-19 DIAGNOSIS — E538 Deficiency of other specified B group vitamins: Secondary | ICD-10-CM | POA: Diagnosis not present

## 2022-02-23 DIAGNOSIS — E538 Deficiency of other specified B group vitamins: Secondary | ICD-10-CM | POA: Diagnosis not present

## 2022-03-01 DIAGNOSIS — R809 Proteinuria, unspecified: Secondary | ICD-10-CM | POA: Diagnosis not present

## 2022-03-01 DIAGNOSIS — E1122 Type 2 diabetes mellitus with diabetic chronic kidney disease: Secondary | ICD-10-CM | POA: Diagnosis not present

## 2022-03-01 DIAGNOSIS — N19 Unspecified kidney failure: Secondary | ICD-10-CM | POA: Diagnosis not present

## 2022-03-01 DIAGNOSIS — E1129 Type 2 diabetes mellitus with other diabetic kidney complication: Secondary | ICD-10-CM | POA: Diagnosis not present

## 2022-03-01 DIAGNOSIS — I129 Hypertensive chronic kidney disease with stage 1 through stage 4 chronic kidney disease, or unspecified chronic kidney disease: Secondary | ICD-10-CM | POA: Diagnosis not present

## 2022-03-01 DIAGNOSIS — N189 Chronic kidney disease, unspecified: Secondary | ICD-10-CM | POA: Diagnosis not present

## 2022-03-08 DIAGNOSIS — I129 Hypertensive chronic kidney disease with stage 1 through stage 4 chronic kidney disease, or unspecified chronic kidney disease: Secondary | ICD-10-CM | POA: Diagnosis not present

## 2022-03-08 DIAGNOSIS — R809 Proteinuria, unspecified: Secondary | ICD-10-CM | POA: Diagnosis not present

## 2022-03-08 DIAGNOSIS — N19 Unspecified kidney failure: Secondary | ICD-10-CM | POA: Diagnosis not present

## 2022-03-08 DIAGNOSIS — E1129 Type 2 diabetes mellitus with other diabetic kidney complication: Secondary | ICD-10-CM | POA: Diagnosis not present

## 2022-03-08 DIAGNOSIS — E1122 Type 2 diabetes mellitus with diabetic chronic kidney disease: Secondary | ICD-10-CM | POA: Diagnosis not present

## 2022-03-08 DIAGNOSIS — N189 Chronic kidney disease, unspecified: Secondary | ICD-10-CM | POA: Diagnosis not present

## 2022-03-15 ENCOUNTER — Ambulatory Visit (INDEPENDENT_AMBULATORY_CARE_PROVIDER_SITE_OTHER): Payer: PPO

## 2022-03-15 ENCOUNTER — Ambulatory Visit
Admission: EM | Admit: 2022-03-15 | Discharge: 2022-03-15 | Disposition: A | Discharge status: Home / Self Care | Payer: PPO | Attending: Family Medicine | Admitting: Family Medicine

## 2022-03-15 DIAGNOSIS — S99922D Unspecified injury of left foot, subsequent encounter: Secondary | ICD-10-CM | POA: Diagnosis not present

## 2022-03-15 DIAGNOSIS — W19XXXD Unspecified fall, subsequent encounter: Secondary | ICD-10-CM | POA: Diagnosis not present

## 2022-03-15 DIAGNOSIS — S9032XA Contusion of left foot, initial encounter: Secondary | ICD-10-CM

## 2022-03-15 DIAGNOSIS — M7989 Other specified soft tissue disorders: Secondary | ICD-10-CM | POA: Diagnosis not present

## 2022-03-15 DIAGNOSIS — M79672 Pain in left foot: Secondary | ICD-10-CM

## 2022-03-15 DIAGNOSIS — W19XXXA Unspecified fall, initial encounter: Secondary | ICD-10-CM | POA: Diagnosis not present

## 2022-03-15 NOTE — ED Triage Notes (Signed)
Pt reports pain and swelling in left foot since this morning when she fell on the bathroom.

## 2022-03-15 NOTE — ED Provider Notes (Signed)
[facility]    CSN: [id number] Arrival date & time: 03/15/22  1556      History   Chief Complaint Chief Complaint  Patient presents with   Foot Injury    HPI Lindsay Cortez is a 81 y.o. female.   Presenting today with dorsal left foot and left fourth and fifth toe pain, bruising, mild swelling after falling in the bathroom this morning and landing on top of the foot which got bent underneath her.  She states she felt her toes go in a different direction and thinks they might be broken.  She denies numbness, tingling, loss of range of motion, bleeding or skin injury.  Not tried anything over-the-counter for symptoms thus far.  Did not hit her head or injure anywhere else during the fall.    Past Medical History:  Diagnosis Date   Arthritis    Asthma    Back pain    COPD (chronic obstructive pulmonary disease) ([place])    Diabetes ([place]) 09/01/2014   Diabetes mellitus    x 15 yrs   Gout    Hypertension    Kidney stones    Pancreatic cyst    Pneumonia    Renal disorder    Renal insufficiency    Shoulder dislocation    Vertigo     Patient Active Problem List   Diagnosis Date Noted   CAP (community acquired pneumonia) 02/24/2021   Sepsis due to community-acquired pneumonia 02/24/2021   Hypoalbuminemia 02/24/2021   Hypothyroidism 02/24/2021   Hyperglycemia due to diabetes mellitus ([place]) 02/24/2021   Essential hypertension 02/24/2021   Leukocytosis 07/11/2019   Acute respiratory failure with hypoxia ([place]) 09/02/2014   Chronic kidney disease 09/02/2014   Nausea and vomiting 09/01/2014   COPD (chronic obstructive pulmonary disease) ([place]) 09/01/2014   Dehydration 09/01/2014   Hypoxia 09/01/2014   Diabetes mellitus ([place]) 09/01/2014   Acute exacerbation of COPD with asthma ([place]) 09/01/2014   Pancreatic mass 05/19/2014   Common bile duct calculi 05/19/2014   Right knee pain 09/18/2013   Lateral meniscus derangement 09/18/2013   Arthritis of right knee  09/18/2013    Past Surgical History:  Procedure Laterality Date   ABDOMINAL HYSTERECTOMY     BLADDER SURGERY     bladder tac   BREAST SURGERY     benign   FOOT SURGERY      OB History     Gravida  2   Para  2   Term  2   Preterm      AB      Living  2      SAB      IAB      Ectopic      Multiple      Live Births               Home Medications    Prior to Admission medications   Medication Sig Start Date End Date Taking? Authorizing Provider  acetaminophen (TYLENOL) 500 MG tablet Take 1,000 mg by mouth daily as needed (pain). For pain    [provider]  albuterol (PROVENTIL) (2.5 MG/3ML) 0.083% nebulizer solution Take 3 mLs (2.5 mg total) by nebulization every 4 (four) hours as needed for wheezing. 09/05/14   Samuella Cota, MD  albuterol (VENTOLIN HFA) 108 (90 BASE) MCG/ACT inhaler Inhale 2 puffs into the lungs every 4 (four) hours as needed for wheezing or shortness of breath. For rescue 09/05/14   Samuella Cota, MD  alendronate (FOSAMAX) 70 MG tablet Take 70 mg by mouth once a week. 04/08/20   [provider]  allopurinol (ZYLOPRIM) 100 MG tablet Take 200 mg by mouth daily.    [provider]  amLODipine (NORVASC) 5 MG tablet Take 5 mg by mouth daily.    [provider]  budesonide-formoterol (SYMBICORT) 80-4.5 MCG/ACT inhaler Inhale 2 puffs into the lungs 2 (two) times daily.    [provider]  Calcium Carbonate-Vitamin D (CALCIUM 600 + D PO) Take 1 tablet by mouth daily.    [provider]  Cholecalciferol (VITAMIN D) 2000 UNITS CAPS Take 1 capsule by mouth every evening.    [provider]  cyclobenzaprine (FLEXERIL) 5 MG tablet Take 1 tablet (5 mg total) by mouth at bedtime. 02/11/21   Mordecai Rasmussen, MD  dextromethorphan-guaiFENesin [facility] DM) 30-600 MG 12hr tablet Take 1 tablet by mouth 2 (two) times daily. 03/03/21   Kathie Dike, MD  doxycycline (VIBRAMYCIN) 100 MG capsule Take  1 capsule (100 mg total) by mouth 2 (two) times daily. 06/17/21   Volney American, PA-C  glimepiride (AMARYL) 2 MG tablet Take 2 mg by mouth daily with breakfast. 2 in the morning and 1 at bedtime    [provider]  levothyroxine (SYNTHROID) 75 MCG tablet Take 75 mcg by mouth daily. 10/07/19   [provider]  loperamide (IMODIUM) 2 MG capsule Take 2 mg by mouth every morning.    [provider]  meclizine (ANTIVERT) 25 MG tablet Take 1 tablet (25 mg total) by mouth 3 (three) times daily as needed for dizziness. 11/12/15   Forde Dandy, MD  metoprolol succinate (TOPROL-XL) 50 MG 24 hr tablet Take 50 mg by mouth daily. 03/10/19   [provider]  predniSONE (DELTASONE) 10 MG tablet Take '40mg'$  po daily for 2 days then '30mg'$  daily for 2 days then '20mg'$  daily for 2 days then '10mg'$  daily for 2 days then stop 03/03/21   Kathie Dike, MD  predniSONE (DELTASONE) 20 MG tablet Take 2 tablets (40 mg total) by mouth daily with breakfast. 06/17/21   Volney American, PA-C  promethazine (PHENERGAN) 25 MG suppository Place 1 suppository (25 mg total) rectally every 6 (six) hours as needed for nausea or vomiting. 11/12/15   Forde Dandy, MD  simvastatin (ZOCOR) 10 MG tablet Take 10 mg by mouth at bedtime.    [provider]  sodium bicarbonate 650 MG tablet Take by mouth 3 (three) times daily. 02/04/21   [provider]    Family History Family History  Problem Relation Age of Onset   Breast cancer Mother    Emphysema Father    COPD Sister    Atrial fibrillation Sister    Obesity Brother    Cirrhosis Daughter    COPD Son    Diabetes Brother    Asthma Brother     Social History Social History   Tobacco Use   Smoking status: Never   Smokeless tobacco: Never  Substance Use Topics   Alcohol use: No    Alcohol/week: 0.0 standard drinks of alcohol   Drug use: No     Allergies   Fentanyl, Codeine, and Levaquin [levofloxacin in  d5w]   Review of Systems Review of Systems Per HPI  Physical Exam Triage Vital Signs ED Triage Vitals  Enc Vitals Group     BP 03/15/22 1605 130/72     Pulse Rate 03/15/22 1605 82     Resp  03/15/22 1605 18     Temp 03/15/22 1605 98.2 F (36.8 C)     Temp Source 03/15/22 1605 Oral     SpO2 03/15/22 1605 94 %     Weight --      Height --      Head Circumference --      Peak Flow --      Pain Score 03/15/22 1604 7     Pain Loc --      Pain Edu? --      Excl. in [place]? --    No data found.  Updated Vital Signs BP 130/72 (BP Location: Right Arm)   Pulse 82   Temp 98.2 F (36.8 C) (Oral)   Resp 18   SpO2 94%   Visual Acuity Right Eye Distance:   Left Eye Distance:   Bilateral Distance:    Right Eye Near:   Left Eye Near:    Bilateral Near:     Physical Exam Vitals and nursing note reviewed.  Constitutional:      Appearance: Normal appearance. She is not ill-appearing.  HENT:     Head: Atraumatic.  Eyes:     Extraocular Movements: Extraocular movements intact.     Conjunctiva/sclera: Conjunctivae normal.  Cardiovascular:     Rate and Rhythm: Normal rate and regular rhythm.  Pulmonary:     Effort: Pulmonary effort is normal. No respiratory distress.  Musculoskeletal:        General: Swelling, tenderness and signs of injury present. No deformity. Normal range of motion.     Cervical back: Normal range of motion and neck supple.     Comments: No bony deformities palpable, range of motion fully intact, normal gait.  Diffusely tender to palpation distally  Skin:    General: Skin is warm and dry.     Findings: Bruising present.     Comments: Dorsal left foot and left fourth and fifth toes with bruising, mild edema  Neurological:     Mental Status: She is alert and oriented to person, place, and time.     Comments: Left lower extremity neurovascularly intact  Psychiatric:        Mood and Affect: Mood normal.        Thought Content: Thought content normal.         Judgment: Judgment normal.      UC Treatments / Results  Labs (all labs ordered are listed, but only abnormal results are displayed) Labs Reviewed - No data to display  EKG   Radiology DG Foot Complete Left  Result Date: 03/15/2022 CLINICAL DATA:  pain, swelling in her left foot since this morning after fall in the bathroom EXAM: LEFT FOOT - COMPLETE 3+ VIEW COMPARISON:  None Available. FINDINGS: There is no evidence of fracture or dislocation. There is some deformity seen at the distal first metatarsal of likely old healed fracture. Mild osteoarthritis with some narrowing of the tarsometatarsal joints. Prominent bony spur at the plantar calcaneus. Osteopenia. Soft tissues are unremarkable. IMPRESSION: No acute fracture or dislocation. Likely old healed fracture at the distal first metatarsal. Mild degenerative changes. Electronically Signed   By: Frazier Richards M.D.   On: 03/15/2022 16:21    Procedures Procedures (including critical care time)  Medications Ordered in UC Medications - No data to display  Initial Impression / Assessment and Plan / UC Course  I have reviewed the triage vital signs and the nursing notes.  Pertinent labs & imaging results that were available during  my care of the patient were reviewed by me and considered in my medical decision making (see chart for details).     X-ray of the left foot negative for acute bony abnormality.  Discussed RICE protocol, return precautions.  No other apparent injuries from fall.  Final Clinical Impressions(s) / UC Diagnoses   Final diagnoses:  Contusion of left foot, initial encounter  Fall, initial encounter   Discharge Instructions   None    ED Prescriptions   None    PDMP not reviewed this encounter.   Volney American, [place] 03/15/22 1646

## 2022-03-17 DIAGNOSIS — S9032XD Contusion of left foot, subsequent encounter: Secondary | ICD-10-CM | POA: Diagnosis not present

## 2022-03-17 DIAGNOSIS — D692 Other nonthrombocytopenic purpura: Secondary | ICD-10-CM | POA: Diagnosis not present

## 2022-03-17 DIAGNOSIS — M40209 Unspecified kyphosis, site unspecified: Secondary | ICD-10-CM | POA: Diagnosis not present

## 2022-03-28 DIAGNOSIS — J449 Chronic obstructive pulmonary disease, unspecified: Secondary | ICD-10-CM | POA: Diagnosis not present

## 2022-03-28 DIAGNOSIS — E1165 Type 2 diabetes mellitus with hyperglycemia: Secondary | ICD-10-CM | POA: Diagnosis not present

## 2022-03-28 DIAGNOSIS — Z1331 Encounter for screening for depression: Secondary | ICD-10-CM | POA: Diagnosis not present

## 2022-03-28 DIAGNOSIS — Z6828 Body mass index (BMI) 28.0-28.9, adult: Secondary | ICD-10-CM | POA: Diagnosis not present

## 2022-03-28 DIAGNOSIS — E663 Overweight: Secondary | ICD-10-CM | POA: Diagnosis not present

## 2022-03-28 DIAGNOSIS — E039 Hypothyroidism, unspecified: Secondary | ICD-10-CM | POA: Diagnosis not present

## 2022-03-28 DIAGNOSIS — M1991 Primary osteoarthritis, unspecified site: Secondary | ICD-10-CM | POA: Diagnosis not present

## 2022-03-28 DIAGNOSIS — I10 Essential (primary) hypertension: Secondary | ICD-10-CM | POA: Diagnosis not present

## 2022-03-28 DIAGNOSIS — Z0001 Encounter for general adult medical examination with abnormal findings: Secondary | ICD-10-CM | POA: Diagnosis not present

## 2022-03-28 DIAGNOSIS — E559 Vitamin D deficiency, unspecified: Secondary | ICD-10-CM | POA: Diagnosis not present

## 2022-03-28 DIAGNOSIS — D518 Other vitamin B12 deficiency anemias: Secondary | ICD-10-CM | POA: Diagnosis not present

## 2022-03-28 DIAGNOSIS — I700 Atherosclerosis of aorta: Secondary | ICD-10-CM | POA: Diagnosis not present

## 2022-04-25 DIAGNOSIS — D518 Other vitamin B12 deficiency anemias: Secondary | ICD-10-CM | POA: Diagnosis not present

## 2022-05-10 DIAGNOSIS — E1129 Type 2 diabetes mellitus with other diabetic kidney complication: Secondary | ICD-10-CM | POA: Diagnosis not present

## 2022-05-10 DIAGNOSIS — N19 Unspecified kidney failure: Secondary | ICD-10-CM | POA: Diagnosis not present

## 2022-05-10 DIAGNOSIS — N189 Chronic kidney disease, unspecified: Secondary | ICD-10-CM | POA: Diagnosis not present

## 2022-05-10 DIAGNOSIS — R809 Proteinuria, unspecified: Secondary | ICD-10-CM | POA: Diagnosis not present

## 2022-05-10 DIAGNOSIS — E1122 Type 2 diabetes mellitus with diabetic chronic kidney disease: Secondary | ICD-10-CM | POA: Diagnosis not present

## 2022-05-10 DIAGNOSIS — I129 Hypertensive chronic kidney disease with stage 1 through stage 4 chronic kidney disease, or unspecified chronic kidney disease: Secondary | ICD-10-CM | POA: Diagnosis not present

## 2022-05-12 DIAGNOSIS — E119 Type 2 diabetes mellitus without complications: Secondary | ICD-10-CM | POA: Diagnosis not present

## 2022-05-12 DIAGNOSIS — Z7984 Long term (current) use of oral hypoglycemic drugs: Secondary | ICD-10-CM | POA: Diagnosis not present

## 2022-05-12 DIAGNOSIS — J449 Chronic obstructive pulmonary disease, unspecified: Secondary | ICD-10-CM | POA: Diagnosis not present

## 2022-05-15 ENCOUNTER — Encounter: Payer: Self-pay | Admitting: *Deleted

## 2022-05-15 ENCOUNTER — Ambulatory Visit
Admission: EM | Admit: 2022-05-15 | Discharge: 2022-05-15 | Disposition: A | Discharge status: Home / Self Care | Payer: PPO | Attending: Family Medicine | Admitting: Family Medicine

## 2022-05-15 DIAGNOSIS — J441 Chronic obstructive pulmonary disease with (acute) exacerbation: Secondary | ICD-10-CM

## 2022-05-15 DIAGNOSIS — J22 Unspecified acute lower respiratory infection: Secondary | ICD-10-CM | POA: Diagnosis not present

## 2022-05-15 MED ORDER — DOXYCYCLINE HYCLATE 100 MG PO CAPS: 100.0000 mg | ORAL_CAPSULE | Freq: Two times a day (BID) | ORAL | 0 refills | Status: DC

## 2022-05-15 MED ORDER — IPRATROPIUM-ALBUTEROL 0.5-2.5 (3) MG/3ML IN SOLN
3.0000 mL | Freq: Once | RESPIRATORY_TRACT | Status: AC
  Administered 2022-05-15: 3 mL via RESPIRATORY_TRACT

## 2022-05-15 MED ORDER — IPRATROPIUM-ALBUTEROL 0.5-2.5 (3) MG/3ML IN SOLN: 3.0000 mL | RESPIRATORY_TRACT | 0 refills | Status: DC | PRN

## 2022-05-15 NOTE — ED Provider Notes (Signed)
[facility]    CSN: [id number] Arrival date & time: 05/15/22  1154      History   Chief Complaint Chief Complaint  Patient presents with   Cough   Nasal Congestion    HPI Lindsay Cortez is a 81 y.o. female.   Presenting today with 6-day history of progressively worsening productive cough, nasal congestion, wheezing, chest tightness.  Denies fever, chills, chest pain, shortness of breath, abdominal pain, nausea vomiting or diarrhea.  She states nurse from her insurance company came to her house on Friday and listened to her chest and set her up with a virtual appointment with a physician who spoke with her and prescribed prednisone in addition to her typical inhaler regimen for her COPD to consist of Symbicort and albuterol as needed.  She states this has not helped and her cough is continued to worsen.  Has been having to take her rescue inhaler numerous times daily with only minimal relief.    Past Medical History:  Diagnosis Date   Arthritis    Asthma    Back pain    COPD (chronic obstructive pulmonary disease) ([place])    Diabetes ([place]) 09/01/2014   Diabetes mellitus    x 15 yrs   Gout    Hypertension    Kidney stones    Pancreatic cyst    Pneumonia    Renal disorder    Renal insufficiency    Shoulder dislocation    Vertigo     Patient Active Problem List   Diagnosis Date Noted   CAP (community acquired pneumonia) 02/24/2021   Sepsis due to community-acquired pneumonia 02/24/2021   Hypoalbuminemia 02/24/2021   Hypothyroidism 02/24/2021   Hyperglycemia due to diabetes mellitus ([place]) 02/24/2021   Essential hypertension 02/24/2021   Leukocytosis 07/11/2019   Acute respiratory failure with hypoxia ([place]) 09/02/2014   Chronic kidney disease 09/02/2014   Nausea and vomiting 09/01/2014   COPD (chronic obstructive pulmonary disease) ([place]) 09/01/2014   Dehydration 09/01/2014   Hypoxia 09/01/2014   Diabetes mellitus ([place]) 09/01/2014   Acute exacerbation  of COPD with asthma ([place]) 09/01/2014   Pancreatic mass 05/19/2014   Common bile duct calculi 05/19/2014   Right knee pain 09/18/2013   Lateral meniscus derangement 09/18/2013   Arthritis of right knee 09/18/2013    Past Surgical History:  Procedure Laterality Date   ABDOMINAL HYSTERECTOMY     BLADDER SURGERY     bladder tac   BREAST SURGERY     benign   FOOT SURGERY      OB History     Gravida  2   Para  2   Term  2   Preterm      AB      Living  2      SAB      IAB      Ectopic      Multiple      Live Births               Home Medications    Prior to Admission medications   Medication Sig Start Date End Date Taking? Authorizing Provider  acetaminophen (TYLENOL) 500 MG tablet Take 1,000 mg by mouth daily as needed (pain). For pain   Yes [provider]  albuterol (PROVENTIL) (2.5 MG/3ML) 0.083% nebulizer solution Take 3 mLs (2.5 mg total) by nebulization every 4 (four) hours as needed for wheezing. 09/05/14  Yes Samuella Cota, MD  albuterol (VENTOLIN HFA) 108 (90 BASE) MCG/ACT  inhaler Inhale 2 puffs into the lungs every 4 (four) hours as needed for wheezing or shortness of breath. For rescue 09/05/14  Yes Samuella Cota, MD  alendronate (FOSAMAX) 70 MG tablet Take 70 mg by mouth once a week. 04/08/20  Yes [provider]  allopurinol (ZYLOPRIM) 100 MG tablet Take 200 mg by mouth daily.   Yes [provider]  amLODipine (NORVASC) 5 MG tablet Take 5 mg by mouth daily.   Yes [provider]  budesonide-formoterol (SYMBICORT) 80-4.5 MCG/ACT inhaler Inhale 2 puffs into the lungs 2 (two) times daily.   Yes [provider]  Calcium Carbonate-Vitamin D (CALCIUM 600 + D PO) Take 1 tablet by mouth daily.   Yes [provider]  Cholecalciferol (VITAMIN D) 2000 UNITS CAPS Take 1 capsule by mouth every evening.   Yes [provider]  cyclobenzaprine (FLEXERIL) 5 MG tablet Take 1 tablet (5 mg total) by  mouth at bedtime. 02/11/21  Yes Mordecai Rasmussen, MD  dextromethorphan-guaiFENesin [facility] DM) 30-600 MG 12hr tablet Take 1 tablet by mouth 2 (two) times daily. 03/03/21  Yes Kathie Dike, MD  doxycycline (VIBRAMYCIN) 100 MG capsule Take 1 capsule (100 mg total) by mouth 2 (two) times daily. 06/17/21  Yes Volney American, PA-C  doxycycline (VIBRAMYCIN) 100 MG capsule Take 1 capsule (100 mg total) by mouth 2 (two) times daily. 05/15/22  Yes Volney American, PA-C  glimepiride (AMARYL) 2 MG tablet Take 2 mg by mouth daily with breakfast. 2 in the morning and 1 at bedtime   Yes [provider]  ipratropium-albuterol (DUONEB) 0.5-2.5 (3) MG/3ML SOLN Take 3 mLs by nebulization every 4 (four) hours as needed. 05/15/22  Yes Volney American, PA-C  levothyroxine (SYNTHROID) 75 MCG tablet Take 75 mcg by mouth daily. 10/07/19  Yes [provider]  loperamide (IMODIUM) 2 MG capsule Take 2 mg by mouth every morning.   Yes [provider]  meclizine (ANTIVERT) 25 MG tablet Take 1 tablet (25 mg total) by mouth 3 (three) times daily as needed for dizziness. 11/12/15  Yes Forde Dandy, MD  metoprolol succinate (TOPROL-XL) 50 MG 24 hr tablet Take 50 mg by mouth daily. 03/10/19  Yes [provider]  predniSONE (DELTASONE) 10 MG tablet Take '40mg'$  po daily for 2 days then '30mg'$  daily for 2 days then '20mg'$  daily for 2 days then '10mg'$  daily for 2 days then stop 03/03/21  Yes Kathie Dike, MD  predniSONE (DELTASONE) 20 MG tablet Take 2 tablets (40 mg total) by mouth daily with breakfast. 06/17/21  Yes Volney American, PA-C  promethazine (PHENERGAN) 25 MG suppository Place 1 suppository (25 mg total) rectally every 6 (six) hours as needed for nausea or vomiting. 11/12/15  Yes Forde Dandy, MD  simvastatin (ZOCOR) 10 MG tablet Take 10 mg by mouth at bedtime.   Yes [provider]  sodium bicarbonate 650 MG tablet Take by mouth 3 (three) times daily. 02/04/21  Yes  [provider]    Family History Family History  Problem Relation Age of Onset   Breast cancer Mother    Emphysema Father    COPD Sister    Atrial fibrillation Sister    Obesity Brother    Cirrhosis Daughter    COPD Son    Diabetes Brother    Asthma Brother     Social History Social History   Tobacco Use   Smoking status: Never   Smokeless tobacco: Never  Substance Use  Topics   Alcohol use: No    Alcohol/week: 0.0 standard drinks of alcohol   Drug use: No     Allergies   Fentanyl, Codeine, and Levaquin [levofloxacin in d5w]   Review of Systems Review of Systems PER HPI  Physical Exam Triage Vital Signs ED Triage Vitals  Enc Vitals Group     BP 05/15/22 1252 (!) 165/83     Pulse Rate 05/15/22 1252 76     Resp 05/15/22 1252 20     Temp 05/15/22 1252 98.9 F (37.2 C)     Temp Source 05/15/22 1252 Oral     SpO2 05/15/22 1252 95 %     Weight --      Height --      Head Circumference --      Peak Flow --      Pain Score 05/15/22 1251 0     Pain Loc --      Pain Edu? --      Excl. in [place]? --    No data found.  Updated Vital Signs BP (!) 165/83 (BP Location: Right Arm)   Pulse 76   Temp 98.9 F (37.2 C) (Oral)   Resp 20   SpO2 95%   Visual Acuity Right Eye Distance:   Left Eye Distance:   Bilateral Distance:    Right Eye Near:   Left Eye Near:    Bilateral Near:     Physical Exam Vitals and nursing note reviewed.  Constitutional:      Appearance: Normal appearance. She is not ill-appearing.  HENT:     Head: Atraumatic.     Right Ear: Tympanic membrane normal.     Left Ear: Tympanic membrane normal.     Nose: Congestion present.     Mouth/Throat:     Mouth: Mucous membranes are moist.     Pharynx: Posterior oropharyngeal erythema present.  Eyes:     Extraocular Movements: Extraocular movements intact.     Conjunctiva/sclera: Conjunctivae normal.  Cardiovascular:     Rate and Rhythm: Normal rate and regular rhythm.     Heart  sounds: Normal heart sounds.  Pulmonary:     Effort: Pulmonary effort is normal.     Breath sounds: Wheezing present. No rales.     Comments: No respiratory distress, speaking in full sentences Musculoskeletal:        General: Normal range of motion.     Cervical back: Normal range of motion and neck supple.  Skin:    General: Skin is warm and dry.  Neurological:     Mental Status: She is alert and oriented to person, place, and time.  Psychiatric:        Mood and Affect: Mood normal.        Thought Content: Thought content normal.        Judgment: Judgment normal.      UC Treatments / Results  Labs (all labs ordered are listed, but only abnormal results are displayed) Labs Reviewed - No data to display  EKG   Radiology No results found.  Procedures Procedures (including critical care time)  Medications Ordered in UC Medications  ipratropium-albuterol (DUONEB) 0.5-2.5 (3) MG/3ML nebulizer solution 3 mL (3 mLs Nebulization Given 05/15/22 1307)    Initial Impression / Assessment and Plan / UC Course  I have reviewed the triage vital signs and the nursing notes.  Pertinent labs & imaging results that were available during my care of the patient were reviewed by me  and considered in my medical decision making (see chart for details).     Mildly hypertensive in triage, otherwise vital signs reassuring.  Significant improvement after DuoNeb treatment but still wheezes at bases.  We will send DuoNeb solution for home use, continue Symbicort and albuterol regimen, doxycycline, complete prednisone course.  Return for any worsening symptoms.  Final Clinical Impressions(s) / UC Diagnoses   Final diagnoses:  Lower respiratory infection  COPD exacerbation ([place])     Discharge Instructions      Plain Mucinex and Coricidin HBP are both safe options to take over-the-counter for cough and congestion.  You may also take Flonase, antihistamines    ED Prescriptions      Medication Sig Dispense Auth. Provider   doxycycline (VIBRAMYCIN) 100 MG capsule Take 1 capsule (100 mg total) by mouth 2 (two) times daily. 14 capsule Volney American, [place]   ipratropium-albuterol (DUONEB) 0.5-2.5 (3) MG/3ML SOLN Take 3 mLs by nebulization every 4 (four) hours as needed. 360 mL Volney American, [place]      PDMP not reviewed this encounter.   Volney American, [place] 05/15/22 1347

## 2022-05-15 NOTE — Discharge Instructions (Signed)
Plain Mucinex and Coricidin HBP are both safe options to take over-the-counter for cough and congestion.  You may also take Flonase, antihistamines

## 2022-05-15 NOTE — ED Triage Notes (Signed)
Pt states that she has cough and congestion since last Wednesday. She has a Therapist, sports come to her house and she was seen virtual on Friday and the MD gave her prednisone which she has been taking without relief.

## 2022-05-16 DIAGNOSIS — R739 Hyperglycemia, unspecified: Secondary | ICD-10-CM | POA: Diagnosis not present

## 2022-05-16 DIAGNOSIS — T380X5D Adverse effect of glucocorticoids and synthetic analogues, subsequent encounter: Secondary | ICD-10-CM | POA: Diagnosis not present

## 2022-05-16 DIAGNOSIS — J449 Chronic obstructive pulmonary disease, unspecified: Secondary | ICD-10-CM | POA: Diagnosis not present

## 2022-05-16 DIAGNOSIS — Z7984 Long term (current) use of oral hypoglycemic drugs: Secondary | ICD-10-CM | POA: Diagnosis not present

## 2022-05-16 DIAGNOSIS — E119 Type 2 diabetes mellitus without complications: Secondary | ICD-10-CM | POA: Diagnosis not present

## 2022-05-18 DIAGNOSIS — N19 Unspecified kidney failure: Secondary | ICD-10-CM | POA: Diagnosis not present

## 2022-05-18 DIAGNOSIS — R808 Other proteinuria: Secondary | ICD-10-CM | POA: Diagnosis not present

## 2022-05-18 DIAGNOSIS — N189 Chronic kidney disease, unspecified: Secondary | ICD-10-CM | POA: Diagnosis not present

## 2022-05-18 DIAGNOSIS — E870 Hyperosmolality and hypernatremia: Secondary | ICD-10-CM | POA: Diagnosis not present

## 2022-05-18 DIAGNOSIS — I129 Hypertensive chronic kidney disease with stage 1 through stage 4 chronic kidney disease, or unspecified chronic kidney disease: Secondary | ICD-10-CM | POA: Diagnosis not present

## 2022-05-18 DIAGNOSIS — E1122 Type 2 diabetes mellitus with diabetic chronic kidney disease: Secondary | ICD-10-CM | POA: Diagnosis not present

## 2022-05-22 DIAGNOSIS — J449 Chronic obstructive pulmonary disease, unspecified: Secondary | ICD-10-CM | POA: Diagnosis not present

## 2022-05-22 DIAGNOSIS — Z7984 Long term (current) use of oral hypoglycemic drugs: Secondary | ICD-10-CM | POA: Diagnosis not present

## 2022-05-22 DIAGNOSIS — T380X5D Adverse effect of glucocorticoids and synthetic analogues, subsequent encounter: Secondary | ICD-10-CM | POA: Diagnosis not present

## 2022-05-22 DIAGNOSIS — E1165 Type 2 diabetes mellitus with hyperglycemia: Secondary | ICD-10-CM | POA: Diagnosis not present

## 2022-05-22 DIAGNOSIS — Z6831 Body mass index (BMI) 31.0-31.9, adult: Secondary | ICD-10-CM | POA: Diagnosis not present

## 2022-05-29 DIAGNOSIS — D518 Other vitamin B12 deficiency anemias: Secondary | ICD-10-CM | POA: Diagnosis not present

## 2022-05-29 DIAGNOSIS — Z23 Encounter for immunization: Secondary | ICD-10-CM | POA: Diagnosis not present

## 2022-06-16 DIAGNOSIS — J449 Chronic obstructive pulmonary disease, unspecified: Secondary | ICD-10-CM | POA: Diagnosis not present

## 2022-06-16 DIAGNOSIS — D692 Other nonthrombocytopenic purpura: Secondary | ICD-10-CM | POA: Diagnosis not present

## 2022-06-16 DIAGNOSIS — N184 Chronic kidney disease, stage 4 (severe): Secondary | ICD-10-CM | POA: Diagnosis not present

## 2022-07-11 DIAGNOSIS — Z6832 Body mass index (BMI) 32.0-32.9, adult: Secondary | ICD-10-CM | POA: Diagnosis not present

## 2022-07-11 DIAGNOSIS — J449 Chronic obstructive pulmonary disease, unspecified: Secondary | ICD-10-CM | POA: Diagnosis not present

## 2022-07-16 ENCOUNTER — Ambulatory Visit
Admission: RE | Admit: 2022-07-16 | Discharge: 2022-07-16 | Disposition: A | Discharge status: Home / Self Care | Payer: PPO | Source: Ambulatory Visit | Attending: Family Medicine | Admitting: Family Medicine

## 2022-07-16 VITALS — BP 142/82 | HR 80 | Temp 98.0°F | Resp 20

## 2022-07-16 DIAGNOSIS — J069 Acute upper respiratory infection, unspecified: Secondary | ICD-10-CM | POA: Diagnosis not present

## 2022-07-16 DIAGNOSIS — J441 Chronic obstructive pulmonary disease with (acute) exacerbation: Secondary | ICD-10-CM | POA: Diagnosis not present

## 2022-07-16 MED ORDER — METHYLPREDNISOLONE SODIUM SUCC 125 MG IJ SOLR
80.0000 mg | Freq: Once | INTRAMUSCULAR | Status: AC
  Administered 2022-07-16: 80 mg via INTRAMUSCULAR

## 2022-07-16 MED ORDER — PREDNISONE 20 MG PO TABS: 40.0000 mg | ORAL_TABLET | Freq: Every day | ORAL | 0 refills | Status: DC

## 2022-07-16 MED ORDER — IPRATROPIUM-ALBUTEROL 0.5-2.5 (3) MG/3ML IN SOLN
3.0000 mL | Freq: Once | RESPIRATORY_TRACT | Status: AC
  Administered 2022-07-16: 3 mL via RESPIRATORY_TRACT

## 2022-07-16 MED ORDER — GUAIFENESIN ER 600 MG PO TB12: 600.0000 mg | ORAL_TABLET | Freq: Two times a day (BID) | ORAL | 0 refills | Status: DC

## 2022-07-16 NOTE — ED Triage Notes (Signed)
Pt reports she is congested, feels like its in her lungs, coughing up mucus which started last Monday.  Her home care nurse prescribed her prednisone (5 days) and doxycyline on Tuesday. Meds  provided no relief.

## 2022-07-16 NOTE — ED Provider Notes (Signed)
[facility]    CSN: [id number] Arrival date & time: 07/16/22  0906      History   Chief Complaint Chief Complaint  Patient presents with   Cough    Entered by patient    HPI KRISTAN BRUMMITT is a 81 y.o. female.   Patient presenting today with a week of shortness of breath, wheezing, productive cough of gray-yellow sputum, fatigue, nasal congestion.  Denies known fevers, chest pain, abdominal pain, nausea vomiting or diarrhea.  States her home care nurse prescribed her 5 days of prednisone and doxycycline on Tuesday of last week and she is almost done with these.  They have not provided much relief per patient.  She also takes Symbicort daily and her albuterol inhaler as needed which she states does help somewhat.  History of significant COPD, pneumonia.    Past Medical History:  Diagnosis Date   Arthritis    Asthma    Back pain    COPD (chronic obstructive pulmonary disease) ([place])    Diabetes ([address]) 09/01/2014   Diabetes mellitus    x 15 yrs   Gout    Hypertension    Kidney stones    Pancreatic cyst    Pneumonia    Renal disorder    Renal insufficiency    Shoulder dislocation    Vertigo     Patient Active Problem List   Diagnosis Date Noted   CAP (community acquired pneumonia) 02/24/2021   Sepsis due to community-acquired pneumonia 02/24/2021   Hypoalbuminemia 02/24/2021   Hypothyroidism 02/24/2021   Hyperglycemia due to diabetes mellitus ([place]) 02/24/2021   Essential hypertension 02/24/2021   Leukocytosis 07/11/2019   Acute respiratory failure with hypoxia ([place]) 09/02/2014   Chronic kidney disease 09/02/2014   Nausea and vomiting 09/01/2014   COPD (chronic obstructive pulmonary disease) ([place]) 09/01/2014   Dehydration 09/01/2014   Hypoxia 09/01/2014   Diabetes mellitus ([place]) 09/01/2014   Acute exacerbation of COPD with asthma ([place]) 09/01/2014   Pancreatic mass 05/19/2014   Common bile duct calculi 05/19/2014   Right knee pain 09/18/2013    Lateral meniscus derangement 09/18/2013   Arthritis of right knee 09/18/2013    Past Surgical History:  Procedure Laterality Date   ABDOMINAL HYSTERECTOMY     BLADDER SURGERY     bladder tac   BREAST SURGERY     benign   FOOT SURGERY      OB History     Gravida  2   Para  2   Term  2   Preterm      AB      Living  2      SAB      IAB      Ectopic      Multiple      Live Births               Home Medications    Prior to Admission medications   Medication Sig Start Date End Date Taking? Authorizing Provider  guaiFENesin (MUCINEX) 600 MG 12 hr tablet Take 1 tablet (600 mg total) by mouth 2 (two) times daily. 07/16/22  Yes Volney American, PA-C  predniSONE (DELTASONE) 20 MG tablet Take 2 tablets (40 mg total) by mouth daily with breakfast. 07/16/22  Yes Volney American, PA-C  acetaminophen (TYLENOL) 500 MG tablet Take 1,000 mg by mouth daily as needed (pain). For pain    [provider]  albuterol (PROVENTIL) (2.5 MG/3ML) 0.083% nebulizer solution Take 3 mLs (  2.5 mg total) by nebulization every 4 (four) hours as needed for wheezing. 09/05/14   Samuella Cota, MD  albuterol (VENTOLIN HFA) 108 (90 BASE) MCG/ACT inhaler Inhale 2 puffs into the lungs every 4 (four) hours as needed for wheezing or shortness of breath. For rescue 09/05/14   Samuella Cota, MD  alendronate (FOSAMAX) 70 MG tablet Take 70 mg by mouth once a week. 04/08/20   [provider]  allopurinol (ZYLOPRIM) 100 MG tablet Take 200 mg by mouth daily.    [provider]  amLODipine (NORVASC) 5 MG tablet Take 5 mg by mouth daily.    [provider]  budesonide-formoterol (SYMBICORT) 80-4.5 MCG/ACT inhaler Inhale 2 puffs into the lungs 2 (two) times daily.    [provider]  Calcium Carbonate-Vitamin D (CALCIUM 600 + D PO) Take 1 tablet by mouth daily.    [provider]  Cholecalciferol (VITAMIN D) 2000 UNITS CAPS Take 1 capsule  by mouth every evening.    [provider]  cyclobenzaprine (FLEXERIL) 5 MG tablet Take 1 tablet (5 mg total) by mouth at bedtime. 02/11/21   Mordecai Rasmussen, MD  dextromethorphan-guaiFENesin [facility] DM) 30-600 MG 12hr tablet Take 1 tablet by mouth 2 (two) times daily. 03/03/21   Kathie Dike, MD  doxycycline (VIBRAMYCIN) 100 MG capsule Take 1 capsule (100 mg total) by mouth 2 (two) times daily. 06/17/21   Volney American, PA-C  doxycycline (VIBRAMYCIN) 100 MG capsule Take 1 capsule (100 mg total) by mouth 2 (two) times daily. 05/15/22   Volney American, PA-C  glimepiride (AMARYL) 2 MG tablet Take 2 mg by mouth daily with breakfast. 2 in the morning and 1 at bedtime    [provider]  ipratropium-albuterol (DUONEB) 0.5-2.5 (3) MG/3ML SOLN Take 3 mLs by nebulization every 4 (four) hours as needed. 05/15/22   Volney American, PA-C  levothyroxine (SYNTHROID) 75 MCG tablet Take 75 mcg by mouth daily. 10/07/19   [provider]  loperamide (IMODIUM) 2 MG capsule Take 2 mg by mouth every morning.    [provider]  meclizine (ANTIVERT) 25 MG tablet Take 1 tablet (25 mg total) by mouth 3 (three) times daily as needed for dizziness. 11/12/15   Forde Dandy, MD  metoprolol succinate (TOPROL-XL) 50 MG 24 hr tablet Take 50 mg by mouth daily. 03/10/19   [provider]  predniSONE (DELTASONE) 10 MG tablet Take '40mg'$  po daily for 2 days then '30mg'$  daily for 2 days then '20mg'$  daily for 2 days then '10mg'$  daily for 2 days then stop 03/03/21   Kathie Dike, MD  predniSONE (DELTASONE) 20 MG tablet Take 2 tablets (40 mg total) by mouth daily with breakfast. 06/17/21   Volney American, PA-C  promethazine (PHENERGAN) 25 MG suppository Place 1 suppository (25 mg total) rectally every 6 (six) hours as needed for nausea or vomiting. 11/12/15   Forde Dandy, MD  simvastatin (ZOCOR) 10 MG tablet Take 10 mg by mouth at bedtime.    [provider]   sodium bicarbonate 650 MG tablet Take by mouth 3 (three) times daily. 02/04/21   [provider]    Family History Family History  Problem Relation Age of Onset   Breast cancer Mother    Emphysema Father    COPD Sister    Atrial fibrillation Sister    Obesity Brother    Cirrhosis Daughter    COPD Son    Diabetes Brother  Asthma Brother     Social History Social History   Tobacco Use   Smoking status: Never   Smokeless tobacco: Never  Substance Use Topics   Alcohol use: No    Alcohol/week: 0.0 standard drinks of alcohol   Drug use: No     Allergies   Fentanyl, Codeine, and Levaquin [levofloxacin in d5w]   Review of Systems Review of Systems Per HPI  Physical Exam Triage Vital Signs ED Triage Vitals  Enc Vitals Group     BP 07/16/22 0929 (!) 142/82     Pulse Rate 07/16/22 0929 80     Resp 07/16/22 0929 20     Temp 07/16/22 0929 98 F (36.7 C)     Temp Source 07/16/22 0929 Oral     SpO2 07/16/22 0929 93 %     Weight --      Height --      Head Circumference --      Peak Flow --      Pain Score 07/16/22 0933 0     Pain Loc --      Pain Edu? --      Excl. in [place]? --    No data found.  Updated Vital Signs BP (!) 142/82 (BP Location: Right Arm)   Pulse 80   Temp 98 F (36.7 C) (Oral)   Resp 20   SpO2 93%   Visual Acuity Right Eye Distance:   Left Eye Distance:   Bilateral Distance:    Right Eye Near:   Left Eye Near:    Bilateral Near:     Physical Exam Vitals and nursing note reviewed.  Constitutional:      Appearance: Normal appearance.  HENT:     Head: Atraumatic.     Right Ear: Tympanic membrane and external ear normal.     Left Ear: Tympanic membrane and external ear normal.     Nose: Congestion present.     Mouth/Throat:     Mouth: Mucous membranes are moist.     Pharynx: Posterior oropharyngeal erythema present.  Eyes:     Extraocular Movements: Extraocular movements intact.     Conjunctiva/sclera: Conjunctivae  normal.  Cardiovascular:     Rate and Rhythm: Normal rate and regular rhythm.     Heart sounds: Normal heart sounds.  Pulmonary:     Effort: Pulmonary effort is normal.     Breath sounds: Wheezing present. No rales.     Comments: Decreased breath sounds throughout Musculoskeletal:        General: Normal range of motion.     Cervical back: Normal range of motion and neck supple.  Skin:    General: Skin is warm and dry.  Neurological:     Mental Status: She is alert and oriented to person, place, and time.  Psychiatric:        Mood and Affect: Mood normal.        Thought Content: Thought content normal.      UC Treatments / Results  Labs (all labs ordered are listed, but only abnormal results are displayed) Labs Reviewed - No data to display  EKG   Radiology No results found.  Procedures Procedures (including critical care time)  Medications Ordered in UC Medications  ipratropium-albuterol (DUONEB) 0.5-2.5 (3) MG/3ML nebulizer solution 3 mL (3 mLs Nebulization Given 07/16/22 1007)  methylPREDNISolone sodium succinate (SOLU-MEDROL) 125 mg/2 mL injection 80 mg (80 mg Intramuscular Given 07/16/22 1037)    Initial Impression / Assessment and Plan /  UC Course  I have reviewed the triage vital signs and the nursing notes.  Pertinent labs & imaging results that were available during my care of the patient were reviewed by me and considered in my medical decision making (see chart for details).     Overall vital signs and exam reassuring and she appears in no acute distress, breathing comfortably on room air, speaking in full sentences.  On exam she has significant wheezes bilaterally and coughing fits with deep breaths.  Moderate improvement after DuoNeb treatment in clinic, continue remainder of doxycycline, already completed prednisone.  Will give IM Solu-Medrol today as this is helped her tremendously in the past, discussed doing home DuoNebs every 4 hours or so  consistently and continue home inhaler regimen.  We will also send Mucinex to help with productivity of cough.  Return for any worsening symptoms.  Final Clinical Impressions(s) / UC Diagnoses   Final diagnoses:  Viral URI with cough  COPD exacerbation [facility])   Discharge Instructions   None    ED Prescriptions     Medication Sig Dispense Auth. Provider   predniSONE (DELTASONE) 20 MG tablet Take 2 tablets (40 mg total) by mouth daily with breakfast. 10 tablet Volney American, PA-C   guaiFENesin (MUCINEX) 600 MG 12 hr tablet Take 1 tablet (600 mg total) by mouth 2 (two) times daily. 20 tablet Volney American, [place]      PDMP not reviewed this encounter.   Volney American, [place] 07/16/22 1057

## 2022-07-18 DIAGNOSIS — J449 Chronic obstructive pulmonary disease, unspecified: Secondary | ICD-10-CM | POA: Diagnosis not present

## 2022-07-24 DIAGNOSIS — D518 Other vitamin B12 deficiency anemias: Secondary | ICD-10-CM | POA: Diagnosis not present

## 2022-08-03 DIAGNOSIS — E1122 Type 2 diabetes mellitus with diabetic chronic kidney disease: Secondary | ICD-10-CM | POA: Diagnosis not present

## 2022-08-03 DIAGNOSIS — I129 Hypertensive chronic kidney disease with stage 1 through stage 4 chronic kidney disease, or unspecified chronic kidney disease: Secondary | ICD-10-CM | POA: Diagnosis not present

## 2022-08-03 DIAGNOSIS — R808 Other proteinuria: Secondary | ICD-10-CM | POA: Diagnosis not present

## 2022-08-03 DIAGNOSIS — N19 Unspecified kidney failure: Secondary | ICD-10-CM | POA: Diagnosis not present

## 2022-08-03 DIAGNOSIS — N189 Chronic kidney disease, unspecified: Secondary | ICD-10-CM | POA: Diagnosis not present

## 2022-08-03 DIAGNOSIS — E870 Hyperosmolality and hypernatremia: Secondary | ICD-10-CM | POA: Diagnosis not present

## 2022-08-08 DIAGNOSIS — Z515 Encounter for palliative care: Secondary | ICD-10-CM | POA: Diagnosis not present

## 2022-08-08 DIAGNOSIS — N184 Chronic kidney disease, stage 4 (severe): Secondary | ICD-10-CM | POA: Diagnosis not present

## 2022-08-08 DIAGNOSIS — J449 Chronic obstructive pulmonary disease, unspecified: Secondary | ICD-10-CM | POA: Diagnosis not present

## 2022-08-08 DIAGNOSIS — M109 Gout, unspecified: Secondary | ICD-10-CM | POA: Diagnosis not present

## 2022-08-08 DIAGNOSIS — D692 Other nonthrombocytopenic purpura: Secondary | ICD-10-CM | POA: Diagnosis not present

## 2022-08-10 DIAGNOSIS — E870 Hyperosmolality and hypernatremia: Secondary | ICD-10-CM | POA: Diagnosis not present

## 2022-08-10 DIAGNOSIS — N170 Acute kidney failure with tubular necrosis: Secondary | ICD-10-CM | POA: Diagnosis not present

## 2022-08-10 DIAGNOSIS — I129 Hypertensive chronic kidney disease with stage 1 through stage 4 chronic kidney disease, or unspecified chronic kidney disease: Secondary | ICD-10-CM | POA: Diagnosis not present

## 2022-08-10 DIAGNOSIS — E1122 Type 2 diabetes mellitus with diabetic chronic kidney disease: Secondary | ICD-10-CM | POA: Diagnosis not present

## 2022-08-10 DIAGNOSIS — D638 Anemia in other chronic diseases classified elsewhere: Secondary | ICD-10-CM | POA: Diagnosis not present

## 2022-08-10 DIAGNOSIS — N189 Chronic kidney disease, unspecified: Secondary | ICD-10-CM | POA: Diagnosis not present

## 2022-08-10 DIAGNOSIS — R808 Other proteinuria: Secondary | ICD-10-CM | POA: Diagnosis not present

## 2022-08-24 DIAGNOSIS — Z6827 Body mass index (BMI) 27.0-27.9, adult: Secondary | ICD-10-CM | POA: Diagnosis not present

## 2022-08-24 DIAGNOSIS — M12811 Other specific arthropathies, not elsewhere classified, right shoulder: Secondary | ICD-10-CM | POA: Diagnosis not present

## 2022-08-24 DIAGNOSIS — E663 Overweight: Secondary | ICD-10-CM | POA: Diagnosis not present

## 2022-09-06 DIAGNOSIS — U071 COVID-19: Secondary | ICD-10-CM | POA: Diagnosis not present

## 2022-09-18 DIAGNOSIS — E1122 Type 2 diabetes mellitus with diabetic chronic kidney disease: Secondary | ICD-10-CM | POA: Diagnosis not present

## 2022-09-18 DIAGNOSIS — N189 Chronic kidney disease, unspecified: Secondary | ICD-10-CM | POA: Diagnosis not present

## 2022-09-18 DIAGNOSIS — R808 Other proteinuria: Secondary | ICD-10-CM | POA: Diagnosis not present

## 2022-09-18 DIAGNOSIS — N170 Acute kidney failure with tubular necrosis: Secondary | ICD-10-CM | POA: Diagnosis not present

## 2022-09-18 DIAGNOSIS — E870 Hyperosmolality and hypernatremia: Secondary | ICD-10-CM | POA: Diagnosis not present

## 2022-09-18 DIAGNOSIS — D638 Anemia in other chronic diseases classified elsewhere: Secondary | ICD-10-CM | POA: Diagnosis not present

## 2022-09-18 DIAGNOSIS — I129 Hypertensive chronic kidney disease with stage 1 through stage 4 chronic kidney disease, or unspecified chronic kidney disease: Secondary | ICD-10-CM | POA: Diagnosis not present

## 2022-09-19 DIAGNOSIS — N1831 Chronic kidney disease, stage 3a: Secondary | ICD-10-CM | POA: Diagnosis not present

## 2022-09-19 DIAGNOSIS — M1991 Primary osteoarthritis, unspecified site: Secondary | ICD-10-CM | POA: Diagnosis not present

## 2022-09-19 DIAGNOSIS — E1140 Type 2 diabetes mellitus with diabetic neuropathy, unspecified: Secondary | ICD-10-CM | POA: Diagnosis not present

## 2022-09-19 DIAGNOSIS — J0100 Acute maxillary sinusitis, unspecified: Secondary | ICD-10-CM | POA: Diagnosis not present

## 2022-09-19 DIAGNOSIS — Z6825 Body mass index (BMI) 25.0-25.9, adult: Secondary | ICD-10-CM | POA: Diagnosis not present

## 2022-09-19 DIAGNOSIS — I10 Essential (primary) hypertension: Secondary | ICD-10-CM | POA: Diagnosis not present

## 2022-09-19 DIAGNOSIS — J209 Acute bronchitis, unspecified: Secondary | ICD-10-CM | POA: Diagnosis not present

## 2022-09-19 DIAGNOSIS — Z0001 Encounter for general adult medical examination with abnormal findings: Secondary | ICD-10-CM | POA: Diagnosis not present

## 2022-09-19 DIAGNOSIS — J449 Chronic obstructive pulmonary disease, unspecified: Secondary | ICD-10-CM | POA: Diagnosis not present

## 2022-09-19 DIAGNOSIS — Z1331 Encounter for screening for depression: Secondary | ICD-10-CM | POA: Diagnosis not present

## 2022-09-19 DIAGNOSIS — E1165 Type 2 diabetes mellitus with hyperglycemia: Secondary | ICD-10-CM | POA: Diagnosis not present

## 2022-09-19 DIAGNOSIS — E1129 Type 2 diabetes mellitus with other diabetic kidney complication: Secondary | ICD-10-CM | POA: Diagnosis not present

## 2022-09-22 DIAGNOSIS — D638 Anemia in other chronic diseases classified elsewhere: Secondary | ICD-10-CM | POA: Diagnosis not present

## 2022-09-22 DIAGNOSIS — R808 Other proteinuria: Secondary | ICD-10-CM | POA: Diagnosis not present

## 2022-09-22 DIAGNOSIS — D518 Other vitamin B12 deficiency anemias: Secondary | ICD-10-CM | POA: Diagnosis not present

## 2022-09-22 DIAGNOSIS — E1122 Type 2 diabetes mellitus with diabetic chronic kidney disease: Secondary | ICD-10-CM | POA: Diagnosis not present

## 2022-09-22 DIAGNOSIS — I129 Hypertensive chronic kidney disease with stage 1 through stage 4 chronic kidney disease, or unspecified chronic kidney disease: Secondary | ICD-10-CM | POA: Diagnosis not present

## 2022-09-22 DIAGNOSIS — N170 Acute kidney failure with tubular necrosis: Secondary | ICD-10-CM | POA: Diagnosis not present

## 2022-09-22 DIAGNOSIS — N19 Unspecified kidney failure: Secondary | ICD-10-CM | POA: Diagnosis not present

## 2022-09-22 DIAGNOSIS — N189 Chronic kidney disease, unspecified: Secondary | ICD-10-CM | POA: Diagnosis not present

## 2022-10-06 DIAGNOSIS — G894 Chronic pain syndrome: Secondary | ICD-10-CM | POA: Diagnosis not present

## 2022-10-06 DIAGNOSIS — M1991 Primary osteoarthritis, unspecified site: Secondary | ICD-10-CM | POA: Diagnosis not present

## 2022-10-23 DIAGNOSIS — E1122 Type 2 diabetes mellitus with diabetic chronic kidney disease: Secondary | ICD-10-CM | POA: Diagnosis not present

## 2022-10-23 DIAGNOSIS — N170 Acute kidney failure with tubular necrosis: Secondary | ICD-10-CM | POA: Diagnosis not present

## 2022-10-23 DIAGNOSIS — R808 Other proteinuria: Secondary | ICD-10-CM | POA: Diagnosis not present

## 2022-10-23 DIAGNOSIS — D638 Anemia in other chronic diseases classified elsewhere: Secondary | ICD-10-CM | POA: Diagnosis not present

## 2022-10-23 DIAGNOSIS — I129 Hypertensive chronic kidney disease with stage 1 through stage 4 chronic kidney disease, or unspecified chronic kidney disease: Secondary | ICD-10-CM | POA: Diagnosis not present

## 2022-10-23 DIAGNOSIS — N189 Chronic kidney disease, unspecified: Secondary | ICD-10-CM | POA: Diagnosis not present

## 2022-10-23 DIAGNOSIS — N19 Unspecified kidney failure: Secondary | ICD-10-CM | POA: Diagnosis not present

## 2022-10-26 ENCOUNTER — Encounter: Payer: Self-pay | Admitting: Radiology

## 2022-10-30 DIAGNOSIS — R808 Other proteinuria: Secondary | ICD-10-CM | POA: Diagnosis not present

## 2022-10-30 DIAGNOSIS — D638 Anemia in other chronic diseases classified elsewhere: Secondary | ICD-10-CM | POA: Diagnosis not present

## 2022-10-30 DIAGNOSIS — I129 Hypertensive chronic kidney disease with stage 1 through stage 4 chronic kidney disease, or unspecified chronic kidney disease: Secondary | ICD-10-CM | POA: Diagnosis not present

## 2022-10-30 DIAGNOSIS — N19 Unspecified kidney failure: Secondary | ICD-10-CM | POA: Diagnosis not present

## 2022-10-30 DIAGNOSIS — N170 Acute kidney failure with tubular necrosis: Secondary | ICD-10-CM | POA: Diagnosis not present

## 2022-10-30 DIAGNOSIS — N189 Chronic kidney disease, unspecified: Secondary | ICD-10-CM | POA: Diagnosis not present

## 2022-10-30 DIAGNOSIS — E1122 Type 2 diabetes mellitus with diabetic chronic kidney disease: Secondary | ICD-10-CM | POA: Diagnosis not present

## 2022-11-08 DIAGNOSIS — E538 Deficiency of other specified B group vitamins: Secondary | ICD-10-CM | POA: Diagnosis not present

## 2022-11-14 DIAGNOSIS — E1122 Type 2 diabetes mellitus with diabetic chronic kidney disease: Secondary | ICD-10-CM | POA: Insufficient documentation

## 2022-11-14 DIAGNOSIS — I129 Hypertensive chronic kidney disease with stage 1 through stage 4 chronic kidney disease, or unspecified chronic kidney disease: Secondary | ICD-10-CM | POA: Insufficient documentation

## 2022-11-14 DIAGNOSIS — N170 Acute kidney failure with tubular necrosis: Secondary | ICD-10-CM | POA: Insufficient documentation

## 2022-11-14 DIAGNOSIS — D638 Anemia in other chronic diseases classified elsewhere: Secondary | ICD-10-CM | POA: Insufficient documentation

## 2022-11-15 ENCOUNTER — Encounter: Payer: Self-pay | Admitting: Vascular Surgery

## 2022-11-15 ENCOUNTER — Ambulatory Visit (INDEPENDENT_AMBULATORY_CARE_PROVIDER_SITE_OTHER): Payer: PPO | Admitting: Vascular Surgery

## 2022-11-15 VITALS — BP 130/73 | HR 82 | Temp 97.5°F | Ht <= 58 in | Wt 133.8 lb

## 2022-11-15 DIAGNOSIS — N170 Acute kidney failure with tubular necrosis: Secondary | ICD-10-CM

## 2022-11-15 DIAGNOSIS — N184 Chronic kidney disease, stage 4 (severe): Secondary | ICD-10-CM

## 2022-11-15 DIAGNOSIS — E1122 Type 2 diabetes mellitus with diabetic chronic kidney disease: Secondary | ICD-10-CM

## 2022-11-15 DIAGNOSIS — D638 Anemia in other chronic diseases classified elsewhere: Secondary | ICD-10-CM

## 2022-11-15 DIAGNOSIS — I120 Hypertensive chronic kidney disease with stage 5 chronic kidney disease or end stage renal disease: Secondary | ICD-10-CM

## 2022-11-15 NOTE — Progress Notes (Signed)
[facility] [place]  Patient name: Lindsay Cortez MRN: [id number] DOB: 07-28-1941 Sex: female  REASON FOR CONSULT: Discuss access for hemodialysis  HPI: Lindsay Cortez is a 82 y.o. female, who is here today for discussion of hemodialysis access.  She is here today with her sister.  Dr. Theador Hawthorne as her nephrologist.  Has had progressive renal insufficiency.  Related to diabetes and hypertension.  He is right-handed.  She does not have a pacemaker.  She is not on anticoagulant.  Past Medical History:  Diagnosis Date   Arthritis    Asthma    Back pain    COPD (chronic obstructive pulmonary disease) (HCC)    Diabetes ([place]) 09/01/2014   Diabetes mellitus    x 15 yrs   Gout    Hypertension    Kidney stones    Pancreatic cyst    Pneumonia    Renal disorder    Renal insufficiency    Shoulder dislocation    Vertigo     Family History  Problem Relation Age of Onset   Breast cancer Mother    Emphysema Father    COPD Sister    Atrial fibrillation Sister    Obesity Brother    Cirrhosis Daughter    COPD Son    Diabetes Brother    Asthma Brother     SOCIAL HISTORY: Social History   Socioeconomic History   Marital status: Widowed    Spouse name: Not on file   Number of children: 2   Years of education: Not on file   Highest education level: Not on file  Occupational History    Employer: RETIRED  Tobacco Use   Smoking status: Never   Smokeless tobacco: Never  Vaping Use   Vaping Use: Never used  Substance and Sexual Activity   Alcohol use: No    Alcohol/week: 0.0 standard drinks of alcohol   Drug use: No   Sexual activity: Not Currently    Birth control/protection: Surgical  Other Topics Concern   Not on file  Social History Narrative   Not on file   Social Determinants of Health   Financial Resource Strain: Not on file  Food Insecurity: Not on file  Transportation Needs: Not on file  Physical  Activity: Not on file  Stress: Not on file  Social Connections: Not on file  Intimate Partner Violence: Not on file    Allergies  Allergen Reactions   Fentanyl Nausea And Vomiting   Codeine     Narcotic pain medicine makes her nauseated.  Says was told to only take tylenol due to kidney disease.   Levaquin [Levofloxacin In D5w] Nausea And Vomiting    Current Outpatient Medications  Medication Sig Dispense Refill   acetaminophen (TYLENOL) 500 MG tablet Take 1,000 mg by mouth daily as needed (pain). For pain     albuterol (PROVENTIL) (2.5 MG/3ML) 0.083% nebulizer solution Take 3 mLs (2.5 mg total) by nebulization every 4 (four) hours as needed for wheezing. 75 mL 1   albuterol (VENTOLIN HFA) 108 (90 BASE) MCG/ACT inhaler Inhale 2 puffs into the lungs every 4 (four) hours as needed for wheezing or shortness of breath. For rescue 1 Inhaler 1   alendronate (FOSAMAX) 70 MG tablet Take 70 mg by mouth once a week.     allopurinol (ZYLOPRIM) 100 MG tablet Take 200 mg by mouth daily.     amLODipine (NORVASC) 5 MG tablet Take 5 mg by mouth daily.  budesonide-formoterol (SYMBICORT) 80-4.5 MCG/ACT inhaler Inhale 2 puffs into the lungs 2 (two) times daily.     calcium acetate (PHOSLO) 667 MG tablet Take 667 mg by mouth.     Calcium Carbonate-Vitamin D (CALCIUM 600 + D PO) Take 1 tablet by mouth daily.     Cholecalciferol (VITAMIN D) 2000 UNITS CAPS Take 1 capsule by mouth every evening.     ferrous sulfate 325 (65 FE) MG EC tablet Take 1 tablet by mouth every morning.     furosemide (LASIX) 20 MG tablet Take 20 mg by mouth.     glimepiride (AMARYL) 1 MG tablet Take 1 mg by mouth.     ipratropium-albuterol (DUONEB) 0.5-2.5 (3) MG/3ML SOLN Take 3 mLs by nebulization every 4 (four) hours as needed. 360 mL 0   levothyroxine (SYNTHROID) 100 MCG tablet Take 100 mcg by mouth daily.     lisinopril (ZESTRIL) 10 MG tablet Take 10 mg by mouth daily.     loperamide (IMODIUM) 2 MG capsule Take 2 mg by mouth  every morning.     meclizine (ANTIVERT) 25 MG tablet Take 1 tablet (25 mg total) by mouth 3 (three) times daily as needed for dizziness. 30 tablet 0   metoprolol succinate (TOPROL-XL) 50 MG 24 hr tablet Take 50 mg by mouth daily.     promethazine (PHENERGAN) 25 MG suppository Place 1 suppository (25 mg total) rectally every 6 (six) hours as needed for nausea or vomiting. 12 each 0   simvastatin (ZOCOR) 10 MG tablet Take 10 mg by mouth at bedtime.     sodium bicarbonate 650 MG tablet Take by mouth 3 (three) times daily.     No current facility-administered medications for this visit.    REVIEW OF SYSTEMS:  [X]  denotes positive finding, [ ]  denotes negative finding Cardiac  Comments:  Chest pain or chest pressure:    Shortness of breath upon exertion:    Short of breath when lying flat:    Irregular heart rhythm:        Vascular    Pain in calf, thigh, or hip brought on by ambulation:    Pain in feet at night that wakes you up from your sleep:     Blood clot in your veins:    Leg swelling:         Pulmonary    Oxygen at home:    Productive cough:     Wheezing:         Neurologic    Sudden weakness in arms or legs:     Sudden numbness in arms or legs:     Sudden onset of difficulty speaking or slurred speech:    Temporary loss of vision in one eye:     Problems with dizziness:         Gastrointestinal    Blood in stool:     Vomited blood:         Genitourinary    Burning when urinating:     Blood in urine:        Psychiatric    Major depression:         Hematologic    Bleeding problems:    Problems with blood clotting too easily:        Skin    Rashes or ulcers:        Constitutional    Fever or chills:      PHYSICAL EXAM: Vitals:   11/15/22 1409  BP: 130/73  Pulse: 82  Temp: (!) 97.5 F (36.4 C)  SpO2: 95%  Weight: 133 lb 12.8 oz (60.7 kg)  Height: 4\' 7"  (1.397 m)    GENERAL: The patient is a well-nourished female, in no acute distress. The vital  signs are documented above. CARDIOVASCULAR: 2+ radial pulses bilaterally.  Moderate cephalic vein in her forearm bilaterally.  Moderate to large antecubital vein bilaterally. PULMONARY: There is good air exchange  MUSCULOSKELETAL: There are no major deformities or cyanosis. NEUROLOGIC: No focal weakness or paresthesias are detected. SKIN: There are no ulcers or rashes noted. PSYCHIATRIC: The patient has a normal affect.  DATA:  Immature veins with SonoSite ultrasound.  She does have good caliber cephalic vein at the antecubital space bilaterally and large caliber in the upper arm bilaterally.  MEDICAL ISSUES: Lung discussed with the patient and her sister regarding options for hemodialysis access.  I discussed the catheter for acute need and AV graft and AV fistula.  Discussed the potential complications of all of these.  She does appear to be an excellent candidate for left upper arm AV fistula.  Explained that it would require a minimum of 3 months for maturation of the longer the fistula was patent at the better it would become for dialysis.  She wishes to proceed with access placement.  We have tentatively scheduled this for 11/21/2022 at [facility].   Rosetta Posner, MD [facility] [phone] Pager [phone]  Note: Portions of this report may have been transcribed using voice recognition software.  Every effort has been made to ensure accuracy; however, inadvertent computerized transcription errors may still be present.

## 2022-11-15 NOTE — H&P (View-Only) (Signed)
[facility]ist of [place]  Patient name: Lindsay Cortez MRN: [id number] DOB: Oct 10, 1940 Sex: female  REASON FOR CONSULT: Discuss access for hemodialysis  HPI: Lindsay Cortez is a 82 y.o. female, who is here today for discussion of hemodialysis access.  She is here today with her sister.  Dr. Theador Hawthorne as her nephrologist.  Has had progressive renal insufficiency.  Related to diabetes and hypertension.  He is right-handed.  She does not have a pacemaker.  She is not on anticoagulant.  Past Medical History:  Diagnosis Date   Arthritis    Asthma    Back pain    COPD (chronic obstructive pulmonary disease) (HCC)    Diabetes ([place]) 09/01/2014   Diabetes mellitus    x 15 yrs   Gout    Hypertension    Kidney stones    Pancreatic cyst    Pneumonia    Renal disorder    Renal insufficiency    Shoulder dislocation    Vertigo     Family History  Problem Relation Age of Onset   Breast cancer Mother    Emphysema Father    COPD Sister    Atrial fibrillation Sister    Obesity Brother    Cirrhosis Daughter    COPD Son    Diabetes Brother    Asthma Brother     SOCIAL HISTORY: Social History   Socioeconomic History   Marital status: Widowed    Spouse name: Not on file   Number of children: 2   Years of education: Not on file   Highest education level: Not on file  Occupational History    Employer: RETIRED  Tobacco Use   Smoking status: Never   Smokeless tobacco: Never  Vaping Use   Vaping Use: Never used  Substance and Sexual Activity   Alcohol use: No    Alcohol/week: 0.0 standard drinks of alcohol   Drug use: No   Sexual activity: Not Currently    Birth control/protection: Surgical  Other Topics Concern   Not on file  Social History Narrative   Not on file   Social Determinants of Health   Financial Resource Strain: Not on file  Food Insecurity: Not on file  Transportation Needs: Not on file  Physical  Activity: Not on file  Stress: Not on file  Social Connections: Not on file  Intimate Partner Violence: Not on file    Allergies  Allergen Reactions   Fentanyl Nausea And Vomiting   Codeine     Narcotic pain medicine makes her nauseated.  Says was told to only take tylenol due to kidney disease.   Levaquin [Levofloxacin In D5w] Nausea And Vomiting    Current Outpatient Medications  Medication Sig Dispense Refill   acetaminophen (TYLENOL) 500 MG tablet Take 1,000 mg by mouth daily as needed (pain). For pain     albuterol (PROVENTIL) (2.5 MG/3ML) 0.083% nebulizer solution Take 3 mLs (2.5 mg total) by nebulization every 4 (four) hours as needed for wheezing. 75 mL 1   albuterol (VENTOLIN HFA) 108 (90 BASE) MCG/ACT inhaler Inhale 2 puffs into the lungs every 4 (four) hours as needed for wheezing or shortness of breath. For rescue 1 Inhaler 1   alendronate (FOSAMAX) 70 MG tablet Take 70 mg by mouth once a week.     allopurinol (ZYLOPRIM) 100 MG tablet Take 200 mg by mouth daily.     amLODipine (NORVASC) 5 MG tablet Take 5 mg by mouth daily.  budesonide-formoterol (SYMBICORT) 80-4.5 MCG/ACT inhaler Inhale 2 puffs into the lungs 2 (two) times daily.     calcium acetate (PHOSLO) 667 MG tablet Take 667 mg by mouth.     Calcium Carbonate-Vitamin D (CALCIUM 600 + D PO) Take 1 tablet by mouth daily.     Cholecalciferol (VITAMIN D) 2000 UNITS CAPS Take 1 capsule by mouth every evening.     ferrous sulfate 325 (65 FE) MG EC tablet Take 1 tablet by mouth every morning.     furosemide (LASIX) 20 MG tablet Take 20 mg by mouth.     glimepiride (AMARYL) 1 MG tablet Take 1 mg by mouth.     ipratropium-albuterol (DUONEB) 0.5-2.5 (3) MG/3ML SOLN Take 3 mLs by nebulization every 4 (four) hours as needed. 360 mL 0   levothyroxine (SYNTHROID) 100 MCG tablet Take 100 mcg by mouth daily.     lisinopril (ZESTRIL) 10 MG tablet Take 10 mg by mouth daily.     loperamide (IMODIUM) 2 MG capsule Take 2 mg by mouth  every morning.     meclizine (ANTIVERT) 25 MG tablet Take 1 tablet (25 mg total) by mouth 3 (three) times daily as needed for dizziness. 30 tablet 0   metoprolol succinate (TOPROL-XL) 50 MG 24 hr tablet Take 50 mg by mouth daily.     promethazine (PHENERGAN) 25 MG suppository Place 1 suppository (25 mg total) rectally every 6 (six) hours as needed for nausea or vomiting. 12 each 0   simvastatin (ZOCOR) 10 MG tablet Take 10 mg by mouth at bedtime.     sodium bicarbonate 650 MG tablet Take by mouth 3 (three) times daily.     No current facility-administered medications for this visit.    REVIEW OF SYSTEMS:  [X]  denotes positive finding, [ ]  denotes negative finding Cardiac  Comments:  Chest pain or chest pressure:    Shortness of breath upon exertion:    Short of breath when lying flat:    Irregular heart rhythm:        Vascular    Pain in calf, thigh, or hip brought on by ambulation:    Pain in feet at night that wakes you up from your sleep:     Blood clot in your veins:    Leg swelling:         Pulmonary    Oxygen at home:    Productive cough:     Wheezing:         Neurologic    Sudden weakness in arms or legs:     Sudden numbness in arms or legs:     Sudden onset of difficulty speaking or slurred speech:    Temporary loss of vision in one eye:     Problems with dizziness:         Gastrointestinal    Blood in stool:     Vomited blood:         Genitourinary    Burning when urinating:     Blood in urine:        Psychiatric    Major depression:         Hematologic    Bleeding problems:    Problems with blood clotting too easily:        Skin    Rashes or ulcers:        Constitutional    Fever or chills:      PHYSICAL EXAM: Vitals:   11/15/22 1409  BP: 130/73  Pulse: 82  Temp: (!) 97.5 F (36.4 C)  SpO2: 95%  Weight: 133 lb 12.8 oz (60.7 kg)  Height: 4\' 7"  (1.397 m)    GENERAL: The patient is a well-nourished female, in no acute distress. The vital  signs are documented above. CARDIOVASCULAR: 2+ radial pulses bilaterally.  Moderate cephalic vein in her forearm bilaterally.  Moderate to large antecubital vein bilaterally. PULMONARY: There is good air exchange  MUSCULOSKELETAL: There are no major deformities or cyanosis. NEUROLOGIC: No focal weakness or paresthesias are detected. SKIN: There are no ulcers or rashes noted. PSYCHIATRIC: The patient has a normal affect.  DATA:  Immature veins with SonoSite ultrasound.  She does have good caliber cephalic vein at the antecubital space bilaterally and large caliber in the upper arm bilaterally.  MEDICAL ISSUES: Lung discussed with the patient and her sister regarding options for hemodialysis access.  I discussed the catheter for acute need and AV graft and AV fistula.  Discussed the potential complications of all of these.  She does appear to be an excellent candidate for left upper arm AV fistula.  Explained that it would require a minimum of 3 months for maturation of the longer the fistula was patent at the better it would become for dialysis.  She wishes to proceed with access placement.  We have tentatively scheduled this for 11/21/2022 at [facility].   Rosetta Posner, MD [facility] Special[facility] [phone] Pager [phone]  Note: Portions of this report may have been transcribed using voice recognition software.  Every effort has been made to ensure accuracy; however, inadvertent computerized transcription errors may still be present.

## 2022-11-16 ENCOUNTER — Other Ambulatory Visit: Payer: Self-pay

## 2022-11-16 ENCOUNTER — Telehealth: Payer: Self-pay

## 2022-11-16 DIAGNOSIS — I120 Hypertensive chronic kidney disease with stage 5 chronic kidney disease or end stage renal disease: Secondary | ICD-10-CM

## 2022-11-16 NOTE — Telephone Encounter (Signed)
Patient scheduled for left arm AVF with Dr. Donnetta Hutching at [facility] on March 26. Advised we will not be able to draw the labs needed from Dr. Theador Hawthorne and PCP as previously discussed. Patient's sister, Delcie Roch stated she will just have labs drawn another day but desired to proceed with surgery as scheduled. Instructions provided and understanding verbalized.

## 2022-11-20 ENCOUNTER — Encounter (HOSPITAL_COMMUNITY): Payer: Self-pay

## 2022-11-20 ENCOUNTER — Encounter (HOSPITAL_COMMUNITY)
Admission: RE | Admit: 2022-11-20 | Discharge: 2022-11-20 | Disposition: A | Discharge status: Home / Self Care | Payer: PPO | Source: Ambulatory Visit | Attending: Vascular Surgery | Admitting: Vascular Surgery

## 2022-11-20 ENCOUNTER — Other Ambulatory Visit: Payer: Self-pay

## 2022-11-20 VITALS — Ht <= 58 in | Wt 133.8 lb

## 2022-11-20 DIAGNOSIS — E1122 Type 2 diabetes mellitus with diabetic chronic kidney disease: Secondary | ICD-10-CM | POA: Diagnosis not present

## 2022-11-20 DIAGNOSIS — N170 Acute kidney failure with tubular necrosis: Secondary | ICD-10-CM | POA: Diagnosis not present

## 2022-11-20 DIAGNOSIS — N189 Chronic kidney disease, unspecified: Secondary | ICD-10-CM | POA: Diagnosis not present

## 2022-11-20 DIAGNOSIS — I129 Hypertensive chronic kidney disease with stage 1 through stage 4 chronic kidney disease, or unspecified chronic kidney disease: Secondary | ICD-10-CM | POA: Diagnosis not present

## 2022-11-20 DIAGNOSIS — D638 Anemia in other chronic diseases classified elsewhere: Secondary | ICD-10-CM

## 2022-11-20 DIAGNOSIS — I10 Essential (primary) hypertension: Secondary | ICD-10-CM

## 2022-11-20 DIAGNOSIS — R808 Other proteinuria: Secondary | ICD-10-CM | POA: Diagnosis not present

## 2022-11-20 DIAGNOSIS — N19 Unspecified kidney failure: Secondary | ICD-10-CM | POA: Diagnosis not present

## 2022-11-21 ENCOUNTER — Other Ambulatory Visit: Payer: Self-pay

## 2022-11-21 ENCOUNTER — Encounter (HOSPITAL_COMMUNITY)
Admission: RE | Disposition: A | Discharge status: Home / Self Care | Payer: Self-pay | Source: Ambulatory Visit | Attending: Vascular Surgery

## 2022-11-21 ENCOUNTER — Ambulatory Visit (HOSPITAL_COMMUNITY)
Admission: RE | Admit: 2022-11-21 | Discharge: 2022-11-21 | Disposition: A | Discharge status: Home / Self Care | Payer: PPO | Source: Ambulatory Visit | Attending: Vascular Surgery | Admitting: Vascular Surgery

## 2022-11-21 ENCOUNTER — Ambulatory Visit (HOSPITAL_COMMUNITY): Payer: PPO | Admitting: Anesthesiology

## 2022-11-21 ENCOUNTER — Encounter (HOSPITAL_COMMUNITY): Payer: Self-pay | Admitting: Vascular Surgery

## 2022-11-21 ENCOUNTER — Ambulatory Visit (HOSPITAL_BASED_OUTPATIENT_CLINIC_OR_DEPARTMENT_OTHER): Payer: PPO | Admitting: Anesthesiology

## 2022-11-21 DIAGNOSIS — I120 Hypertensive chronic kidney disease with stage 5 chronic kidney disease or end stage renal disease: Secondary | ICD-10-CM

## 2022-11-21 DIAGNOSIS — D638 Anemia in other chronic diseases classified elsewhere: Secondary | ICD-10-CM

## 2022-11-21 DIAGNOSIS — N184 Chronic kidney disease, stage 4 (severe): Secondary | ICD-10-CM

## 2022-11-21 DIAGNOSIS — J449 Chronic obstructive pulmonary disease, unspecified: Secondary | ICD-10-CM | POA: Insufficient documentation

## 2022-11-21 DIAGNOSIS — N185 Chronic kidney disease, stage 5: Secondary | ICD-10-CM

## 2022-11-21 DIAGNOSIS — Z833 Family history of diabetes mellitus: Secondary | ICD-10-CM | POA: Diagnosis not present

## 2022-11-21 DIAGNOSIS — I129 Hypertensive chronic kidney disease with stage 1 through stage 4 chronic kidney disease, or unspecified chronic kidney disease: Secondary | ICD-10-CM | POA: Insufficient documentation

## 2022-11-21 DIAGNOSIS — I10 Essential (primary) hypertension: Secondary | ICD-10-CM

## 2022-11-21 DIAGNOSIS — D631 Anemia in chronic kidney disease: Secondary | ICD-10-CM

## 2022-11-21 DIAGNOSIS — Z7984 Long term (current) use of oral hypoglycemic drugs: Secondary | ICD-10-CM | POA: Diagnosis not present

## 2022-11-21 DIAGNOSIS — E1122 Type 2 diabetes mellitus with diabetic chronic kidney disease: Secondary | ICD-10-CM | POA: Insufficient documentation

## 2022-11-21 DIAGNOSIS — N186 End stage renal disease: Secondary | ICD-10-CM

## 2022-11-21 DIAGNOSIS — Z8249 Family history of ischemic heart disease and other diseases of the circulatory system: Secondary | ICD-10-CM | POA: Diagnosis not present

## 2022-11-21 HISTORY — PX: AV FISTULA PLACEMENT: SHX1204

## 2022-11-21 LAB — BASIC METABOLIC PANEL
Anion gap: 13 (ref 5–15)
BUN: 45 mg/dL — ABNORMAL HIGH (ref 8–23)
CO2: 21 mmol/L — ABNORMAL LOW (ref 22–32)
Calcium: 8.8 mg/dL — ABNORMAL LOW (ref 8.9–10.3)
Chloride: 106 mmol/L (ref 98–111)
Creatinine, Ser: 3.57 mg/dL — ABNORMAL HIGH (ref 0.44–1.00)
GFR, Estimated: 12 mL/min — ABNORMAL LOW (ref >60)
Glucose, Bld: 118 mg/dL — ABNORMAL HIGH (ref 70–99)
Potassium: 3.5 mmol/L (ref 3.5–5.1)
Sodium: 140 mmol/L (ref 135–145)

## 2022-11-21 LAB — CBC
HCT: 34.8 % — ABNORMAL LOW (ref 36.0–46.0)
Hemoglobin: 11.4 g/dL — ABNORMAL LOW (ref 12.0–15.0)
MCH: 31.1 pg (ref 26.0–34.0)
MCHC: 32.8 g/dL (ref 30.0–36.0)
MCV: 95.1 fL (ref 80.0–100.0)
Platelets: 253 10*3/uL (ref 150–400)
RBC: 3.66 MIL/uL — ABNORMAL LOW (ref 3.87–5.11)
RDW: 14.8 % (ref 11.5–15.5)
WBC: 11.4 10*3/uL — ABNORMAL HIGH (ref 4.0–10.5)
nRBC: 0 % (ref 0.0–0.2)

## 2022-11-21 LAB — GLUCOSE, CAPILLARY: Glucose-Capillary: 125 mg/dL — ABNORMAL HIGH (ref 70–99)

## 2022-11-21 SURGERY — ARTERIOVENOUS (AV) FISTULA CREATION
Anesthesia: General | Site: Arm Lower | Laterality: Left

## 2022-11-21 MED ORDER — CHLORHEXIDINE GLUCONATE 4 % EX LIQD: 60.0000 mL | Freq: Once | CUTANEOUS | Status: DC

## 2022-11-21 MED ORDER — ONDANSETRON HCL 4 MG/2ML IJ SOLN: 4.0000 mg | Freq: Once | INTRAMUSCULAR | Status: DC | PRN

## 2022-11-21 MED ORDER — LIDOCAINE HCL (PF) 2 % IJ SOLN
INTRAMUSCULAR | Status: AC
  Filled 2022-11-21: qty 5

## 2022-11-21 MED ORDER — ONDANSETRON HCL 4 MG/2ML IJ SOLN
INTRAMUSCULAR | Status: AC
  Filled 2022-11-21: qty 2

## 2022-11-21 MED ORDER — SODIUM CHLORIDE 0.9 % IV SOLN: INTRAVENOUS | Status: DC

## 2022-11-21 MED ORDER — CEFAZOLIN SODIUM-DEXTROSE 2-4 GM/100ML-% IV SOLN
2.0000 g | INTRAVENOUS | Status: AC
  Administered 2022-11-21: 2 g via INTRAVENOUS
  Filled 2022-11-21: qty 100

## 2022-11-21 MED ORDER — LIDOCAINE-EPINEPHRINE 0.5 %-1:200000 IJ SOLN
INTRAMUSCULAR | Status: DC | PRN
  Administered 2022-11-21: 5 mL

## 2022-11-21 MED ORDER — MIDAZOLAM HCL 5 MG/5ML IJ SOLN
INTRAMUSCULAR | Status: DC | PRN
  Administered 2022-11-21: 1 mg via INTRAVENOUS
  Administered 2022-11-21: .5 mg via INTRAVENOUS

## 2022-11-21 MED ORDER — MIDAZOLAM HCL 2 MG/2ML IJ SOLN
INTRAMUSCULAR | Status: AC
  Filled 2022-11-21: qty 2

## 2022-11-21 MED ORDER — PROPOFOL 10 MG/ML IV BOLUS
INTRAVENOUS | Status: DC | PRN
  Administered 2022-11-21: 30 mg via INTRAVENOUS

## 2022-11-21 MED ORDER — PROPOFOL 10 MG/ML IV BOLUS
INTRAVENOUS | Status: AC
  Filled 2022-11-21: qty 20

## 2022-11-21 MED ORDER — LIDOCAINE-EPINEPHRINE 0.5 %-1:200000 IJ SOLN
INTRAMUSCULAR | Status: AC
  Filled 2022-11-21: qty 50

## 2022-11-21 MED ORDER — 0.9 % SODIUM CHLORIDE (POUR BTL) OPTIME
TOPICAL | Status: DC | PRN
  Administered 2022-11-21: 1000 mL

## 2022-11-21 MED ORDER — HEPARIN SODIUM (PORCINE) 1000 UNIT/ML IJ SOLN
INTRAMUSCULAR | Status: AC
  Filled 2022-11-21: qty 6

## 2022-11-21 MED ORDER — ORAL CARE MOUTH RINSE: 15.0000 mL | Freq: Once | OROMUCOSAL | Status: AC

## 2022-11-21 MED ORDER — HYDROMORPHONE HCL 1 MG/ML IJ SOLN: 0.2500 mg | INTRAMUSCULAR | Status: DC | PRN

## 2022-11-21 MED ORDER — PROPOFOL 500 MG/50ML IV EMUL
INTRAVENOUS | Status: DC | PRN
  Administered 2022-11-21: 50 ug/kg/min via INTRAVENOUS

## 2022-11-21 MED ORDER — HEPARIN 6000 UNIT IRRIGATION SOLUTION
Status: DC | PRN
  Administered 2022-11-21: 1

## 2022-11-21 MED ORDER — CHLORHEXIDINE GLUCONATE 0.12 % MT SOLN
15.0000 mL | Freq: Once | OROMUCOSAL | Status: AC
  Administered 2022-11-21: 15 mL via OROMUCOSAL
  Filled 2022-11-21: qty 15

## 2022-11-21 SURGICAL SUPPLY — 34 items
ADH SKN CLS APL DERMABOND .7 (GAUZE/BANDAGES/DRESSINGS) ×1
ARMBAND PINK RESTRICT EXTREMIT (MISCELLANEOUS) ×1 IMPLANT
BAG HAMPER (MISCELLANEOUS) ×1 IMPLANT
CANNULA VESSEL 3MM 2 BLNT TIP (CANNULA) ×1 IMPLANT
CLIP LIGATING EXTRA MED SLVR (CLIP) ×1 IMPLANT
CLIP LIGATING EXTRA SM BLUE (MISCELLANEOUS) ×1 IMPLANT
COVER LIGHT HANDLE STERIS (MISCELLANEOUS) ×2 IMPLANT
COVER MAYO STAND XLG (MISCELLANEOUS) ×1 IMPLANT
DERMABOND ADVANCED .7 DNX12 (GAUZE/BANDAGES/DRESSINGS) ×1 IMPLANT
ELECT REM PT RETURN 9FT ADLT (ELECTROSURGICAL) ×1
ELECTRODE REM PT RTRN 9FT ADLT (ELECTROSURGICAL) ×1 IMPLANT
GAUZE SPONGE 4X4 12PLY STRL (GAUZE/BANDAGES/DRESSINGS) ×1 IMPLANT
GLOVE BIOGEL PI IND STRL 7.0 (GLOVE) ×2 IMPLANT
GLOVE SURG MICRO LTX SZ7.5 (GLOVE) ×1 IMPLANT
GOWN STRL REUS W/TWL LRG LVL3 (GOWN DISPOSABLE) ×3 IMPLANT
IV NS 500ML (IV SOLUTION) ×1
IV NS 500ML BAXH (IV SOLUTION) ×1 IMPLANT
KIT BLADEGUARD II DBL (SET/KITS/TRAYS/PACK) ×1 IMPLANT
KIT TURNOVER KIT A (KITS) ×1 IMPLANT
MANIFOLD NEPTUNE II (INSTRUMENTS) ×1 IMPLANT
MARKER SKIN DUAL TIP RULER LAB (MISCELLANEOUS) ×2 IMPLANT
NDL HYPO 18GX1.5 BLUNT FILL (NEEDLE) ×1 IMPLANT
NEEDLE HYPO 18GX1.5 BLUNT FILL (NEEDLE) ×1 IMPLANT
NS IRRIG 1000ML POUR BTL (IV SOLUTION) ×1 IMPLANT
PACK CV ACCESS (CUSTOM PROCEDURE TRAY) ×1 IMPLANT
PAD ARMBOARD 7.5X6 YLW CONV (MISCELLANEOUS) ×1 IMPLANT
SET BASIN LINEN APH (SET/KITS/TRAYS/PACK) ×1 IMPLANT
SOL PREP POV-IOD 4OZ 10% (MISCELLANEOUS) ×1 IMPLANT
SOL PREP PROV IODINE SCRUB 4OZ (MISCELLANEOUS) ×1 IMPLANT
SUT PROLENE 6 0 CC (SUTURE) ×1 IMPLANT
SUT VIC AB 3-0 SH 27 (SUTURE) ×1
SUT VIC AB 3-0 SH 27X BRD (SUTURE) ×1 IMPLANT
SYR 10ML LL (SYRINGE) ×1 IMPLANT
UNDERPAD 30X36 HEAVY ABSORB (UNDERPADS AND DIAPERS) ×1 IMPLANT

## 2022-11-21 NOTE — Progress Notes (Signed)
Latest Reference Range & Units 11/21/22 [id number]  BASIC METABOLIC PANEL  Rpt !  Sodium 135 - 145 mmol/L 140  Potassium 3.5 - 5.1 mmol/L 3.5  Chloride 98 - 111 mmol/L 106  CO2 22 - 32 mmol/L 21 (L)  Glucose 70 - 99 mg/dL 118 (H)  BUN 8 - 23 mg/dL 45 (H)  Creatinine 0.44 - 1.00 mg/dL 3.57 (H)  Calcium 8.9 - 10.3 mg/dL 8.8 (L)  Anion gap 5 - 15  13  GFR, Estimated >60 mL/min 12 (L)  WBC 4.0 - 10.5 K/uL 11.4 (H)  RBC 3.87 - 5.11 MIL/uL 3.66 (L)  Hemoglobin 12.0 - 15.0 g/dL 11.4 (L)  HCT 36.0 - 46.0 % 34.8 (L)  MCV 80.0 - 100.0 fL 95.1  MCH 26.0 - 34.0 pg 31.1  MCHC 30.0 - 36.0 g/dL 32.8  RDW 11.5 - 15.5 % 14.8  Platelets 150 - 400 K/uL 253  nRBC 0.0 - 0.2 % 0.0  !: Data is abnormal (L): Data is abnormally low (H): Data is abnormally high Rpt: View report in Results Review for more information

## 2022-11-21 NOTE — Transfer of Care (Signed)
Immediate Anesthesia Transfer of Care Note  Patient: Lindsay Cortez  Procedure(s) Performed: LEFT ARM ARTERIOVENOUS (AV) FISTULA CREATION (Left: Arm Lower)  Patient Location: PACU  Anesthesia Type:General  Level of Consciousness: awake  Airway & Oxygen Therapy: Patient Spontanous Breathing  Post-op Assessment: Report given to RN  Post vital signs: Reviewed and stable  Last Vitals:  Vitals Value Taken Time  BP 139/91 11/21/22 1130  Temp    Pulse 80 11/21/22 1133  Resp 20 11/21/22 1133  SpO2 95 % 11/21/22 1133  Vitals shown include unvalidated device data.  Last Pain:  Vitals:   11/21/22 0821  PainSc: 0-No pain         Complications: No notable events documented.

## 2022-11-21 NOTE — Discharge Instructions (Signed)
Vascular and Vein Specialists of [facility]  Discharge Instructions  AV Fistula or Graft Surgery for Dialysis Access  Please refer to the following instructions for your post-procedure care. Your surgeon or physician assistant will discuss any changes with you.  Activity  You may drive the day following your surgery, if you are comfortable and no longer taking prescription pain medication. Resume full activity as the soreness in your incision resolves.  Bathing/Showering  You may shower after you go home. Keep your incision dry for 48 hours. Do not soak in a bathtub, hot tub, or swim until the incision heals completely. You may not shower if you have a hemodialysis catheter.  Incision Care  Clean your incision with mild soap and water after 48 hours. Pat the area dry with a clean towel. You do not need a bandage unless otherwise instructed. Do not apply any ointments or creams to your incision. You may have skin glue on your incision. Do not peel it off. It will come off on its own in about one week. Your arm may swell a bit after surgery. To reduce swelling use pillows to elevate your arm so it is above your heart. Your doctor will tell you if you need to lightly wrap your arm with an ACE bandage.  Diet  Resume your normal diet. There are not special food restrictions following this procedure. In order to heal from your surgery, it is CRITICAL to get adequate nutrition. Your body requires vitamins, minerals, and protein. Vegetables are the best source of vitamins and minerals. Vegetables also provide the perfect balance of protein. Processed food has little nutritional value, so try to avoid this.  Medications  Resume taking all of your medications. If your incision is causing pain, you may take over-the counter pain relievers such as acetaminophen (Tylenol). If you were prescribed a stronger pain medication, please be aware these medications can cause nausea and constipation. Prevent  nausea by taking the medication with a snack or meal. Avoid constipation by drinking plenty of fluids and eating foods with high amount of fiber, such as fruits, vegetables, and grains.  Do not take Tylenol if you are taking prescription pain medications.  Follow up Your surgeon may want to see you in the office following your access surgery. If so, this will be arranged at the time of your surgery.  Please call us immediately for any of the following conditions:  Increased pain, redness, drainage (pus) from your incision site Fever of 101 degrees or higher Severe or worsening pain at your incision site Hand pain or numbness.  Reduce your risk of vascular disease:  Stop smoking. If you would like help, call [facility] at 1-800-QUIT-NOW 769-540-8606) or [place] at [place] your cholesterol Maintain a desired weight Control your diabetes Keep your blood pressure down  Dialysis  It will take several weeks to several months for your new dialysis access to be ready for use. Your surgeon will determine when it is okay to use it. Your nephrologist will continue to direct your dialysis. You can continue to use your Permcath until your new access is ready for use.   11/21/2022 DANAISA GUYETTE [id number] [birth year]  Surgeon(s): Ludie Pavlik, Arvilla Meres, MD  Procedure(s): LEFT ARM ARTERIOVENOUS (AV) FISTULA CREATION   May stick graft immediately   May stick graft on designated area only:    Do not stick fistula for 12 weeks    If you have any questions, please call the office at [phone].

## 2022-11-21 NOTE — Interval H&P Note (Signed)
History and Physical Interval Note:  11/21/2022 9:25 AM  Lindsay Cortez  has presented today for surgery, with the diagnosis of CKD V.  The various methods of treatment have been discussed with the patient and family. After consideration of risks, benefits and other options for treatment, the patient has consented to  Procedure(s): LEFT ARM ARTERIOVENOUS (AV) FISTULA CREATION (Left) as a surgical intervention.  The patient's history has been reviewed, patient examined, no change in status, stable for surgery.  I have reviewed the patient's chart and labs.  Questions were answered to the patient's satisfaction.     Curt Jews

## 2022-11-21 NOTE — Anesthesia Postprocedure Evaluation (Signed)
Anesthesia Post Note  Patient: Lindsay Cortez  Procedure(s) Performed: LEFT ARM ARTERIOVENOUS (AV) FISTULA CREATION (Left: Arm Lower)  Patient location during evaluation: Phase II Anesthesia Type: General Level of consciousness: awake and alert and oriented Pain management: pain level controlled Vital Signs Assessment: post-procedure vital signs reviewed and stable Respiratory status: spontaneous breathing, nonlabored ventilation and respiratory function stable Cardiovascular status: blood pressure returned to baseline and stable Postop Assessment: no apparent nausea or vomiting Anesthetic complications: no  No notable events documented.   Last Vitals:  Vitals:   11/21/22 1145 11/21/22 1207  BP: (!) 155/136 (!) 153/73  Pulse: 77 82  Resp: 20 19  Temp:  36.5 C  SpO2: 91% 96%    Last Pain:  Vitals:   11/21/22 1207  TempSrc: Oral  PainSc: 0-No pain                 Ganon Demasi C Josslin Sanjuan

## 2022-11-21 NOTE — Anesthesia Preprocedure Evaluation (Signed)
Anesthesia Evaluation  Patient identified by MRN, date of birth, ID band Patient awake    Reviewed: Allergy & Precautions, H&P , NPO status , Patient's Chart, lab work & pertinent test results, reviewed documented beta blocker date and time   History of Anesthesia Complications Negative for: history of anesthetic complications  Airway Mallampati: II  TM Distance: >3 FB Neck ROM: Full    Dental  (+) Missing, Dental Advisory Given   Pulmonary asthma , pneumonia, COPD,  COPD inhaler   Pulmonary exam normal breath sounds clear to auscultation       Cardiovascular Exercise Tolerance: Good hypertension, Pt. on medications and Pt. on home beta blockers Normal cardiovascular exam Rhythm:Regular Rate:Normal  1. Left ventricular ejection fraction, by estimation, is 65 to 70%. The  left ventricle has normal function. The left ventricle has no regional  wall motion abnormalities. Left ventricular diastolic parameters are  indeterminate.   2. Right ventricular systolic function is normal. The right ventricular  size is normal. There is mildly elevated pulmonary artery systolic  pressure.   3. Left atrial size was mildly dilated.   4. The mitral valve is degenerative. Trivial mitral valve regurgitation.  No evidence of mitral stenosis. Moderate mitral annular calcification.   5. The aortic valve was not well visualized. There is moderate  calcification of the aortic valve. There is moderate thickening of the  aortic valve. Aortic valve regurgitation is not visualized. Mild to  moderate aortic valve sclerosis/calcification is  present, without any evidence of aortic stenosis.   6. The inferior vena cava is dilated in size with >50% respiratory  variability, suggesting right atrial pressure of 8 mmHg.     Neuro/Psych negative neurological ROS  negative psych ROS   GI/Hepatic negative GI ROS, Neg liver ROS,,,  Endo/Other  diabetes, Well  Controlled, Type 2, Oral Hypoglycemic AgentsHypothyroidism    Renal/GU CRF and Renal InsufficiencyRenal disease  negative genitourinary   Musculoskeletal  (+) Arthritis , Osteoarthritis,    Abdominal   Peds negative pediatric ROS (+)  Hematology  (+) Blood dyscrasia, anemia   Anesthesia Other Findings   Reproductive/Obstetrics negative OB ROS                             Anesthesia Physical Anesthesia Plan  ASA: 3  Anesthesia Plan: General   Post-op Pain Management: Minimal or no pain anticipated   Induction: Intravenous  PONV Risk Score and Plan: 3 and Ondansetron and Propofol infusion  Airway Management Planned: Nasal Cannula, Natural Airway and Simple Face Mask  Additional Equipment:   Intra-op Plan:   Post-operative Plan:   Informed Consent: I have reviewed the patients History and Physical, chart, labs and discussed the procedure including the risks, benefits and alternatives for the proposed anesthesia with the patient or authorized representative who has indicated his/her understanding and acceptance.     Dental advisory given  Plan Discussed with: CRNA and Surgeon  Anesthesia Plan Comments:        Anesthesia Quick Evaluation

## 2022-11-21 NOTE — Op Note (Signed)
    OPERATIVE REPORT  DATE OF SURGERY: 11/21/2022  PATIENT: Lindsay Cortez, 82 y.o. female MRN: [id number]  DOB: 31-Jan-1941  PRE-OPERATIVE DIAGNOSIS: CKD 4  POST-OPERATIVE DIAGNOSIS:  Same  PROCEDURE: Left brachiocephalic AV fistula creation  SURGEON:  Curt Jews, M.D.  PHYSICIAN ASSISTANT: Fulton Mole, RNFA  The assistant was needed for exposure and to expedite the case  ANESTHESIA: MAC  EBL: per anesthesia record  Total I/O In: 600 [I.V.:500; IV Piggyback:100] Out: 0   BLOOD ADMINISTERED: none  DRAINS: none  SPECIMEN: none  COUNTS CORRECT:  YES  PATIENT DISPOSITION:  PACU - hemodynamically stable  PROCEDURE DETAILS: The patient was taken the operating placed supine position where the area of the left arm was prepped and draped in the usual sterile fashion.  Incision was made over the antecubital space and carried down to isolate the cephalic vein which was of excellent caliber and the brachial artery.  The artery was large and had minimal atherosclerotic change.  The vein was ligated distally and divided and was mobilized to the level of the brachial artery.  The artery was occluded proximally and distally and was opened with an 11 blade and extended longitudinally with Potts scissors.  A small arteriotomy was created to reduce risk of steal.  The vein was cut to the appropriate length and was spatulated and sewn end-to-side to the artery with a running 6-0 Prolene suture.  Clamps were removed and excellent thrill was noted.  The patient maintained a left radial pulse.  The wound was irrigated with saline.  Hemostasis was obtained with electrocautery.  The wound was closed with 3-0 Vicryl in the subcutaneous and subcuticular tissue.  Sterile dressing was applied and the patient was transferred to the recovery room in stable condition   Rosetta Posner, M.D., [facility] 11/21/2022 11:23 AM  Note: Portions of this report may have been transcribed using voice recognition  software.  Every effort has been made to ensure accuracy; however, inadvertent computerized transcription errors may still be present.

## 2022-11-28 DIAGNOSIS — E559 Vitamin D deficiency, unspecified: Secondary | ICD-10-CM | POA: Diagnosis not present

## 2022-11-28 DIAGNOSIS — I129 Hypertensive chronic kidney disease with stage 1 through stage 4 chronic kidney disease, or unspecified chronic kidney disease: Secondary | ICD-10-CM | POA: Diagnosis not present

## 2022-11-28 DIAGNOSIS — R808 Other proteinuria: Secondary | ICD-10-CM | POA: Diagnosis not present

## 2022-11-28 DIAGNOSIS — I770 Arteriovenous fistula, acquired: Secondary | ICD-10-CM | POA: Diagnosis not present

## 2022-11-28 DIAGNOSIS — E211 Secondary hyperparathyroidism, not elsewhere classified: Secondary | ICD-10-CM | POA: Diagnosis not present

## 2022-11-28 DIAGNOSIS — D638 Anemia in other chronic diseases classified elsewhere: Secondary | ICD-10-CM | POA: Diagnosis not present

## 2022-11-28 DIAGNOSIS — N185 Chronic kidney disease, stage 5: Secondary | ICD-10-CM | POA: Diagnosis not present

## 2022-11-28 DIAGNOSIS — N170 Acute kidney failure with tubular necrosis: Secondary | ICD-10-CM | POA: Diagnosis not present

## 2022-11-28 DIAGNOSIS — N19 Unspecified kidney failure: Secondary | ICD-10-CM | POA: Diagnosis not present

## 2022-11-28 DIAGNOSIS — E1122 Type 2 diabetes mellitus with diabetic chronic kidney disease: Secondary | ICD-10-CM | POA: Diagnosis not present

## 2022-12-01 ENCOUNTER — Encounter (HOSPITAL_COMMUNITY): Payer: Self-pay | Admitting: Vascular Surgery

## 2022-12-01 DIAGNOSIS — E1165 Type 2 diabetes mellitus with hyperglycemia: Secondary | ICD-10-CM | POA: Diagnosis not present

## 2022-12-01 DIAGNOSIS — I10 Essential (primary) hypertension: Secondary | ICD-10-CM | POA: Diagnosis not present

## 2022-12-01 DIAGNOSIS — N186 End stage renal disease: Secondary | ICD-10-CM | POA: Diagnosis not present

## 2022-12-01 DIAGNOSIS — E1129 Type 2 diabetes mellitus with other diabetic kidney complication: Secondary | ICD-10-CM | POA: Diagnosis not present

## 2022-12-01 DIAGNOSIS — E663 Overweight: Secondary | ICD-10-CM | POA: Diagnosis not present

## 2022-12-01 DIAGNOSIS — D518 Other vitamin B12 deficiency anemias: Secondary | ICD-10-CM | POA: Diagnosis not present

## 2022-12-01 DIAGNOSIS — E1140 Type 2 diabetes mellitus with diabetic neuropathy, unspecified: Secondary | ICD-10-CM | POA: Diagnosis not present

## 2022-12-01 DIAGNOSIS — Z6828 Body mass index (BMI) 28.0-28.9, adult: Secondary | ICD-10-CM | POA: Diagnosis not present

## 2022-12-01 DIAGNOSIS — M1991 Primary osteoarthritis, unspecified site: Secondary | ICD-10-CM | POA: Diagnosis not present

## 2022-12-01 DIAGNOSIS — J449 Chronic obstructive pulmonary disease, unspecified: Secondary | ICD-10-CM | POA: Diagnosis not present

## 2022-12-12 DIAGNOSIS — J441 Chronic obstructive pulmonary disease with (acute) exacerbation: Secondary | ICD-10-CM | POA: Diagnosis not present

## 2022-12-27 ENCOUNTER — Ambulatory Visit (INDEPENDENT_AMBULATORY_CARE_PROVIDER_SITE_OTHER): Payer: PPO | Admitting: Vascular Surgery

## 2022-12-27 ENCOUNTER — Encounter: Payer: Self-pay | Admitting: Vascular Surgery

## 2022-12-27 VITALS — BP 104/69 | HR 122 | Temp 97.3°F | Ht <= 58 in | Wt 130.2 lb

## 2022-12-27 DIAGNOSIS — N184 Chronic kidney disease, stage 4 (severe): Secondary | ICD-10-CM

## 2022-12-27 NOTE — Progress Notes (Signed)
Vascular and Vein Specialist of [place]  Patient name: MANDISA PERSINGER MRN: [id number] DOB: Jun 17, 1941 Sex: female  REASON FOR VISIT: Follow-up left brachiocephalic AV fistula creation on 11/21/2022  HPI: DILANA MCPHIE is a 82 y.o. female here today for follow-up.  She is here with her daughter.  She has no steal symptoms.  She is still stable and not on hemodialysis  Current Outpatient Medications  Medication Sig Dispense Refill   acetaminophen (TYLENOL) 500 MG tablet Take 1,000 mg by mouth daily as needed (pain). For pain     albuterol (VENTOLIN HFA) 108 (90 BASE) MCG/ACT inhaler Inhale 2 puffs into the lungs every 4 (four) hours as needed for wheezing or shortness of breath. For rescue 1 Inhaler 1   alendronate (FOSAMAX) 70 MG tablet Take 70 mg by mouth every Monday.     allopurinol (ZYLOPRIM) 100 MG tablet Take 200 mg by mouth in the morning.     amLODipine (NORVASC) 5 MG tablet Take 5 mg by mouth in the morning.     budesonide-formoterol (SYMBICORT) 160-4.5 MCG/ACT inhaler Inhale 1 puff into the lungs 2 (two) times daily.     calcium acetate (PHOSLO) 667 MG tablet Take 667 mg by mouth daily with supper.     Calcium Carb-Cholecalciferol (CALCIUM 600 + D PO) Take 1 tablet by mouth every evening.     Cholecalciferol (VITAMIN D) 2000 UNITS CAPS Take 2,000 Units by mouth every evening.     ferrous sulfate 325 (65 FE) MG EC tablet Take 325 mg by mouth every morning.     furosemide (LASIX) 20 MG tablet Take 20 mg by mouth daily as needed (fluid retention.).     glimepiride (AMARYL) 1 MG tablet Take 1 mg by mouth in the morning.     ipratropium-albuterol (DUONEB) 0.5-2.5 (3) MG/3ML SOLN Take 3 mLs by nebulization every 4 (four) hours as needed. (Patient taking differently: Take 3 mLs by nebulization every 4 (four) hours as needed (illness (bronchitis/pneumonia)).) 360 mL 0   levothyroxine (SYNTHROID) 100 MCG tablet Take 100 mcg by mouth daily before  breakfast.     loperamide (IMODIUM) 2 MG capsule Take 2 mg by mouth in the morning.     metoprolol succinate (TOPROL-XL) 50 MG 24 hr tablet Take 50 mg by mouth in the morning.     Multiple Vitamins-Minerals (HAIR SKIN NAILS PO) Take 1 tablet by mouth in the morning.     promethazine (PHENERGAN) 25 MG suppository Place 1 suppository (25 mg total) rectally every 6 (six) hours as needed for nausea or vomiting. 12 each 0   simvastatin (ZOCOR) 10 MG tablet Take 10 mg by mouth at bedtime.     sodium bicarbonate 650 MG tablet Take 650 mg by mouth in the morning and at bedtime.     zinc sulfate 220 (50 Zn) MG capsule Take 220 mg by mouth in the morning.     No current facility-administered medications for this visit.     PHYSICAL EXAM: Vitals:   12/27/22 1319  BP: 104/69  Pulse: (!) 122  Temp: (!) 97.3 F (36.3 C)  Weight: 130 lb 3.2 oz (59.1 kg)  Height: 4\' 7"  (1.397 m)    GENERAL: The patient is a well-nourished female, in no acute distress. The vital signs are documented above. Excellent healing of her antecubital incision.  2+ left radial pulse.  Excellent thrill and maturation of her left arm vein.  The vein runs close to the surface and is good size.  She does have some moderate tortuosity.    MEDICAL ISSUES: Stable status post left AV fistula creation.  I feel that she has a very high likelihood of success should she come to hemodialysis.  She will see [place] again on an as-needed basis   Larina Earthly, MD FACS [facility] Tel [phone]  Note: Portions of this report may have been transcribed using voice recognition software.  Every effort has been made to ensure accuracy; however, inadvertent computerized transcription errors may still be present.

## 2023-01-31 DIAGNOSIS — Z0001 Encounter for general adult medical examination with abnormal findings: Secondary | ICD-10-CM | POA: Diagnosis not present

## 2023-01-31 DIAGNOSIS — E1129 Type 2 diabetes mellitus with other diabetic kidney complication: Secondary | ICD-10-CM | POA: Diagnosis not present

## 2023-01-31 DIAGNOSIS — E1165 Type 2 diabetes mellitus with hyperglycemia: Secondary | ICD-10-CM | POA: Diagnosis not present

## 2023-01-31 DIAGNOSIS — N186 End stage renal disease: Secondary | ICD-10-CM | POA: Diagnosis not present

## 2023-02-01 DIAGNOSIS — M12811 Other specific arthropathies, not elsewhere classified, right shoulder: Secondary | ICD-10-CM | POA: Diagnosis not present

## 2023-02-01 DIAGNOSIS — M1991 Primary osteoarthritis, unspecified site: Secondary | ICD-10-CM | POA: Diagnosis not present

## 2023-02-01 DIAGNOSIS — Z6827 Body mass index (BMI) 27.0-27.9, adult: Secondary | ICD-10-CM | POA: Diagnosis not present

## 2023-02-01 DIAGNOSIS — J449 Chronic obstructive pulmonary disease, unspecified: Secondary | ICD-10-CM | POA: Diagnosis not present

## 2023-02-01 DIAGNOSIS — I10 Essential (primary) hypertension: Secondary | ICD-10-CM | POA: Diagnosis not present

## 2023-02-01 DIAGNOSIS — E663 Overweight: Secondary | ICD-10-CM | POA: Diagnosis not present

## 2023-02-01 DIAGNOSIS — E1129 Type 2 diabetes mellitus with other diabetic kidney complication: Secondary | ICD-10-CM | POA: Diagnosis not present

## 2023-02-01 DIAGNOSIS — E039 Hypothyroidism, unspecified: Secondary | ICD-10-CM | POA: Diagnosis not present

## 2023-02-01 DIAGNOSIS — E1165 Type 2 diabetes mellitus with hyperglycemia: Secondary | ICD-10-CM | POA: Diagnosis not present

## 2023-02-01 DIAGNOSIS — E538 Deficiency of other specified B group vitamins: Secondary | ICD-10-CM | POA: Diagnosis not present

## 2023-02-01 DIAGNOSIS — E1140 Type 2 diabetes mellitus with diabetic neuropathy, unspecified: Secondary | ICD-10-CM | POA: Diagnosis not present

## 2023-02-01 DIAGNOSIS — N184 Chronic kidney disease, stage 4 (severe): Secondary | ICD-10-CM | POA: Diagnosis not present

## 2023-02-07 DIAGNOSIS — I770 Arteriovenous fistula, acquired: Secondary | ICD-10-CM | POA: Diagnosis not present

## 2023-02-07 DIAGNOSIS — D631 Anemia in chronic kidney disease: Secondary | ICD-10-CM | POA: Diagnosis not present

## 2023-02-07 DIAGNOSIS — E559 Vitamin D deficiency, unspecified: Secondary | ICD-10-CM | POA: Diagnosis not present

## 2023-02-07 DIAGNOSIS — N2581 Secondary hyperparathyroidism of renal origin: Secondary | ICD-10-CM | POA: Diagnosis not present

## 2023-02-13 DIAGNOSIS — E119 Type 2 diabetes mellitus without complications: Secondary | ICD-10-CM | POA: Diagnosis not present

## 2023-02-21 DIAGNOSIS — N1831 Chronic kidney disease, stage 3a: Secondary | ICD-10-CM | POA: Diagnosis not present

## 2023-02-21 DIAGNOSIS — E890 Postprocedural hypothyroidism: Secondary | ICD-10-CM | POA: Diagnosis not present

## 2023-02-21 DIAGNOSIS — E7800 Pure hypercholesterolemia, unspecified: Secondary | ICD-10-CM | POA: Diagnosis not present

## 2023-02-21 DIAGNOSIS — Z808 Family history of malignant neoplasm of other organs or systems: Secondary | ICD-10-CM | POA: Diagnosis not present

## 2023-02-21 DIAGNOSIS — I10 Essential (primary) hypertension: Secondary | ICD-10-CM | POA: Diagnosis not present

## 2023-02-21 DIAGNOSIS — E1165 Type 2 diabetes mellitus with hyperglycemia: Secondary | ICD-10-CM | POA: Diagnosis not present

## 2023-02-26 DIAGNOSIS — E1122 Type 2 diabetes mellitus with diabetic chronic kidney disease: Secondary | ICD-10-CM | POA: Diagnosis not present

## 2023-02-26 DIAGNOSIS — D631 Anemia in chronic kidney disease: Secondary | ICD-10-CM | POA: Diagnosis not present

## 2023-02-26 DIAGNOSIS — N189 Chronic kidney disease, unspecified: Secondary | ICD-10-CM | POA: Diagnosis not present

## 2023-02-26 DIAGNOSIS — N185 Chronic kidney disease, stage 5: Secondary | ICD-10-CM | POA: Diagnosis not present

## 2023-02-26 DIAGNOSIS — R808 Other proteinuria: Secondary | ICD-10-CM | POA: Diagnosis not present

## 2023-02-26 DIAGNOSIS — N2581 Secondary hyperparathyroidism of renal origin: Secondary | ICD-10-CM | POA: Diagnosis not present

## 2023-02-26 DIAGNOSIS — D649 Anemia, unspecified: Secondary | ICD-10-CM | POA: Diagnosis not present

## 2023-02-26 DIAGNOSIS — N184 Chronic kidney disease, stage 4 (severe): Secondary | ICD-10-CM | POA: Diagnosis not present

## 2023-03-05 DIAGNOSIS — R808 Other proteinuria: Secondary | ICD-10-CM | POA: Diagnosis not present

## 2023-03-05 DIAGNOSIS — N185 Chronic kidney disease, stage 5: Secondary | ICD-10-CM | POA: Diagnosis not present

## 2023-03-05 DIAGNOSIS — E1122 Type 2 diabetes mellitus with diabetic chronic kidney disease: Secondary | ICD-10-CM | POA: Diagnosis not present

## 2023-03-12 DIAGNOSIS — E538 Deficiency of other specified B group vitamins: Secondary | ICD-10-CM | POA: Diagnosis not present

## 2023-03-12 DIAGNOSIS — N189 Chronic kidney disease, unspecified: Secondary | ICD-10-CM | POA: Diagnosis not present

## 2023-03-12 DIAGNOSIS — N185 Chronic kidney disease, stage 5: Secondary | ICD-10-CM | POA: Diagnosis not present

## 2023-03-12 DIAGNOSIS — E1122 Type 2 diabetes mellitus with diabetic chronic kidney disease: Secondary | ICD-10-CM | POA: Diagnosis not present

## 2023-03-12 DIAGNOSIS — E559 Vitamin D deficiency, unspecified: Secondary | ICD-10-CM | POA: Diagnosis not present

## 2023-03-12 DIAGNOSIS — D631 Anemia in chronic kidney disease: Secondary | ICD-10-CM | POA: Diagnosis not present

## 2023-03-12 DIAGNOSIS — N2581 Secondary hyperparathyroidism of renal origin: Secondary | ICD-10-CM | POA: Diagnosis not present

## 2023-03-12 DIAGNOSIS — R808 Other proteinuria: Secondary | ICD-10-CM | POA: Diagnosis not present

## 2023-03-19 DIAGNOSIS — R808 Other proteinuria: Secondary | ICD-10-CM | POA: Diagnosis not present

## 2023-03-19 DIAGNOSIS — E1122 Type 2 diabetes mellitus with diabetic chronic kidney disease: Secondary | ICD-10-CM | POA: Diagnosis not present

## 2023-03-19 DIAGNOSIS — N185 Chronic kidney disease, stage 5: Secondary | ICD-10-CM | POA: Diagnosis not present

## 2023-03-20 DIAGNOSIS — E1122 Type 2 diabetes mellitus with diabetic chronic kidney disease: Secondary | ICD-10-CM | POA: Diagnosis not present

## 2023-03-20 DIAGNOSIS — R808 Other proteinuria: Secondary | ICD-10-CM | POA: Diagnosis not present

## 2023-03-20 DIAGNOSIS — N185 Chronic kidney disease, stage 5: Secondary | ICD-10-CM | POA: Diagnosis not present

## 2023-03-23 DIAGNOSIS — E1165 Type 2 diabetes mellitus with hyperglycemia: Secondary | ICD-10-CM | POA: Diagnosis not present

## 2023-03-23 DIAGNOSIS — E890 Postprocedural hypothyroidism: Secondary | ICD-10-CM | POA: Diagnosis not present

## 2023-03-23 DIAGNOSIS — I10 Essential (primary) hypertension: Secondary | ICD-10-CM | POA: Diagnosis not present

## 2023-03-28 ENCOUNTER — Inpatient Hospital Stay: Payer: PPO

## 2023-03-28 ENCOUNTER — Inpatient Hospital Stay: Payer: PPO | Admitting: Hematology

## 2023-03-29 DIAGNOSIS — N186 End stage renal disease: Secondary | ICD-10-CM | POA: Diagnosis not present

## 2023-03-29 DIAGNOSIS — D631 Anemia in chronic kidney disease: Secondary | ICD-10-CM | POA: Diagnosis not present

## 2023-03-29 DIAGNOSIS — Z992 Dependence on renal dialysis: Secondary | ICD-10-CM | POA: Diagnosis not present

## 2023-03-29 DIAGNOSIS — D509 Iron deficiency anemia, unspecified: Secondary | ICD-10-CM | POA: Diagnosis not present

## 2023-03-30 DIAGNOSIS — I871 Compression of vein: Secondary | ICD-10-CM | POA: Diagnosis not present

## 2023-03-30 DIAGNOSIS — Z992 Dependence on renal dialysis: Secondary | ICD-10-CM | POA: Diagnosis not present

## 2023-03-30 DIAGNOSIS — T82898A Other specified complication of vascular prosthetic devices, implants and grafts, initial encounter: Secondary | ICD-10-CM | POA: Diagnosis not present

## 2023-03-30 DIAGNOSIS — N186 End stage renal disease: Secondary | ICD-10-CM | POA: Diagnosis not present

## 2023-04-13 DIAGNOSIS — N186 End stage renal disease: Secondary | ICD-10-CM | POA: Diagnosis not present

## 2023-04-13 DIAGNOSIS — Z992 Dependence on renal dialysis: Secondary | ICD-10-CM | POA: Diagnosis not present

## 2023-04-13 DIAGNOSIS — T82898A Other specified complication of vascular prosthetic devices, implants and grafts, initial encounter: Secondary | ICD-10-CM | POA: Diagnosis not present

## 2023-04-27 ENCOUNTER — Other Ambulatory Visit (HOSPITAL_COMMUNITY)
Admission: RE | Admit: 2023-04-27 | Discharge: 2023-04-27 | Disposition: A | Discharge status: Home / Self Care | Payer: PPO | Source: Ambulatory Visit | Attending: Nephrology | Admitting: Nephrology

## 2023-04-27 DIAGNOSIS — N390 Urinary tract infection, site not specified: Secondary | ICD-10-CM | POA: Insufficient documentation

## 2023-04-27 LAB — CBC WITH DIFFERENTIAL/PLATELET
Abs Immature Granulocytes: 0.25 10*3/uL — ABNORMAL HIGH (ref 0.00–0.07)
Basophils Absolute: 0.1 10*3/uL (ref 0.0–0.1)
Basophils Relative: 0 %
Eosinophils Absolute: 0.1 10*3/uL (ref 0.0–0.5)
Eosinophils Relative: 1 %
HCT: 29.4 % — ABNORMAL LOW (ref 36.0–46.0)
Hemoglobin: 9.3 g/dL — ABNORMAL LOW (ref 12.0–15.0)
Immature Granulocytes: 1 %
Lymphocytes Relative: 9 %
Lymphs Abs: 1.8 10*3/uL (ref 0.7–4.0)
MCH: 31.1 pg (ref 26.0–34.0)
MCHC: 31.6 g/dL (ref 30.0–36.0)
MCV: 98.3 fL (ref 80.0–100.0)
Monocytes Absolute: 2.2 10*3/uL — ABNORMAL HIGH (ref 0.1–1.0)
Monocytes Relative: 11 %
Neutro Abs: 15.1 10*3/uL — ABNORMAL HIGH (ref 1.7–7.7)
Neutrophils Relative %: 78 %
Platelets: 269 10*3/uL (ref 150–400)
RBC: 2.99 MIL/uL — ABNORMAL LOW (ref 3.87–5.11)
RDW: 15.5 % (ref 11.5–15.5)
WBC: 19.5 10*3/uL — ABNORMAL HIGH (ref 4.0–10.5)
nRBC: 0 % (ref 0.0–0.2)

## 2023-04-27 LAB — RENAL FUNCTION PANEL
Albumin: 3.4 g/dL — ABNORMAL LOW (ref 3.5–5.0)
Anion gap: 9 (ref 5–15)
BUN: 23 mg/dL (ref 8–23)
CO2: 25 mmol/L (ref 22–32)
Calcium: 8.8 mg/dL — ABNORMAL LOW (ref 8.9–10.3)
Chloride: 103 mmol/L (ref 98–111)
Creatinine, Ser: 3.23 mg/dL — ABNORMAL HIGH (ref 0.44–1.00)
GFR, Estimated: 14 mL/min — ABNORMAL LOW (ref >60)
Glucose, Bld: 138 mg/dL — ABNORMAL HIGH (ref 70–99)
Phosphorus: 3.9 mg/dL (ref 2.5–4.6)
Potassium: 3.6 mmol/L (ref 3.5–5.1)
Sodium: 137 mmol/L (ref 135–145)

## 2023-04-27 LAB — URINALYSIS, COMPLETE (UACMP) WITH MICROSCOPIC
Bilirubin Urine: NEGATIVE
Glucose, UA: NEGATIVE mg/dL
Ketones, ur: NEGATIVE mg/dL
Nitrite: NEGATIVE
Protein, ur: 100 mg/dL — AB
Specific Gravity, Urine: 1.008 (ref 1.005–1.030)
WBC, UA: >50 WBC/hpf (ref 0–5)
pH: 8 (ref 5.0–8.0)

## 2023-04-28 DIAGNOSIS — N186 End stage renal disease: Secondary | ICD-10-CM | POA: Diagnosis not present

## 2023-04-28 DIAGNOSIS — J449 Chronic obstructive pulmonary disease, unspecified: Secondary | ICD-10-CM | POA: Diagnosis not present

## 2023-04-28 DIAGNOSIS — M818 Other osteoporosis without current pathological fracture: Secondary | ICD-10-CM | POA: Diagnosis not present

## 2023-04-28 DIAGNOSIS — E1129 Type 2 diabetes mellitus with other diabetic kidney complication: Secondary | ICD-10-CM | POA: Diagnosis not present

## 2023-04-28 DIAGNOSIS — E039 Hypothyroidism, unspecified: Secondary | ICD-10-CM | POA: Diagnosis not present

## 2023-04-29 DIAGNOSIS — N186 End stage renal disease: Secondary | ICD-10-CM | POA: Diagnosis not present

## 2023-04-29 DIAGNOSIS — Z992 Dependence on renal dialysis: Secondary | ICD-10-CM | POA: Diagnosis not present

## 2023-04-29 LAB — URINE CULTURE: Culture: 70000 — AB

## 2023-04-30 DIAGNOSIS — D509 Iron deficiency anemia, unspecified: Secondary | ICD-10-CM | POA: Diagnosis not present

## 2023-04-30 DIAGNOSIS — Z992 Dependence on renal dialysis: Secondary | ICD-10-CM | POA: Diagnosis not present

## 2023-04-30 DIAGNOSIS — N186 End stage renal disease: Secondary | ICD-10-CM | POA: Diagnosis not present

## 2023-04-30 DIAGNOSIS — D631 Anemia in chronic kidney disease: Secondary | ICD-10-CM | POA: Diagnosis not present

## 2023-05-03 DIAGNOSIS — B078 Other viral warts: Secondary | ICD-10-CM | POA: Diagnosis not present

## 2023-05-16 ENCOUNTER — Other Ambulatory Visit: Payer: Self-pay | Admitting: *Deleted

## 2023-05-16 DIAGNOSIS — N184 Chronic kidney disease, stage 4 (severe): Secondary | ICD-10-CM

## 2023-05-22 ENCOUNTER — Ambulatory Visit: Payer: PPO | Admitting: Physician Assistant

## 2023-05-22 ENCOUNTER — Ambulatory Visit (HOSPITAL_COMMUNITY)
Admission: RE | Admit: 2023-05-22 | Discharge: 2023-05-22 | Disposition: A | Discharge status: Home / Self Care | Payer: PPO | Source: Ambulatory Visit | Attending: Vascular Surgery | Admitting: Vascular Surgery

## 2023-05-22 VITALS — BP 144/70 | HR 86 | Temp 97.3°F | Ht 59.0 in | Wt 118.2 lb

## 2023-05-22 DIAGNOSIS — N186 End stage renal disease: Secondary | ICD-10-CM | POA: Diagnosis not present

## 2023-05-22 DIAGNOSIS — Z992 Dependence on renal dialysis: Secondary | ICD-10-CM | POA: Diagnosis not present

## 2023-05-22 DIAGNOSIS — N184 Chronic kidney disease, stage 4 (severe): Secondary | ICD-10-CM | POA: Diagnosis not present

## 2023-05-22 NOTE — Progress Notes (Signed)
[facility] [place] HISTORY AND PHYSICAL   History of Present Illness:  Patient is a 82 y.o. year old female who presents for exam of her permanent hemodialysis access.  Dr. Arbie Cookey placed a left UE brachiocephalic AV fistula creation for CKD stage IV/V.  She started HD sometime in April and had an infiltration event.  They tried a second time and then decided to place a [facility] right internal jugular .  At some point Dr. Allena Katz performed a fistulogram with veno plasty per the patient.  They have not attempted another stick since then.    She denies issues with the [facility], no symptoms of steal, no pain, loss of motor or sensation in the left hand.      Past Medical History:  Diagnosis Date   Arthritis    Asthma    Back pain    COPD (chronic obstructive pulmonary disease) (HCC)    Diabetes (HCC) 09/01/2014   Diabetes mellitus    x 15 yrs   Gout    Hypertension    Kidney stones    Pancreatic cyst    Pneumonia    Renal disorder    Renal insufficiency    Shoulder dislocation    Vertigo     Past Surgical History:  Procedure Laterality Date   ABDOMINAL HYSTERECTOMY     AV FISTULA PLACEMENT Left 11/21/2022   Procedure: LEFT ARM ARTERIOVENOUS (AV) FISTULA CREATION;  Surgeon: Larina Earthly, MD;  Location: [facility];  Service: Vascular;  Laterality: Left;   BLADDER SURGERY     bladder tac   BREAST SURGERY Left    benign   FOOT SURGERY Bilateral    bunions     Social History Social History   Tobacco Use   Smoking status: Never   Smokeless tobacco: Never  Vaping Use   Vaping status: Never Used  Substance Use Topics   Alcohol use: No    Alcohol/week: 0.0 standard drinks of alcohol   Drug use: No    Family History Family History  Problem Relation Age of Onset   Breast cancer Mother    Emphysema Father    COPD Sister    Atrial fibrillation Sister    Obesity Brother    Cirrhosis Daughter    COPD Son    Diabetes Brother    Asthma Brother      Allergies  Allergies  Allergen Reactions   Fentanyl Nausea And Vomiting   Codeine     Narcotic pain medicine makes her nauseated.  Says was told to only take tylenol due to kidney disease.   Levaquin [Levofloxacin In D5w] Nausea And Vomiting     Current Outpatient Medications  Medication Sig Dispense Refill   acetaminophen (TYLENOL) 500 MG tablet Take 1,000 mg by mouth daily as needed (pain). For pain     albuterol (VENTOLIN HFA) 108 (90 BASE) MCG/ACT inhaler Inhale 2 puffs into the lungs every 4 (four) hours as needed for wheezing or shortness of breath. For rescue 1 Inhaler 1   alendronate (FOSAMAX) 70 MG tablet Take 70 mg by mouth every Monday.     allopurinol (ZYLOPRIM) 100 MG tablet Take 200 mg by mouth in the morning.     amLODipine (NORVASC) 5 MG tablet Take 5 mg by mouth in the morning.     budesonide-formoterol (SYMBICORT) 160-4.5 MCG/ACT inhaler Inhale 1 puff into the lungs 2 (two) times daily.     calcium acetate (PHOSLO) 667 MG tablet Take 667 mg by  mouth daily with supper.     Calcium Carb-Cholecalciferol (CALCIUM 600 + D PO) Take 1 tablet by mouth every evening.     Cholecalciferol (VITAMIN D) 2000 UNITS CAPS Take 2,000 Units by mouth every evening.     ferrous sulfate 325 (65 FE) MG EC tablet Take 325 mg by mouth every morning.     furosemide (LASIX) 20 MG tablet Take 20 mg by mouth daily as needed (fluid retention.).     glimepiride (AMARYL) 1 MG tablet Take 1 mg by mouth in the morning.     ipratropium-albuterol (DUONEB) 0.5-2.5 (3) MG/3ML SOLN Take 3 mLs by nebulization every 4 (four) hours as needed. (Patient taking differently: Take 3 mLs by nebulization every 4 (four) hours as needed (illness (bronchitis/pneumonia)).) 360 mL 0   levothyroxine (SYNTHROID) 100 MCG tablet Take 100 mcg by mouth daily before breakfast.     loperamide (IMODIUM) 2 MG capsule Take 2 mg by mouth in the morning.     metoprolol succinate (TOPROL-XL) 50 MG 24 hr tablet Take 50 mg by mouth  in the morning.     Multiple Vitamins-Minerals (HAIR SKIN NAILS PO) Take 1 tablet by mouth in the morning.     promethazine (PHENERGAN) 25 MG suppository Place 1 suppository (25 mg total) rectally every 6 (six) hours as needed for nausea or vomiting. 12 each 0   simvastatin (ZOCOR) 10 MG tablet Take 10 mg by mouth at bedtime.     sodium bicarbonate 650 MG tablet Take 650 mg by mouth in the morning and at bedtime.     zinc sulfate 220 (50 Zn) MG capsule Take 220 mg by mouth in the morning.     No current facility-administered medications for this visit.    ROS:   General:  No weight loss, Fever, chills  HEENT: No recent headaches, no nasal bleeding, no visual changes, no sore throat  Neurologic: No dizziness, blackouts, seizures. No recent symptoms of stroke or mini- stroke. No recent episodes of slurred speech, or temporary blindness.  Cardiac: No recent episodes of chest pain/pressure, no shortness of breath at rest.  No shortness of breath with exertion.  Denies history of atrial fibrillation or irregular heartbeat  Vascular: No history of rest pain in feet.  No history of claudication.  No history of non-healing ulcer, No history of DVT   Pulmonary: No home oxygen, no productive cough, no hemoptysis,  No asthma or wheezing  Musculoskeletal:  [ ]  Arthritis, [ ]  Low back pain,  [ ]  Joint pain  Hematologic:No history of hypercoagulable state.  No history of easy bleeding.  No history of anemia  Gastrointestinal: No hematochezia or melena,  No gastroesophageal reflux, no trouble swallowing  Urinary: [ ]  chronic Kidney disease, [x ] on HD - [ x] MWF or [ ]  TTHS, [ ]  Burning with urination, [ ]  Frequent urination, [ ]  Difficulty urinating;   Skin: No rashes  Psychological: No history of anxiety,  No history of depression   Physical Examination  Vitals:   05/22/23 1219  BP: (!) 144/70  Pulse: 86  Temp: (!) 97.3 F (36.3 C)  TempSrc: Temporal  SpO2: 92%  Weight: 118 lb 3.2  oz (53.6 kg)  Height: 4\' 11"  (1.499 m)    Body mass index is 23.87 kg/m.  General:  Alert and oriented, no acute distress HEENT: Normal Neck: No bruit or JVD Pulmonary: Clear to auscultation bilaterally Cardiac: Regular Rate and Rhythm without murmur Gastrointestinal: Soft, non-tender, non-distended, no mass,  no scars Skin: No rash Extremity Pulses:  2+ radial, brachial pulses bilaterally, excellent palpable thrill in the fistula Musculoskeletal: No deformity or edema  Neurologic: Upper and lower extremity motor 5/5 and symmetric  DATA:   Findings:  +--------------------+----------+-----------------+--------+  AVF                PSV (cm/s)Flow Vol (mL/min)Comments  +--------------------+----------+-----------------+--------+  Native artery inflow   299          1624                 +--------------------+----------+-----------------+--------+  AVF Anastomosis        246                               +--------------------+----------+-----------------+--------+     +------------+----------+-------------+----------+-------------------------  ----+  OUTFLOW VEINPSV (cm/s)Diameter (cm)Depth (cm)          Describe              +------------+----------+-------------+----------+-------------------------  ----+  Prox UA        101        0.84        0.56                                   +------------+----------+-------------+----------+-------------------------  ----+  Mid UA         109        0.88        0.42                                   +------------+----------+-------------+----------+-------------------------  ----+  Dist UA        155        1.13        0.58                                   +------------+----------+-------------+----------+-------------------------  ----+  AC Fossa       151        1.25        0.42      branch posterior to  the                                                       outflow vein 0.4  cm        +------------+----------+-------------+----------+-------------------------  ----+     Summary:  Patent left BC AVF.  Flow volume is 1624 ml/min.  No evidence of stenosis or thrombus.   ASSESSMENT/PLAN: Left AV fistula with excellent thrill to palpation The duplex demonstrates excellent flow > 1600 ml/min. The size is greater than 0.8 cm  The fistula can be accessed now.  If she has any problems she can call our office and we can perform a fistulagram.  There is no indication that is not a successful fistula.       Mosetta Pigeon PA-C [facility] [place] Office: [phone] Pager: [phone]

## 2023-05-29 DIAGNOSIS — Z992 Dependence on renal dialysis: Secondary | ICD-10-CM | POA: Diagnosis not present

## 2023-05-29 DIAGNOSIS — N186 End stage renal disease: Secondary | ICD-10-CM | POA: Diagnosis not present

## 2023-05-30 DIAGNOSIS — D631 Anemia in chronic kidney disease: Secondary | ICD-10-CM | POA: Diagnosis not present

## 2023-05-30 DIAGNOSIS — Z23 Encounter for immunization: Secondary | ICD-10-CM | POA: Diagnosis not present

## 2023-05-30 DIAGNOSIS — D509 Iron deficiency anemia, unspecified: Secondary | ICD-10-CM | POA: Diagnosis not present

## 2023-05-30 DIAGNOSIS — N186 End stage renal disease: Secondary | ICD-10-CM | POA: Diagnosis not present

## 2023-05-30 DIAGNOSIS — Z992 Dependence on renal dialysis: Secondary | ICD-10-CM | POA: Diagnosis not present

## 2023-05-30 DIAGNOSIS — T82898A Other specified complication of vascular prosthetic devices, implants and grafts, initial encounter: Secondary | ICD-10-CM | POA: Diagnosis not present

## 2023-06-05 ENCOUNTER — Ambulatory Visit
Admission: EM | Admit: 2023-06-05 | Discharge: 2023-06-05 | Disposition: A | Discharge status: Home / Self Care | Payer: PPO | Attending: Nurse Practitioner | Admitting: Nurse Practitioner

## 2023-06-05 DIAGNOSIS — N186 End stage renal disease: Secondary | ICD-10-CM | POA: Insufficient documentation

## 2023-06-05 DIAGNOSIS — I120 Hypertensive chronic kidney disease with stage 5 chronic kidney disease or end stage renal disease: Secondary | ICD-10-CM | POA: Insufficient documentation

## 2023-06-05 DIAGNOSIS — E1122 Type 2 diabetes mellitus with diabetic chronic kidney disease: Secondary | ICD-10-CM | POA: Diagnosis not present

## 2023-06-05 DIAGNOSIS — D631 Anemia in chronic kidney disease: Secondary | ICD-10-CM | POA: Insufficient documentation

## 2023-06-05 DIAGNOSIS — Z87440 Personal history of urinary (tract) infections: Secondary | ICD-10-CM | POA: Insufficient documentation

## 2023-06-05 DIAGNOSIS — N3001 Acute cystitis with hematuria: Secondary | ICD-10-CM

## 2023-06-05 DIAGNOSIS — Z992 Dependence on renal dialysis: Secondary | ICD-10-CM | POA: Insufficient documentation

## 2023-06-05 LAB — POCT URINALYSIS DIP (MANUAL ENTRY)
Bilirubin, UA: NEGATIVE
Glucose, UA: NEGATIVE mg/dL
Ketones, POC UA: NEGATIVE mg/dL
Nitrite, UA: NEGATIVE
Protein Ur, POC: >=300 mg/dL — AB
Spec Grav, UA: 1.02 (ref 1.010–1.025)
Urobilinogen, UA: 0.2 U/dL
pH, UA: 8.5 — AB (ref 5.0–8.0)

## 2023-06-05 MED ORDER — CEPHALEXIN 500 MG PO CAPS
500.0000 mg | ORAL_CAPSULE | Freq: Two times a day (BID) | ORAL | 0 refills | Status: AC
Start: 2023-06-05 — End: 2023-06-10

## 2023-06-05 NOTE — ED Triage Notes (Signed)
Pt c/o UTI symptoms, burning and tingling with urination. Pt states her urine is green tinted, sine last night, odor to the urine.

## 2023-06-05 NOTE — Discharge Instructions (Signed)
We are treating you today for urinary tract infection with Keflex.  On the days you have dialysis, take the Keflex after dialysis.  Try to space the medication out by 12 hours for each dose.  If your symptoms worsen and you develop fever, body aches or chills, nausea/vomiting and unable to keep fluids down, or severe pain in your stomach or back, please seek care in the emergency room.

## 2023-06-05 NOTE — ED Provider Notes (Signed)
[facility]    CSN: [id number] Arrival date & time: 06/05/23  0903      History   Chief Complaint No chief complaint on file.   HPI Lindsay Cortez is a 82 y.o. female.   Patient presents today with female caregiver for 2-day history of burning with urination, increased urinary frequency, and voiding smaller amounts.  She denies hematuria, abdominal pain, flank pain, back pain, and vaginal discharge.  No fevers or nausea/vomiting.  Reports she receives dialysis on Mondays, Wednesdays, and Fridays.  Reports history of UTI a few weeks ago and she thinks symptoms fully improved after treatment for it.  She does not remember what she was treated with at that time.  Other than the most recent UTI, she denies antibiotic use in the past 90 days.    Past Medical History:  Diagnosis Date   Arthritis    Asthma    Back pain    COPD (chronic obstructive pulmonary disease) (HCC)    Diabetes (HCC) 09/01/2014   Diabetes mellitus    x 15 yrs   Gout    Hypertension    Kidney stones    Pancreatic cyst    Pneumonia    Renal disorder    Renal insufficiency    Shoulder dislocation    Vertigo     Patient Active Problem List   Diagnosis Date Noted   Acute kidney failure with lesion of tubular necrosis (HCC) 11/14/2022   Chronic kidney disease due to diabetes mellitus (HCC) 11/14/2022   Anemia of chronic disease 11/14/2022   Benign hypertensive kidney disease with chronic kidney disease 11/14/2022   CAP (community acquired pneumonia) 02/24/2021   Sepsis due to community-acquired pneumonia 02/24/2021   Hypoalbuminemia 02/24/2021   Hypothyroidism 02/24/2021   Hyperglycemia due to diabetes mellitus (HCC) 02/24/2021   Essential hypertension 02/24/2021   Leukocytosis 07/11/2019   Acute respiratory failure with hypoxia (HCC) 09/02/2014   Chronic kidney disease, stage 3 unspecified (HCC) 09/02/2014   Nausea and vomiting 09/01/2014   COPD (chronic obstructive pulmonary disease)  (HCC) 09/01/2014   Dehydration 09/01/2014   Hypoxia 09/01/2014   Diabetes mellitus (HCC) 09/01/2014   Acute exacerbation of chronic obstructive airways disease with asthma (HCC) 09/01/2014   Pancreatic mass 05/19/2014   Common bile duct calculus 05/19/2014   Right knee pain 09/18/2013   Lateral meniscus derangement 09/18/2013   Arthritis of right knee 09/18/2013    Past Surgical History:  Procedure Laterality Date   ABDOMINAL HYSTERECTOMY     AV FISTULA PLACEMENT Left 11/21/2022   Procedure: LEFT ARM ARTERIOVENOUS (AV) FISTULA CREATION;  Surgeon: Larina Earthly, MD;  Location: [facility];  Service: Vascular;  Laterality: Left;   BLADDER SURGERY     bladder tac   BREAST SURGERY Left    benign   FOOT SURGERY Bilateral    bunions    OB History     Gravida  2   Para  2   Term  2   Preterm      AB      Living  2      SAB      IAB      Ectopic      Multiple      Live Births               Home Medications    Prior to Admission medications   Medication Sig Start Date End Date Taking? Authorizing Provider  cephALEXin (KEFLEX) 500 MG capsule Take  1 capsule (500 mg total) by mouth 2 (two) times daily for 5 days. 06/05/23 06/10/23 Yes Cathlean Marseilles A, NP  mirtazapine (REMERON) 7.5 MG tablet Take 7.5 mg by mouth at bedtime. 05/22/23  Yes [provider]  TRADJENTA 5 MG TABS tablet Take 5 mg by mouth. 03/05/23  Yes [provider]  acetaminophen (TYLENOL) 500 MG tablet Take 1,000 mg by mouth daily as needed (pain). For pain    [provider]  albuterol (VENTOLIN HFA) 108 (90 BASE) MCG/ACT inhaler Inhale 2 puffs into the lungs every 4 (four) hours as needed for wheezing or shortness of breath. For rescue 09/05/14   Standley Brooking, MD  alendronate (FOSAMAX) 70 MG tablet Take 70 mg by mouth every Monday. 04/08/20   [provider]  allopurinol (ZYLOPRIM) 100 MG tablet Take 200 mg by mouth in the morning.    [provider]   amLODipine (NORVASC) 5 MG tablet Take 5 mg by mouth in the morning.    [provider]  budesonide-formoterol (SYMBICORT) 160-4.5 MCG/ACT inhaler Inhale 1 puff into the lungs 2 (two) times daily.    [provider]  calcium acetate (PHOSLO) 667 MG tablet Take 667 mg by mouth daily with supper. 10/30/22 10/30/23  [provider]  Calcium Carb-Cholecalciferol (CALCIUM 600 + D PO) Take 1 tablet by mouth every evening.    [provider]  Cholecalciferol (VITAMIN D) 2000 UNITS CAPS Take 2,000 Units by mouth every evening.    [provider]  ferrous sulfate 325 (65 FE) MG EC tablet Take 325 mg by mouth every morning.    [provider]  furosemide (LASIX) 20 MG tablet Take 20 mg by mouth daily as needed (fluid retention.). 03/25/21   [provider]  glimepiride (AMARYL) 1 MG tablet Take 1 mg by mouth in the morning.    [provider]  ipratropium-albuterol (DUONEB) 0.5-2.5 (3) MG/3ML SOLN Take 3 mLs by nebulization every 4 (four) hours as needed. Patient taking differently: Take 3 mLs by nebulization every 4 (four) hours as needed (illness (bronchitis/pneumonia)). 05/15/22   Particia Nearing, PA-C  levothyroxine (SYNTHROID) 100 MCG tablet Take 100 mcg by mouth daily before breakfast. 09/27/22   [provider]  loperamide (IMODIUM) 2 MG capsule Take 2 mg by mouth in the morning.    [provider]  metoprolol succinate (TOPROL-XL) 50 MG 24 hr tablet Take 50 mg by mouth in the morning. 03/10/19   [provider]  Multiple Vitamins-Minerals (HAIR SKIN NAILS PO) Take 1 tablet by mouth in the morning.    [provider]  promethazine (PHENERGAN) 25 MG suppository Place 1 suppository (25 mg total) rectally every 6 (six) hours as needed for nausea or vomiting. 11/12/15   Lavera Guise, MD  simvastatin (ZOCOR) 10 MG tablet Take 10 mg by mouth at bedtime.    [provider]  sodium bicarbonate  650 MG tablet Take 650 mg by mouth in the morning and at bedtime. 02/04/21   [provider]  zinc sulfate 220 (50 Zn) MG capsule Take 220 mg by mouth in the morning.    [provider]    Family History Family History  Problem Relation Age of Onset   Breast cancer Mother    Emphysema Father    COPD Sister    Atrial fibrillation Sister    Obesity Brother    Cirrhosis Daughter    COPD Son    Diabetes Brother  Asthma Brother     Social History Social History   Tobacco Use   Smoking status: Never   Smokeless tobacco: Never  Vaping Use   Vaping status: Never Used  Substance Use Topics   Alcohol use: No    Alcohol/week: 0.0 standard drinks of alcohol   Drug use: No     Allergies   Fentanyl, Codeine, and Levaquin [levofloxacin in d5w]   Review of Systems Review of Systems Per HPI  Physical Exam Triage Vital Signs ED Triage Vitals  Encounter Vitals Group     BP 06/05/23 0953 (!) 153/70     Systolic BP Percentile --      Diastolic BP Percentile --      Pulse Rate 06/05/23 0953 83     Resp 06/05/23 0953 18     Temp 06/05/23 0953 98.1 F (36.7 C)     Temp Source 06/05/23 0953 Oral     SpO2 06/05/23 0953 91 %     Weight --      Height --      Head Circumference --      Peak Flow --      Pain Score 06/05/23 0958 0     Pain Loc --      Pain Education --      Exclude from Growth Chart --    No data found.  Updated Vital Signs BP (!) 153/70 (BP Location: Right Arm)   Pulse 83   Temp 98.1 F (36.7 C) (Oral)   Resp 18   SpO2 91%   Visual Acuity Right Eye Distance:   Left Eye Distance:   Bilateral Distance:    Right Eye Near:   Left Eye Near:    Bilateral Near:     Physical Exam Vitals and nursing note reviewed.  Constitutional:      General: She is not in acute distress.    Appearance: She is not toxic-appearing.  Pulmonary:     Effort: Pulmonary effort is normal. No respiratory distress.  Abdominal:     General: Abdomen is  flat. Bowel sounds are normal. There is no distension.     Palpations: Abdomen is soft. There is no mass.     Tenderness: There is no abdominal tenderness. There is no right CVA tenderness, left CVA tenderness or guarding.  Skin:    General: Skin is warm and dry.     Coloration: Skin is not jaundiced or pale.     Findings: No erythema.  Neurological:     Mental Status: She is alert and oriented to person, place, and time.     Motor: No weakness.     Gait: Gait normal.  Psychiatric:        Behavior: Behavior is cooperative.      UC Treatments / Results  Labs (all labs ordered are listed, but only abnormal results are displayed) Labs Reviewed  POCT URINALYSIS DIP (MANUAL ENTRY) - Abnormal; Notable for the following components:      Result Value   Clarity, UA hazy (*)    Blood, UA moderate (*)    pH, UA 8.5 (*)    Protein Ur, POC >=300 (*)    Leukocytes, UA Large (3+) (*)    All other components within normal limits  URINE CULTURE    EKG   Radiology No results found.  Procedures Procedures (including critical care time)  Medications Ordered in UC Medications - No data to display  Initial Impression / Assessment and Plan /  UC Course  I have reviewed the triage vital signs and the nursing notes.  Pertinent labs & imaging results that were available during my care of the patient were reviewed by me and considered in my medical decision making (see chart for details).   Patient is well-appearing, normotensive, afebrile, not tachycardic, not tachypneic, oxygenating well on room air.    1. Acute cystitis with hematuria Urinalysis today shows moderate blood, large leukocyte Estrace Suspect urine pH and protein baseline for patient given history of CKD, end-stage renal dialysis Urine sent for culture; in meantime treat with Keflex 500 mg twice daily for 5 days; dose based on kidney function I recommended patient take the Keflex after dialysis on the days that she has  dialysis and to try to space the medication out every 12 hours Strict ER precautions discussed with patient  The patient was given the opportunity to ask questions.  All questions answered to their satisfaction.  The patient is in agreement to this plan.    Final Clinical Impressions(s) / UC Diagnoses   Final diagnoses:  Acute cystitis with hematuria     Discharge Instructions      We are treating you today for urinary tract infection with Keflex.  On the days you have dialysis, take the Keflex after dialysis.  Try to space the medication out by 12 hours for each dose.  If your symptoms worsen and you develop fever, body aches or chills, nausea/vomiting and unable to keep fluids down, or severe pain in your stomach or back, please seek care in the emergency room.     ED Prescriptions     Medication Sig Dispense Auth. Provider   cephALEXin (KEFLEX) 500 MG capsule Take 1 capsule (500 mg total) by mouth 2 (two) times daily for 5 days. 10 capsule Valentino Nose, NP      PDMP not reviewed this encounter.   Valentino Nose, NP 06/05/23 1106

## 2023-06-07 LAB — URINE CULTURE: Culture: 50000 — AB

## 2023-06-11 ENCOUNTER — Emergency Department (HOSPITAL_COMMUNITY): Payer: PPO

## 2023-06-11 ENCOUNTER — Other Ambulatory Visit: Payer: Self-pay

## 2023-06-11 ENCOUNTER — Observation Stay (HOSPITAL_COMMUNITY)
Admission: EM | Admit: 2023-06-11 | Discharge: 2023-06-12 | Disposition: A | Discharge status: Home / Self Care | Payer: PPO | Attending: Family Medicine | Admitting: Family Medicine

## 2023-06-11 ENCOUNTER — Encounter (HOSPITAL_COMMUNITY): Payer: Self-pay

## 2023-06-11 DIAGNOSIS — I4581 Long QT syndrome: Secondary | ICD-10-CM | POA: Insufficient documentation

## 2023-06-11 DIAGNOSIS — A419 Sepsis, unspecified organism: Secondary | ICD-10-CM | POA: Insufficient documentation

## 2023-06-11 DIAGNOSIS — E876 Hypokalemia: Secondary | ICD-10-CM | POA: Diagnosis not present

## 2023-06-11 DIAGNOSIS — E1122 Type 2 diabetes mellitus with diabetic chronic kidney disease: Secondary | ICD-10-CM | POA: Diagnosis not present

## 2023-06-11 DIAGNOSIS — E119 Type 2 diabetes mellitus without complications: Secondary | ICD-10-CM

## 2023-06-11 DIAGNOSIS — J449 Chronic obstructive pulmonary disease, unspecified: Secondary | ICD-10-CM | POA: Diagnosis not present

## 2023-06-11 DIAGNOSIS — R55 Syncope and collapse: Secondary | ICD-10-CM | POA: Diagnosis not present

## 2023-06-11 DIAGNOSIS — N186 End stage renal disease: Secondary | ICD-10-CM | POA: Diagnosis not present

## 2023-06-11 DIAGNOSIS — I120 Hypertensive chronic kidney disease with stage 5 chronic kidney disease or end stage renal disease: Secondary | ICD-10-CM | POA: Diagnosis not present

## 2023-06-11 DIAGNOSIS — I959 Hypotension, unspecified: Secondary | ICD-10-CM | POA: Diagnosis not present

## 2023-06-11 DIAGNOSIS — Z79899 Other long term (current) drug therapy: Secondary | ICD-10-CM | POA: Insufficient documentation

## 2023-06-11 DIAGNOSIS — E039 Hypothyroidism, unspecified: Secondary | ICD-10-CM | POA: Diagnosis present

## 2023-06-11 DIAGNOSIS — J45909 Unspecified asthma, uncomplicated: Secondary | ICD-10-CM | POA: Diagnosis not present

## 2023-06-11 DIAGNOSIS — Z992 Dependence on renal dialysis: Secondary | ICD-10-CM | POA: Diagnosis not present

## 2023-06-11 DIAGNOSIS — I700 Atherosclerosis of aorta: Secondary | ICD-10-CM | POA: Diagnosis not present

## 2023-06-11 DIAGNOSIS — R9431 Abnormal electrocardiogram [ECG] [EKG]: Secondary | ICD-10-CM | POA: Insufficient documentation

## 2023-06-11 DIAGNOSIS — J9811 Atelectasis: Secondary | ICD-10-CM | POA: Diagnosis not present

## 2023-06-11 DIAGNOSIS — I10 Essential (primary) hypertension: Secondary | ICD-10-CM | POA: Diagnosis present

## 2023-06-11 LAB — CBC WITH DIFFERENTIAL/PLATELET
Abs Immature Granulocytes: 0.1 10*3/uL — ABNORMAL HIGH (ref 0.00–0.07)
Basophils Absolute: 0.1 10*3/uL (ref 0.0–0.1)
Basophils Relative: 1 %
Eosinophils Absolute: 0.2 10*3/uL (ref 0.0–0.5)
Eosinophils Relative: 1 %
HCT: 36.6 % (ref 36.0–46.0)
Hemoglobin: 11.1 g/dL — ABNORMAL LOW (ref 12.0–15.0)
Immature Granulocytes: 1 %
Lymphocytes Relative: 17 %
Lymphs Abs: 1.9 10*3/uL (ref 0.7–4.0)
MCH: 29.9 pg (ref 26.0–34.0)
MCHC: 30.3 g/dL (ref 30.0–36.0)
MCV: 98.7 fL (ref 80.0–100.0)
Monocytes Absolute: 1.3 10*3/uL — ABNORMAL HIGH (ref 0.1–1.0)
Monocytes Relative: 12 %
Neutro Abs: 7.2 10*3/uL (ref 1.7–7.7)
Neutrophils Relative %: 68 %
Platelets: 271 10*3/uL (ref 150–400)
RBC: 3.71 MIL/uL — ABNORMAL LOW (ref 3.87–5.11)
RDW: 14.4 % (ref 11.5–15.5)
WBC: 10.7 10*3/uL — ABNORMAL HIGH (ref 4.0–10.5)
nRBC: 0 % (ref 0.0–0.2)

## 2023-06-11 LAB — URINALYSIS, ROUTINE W REFLEX MICROSCOPIC
Bacteria, UA: NONE SEEN
Bilirubin Urine: NEGATIVE
Glucose, UA: NEGATIVE mg/dL
Hgb urine dipstick: NEGATIVE
Ketones, ur: NEGATIVE mg/dL
Nitrite: NEGATIVE
Protein, ur: 100 mg/dL — AB
Specific Gravity, Urine: 1.004 — ABNORMAL LOW (ref 1.005–1.030)
pH: 8 (ref 5.0–8.0)

## 2023-06-11 LAB — COMPREHENSIVE METABOLIC PANEL
ALT: 23 U/L (ref 0–44)
AST: 65 U/L — ABNORMAL HIGH (ref 15–41)
Albumin: 3.5 g/dL (ref 3.5–5.0)
Alkaline Phosphatase: 276 U/L — ABNORMAL HIGH (ref 38–126)
Anion gap: 10 (ref 5–15)
BUN: 9 mg/dL (ref 8–23)
CO2: 28 mmol/L (ref 22–32)
Calcium: 8.3 mg/dL — ABNORMAL LOW (ref 8.9–10.3)
Chloride: 96 mmol/L — ABNORMAL LOW (ref 98–111)
Creatinine, Ser: 1.64 mg/dL — ABNORMAL HIGH (ref 0.44–1.00)
GFR, Estimated: 31 mL/min — ABNORMAL LOW (ref >60)
Glucose, Bld: 186 mg/dL — ABNORMAL HIGH (ref 70–99)
Potassium: 2.9 mmol/L — ABNORMAL LOW (ref 3.5–5.1)
Sodium: 134 mmol/L — ABNORMAL LOW (ref 135–145)
Total Bilirubin: 0.7 mg/dL (ref 0.3–1.2)
Total Protein: 7.3 g/dL (ref 6.5–8.1)

## 2023-06-11 LAB — LACTIC ACID, PLASMA
Lactic Acid, Venous: 2.5 mmol/L (ref 0.5–1.9)
Lactic Acid, Venous: 1.5 mmol/L (ref 0.5–1.9)

## 2023-06-11 MED ORDER — VANCOMYCIN VARIABLE DOSE PER UNSTABLE RENAL FUNCTION (PHARMACIST DOSING): Status: DC

## 2023-06-11 MED ORDER — SODIUM CHLORIDE 0.9 % IV SOLN
2.0000 g | Freq: Once | INTRAVENOUS | Status: AC
  Administered 2023-06-11: 2 g via INTRAVENOUS
  Filled 2023-06-11: qty 12.5

## 2023-06-11 MED ORDER — SODIUM CHLORIDE 0.9 % IV SOLN
1.0000 g | INTRAVENOUS | Status: DC
  Filled 2023-06-11: qty 10

## 2023-06-11 MED ORDER — METRONIDAZOLE 500 MG/100ML IV SOLN
500.0000 mg | Freq: Once | INTRAVENOUS | Status: AC
  Administered 2023-06-11: 500 mg via INTRAVENOUS
  Filled 2023-06-11: qty 100

## 2023-06-11 MED ORDER — SODIUM CHLORIDE 0.9 % IV BOLUS
500.0000 mL | Freq: Once | INTRAVENOUS | Status: AC
  Administered 2023-06-11: 500 mL via INTRAVENOUS

## 2023-06-11 MED ORDER — LACTATED RINGERS IV SOLN: INTRAVENOUS | Status: DC

## 2023-06-11 MED ORDER — SODIUM CHLORIDE 0.9 % IV SOLN: INTRAVENOUS | Status: DC

## 2023-06-11 MED ORDER — VANCOMYCIN HCL IN DEXTROSE 1-5 GM/200ML-% IV SOLN
1000.0000 mg | Freq: Once | INTRAVENOUS | Status: AC
  Administered 2023-06-11: 1000 mg via INTRAVENOUS
  Filled 2023-06-11: qty 200

## 2023-06-11 NOTE — ED Notes (Signed)
Brought back to room in w/c. Alert, NAD, calm, interactive, resps e/u, speaking in clear complete sentences. Family at side.

## 2023-06-11 NOTE — ED Notes (Addendum)
Date and time results received: 06/11/23 1928   Test: Lactic Acid Critical Value: 2.5  Name of Provider Notified: Zackowski  Orders Received? Or Actions Taken?: NA

## 2023-06-11 NOTE — Sepsis Progress Note (Signed)
Notified provider of need to order antibiotics.   

## 2023-06-11 NOTE — ED Provider Notes (Signed)
[place] EMERGENCY DEPARTMENT AT [facility] Provider Note   CSN: [id number] Arrival date & time: 06/11/23  1649     History  Chief Complaint  Patient presents with   Code Sepsis   Weakness    Lindsay Cortez is a 82 y.o. female.  Patient is a dialysis patient.  Patient had dialysis earlier today.  Normally dialyzed Monday Wednesdays and Fridays.  Patient felt fine going into dialysis felt fine immediately after they went out to get dinner and she was not feeling good/she was going to pass out she was dizzy but no room spinning.  No chest pain no shortness of breath no abdominal pain no nausea no vomiting.  Patient has been receiving dialysis just for a few months.  She has an AV fistula left arm but they have not completely used that yet she has a catheter in the right anterior part of the chest.  Past medical history for hypertension diabetes COPD.  Past surgical history she has had abdominal hysterectomy.  Patient arrived here with blood pressure of 66 systolic.  Heart rate 126 temp 97.7 oxygen saturation is 91%.  Patient did eventually require to be placed on 2 L of oxygen here for borderline oxygen sats.  Patient recently completed a course of Keflex for urinary tract infection.  Culture showed that it would be sensitive to that.  The culture grew Klebsiella.  Diagnosed by urgent care on October 8.       Home Medications Prior to Admission medications   Medication Sig Start Date End Date Taking? Authorizing Provider  allopurinol (ZYLOPRIM) 100 MG tablet Take 200 mg by mouth in the morning.   Yes [provider]  amLODipine (NORVASC) 5 MG tablet Take 5 mg by mouth in the morning.   Yes [provider]  BREO ELLIPTA 100-25 MCG/ACT AEPB Inhale 1 puff into the lungs daily. 05/29/23  Yes [provider]  Calcium Carb-Cholecalciferol (CALCIUM 600 + D PO) Take 1 tablet by mouth every evening.   Yes [provider]  Cholecalciferol  (VITAMIN D) 2000 UNITS CAPS Take 2,000 Units by mouth every evening.   Yes [provider]  furosemide (LASIX) 20 MG tablet Take 40 mg by mouth daily. 03/25/21  Yes [provider]  levothyroxine (SYNTHROID) 100 MCG tablet Take 100 mcg by mouth daily before breakfast. 09/27/22  Yes [provider]  levothyroxine (SYNTHROID) 25 MCG tablet Take 25 mcg by mouth daily. 04/10/23  Yes [provider]  lidocaine-prilocaine (EMLA) cream Apply 1 Application topically once. 03/19/23  Yes [provider]  loperamide (IMODIUM) 2 MG capsule Take 6 mg by mouth in the morning.   Yes [provider]  metoprolol succinate (TOPROL-XL) 50 MG 24 hr tablet Take 50 mg by mouth in the morning. 03/10/19  Yes [provider]  mirtazapine (REMERON) 7.5 MG tablet Take 7.5 mg by mouth at bedtime. 05/22/23  Yes [provider]  Multiple Vitamins-Minerals (HAIR SKIN NAILS PO) Take 1 tablet by mouth in the morning.   Yes [provider]  simvastatin (ZOCOR) 10 MG tablet Take 10 mg by mouth at bedtime.   Yes [provider]  TRADJENTA 5 MG TABS tablet Take 5 mg by mouth. 03/05/23  Yes [provider]  zinc sulfate 220 (50 Zn) MG capsule Take 220 mg by mouth in the morning.   Yes [provider]      Allergies    Fentanyl, Codeine, Levaquin [levofloxacin in d5w],  and Levofloxacin    Review of Systems   Review of Systems  Constitutional:  Positive for fatigue. Negative for chills and fever.  HENT:  Negative for ear pain and sore throat.   Eyes:  Negative for pain and visual disturbance.  Respiratory:  Negative for cough and shortness of breath.   Cardiovascular:  Negative for chest pain and palpitations.  Gastrointestinal:  Negative for abdominal pain and vomiting.  Genitourinary:  Negative for dysuria and hematuria.  Musculoskeletal:  Negative for arthralgias and back pain.  Skin:  Negative for color change and rash.   Neurological:  Positive for light-headedness. Negative for seizures and syncope.  All other systems reviewed and are negative.   Physical Exam Updated Vital Signs BP (!) 128/54   Pulse 83   Temp 98.2 F (36.8 C)   Resp (!) 22   Ht 1.397 m (4\' 7" )   Wt 53.5 kg   SpO2 96%   BMI 27.43 kg/m  Physical Exam Vitals and nursing note reviewed.  Constitutional:      General: She is not in acute distress.    Appearance: Normal appearance. She is well-developed.  HENT:     Head: Normocephalic and atraumatic.     Mouth/Throat:     Mouth: Mucous membranes are dry.  Eyes:     Extraocular Movements: Extraocular movements intact.     Conjunctiva/sclera: Conjunctivae normal.     Pupils: Pupils are equal, round, and reactive to light.  Cardiovascular:     Rate and Rhythm: Normal rate and regular rhythm.     Heart sounds: No murmur heard. Pulmonary:     Effort: Pulmonary effort is normal. No respiratory distress.     Breath sounds: Normal breath sounds.  Abdominal:     Palpations: Abdomen is soft.     Tenderness: There is no abdominal tenderness.  Musculoskeletal:        General: No swelling.     Cervical back: Normal range of motion and neck supple.  Skin:    General: Skin is warm and dry.     Capillary Refill: Capillary refill takes less than 2 seconds.  Neurological:     General: No focal deficit present.     Mental Status: She is alert and oriented to person, place, and time.     Cranial Nerves: No cranial nerve deficit.     Sensory: No sensory deficit.     Motor: No weakness.  Psychiatric:        Mood and Affect: Mood normal.     ED Results / Procedures / Treatments   Labs (all labs ordered are listed, but only abnormal results are displayed) Labs Reviewed  CBC WITH DIFFERENTIAL/PLATELET - Abnormal; Notable for the following components:      Result Value   WBC 10.7 (*)    RBC 3.71 (*)    Hemoglobin 11.1 (*)    Monocytes Absolute 1.3 (*)    Abs Immature Granulocytes  0.10 (*)    All other components within normal limits  COMPREHENSIVE METABOLIC PANEL - Abnormal; Notable for the following components:   Sodium 134 (*)    Potassium 2.9 (*)    Chloride 96 (*)    Glucose, Bld 186 (*)    Creatinine, Ser 1.64 (*)    Calcium 8.3 (*)    AST 65 (*)    Alkaline Phosphatase 276 (*)    GFR, Estimated 31 (*)    All other components within normal limits  LACTIC ACID, PLASMA -  Abnormal; Notable for the following components:   Lactic Acid, Venous 2.5 (*)    All other components within normal limits  URINALYSIS, ROUTINE W REFLEX MICROSCOPIC - Abnormal; Notable for the following components:   Color, Urine STRAW (*)    Specific Gravity, Urine 1.004 (*)    Protein, ur 100 (*)    Leukocytes,Ua SMALL (*)    All other components within normal limits  CULTURE, BLOOD (ROUTINE X 2)  CULTURE, BLOOD (ROUTINE X 2)  LACTIC ACID, PLASMA    EKG EKG Interpretation Date/Time:  Monday June 11 2023 19:13:28 EDT Ventricular Rate:  76 PR Interval:  193 QRS Duration:  146 QT Interval:  477 QTC Calculation: 537 R Axis:   91  Text Interpretation: Sinus rhythm RBBB and LPFB No significant change since last tracing Confirmed by Vanetta Mulders [id number]) on 06/11/2023 7:24:26 PM  Radiology DG Chest Port 1 View  Result Date: 06/11/2023 CLINICAL DATA:  Sepsis EXAM: PORTABLE CHEST 1 VIEW COMPARISON:  06/17/2021 FINDINGS: A right central venous catheter is present with tip over the low SVC region. No pneumothorax. Shallow inspiration. Heart size and pulmonary vascularity are normal. Mild linear atelectasis in the lung bases. Lungs are otherwise clear. No pleural effusions. No pneumothorax. Mediastinal contours appear intact. Calcification of the aorta. Degenerative changes in the spine and shoulders. IMPRESSION: Mild linear atelectasis in the lung bases. No focal consolidation or airspace disease. Electronically Signed   By: Burman Nieves M.D.   On: 06/11/2023 22:00     Procedures Procedures    Medications Ordered in ED Medications  0.9 %  sodium chloride infusion (0 mLs Intravenous Stopped 06/11/23 2035)  lactated ringers infusion (has no administration in time range)  vancomycin (VANCOCIN) IVPB 1000 mg/200 mL premix (1,000 mg Intravenous New Bag/Given 06/11/23 2226)  ceFEPIme (MAXIPIME) 1 g in sodium chloride 0.9 % 100 mL IVPB (has no administration in time range)  vancomycin variable dose per unstable renal function (pharmacist dosing) (has no administration in time range)  sodium chloride 0.9 % bolus 500 mL (0 mLs Intravenous Stopped 06/11/23 2015)  ceFEPIme (MAXIPIME) 2 g in sodium chloride 0.9 % 100 mL IVPB (0 g Intravenous Stopped 06/11/23 2104)  metroNIDAZOLE (FLAGYL) IVPB 500 mg (0 mg Intravenous Stopped 06/11/23 2222)    ED Course/ Medical Decision Making/ A&P                                 Medical Decision Making Amount and/or Complexity of Data Reviewed Labs: ordered. Radiology: ordered.  Risk Prescription drug management. Decision regarding hospitalization.   Patient's blood pressures did come up nicely just with a little bit of IV fluids.  She is running like around 134 systolic now.  But did have that hypotensive presentation.  Patient's lactic acid elevated a little bit first 1 was 2.5 repeat is 1.5 patient's white blood cell count up some at 10.7.  Sepsis protocol was activated delay but did not give the full fluid challenge.  Patient's potassium a little low at 2.9 did not correct that because she is a dialysis patient.  Patient is abdomen soft and nontender.  Urinalysis negative.  White blood cell count was 6-10 no bacteria.  And patient had grown Klebsiella was sensitive to Keflex.  And no signs of any persistent urinary tract infection at this time.  That diagnosis was done by urgent care on October 8.  Patient was treated with broad-spectrum antibiotics for  unknown source.  Patient is now stabilized out.  But feels she  does need admission for monitoring overnight.  Because of the hypotension.   Final Clinical Impression(s) / ED Diagnoses Final diagnoses:  ESRD on dialysis (HCC)  Hypotension, unspecified hypotension type  Sepsis, due to unspecified organism, unspecified whether acute organ dysfunction present [facility])    Rx / DC Orders ED Discharge Orders     None         Vanetta Mulders, MD 06/11/23 2255

## 2023-06-11 NOTE — Sepsis Progress Note (Signed)
Pt not in room yet, no MD or RN assigned at this time

## 2023-06-11 NOTE — Progress Notes (Signed)
CODE SEPSIS - PHARMACY COMMUNICATION  **Broad Spectrum Antibiotics should be administered within 1 hour of Sepsis diagnosis**  Time Code Sepsis Called/Page Received: 10/14 @ 2020  Antibiotics Ordered: Cefepime, metronidazole, vancomycin  Time of 1st antibiotic administration: 2033  Additional action taken by pharmacy: N/A  If necessary, Name of Provider/Nurse Contacted: N/A    Merryl Hacker ,PharmD Clinical Pharmacist  06/11/2023  8:21 PM

## 2023-06-11 NOTE — Progress Notes (Signed)
Pharmacy Antibiotic Note  Lindsay Cortez is a 82 y.o. female admitted on 06/11/2023 with sepsis.  Pharmacy has been consulted for vancomycin and cefepime dosing.  Plan: Day 1 of antibiotics MD ordered vancomycin 1 g IV x 1 and cefepime 2 g IV x 1 Will hold off on ordering vancomycin maintenance dose until inpatient dialysis plan is determined Will initiate cefepime 1 g IV q24h (HD dosing) Patient is also on metronidazole 500 mg IV q12h Continue to monitor renal function and follow culture results   Height: 4\' 7"  (139.7 cm) Weight: 53.5 kg (118 lb) IBW/kg (Calculated) : 34  Temp (24hrs), Avg:97.7 F (36.5 C), Min:97.7 F (36.5 C), Max:97.7 F (36.5 C)  Recent Labs  Lab 06/11/23 1748  WBC 10.7*  CREATININE 1.64*  LATICACIDVEN 2.5*    Estimated Creatinine Clearance: 17.2 mL/min (A) (by C-G formula based on SCr of 1.64 mg/dL (H)).    Allergies  Allergen Reactions   Fentanyl Nausea And Vomiting   Codeine Nausea And Vomiting    Narcotic pain medicine makes her nauseated.  Says was told to only take tylenol due to kidney disease.   Levaquin [Levofloxacin In D5w] Nausea And Vomiting   Levofloxacin Nausea And Vomiting    Antimicrobials this admission: 10/14 cefepime >>  10/14 metronidazole >>  10/14 vanc>>  Dose adjustments this admission: None  Microbiology results: 10/14 BCx: IP   Thank you for allowing pharmacy to be a part of this patient's care.  Merryl Hacker, PharmD Clinical Pharmacist 06/11/2023 8:22 PM

## 2023-06-11 NOTE — ED Triage Notes (Signed)
Finished dialysis at 230pm Stated she got dizzy and does not feel well since tx.  Pt denies CP and SOB

## 2023-06-11 NOTE — Sepsis Progress Note (Signed)
Elink monitoring for the code sepsis protocol.  

## 2023-06-11 NOTE — Sepsis Progress Note (Signed)
Still waiting on ED room assignment, unable to track code sepsis at this time

## 2023-06-12 ENCOUNTER — Ambulatory Visit (HOSPITAL_COMMUNITY): Payer: PPO

## 2023-06-12 ENCOUNTER — Other Ambulatory Visit (HOSPITAL_COMMUNITY): Payer: Self-pay | Admitting: *Deleted

## 2023-06-12 DIAGNOSIS — I10 Essential (primary) hypertension: Secondary | ICD-10-CM

## 2023-06-12 DIAGNOSIS — R55 Syncope and collapse: Principal | ICD-10-CM

## 2023-06-12 DIAGNOSIS — I959 Hypotension, unspecified: Secondary | ICD-10-CM | POA: Insufficient documentation

## 2023-06-12 DIAGNOSIS — E876 Hypokalemia: Secondary | ICD-10-CM | POA: Insufficient documentation

## 2023-06-12 DIAGNOSIS — E039 Hypothyroidism, unspecified: Secondary | ICD-10-CM

## 2023-06-12 DIAGNOSIS — E861 Hypovolemia: Secondary | ICD-10-CM

## 2023-06-12 DIAGNOSIS — J449 Chronic obstructive pulmonary disease, unspecified: Secondary | ICD-10-CM

## 2023-06-12 DIAGNOSIS — R9431 Abnormal electrocardiogram [ECG] [EKG]: Secondary | ICD-10-CM | POA: Diagnosis not present

## 2023-06-12 DIAGNOSIS — E1165 Type 2 diabetes mellitus with hyperglycemia: Secondary | ICD-10-CM | POA: Diagnosis not present

## 2023-06-12 LAB — HEMOGLOBIN A1C
Hgb A1c MFr Bld: 5.1 % (ref 4.8–5.6)
Mean Plasma Glucose: 99.67 mg/dL

## 2023-06-12 LAB — CBC WITH DIFFERENTIAL/PLATELET
Abs Immature Granulocytes: 0.07 10*3/uL (ref 0.00–0.07)
Basophils Absolute: 0.1 10*3/uL (ref 0.0–0.1)
Basophils Relative: 1 %
Eosinophils Absolute: 0.2 10*3/uL (ref 0.0–0.5)
Eosinophils Relative: 2 %
HCT: 30.6 % — ABNORMAL LOW (ref 36.0–46.0)
Hemoglobin: 9.6 g/dL — ABNORMAL LOW (ref 12.0–15.0)
Immature Granulocytes: 1 %
Lymphocytes Relative: 24 %
Lymphs Abs: 2 10*3/uL (ref 0.7–4.0)
MCH: 30.4 pg (ref 26.0–34.0)
MCHC: 31.4 g/dL (ref 30.0–36.0)
MCV: 96.8 fL (ref 80.0–100.0)
Monocytes Absolute: 1.2 10*3/uL — ABNORMAL HIGH (ref 0.1–1.0)
Monocytes Relative: 15 %
Neutro Abs: 4.9 10*3/uL (ref 1.7–7.7)
Neutrophils Relative %: 57 %
Platelets: 213 10*3/uL (ref 150–400)
RBC: 3.16 MIL/uL — ABNORMAL LOW (ref 3.87–5.11)
RDW: 14.4 % (ref 11.5–15.5)
WBC: 8.3 10*3/uL (ref 4.0–10.5)
nRBC: 0 % (ref 0.0–0.2)

## 2023-06-12 LAB — CBG MONITORING, ED: Glucose-Capillary: 106 mg/dL — ABNORMAL HIGH (ref 70–99)

## 2023-06-12 LAB — COMPREHENSIVE METABOLIC PANEL
ALT: 26 U/L (ref 0–44)
AST: 68 U/L — ABNORMAL HIGH (ref 15–41)
Albumin: 3 g/dL — ABNORMAL LOW (ref 3.5–5.0)
Alkaline Phosphatase: 215 U/L — ABNORMAL HIGH (ref 38–126)
Anion gap: 10 (ref 5–15)
BUN: 12 mg/dL (ref 8–23)
CO2: 25 mmol/L (ref 22–32)
Calcium: 8 mg/dL — ABNORMAL LOW (ref 8.9–10.3)
Chloride: 102 mmol/L (ref 98–111)
Creatinine, Ser: 2.14 mg/dL — ABNORMAL HIGH (ref 0.44–1.00)
GFR, Estimated: 22 mL/min — ABNORMAL LOW (ref >60)
Glucose, Bld: 93 mg/dL (ref 70–99)
Potassium: 2.9 mmol/L — ABNORMAL LOW (ref 3.5–5.1)
Sodium: 137 mmol/L (ref 135–145)
Total Bilirubin: 0.6 mg/dL (ref 0.3–1.2)
Total Protein: 6 g/dL — ABNORMAL LOW (ref 6.5–8.1)

## 2023-06-12 LAB — PROCALCITONIN: Procalcitonin: 0.4 ng/mL

## 2023-06-12 LAB — MAGNESIUM: Magnesium: 1.8 mg/dL (ref 1.7–2.4)

## 2023-06-12 MED ORDER — INSULIN ASPART 100 UNIT/ML IJ SOLN: 0.0000 [IU] | Freq: Three times a day (TID) | INTRAMUSCULAR | Status: DC

## 2023-06-12 MED ORDER — SIMVASTATIN 10 MG PO TABS: 10.0000 mg | ORAL_TABLET | Freq: Every day | ORAL | Status: DC

## 2023-06-12 MED ORDER — ACETAMINOPHEN 325 MG PO TABS: 650.0000 mg | ORAL_TABLET | Freq: Four times a day (QID) | ORAL | Status: DC | PRN

## 2023-06-12 MED ORDER — ACETAMINOPHEN 650 MG RE SUPP: 650.0000 mg | Freq: Four times a day (QID) | RECTAL | Status: DC | PRN

## 2023-06-12 MED ORDER — ONDANSETRON HCL 4 MG/2ML IJ SOLN: 4.0000 mg | Freq: Four times a day (QID) | INTRAMUSCULAR | Status: DC | PRN

## 2023-06-12 MED ORDER — FLUTICASONE FUROATE-VILANTEROL 100-25 MCG/ACT IN AEPB
1.0000 | INHALATION_SPRAY | Freq: Every day | RESPIRATORY_TRACT | Status: DC
  Administered 2023-06-12: 1 via RESPIRATORY_TRACT
  Filled 2023-06-12: qty 28

## 2023-06-12 MED ORDER — HEPARIN SODIUM (PORCINE) 5000 UNIT/ML IJ SOLN
5000.0000 [IU] | Freq: Three times a day (TID) | INTRAMUSCULAR | Status: DC
  Administered 2023-06-12: 5000 [IU] via SUBCUTANEOUS
  Filled 2023-06-12: qty 1

## 2023-06-12 MED ORDER — OXYCODONE HCL 5 MG PO TABS: 5.0000 mg | ORAL_TABLET | ORAL | Status: DC | PRN

## 2023-06-12 MED ORDER — LEVOTHYROXINE SODIUM 50 MCG PO TABS: 100.0000 ug | ORAL_TABLET | Freq: Every day | ORAL | Status: DC

## 2023-06-12 MED ORDER — INSULIN ASPART 100 UNIT/ML IJ SOLN: 0.0000 [IU] | Freq: Every day | INTRAMUSCULAR | Status: DC

## 2023-06-12 MED ORDER — MIRTAZAPINE 15 MG PO TABS: 7.5000 mg | ORAL_TABLET | Freq: Every day | ORAL | Status: DC

## 2023-06-12 MED ORDER — POTASSIUM CHLORIDE CRYS ER 20 MEQ PO TBCR
40.0000 meq | EXTENDED_RELEASE_TABLET | Freq: Once | ORAL | Status: AC
  Administered 2023-06-12: 40 meq via ORAL
  Filled 2023-06-12: qty 2

## 2023-06-12 MED ORDER — ONDANSETRON HCL 4 MG PO TABS: 4.0000 mg | ORAL_TABLET | Freq: Four times a day (QID) | ORAL | Status: DC | PRN

## 2023-06-12 MED ORDER — LEVOTHYROXINE SODIUM 50 MCG PO TABS
100.0000 ug | ORAL_TABLET | Freq: Every day | ORAL | Status: DC
  Administered 2023-06-12: 100 ug via ORAL
  Filled 2023-06-12: qty 2

## 2023-06-12 MED ORDER — MORPHINE SULFATE (PF) 2 MG/ML IV SOLN: 2.0000 mg | INTRAVENOUS | Status: DC | PRN

## 2023-06-12 NOTE — Assessment & Plan Note (Signed)
-   Holding Norvasc, Lasix, metoprolol - Continue gentle IV fluids - Consult nephrology for volume management - Continue to monitor

## 2023-06-12 NOTE — ED Notes (Signed)
Pt given meal tray and eating at this time. No needs expressed at this time

## 2023-06-12 NOTE — Assessment & Plan Note (Signed)
-  Sliding scale coverage - Monitor CBGs

## 2023-06-12 NOTE — Assessment & Plan Note (Signed)
Continue Breo

## 2023-06-12 NOTE — Discharge Instructions (Addendum)
PLEASE HOLD BLOOD PRESSURE MEDS ON DIALYSIS DAYS DUE TO LOW BLOOD PRESSURES AND TALK WITH YOUR NEPHROLOGIST ABOUT IT.      IMPORTANT INFORMATION: PAY CLOSE ATTENTION   PHYSICIAN DISCHARGE INSTRUCTIONS  Follow with Primary care provider  Elfredia Nevins, MD  and other consultants as instructed by your Hospitalist Physician  SEEK MEDICAL CARE OR RETURN TO EMERGENCY ROOM IF SYMPTOMS COME BACK, WORSEN OR NEW PROBLEM DEVELOPS   Please note: You were cared for by a hospitalist during your hospital stay. Every effort will be made to forward records to your primary care provider.  You can request that your primary care provider send for your hospital records if they have not received them.  Once you are discharged, your primary care physician will handle any further medical issues. Please note that NO REFILLS for any discharge medications will be authorized once you are discharged, as it is imperative that you return to your primary care physician (or establish a relationship with a primary care physician if you do not have one) for your post hospital discharge needs so that they can reassess your need for medications and monitor your lab values.  Please get a complete blood count and chemistry panel checked by your Primary MD at your next visit, and again as instructed by your Primary MD.  Get Medicines reviewed and adjusted: Please take all your medications with you for your next visit with your Primary MD  Laboratory/radiological data: Please request your Primary MD to go over all hospital tests and procedure/radiological results at the follow up, please ask your primary care provider to get all Hospital records sent to his/her office.  In some cases, they will be blood work, cultures and biopsy results pending at the time of your discharge. Please request that your primary care provider follow up on these results.  If you are diabetic, please bring your blood sugar readings with you to your  follow up appointment with primary care.    Please call and make your follow up appointments as soon as possible.    Also Note the following: If you experience worsening of your admission symptoms, develop shortness of breath, life threatening emergency, suicidal or homicidal thoughts you must seek medical attention immediately by calling 911 or calling your MD immediately  if symptoms less severe.  You must read complete instructions/literature along with all the possible adverse reactions/side effects for all the Medicines you take and that have been prescribed to you. Take any new Medicines after you have completely understood and accpet all the possible adverse reactions/side effects.   Do not drive when taking Pain medications or sleeping medications (Benzodiazepines)  Do not take more than prescribed Pain, Sleep and Anxiety Medications. It is not advisable to combine anxiety,sleep and pain medications without talking with your primary care practitioner  Special Instructions: If you have smoked or chewed Tobacco  in the last 2 yrs please stop smoking, stop any regular Alcohol  and or any Recreational drug use.  Wear Seat belts while driving.  Do not drive if taking any narcotic, mind altering or controlled substances or recreational drugs or alcohol.

## 2023-06-12 NOTE — Assessment & Plan Note (Signed)
-   Hold QT prolonging agents when possible - Likely related to hypokalemia - Defer to nephrology for electrolyte management

## 2023-06-12 NOTE — H&P (Signed)
History and Physical    Patient: Lindsay Cortez WGN:[id number] DOB: 05-16-1941 DOA: 06/11/2023 DOS: the patient was seen and examined on 06/12/2023 PCP: Elfredia Nevins, MD  Patient coming from: Home  Chief Complaint:  Chief Complaint  Patient presents with   Code Sepsis   Weakness   HPI: Lindsay Cortez is a 82 y.o. female with medical history significant of ESRD on HD, COPD, diabetes mellitus, hypertension, hypothyroidism, and more presents the ED with a chief complaint of near syncope.  Patient reports that she went to dialysis and she felt fine.  She then went out to eat after dialysis and felt fine.  Then while riding in the car she suddenly became very lightheaded.  Her sister took her to urgent care, and they advised her to come to the ER.  She had no loss of consciousness.  She did not collapse to the ground.  She had no nausea or vomiting.  She reports that she does not normally have any reaction after by dialysis.  The last time she took her blood pressure medications was the morning of dialysis.  She does report that she has had no appetite recently.  Her PCP started her on Remeron to try to stimulate her appetite.  She just started dialysis about 6 weeks ago, so she is relatively new to the whole thing.  She reports she does still make urine 2-3 times per day.  She has had some dyspnea on exertion, but nothing that has alarmed her.  She has not had any cough or chest pain.  She does not wear oxygen at home.  She has no other complaints at this time.  Patient does not smoke and does not drink.  Patient is DNR. Review of Systems: As mentioned in the history of present illness. All other systems reviewed and are negative. Past Medical History:  Diagnosis Date   Arthritis    Asthma    Back pain    COPD (chronic obstructive pulmonary disease) (HCC)    Diabetes (HCC) 09/01/2014   Diabetes mellitus    x 15 yrs   Gout    Hypertension    Kidney stones    Pancreatic cyst     Pneumonia    Renal disorder    Renal insufficiency    Shoulder dislocation    Vertigo    Past Surgical History:  Procedure Laterality Date   ABDOMINAL HYSTERECTOMY     AV FISTULA PLACEMENT Left 11/21/2022   Procedure: LEFT ARM ARTERIOVENOUS (AV) FISTULA CREATION;  Surgeon: Larina Earthly, MD;  Location: [facility];  Service: Vascular;  Laterality: Left;   BLADDER SURGERY     bladder tac   BREAST SURGERY Left    benign   FOOT SURGERY Bilateral    bunions   Social History:  reports that she has never smoked. She has never used smokeless tobacco. She reports that she does not drink alcohol and does not use drugs.  Allergies  Allergen Reactions   Fentanyl Nausea And Vomiting   Codeine Nausea And Vomiting    Narcotic pain medicine makes her nauseated.  Says was told to only take tylenol due to kidney disease.   Levaquin [Levofloxacin In D5w] Nausea And Vomiting   Levofloxacin Nausea And Vomiting    Family History  Problem Relation Age of Onset   Breast cancer Mother    Emphysema Father    COPD Sister    Atrial fibrillation Sister    Obesity Brother    Cirrhosis  Daughter    COPD Son    Diabetes Brother    Asthma Brother     Prior to Admission medications   Medication Sig Start Date End Date Taking? Authorizing Provider  allopurinol (ZYLOPRIM) 100 MG tablet Take 200 mg by mouth in the morning.   Yes [provider]  amLODipine (NORVASC) 5 MG tablet Take 5 mg by mouth in the morning.   Yes [provider]  BREO ELLIPTA 100-25 MCG/ACT AEPB Inhale 1 puff into the lungs daily. 05/29/23  Yes [provider]  Calcium Carb-Cholecalciferol (CALCIUM 600 + D PO) Take 1 tablet by mouth every evening.   Yes [provider]  Cholecalciferol (VITAMIN D) 2000 UNITS CAPS Take 2,000 Units by mouth every evening.   Yes [provider]  furosemide (LASIX) 20 MG tablet Take 40 mg by mouth daily. 03/25/21  Yes [provider]  levothyroxine  (SYNTHROID) 100 MCG tablet Take 100 mcg by mouth daily before breakfast. 09/27/22  Yes [provider]  levothyroxine (SYNTHROID) 25 MCG tablet Take 25 mcg by mouth daily. 04/10/23  Yes [provider]  lidocaine-prilocaine (EMLA) cream Apply 1 Application topically once. 03/19/23  Yes [provider]  loperamide (IMODIUM) 2 MG capsule Take 6 mg by mouth in the morning.   Yes [provider]  metoprolol succinate (TOPROL-XL) 50 MG 24 hr tablet Take 50 mg by mouth in the morning. 03/10/19  Yes [provider]  mirtazapine (REMERON) 7.5 MG tablet Take 7.5 mg by mouth at bedtime. 05/22/23  Yes [provider]  Multiple Vitamins-Minerals (HAIR SKIN NAILS PO) Take 1 tablet by mouth in the morning.   Yes [provider]  simvastatin (ZOCOR) 10 MG tablet Take 10 mg by mouth at bedtime.   Yes [provider]  TRADJENTA 5 MG TABS tablet Take 5 mg by mouth. 03/05/23  Yes [provider]  zinc sulfate 220 (50 Zn) MG capsule Take 220 mg by mouth in the morning.   Yes [provider]    Physical Exam: Vitals:   06/11/23 2145 06/11/23 2330 06/12/23 0045 06/12/23 0400  BP: (!) 128/54 (!) 137/57 121/80 (!) 136/56  Pulse: 83 82 79 76  Resp: (!) 22 (!) 22 14 18   Temp:      TempSrc:      SpO2: 96% 93% 91% 91%  Weight:      Height:       1.  General: Patient lying supine in bed,  no acute distress   2. Psychiatric: Alert and oriented x 3, mood and behavior normal for situation, pleasant and cooperative with exam   3. Neurologic: Speech and language are normal, face is symmetric, moves all 4 extremities voluntarily, at baseline without acute deficits on limited exam   4. HEENMT:  Head is atraumatic, normocephalic, pupils reactive to light, neck is supple, trachea is midline, mucous membranes are moist   5. Respiratory : Lungs are clear to auscultation bilaterally without wheezing, rhonchi, rales, no cyanosis, no  increase in work of breathing or accessory muscle use   6. Cardiovascular : Heart rate normal, rhythm is regular, no murmurs, rubs or gallops, no peripheral edema, peripheral pulses palpated   7. Gastrointestinal:  Abdomen is soft, nondistended, nontender to palpation bowel sounds active, no masses or organomegaly palpated   8. Skin:  Skin is warm, dry and intact without rashes, acute lesions, or ulcers on limited exam   9.Musculoskeletal:  No acute deformities or trauma, no asymmetry  in tone, no peripheral edema, peripheral pulses palpated, no tenderness to palpation in the extremities  Data Reviewed: In the ED Temp 97.7, heart rate 74-126, respiratory rate 15-24, blood pressure 66/48-137/62 Patient was started on broad-spectrum antibiotics vancomycin and cefepime Procalcitonin is not indicative of a bacterial infection No leukocytosis, hemoglobin stable Chemistry reveals a hyponatremia, hypokalemia, hyperglycemia Patient initially had a lactic acid of 2.5 but after gentle fluids she had a lactic acid of 1.5 UA is not convincing of UTI On October 8 she did have Klebsiella cultures that was resistant to ampicillin and Zosyn, she was treated for UTI with Keflex Admission was requested for near syncope  Assessment and Plan: * Near syncope - Most likely secondary to hypotension - When patient arrived her systolic blood pressure was 66 - Improved after a small fluid bolus - Patient also had associated lactic acidosis expected with this kind of hypotension - Echo in the a.m. - It is possible the patient was just over dialyzed - Continue gentle IV hydration - Like to be discharged 10/15  Prolonged QT interval - Hold QT prolonging agents when possible - Likely related to hypokalemia - Defer to nephrology for electrolyte management  Hypokalemia - Defer to nephrology for electrolyte management  Hypotension - Holding Norvasc, Lasix, metoprolol - Continue gentle IV fluids -  Consult nephrology for volume management - Continue to monitor  Essential hypertension - Holding Norvasc, metoprolol in the setting of hypotension  Hypothyroidism - Continue Synthroid  Diabetes mellitus (HCC) - Sliding scale coverage - Monitor CBGs  COPD (chronic obstructive pulmonary disease) (HCC) - Continue Breo      Advance Care Planning:   Code Status: Limited: Do not attempt resuscitation (DNR) -DNR-LIMITED -Do Not Intubate/DNI   Consults: Nephro J for dialysis and electrolyte management  Family Communication: No family at bedside  Severity of Illness: The appropriate patient status for this patient is OBSERVATION. Observation status is judged to be reasonable and necessary in order to provide the required intensity of service to ensure the patient's safety. The patient's presenting symptoms, physical exam findings, and initial radiographic and laboratory data in the context of their medical condition is felt to place them at decreased risk for further clinical deterioration. Furthermore, it is anticipated that the patient will be medically stable for discharge from the hospital within 2 midnights of admission.   Author: Lilyan Gilford, DO 06/12/2023 5:54 AM  For on call review [email or web address].

## 2023-06-12 NOTE — Assessment & Plan Note (Signed)
-   Defer to nephrology for electrolyte management

## 2023-06-12 NOTE — Progress Notes (Signed)
Transition of Care Department [facility]) has reviewed patient and no TOC needs have been identified at this time. We will continue to monitor patient advancement through interdisciplinary progression rounds. If new patient transition needs arise, please place a TOC consult.   06/12/23 0848  TOC Brief Assessment  Insurance and Status Reviewed  Patient has primary care physician Yes  Home environment has been reviewed Lives alone.  Prior level of function: Independent.  Prior/Current Home Services No current home services  Social Determinants of Health Reivew SDOH reviewed no interventions necessary  Readmission risk has been reviewed Yes  Transition of care needs no transition of care needs at this time

## 2023-06-12 NOTE — Discharge Summary (Addendum)
by mouth daily.   lidocaine-prilocaine cream Commonly known as: EMLA Apply 1 Application topically once.   loperamide 2 MG capsule Commonly known as: IMODIUM Take 6 mg by mouth in the morning.   metoprolol succinate 50 MG 24 hr tablet Commonly known as: TOPROL-XL Take 50 mg by mouth in the morning.   mirtazapine 7.5 MG tablet Commonly known as: REMERON Take 7.5 mg by mouth at bedtime.   simvastatin 10 MG tablet Commonly known as: ZOCOR Take 10 mg by mouth at bedtime.   Tradjenta 5 MG Tabs tablet Generic drug: linagliptin Take 5 mg by mouth.   Vitamin D 50 MCG (2000 UT) Caps Take 2,000 Units by mouth every evening.   zinc sulfate 220 (50 Zn) MG capsule Take 220 mg by mouth in the morning.        Procedures/Studies: DG Chest Port 1 View  Result Date: 06/11/2023 CLINICAL DATA:  Sepsis EXAM: PORTABLE CHEST 1 VIEW COMPARISON:  06/17/2021 FINDINGS: A right central venous catheter is present with tip over the low SVC region. No pneumothorax. Shallow inspiration. Heart size and pulmonary vascularity are normal. Mild linear atelectasis in the lung bases. Lungs are otherwise clear. No pleural effusions. No pneumothorax. Mediastinal contours appear intact. Calcification of the aorta. Degenerative changes in the spine and shoulders. IMPRESSION: Mild linear atelectasis in the lung bases. No focal consolidation or airspace disease. Electronically Signed   By: Burman Nieves M.D.   On:  06/11/2023 22:00   VAS US DUPLEX DIALYSIS ACCESS (AVF, AVG)  Result Date: 05/22/2023 DIALYSIS ACCESS Patient Name:  Lindsay Cortez  Date of Exam:   05/22/2023 Medical Rec #: [id number]        Accession #:    [id number] Date of Birth: Sep 11, 1940       Patient Gender: F Patient Age:   82 years Exam Location:  [facility] Procedure:      VAS US DUPLEX DIALYSIS ACCESS (AVF, AVG) Referring Phys: EMMA COLLINS --------------------------------------------------------------------------------  Reason for Exam: Bleeding during dialysis. Used twice. Access Site: Left Upper Extremity. Access Type: Brachial-cephalic AVF. History: Created on 11/21/22. Performing Technologist: Thereasa Parkin RVT  Examination Guidelines: A complete evaluation includes B-mode imaging, spectral Doppler, color Doppler, and power Doppler as needed of all accessible portions of each vessel. Unilateral testing is considered an integral part of a complete examination. Limited examinations for reoccurring indications may be performed as noted.  Findings: +--------------------+----------+-----------------+--------+ AVF                 PSV (cm/s)Flow Vol (mL/min)Comments +--------------------+----------+-----------------+--------+ Native artery inflow   299          1624                +--------------------+----------+-----------------+--------+ AVF Anastomosis        246                              +--------------------+----------+-----------------+--------+  +------------+----------+-------------+----------+-----------------------------+ OUTFLOW VEINPSV (cm/s)Diameter (cm)Depth (cm)          Describe            +------------+----------+-------------+----------+-----------------------------+ Prox UA        101        0.84        0.56                                 +------------+----------+-------------+----------+-----------------------------+ Mid  by mouth daily.   lidocaine-prilocaine cream Commonly known as: EMLA Apply 1 Application topically once.   loperamide 2 MG capsule Commonly known as: IMODIUM Take 6 mg by mouth in the morning.   metoprolol succinate 50 MG 24 hr tablet Commonly known as: TOPROL-XL Take 50 mg by mouth in the morning.   mirtazapine 7.5 MG tablet Commonly known as: REMERON Take 7.5 mg by mouth at bedtime.   simvastatin 10 MG tablet Commonly known as: ZOCOR Take 10 mg by mouth at bedtime.   Tradjenta 5 MG Tabs tablet Generic drug: linagliptin Take 5 mg by mouth.   Vitamin D 50 MCG (2000 UT) Caps Take 2,000 Units by mouth every evening.   zinc sulfate 220 (50 Zn) MG capsule Take 220 mg by mouth in the morning.        Procedures/Studies: DG Chest Port 1 View  Result Date: 06/11/2023 CLINICAL DATA:  Sepsis EXAM: PORTABLE CHEST 1 VIEW COMPARISON:  06/17/2021 FINDINGS: A right central venous catheter is present with tip over the low SVC region. No pneumothorax. Shallow inspiration. Heart size and pulmonary vascularity are normal. Mild linear atelectasis in the lung bases. Lungs are otherwise clear. No pleural effusions. No pneumothorax. Mediastinal contours appear intact. Calcification of the aorta. Degenerative changes in the spine and shoulders. IMPRESSION: Mild linear atelectasis in the lung bases. No focal consolidation or airspace disease. Electronically Signed   By: Burman Nieves M.D.   On:  06/11/2023 22:00   VAS US DUPLEX DIALYSIS ACCESS (AVF, AVG)  Result Date: 05/22/2023 DIALYSIS ACCESS Patient Name:  Lindsay Cortez  Date of Exam:   05/22/2023 Medical Rec #: [id number]        Accession #:    [id number] Date of Birth: Sep 11, 1940       Patient Gender: F Patient Age:   82 years Exam Location:  [facility] Procedure:      VAS US DUPLEX DIALYSIS ACCESS (AVF, AVG) Referring Phys: EMMA COLLINS --------------------------------------------------------------------------------  Reason for Exam: Bleeding during dialysis. Used twice. Access Site: Left Upper Extremity. Access Type: Brachial-cephalic AVF. History: Created on 11/21/22. Performing Technologist: Thereasa Parkin RVT  Examination Guidelines: A complete evaluation includes B-mode imaging, spectral Doppler, color Doppler, and power Doppler as needed of all accessible portions of each vessel. Unilateral testing is considered an integral part of a complete examination. Limited examinations for reoccurring indications may be performed as noted.  Findings: +--------------------+----------+-----------------+--------+ AVF                 PSV (cm/s)Flow Vol (mL/min)Comments +--------------------+----------+-----------------+--------+ Native artery inflow   299          1624                +--------------------+----------+-----------------+--------+ AVF Anastomosis        246                              +--------------------+----------+-----------------+--------+  +------------+----------+-------------+----------+-----------------------------+ OUTFLOW VEINPSV (cm/s)Diameter (cm)Depth (cm)          Describe            +------------+----------+-------------+----------+-----------------------------+ Prox UA        101        0.84        0.56                                 +------------+----------+-------------+----------+-----------------------------+ Mid  by mouth daily.   lidocaine-prilocaine cream Commonly known as: EMLA Apply 1 Application topically once.   loperamide 2 MG capsule Commonly known as: IMODIUM Take 6 mg by mouth in the morning.   metoprolol succinate 50 MG 24 hr tablet Commonly known as: TOPROL-XL Take 50 mg by mouth in the morning.   mirtazapine 7.5 MG tablet Commonly known as: REMERON Take 7.5 mg by mouth at bedtime.   simvastatin 10 MG tablet Commonly known as: ZOCOR Take 10 mg by mouth at bedtime.   Tradjenta 5 MG Tabs tablet Generic drug: linagliptin Take 5 mg by mouth.   Vitamin D 50 MCG (2000 UT) Caps Take 2,000 Units by mouth every evening.   zinc sulfate 220 (50 Zn) MG capsule Take 220 mg by mouth in the morning.        Procedures/Studies: DG Chest Port 1 View  Result Date: 06/11/2023 CLINICAL DATA:  Sepsis EXAM: PORTABLE CHEST 1 VIEW COMPARISON:  06/17/2021 FINDINGS: A right central venous catheter is present with tip over the low SVC region. No pneumothorax. Shallow inspiration. Heart size and pulmonary vascularity are normal. Mild linear atelectasis in the lung bases. Lungs are otherwise clear. No pleural effusions. No pneumothorax. Mediastinal contours appear intact. Calcification of the aorta. Degenerative changes in the spine and shoulders. IMPRESSION: Mild linear atelectasis in the lung bases. No focal consolidation or airspace disease. Electronically Signed   By: Burman Nieves M.D.   On:  06/11/2023 22:00   VAS US DUPLEX DIALYSIS ACCESS (AVF, AVG)  Result Date: 05/22/2023 DIALYSIS ACCESS Patient Name:  Lindsay Cortez  Date of Exam:   05/22/2023 Medical Rec #: [id number]        Accession #:    [id number] Date of Birth: Sep 11, 1940       Patient Gender: F Patient Age:   82 years Exam Location:  [facility] Procedure:      VAS US DUPLEX DIALYSIS ACCESS (AVF, AVG) Referring Phys: EMMA COLLINS --------------------------------------------------------------------------------  Reason for Exam: Bleeding during dialysis. Used twice. Access Site: Left Upper Extremity. Access Type: Brachial-cephalic AVF. History: Created on 11/21/22. Performing Technologist: Thereasa Parkin RVT  Examination Guidelines: A complete evaluation includes B-mode imaging, spectral Doppler, color Doppler, and power Doppler as needed of all accessible portions of each vessel. Unilateral testing is considered an integral part of a complete examination. Limited examinations for reoccurring indications may be performed as noted.  Findings: +--------------------+----------+-----------------+--------+ AVF                 PSV (cm/s)Flow Vol (mL/min)Comments +--------------------+----------+-----------------+--------+ Native artery inflow   299          1624                +--------------------+----------+-----------------+--------+ AVF Anastomosis        246                              +--------------------+----------+-----------------+--------+  +------------+----------+-------------+----------+-----------------------------+ OUTFLOW VEINPSV (cm/s)Diameter (cm)Depth (cm)          Describe            +------------+----------+-------------+----------+-----------------------------+ Prox UA        101        0.84        0.56                                 +------------+----------+-------------+----------+-----------------------------+ Mid  Physician Discharge Summary  CHANNEL PAPANDREA ZOX:[id number] DOB: 04/25/1941 DOA: 06/11/2023  PCP: Elfredia Nevins, MD Nephrologist: Dr. Wolfgang Phoenix   Admit date: 06/11/2023 Discharge date: 06/12/2023  Admitted From:  Home  Disposition:  Home   Recommendations for Outpatient Follow-up:  Follow up with PCP in 1 weeks Resume regular HD schedule tomorrow Please hold BP meds on dialysis day to avoid hypotension and discuss with your nephrologist   Discharge Condition: STABLE   CODE STATUS: DNR DIET: renal / fluid restricted     Brief Hospitalization Summary: Please see all hospital notes, images, labs for full details of the hospitalization. Admission provider HPI:   82 y.o. female with medical history significant of ESRD on HD, COPD, diabetes mellitus, hypertension, hypothyroidism, and more presents the ED with a chief complaint of near syncope.  Patient reports that she went to dialysis and she felt fine.  She then went out to eat after dialysis and felt fine.  Then while riding in the car she suddenly became very lightheaded.  Her sister took her to urgent care, and they advised her to come to the ER.  She had no loss of consciousness.  She did not collapse to the ground.  She had no nausea or vomiting.  She reports that she does not normally have any reaction after by dialysis.  The last time she took her blood pressure medications was the morning of dialysis.  She does report that she has had no appetite recently.  Her PCP started her on Remeron to try to stimulate her appetite.  She just started dialysis about 6 weeks ago, so she is relatively new to the whole thing.  She reports she does still make urine 2-3 times per day.  She has had some dyspnea on exertion, but nothing that has alarmed her.  She has not had any cough or chest pain.  She does not wear oxygen at home.  She has no other complaints at this time.   Hospital Course   Pt was placed in observation for having an episode of severe  hypotension after HD treatment.  Pt says that she had taken her BP meds on HD day and after HD she had severe episode feeling like she nearly passed out.  She was seen in ED and given a fluid bolus and her BPs have rebounded and she is feeling much better.  We monitored her for several  hours and she had no recurrence of symptoms.  No signs of infection found and she was advised that she should hold BP meds on dialysis days and discuss further with her nephrologist Dr. Wolfgang Phoenix.  She was given 1 dose of oral potassium 40 meQ for hypokalemia.  She verbalized understanding.  She is feeling well.  Her vital signs are stable and she is discharged home is stable condition in the care of her sister.    Discharge Diagnoses:  Principal Problem:   Near syncope Active Problems:   COPD (chronic obstructive pulmonary disease) (HCC)   Diabetes mellitus (HCC)   Hypothyroidism   Essential hypertension   Hypotension   Hypokalemia   Prolonged QT interval   Discharge Instructions:  Allergies as of 06/12/2023       Reactions   Fentanyl Nausea And Vomiting   Codeine Nausea And Vomiting   Narcotic pain medicine makes her nauseated.  Says was told to only take tylenol due to kidney disease.   Levaquin [levofloxacin In D5w] Nausea And Vomiting   Levofloxacin Nausea And Vomiting  by mouth daily.   lidocaine-prilocaine cream Commonly known as: EMLA Apply 1 Application topically once.   loperamide 2 MG capsule Commonly known as: IMODIUM Take 6 mg by mouth in the morning.   metoprolol succinate 50 MG 24 hr tablet Commonly known as: TOPROL-XL Take 50 mg by mouth in the morning.   mirtazapine 7.5 MG tablet Commonly known as: REMERON Take 7.5 mg by mouth at bedtime.   simvastatin 10 MG tablet Commonly known as: ZOCOR Take 10 mg by mouth at bedtime.   Tradjenta 5 MG Tabs tablet Generic drug: linagliptin Take 5 mg by mouth.   Vitamin D 50 MCG (2000 UT) Caps Take 2,000 Units by mouth every evening.   zinc sulfate 220 (50 Zn) MG capsule Take 220 mg by mouth in the morning.        Procedures/Studies: DG Chest Port 1 View  Result Date: 06/11/2023 CLINICAL DATA:  Sepsis EXAM: PORTABLE CHEST 1 VIEW COMPARISON:  06/17/2021 FINDINGS: A right central venous catheter is present with tip over the low SVC region. No pneumothorax. Shallow inspiration. Heart size and pulmonary vascularity are normal. Mild linear atelectasis in the lung bases. Lungs are otherwise clear. No pleural effusions. No pneumothorax. Mediastinal contours appear intact. Calcification of the aorta. Degenerative changes in the spine and shoulders. IMPRESSION: Mild linear atelectasis in the lung bases. No focal consolidation or airspace disease. Electronically Signed   By: Burman Nieves M.D.   On:  06/11/2023 22:00   VAS US DUPLEX DIALYSIS ACCESS (AVF, AVG)  Result Date: 05/22/2023 DIALYSIS ACCESS Patient Name:  Lindsay Cortez  Date of Exam:   05/22/2023 Medical Rec #: [id number]        Accession #:    [id number] Date of Birth: Sep 11, 1940       Patient Gender: F Patient Age:   82 years Exam Location:  [facility] Procedure:      VAS US DUPLEX DIALYSIS ACCESS (AVF, AVG) Referring Phys: EMMA COLLINS --------------------------------------------------------------------------------  Reason for Exam: Bleeding during dialysis. Used twice. Access Site: Left Upper Extremity. Access Type: Brachial-cephalic AVF. History: Created on 11/21/22. Performing Technologist: Thereasa Parkin RVT  Examination Guidelines: A complete evaluation includes B-mode imaging, spectral Doppler, color Doppler, and power Doppler as needed of all accessible portions of each vessel. Unilateral testing is considered an integral part of a complete examination. Limited examinations for reoccurring indications may be performed as noted.  Findings: +--------------------+----------+-----------------+--------+ AVF                 PSV (cm/s)Flow Vol (mL/min)Comments +--------------------+----------+-----------------+--------+ Native artery inflow   299          1624                +--------------------+----------+-----------------+--------+ AVF Anastomosis        246                              +--------------------+----------+-----------------+--------+  +------------+----------+-------------+----------+-----------------------------+ OUTFLOW VEINPSV (cm/s)Diameter (cm)Depth (cm)          Describe            +------------+----------+-------------+----------+-----------------------------+ Prox UA        101        0.84        0.56                                 +------------+----------+-------------+----------+-----------------------------+ Mid  by mouth daily.   lidocaine-prilocaine cream Commonly known as: EMLA Apply 1 Application topically once.   loperamide 2 MG capsule Commonly known as: IMODIUM Take 6 mg by mouth in the morning.   metoprolol succinate 50 MG 24 hr tablet Commonly known as: TOPROL-XL Take 50 mg by mouth in the morning.   mirtazapine 7.5 MG tablet Commonly known as: REMERON Take 7.5 mg by mouth at bedtime.   simvastatin 10 MG tablet Commonly known as: ZOCOR Take 10 mg by mouth at bedtime.   Tradjenta 5 MG Tabs tablet Generic drug: linagliptin Take 5 mg by mouth.   Vitamin D 50 MCG (2000 UT) Caps Take 2,000 Units by mouth every evening.   zinc sulfate 220 (50 Zn) MG capsule Take 220 mg by mouth in the morning.        Procedures/Studies: DG Chest Port 1 View  Result Date: 06/11/2023 CLINICAL DATA:  Sepsis EXAM: PORTABLE CHEST 1 VIEW COMPARISON:  06/17/2021 FINDINGS: A right central venous catheter is present with tip over the low SVC region. No pneumothorax. Shallow inspiration. Heart size and pulmonary vascularity are normal. Mild linear atelectasis in the lung bases. Lungs are otherwise clear. No pleural effusions. No pneumothorax. Mediastinal contours appear intact. Calcification of the aorta. Degenerative changes in the spine and shoulders. IMPRESSION: Mild linear atelectasis in the lung bases. No focal consolidation or airspace disease. Electronically Signed   By: Burman Nieves M.D.   On:  06/11/2023 22:00   VAS US DUPLEX DIALYSIS ACCESS (AVF, AVG)  Result Date: 05/22/2023 DIALYSIS ACCESS Patient Name:  Lindsay Cortez  Date of Exam:   05/22/2023 Medical Rec #: [id number]        Accession #:    [id number] Date of Birth: Sep 11, 1940       Patient Gender: F Patient Age:   82 years Exam Location:  [facility] Procedure:      VAS US DUPLEX DIALYSIS ACCESS (AVF, AVG) Referring Phys: EMMA COLLINS --------------------------------------------------------------------------------  Reason for Exam: Bleeding during dialysis. Used twice. Access Site: Left Upper Extremity. Access Type: Brachial-cephalic AVF. History: Created on 11/21/22. Performing Technologist: Thereasa Parkin RVT  Examination Guidelines: A complete evaluation includes B-mode imaging, spectral Doppler, color Doppler, and power Doppler as needed of all accessible portions of each vessel. Unilateral testing is considered an integral part of a complete examination. Limited examinations for reoccurring indications may be performed as noted.  Findings: +--------------------+----------+-----------------+--------+ AVF                 PSV (cm/s)Flow Vol (mL/min)Comments +--------------------+----------+-----------------+--------+ Native artery inflow   299          1624                +--------------------+----------+-----------------+--------+ AVF Anastomosis        246                              +--------------------+----------+-----------------+--------+  +------------+----------+-------------+----------+-----------------------------+ OUTFLOW VEINPSV (cm/s)Diameter (cm)Depth (cm)          Describe            +------------+----------+-------------+----------+-----------------------------+ Prox UA        101        0.84        0.56                                 +------------+----------+-------------+----------+-----------------------------+ Mid  Physician Discharge Summary  CHANNEL PAPANDREA ZOX:[id number] DOB: 04/25/1941 DOA: 06/11/2023  PCP: Elfredia Nevins, MD Nephrologist: Dr. Wolfgang Phoenix   Admit date: 06/11/2023 Discharge date: 06/12/2023  Admitted From:  Home  Disposition:  Home   Recommendations for Outpatient Follow-up:  Follow up with PCP in 1 weeks Resume regular HD schedule tomorrow Please hold BP meds on dialysis day to avoid hypotension and discuss with your nephrologist   Discharge Condition: STABLE   CODE STATUS: DNR DIET: renal / fluid restricted     Brief Hospitalization Summary: Please see all hospital notes, images, labs for full details of the hospitalization. Admission provider HPI:   82 y.o. female with medical history significant of ESRD on HD, COPD, diabetes mellitus, hypertension, hypothyroidism, and more presents the ED with a chief complaint of near syncope.  Patient reports that she went to dialysis and she felt fine.  She then went out to eat after dialysis and felt fine.  Then while riding in the car she suddenly became very lightheaded.  Her sister took her to urgent care, and they advised her to come to the ER.  She had no loss of consciousness.  She did not collapse to the ground.  She had no nausea or vomiting.  She reports that she does not normally have any reaction after by dialysis.  The last time she took her blood pressure medications was the morning of dialysis.  She does report that she has had no appetite recently.  Her PCP started her on Remeron to try to stimulate her appetite.  She just started dialysis about 6 weeks ago, so she is relatively new to the whole thing.  She reports she does still make urine 2-3 times per day.  She has had some dyspnea on exertion, but nothing that has alarmed her.  She has not had any cough or chest pain.  She does not wear oxygen at home.  She has no other complaints at this time.   Hospital Course   Pt was placed in observation for having an episode of severe  hypotension after HD treatment.  Pt says that she had taken her BP meds on HD day and after HD she had severe episode feeling like she nearly passed out.  She was seen in ED and given a fluid bolus and her BPs have rebounded and she is feeling much better.  We monitored her for several  hours and she had no recurrence of symptoms.  No signs of infection found and she was advised that she should hold BP meds on dialysis days and discuss further with her nephrologist Dr. Wolfgang Phoenix.  She was given 1 dose of oral potassium 40 meQ for hypokalemia.  She verbalized understanding.  She is feeling well.  Her vital signs are stable and she is discharged home is stable condition in the care of her sister.    Discharge Diagnoses:  Principal Problem:   Near syncope Active Problems:   COPD (chronic obstructive pulmonary disease) (HCC)   Diabetes mellitus (HCC)   Hypothyroidism   Essential hypertension   Hypotension   Hypokalemia   Prolonged QT interval   Discharge Instructions:  Allergies as of 06/12/2023       Reactions   Fentanyl Nausea And Vomiting   Codeine Nausea And Vomiting   Narcotic pain medicine makes her nauseated.  Says was told to only take tylenol due to kidney disease.   Levaquin [levofloxacin In D5w] Nausea And Vomiting   Levofloxacin Nausea And Vomiting

## 2023-06-12 NOTE — Care Management Obs Status (Signed)
MEDICARE OBSERVATION STATUS NOTIFICATION   Patient Details  Name: LAVEAH GLOSTER MRN: [id number] Date of Birth: [birth year]   Medicare Observation Status Notification Given:  Yes    Karn Cassis, LCSW 06/12/2023, 8:52 AM

## 2023-06-12 NOTE — Hospital Course (Signed)
81 y.o. female with medical history significant of ESRD on HD, COPD, diabetes mellitus, hypertension, hypothyroidism, and more presents the ED with a chief complaint of near syncope.  Patient reports that she went to dialysis and she felt fine.  She then went out to eat after dialysis and felt fine.  Then while riding in the car she suddenly became very lightheaded.  Her sister took her to urgent care, and they advised her to come to the ER.  She had no loss of consciousness.  She did not collapse to the ground.  She had no nausea or vomiting.  She reports that she does not normally have any reaction after by dialysis.  The last time she took her blood pressure medications was the morning of dialysis.  She does report that she has had no appetite recently.  Her PCP started her on Remeron to try to stimulate her appetite.  She just started dialysis about 6 weeks ago, so she is relatively new to the whole thing.  She reports she does still make urine 2-3 times per day.  She has had some dyspnea on exertion, but nothing that has alarmed her.  She has not had any cough or chest pain.  She does not wear oxygen at home.  She has no other complaints at this time.

## 2023-06-12 NOTE — Assessment & Plan Note (Signed)
Continue Synthroid

## 2023-06-12 NOTE — Assessment & Plan Note (Signed)
-   Holding Norvasc, metoprolol in the setting of hypotension

## 2023-06-12 NOTE — Assessment & Plan Note (Signed)
-   Most likely secondary to hypotension - When patient arrived her systolic blood pressure was 66 - Improved after a small fluid bolus - Patient also had associated lactic acidosis expected with this kind of hypotension - Echo in the a.m. - It is possible the patient was just over dialyzed - Continue gentle IV hydration - Like to be discharged 10/15

## 2023-06-14 DIAGNOSIS — E039 Hypothyroidism, unspecified: Secondary | ICD-10-CM | POA: Diagnosis not present

## 2023-06-14 DIAGNOSIS — Z6824 Body mass index (BMI) 24.0-24.9, adult: Secondary | ICD-10-CM | POA: Diagnosis not present

## 2023-06-14 DIAGNOSIS — E1129 Type 2 diabetes mellitus with other diabetic kidney complication: Secondary | ICD-10-CM | POA: Diagnosis not present

## 2023-06-14 DIAGNOSIS — M818 Other osteoporosis without current pathological fracture: Secondary | ICD-10-CM | POA: Diagnosis not present

## 2023-06-14 DIAGNOSIS — N186 End stage renal disease: Secondary | ICD-10-CM | POA: Diagnosis not present

## 2023-06-14 DIAGNOSIS — J449 Chronic obstructive pulmonary disease, unspecified: Secondary | ICD-10-CM | POA: Diagnosis not present

## 2023-06-14 DIAGNOSIS — I951 Orthostatic hypotension: Secondary | ICD-10-CM | POA: Diagnosis not present

## 2023-06-14 DIAGNOSIS — R42 Dizziness and giddiness: Secondary | ICD-10-CM | POA: Diagnosis not present

## 2023-06-16 LAB — CULTURE, BLOOD (ROUTINE X 2)
Culture: NO GROWTH
Culture: NO GROWTH

## 2023-06-26 DIAGNOSIS — Z808 Family history of malignant neoplasm of other organs or systems: Secondary | ICD-10-CM | POA: Diagnosis not present

## 2023-06-26 DIAGNOSIS — E890 Postprocedural hypothyroidism: Secondary | ICD-10-CM | POA: Diagnosis not present

## 2023-06-26 DIAGNOSIS — I10 Essential (primary) hypertension: Secondary | ICD-10-CM | POA: Diagnosis not present

## 2023-06-26 DIAGNOSIS — E1165 Type 2 diabetes mellitus with hyperglycemia: Secondary | ICD-10-CM | POA: Diagnosis not present

## 2023-06-26 DIAGNOSIS — E7800 Pure hypercholesterolemia, unspecified: Secondary | ICD-10-CM | POA: Diagnosis not present

## 2023-06-26 DIAGNOSIS — N183 Chronic kidney disease, stage 3 unspecified: Secondary | ICD-10-CM | POA: Diagnosis not present

## 2023-06-28 DIAGNOSIS — N390 Urinary tract infection, site not specified: Secondary | ICD-10-CM | POA: Diagnosis not present

## 2023-06-29 DIAGNOSIS — Z992 Dependence on renal dialysis: Secondary | ICD-10-CM | POA: Diagnosis not present

## 2023-06-29 DIAGNOSIS — D631 Anemia in chronic kidney disease: Secondary | ICD-10-CM | POA: Diagnosis not present

## 2023-06-29 DIAGNOSIS — D509 Iron deficiency anemia, unspecified: Secondary | ICD-10-CM | POA: Diagnosis not present

## 2023-06-29 DIAGNOSIS — N186 End stage renal disease: Secondary | ICD-10-CM | POA: Diagnosis not present

## 2023-07-29 DIAGNOSIS — N186 End stage renal disease: Secondary | ICD-10-CM | POA: Diagnosis not present

## 2023-07-29 DIAGNOSIS — Z992 Dependence on renal dialysis: Secondary | ICD-10-CM | POA: Diagnosis not present

## 2023-07-30 DIAGNOSIS — D631 Anemia in chronic kidney disease: Secondary | ICD-10-CM | POA: Diagnosis not present

## 2023-07-30 DIAGNOSIS — Z992 Dependence on renal dialysis: Secondary | ICD-10-CM | POA: Diagnosis not present

## 2023-07-30 DIAGNOSIS — D509 Iron deficiency anemia, unspecified: Secondary | ICD-10-CM | POA: Diagnosis not present

## 2023-07-30 DIAGNOSIS — N186 End stage renal disease: Secondary | ICD-10-CM | POA: Diagnosis not present

## 2023-08-16 ENCOUNTER — Other Ambulatory Visit: Payer: Self-pay

## 2023-08-16 ENCOUNTER — Encounter (HOSPITAL_COMMUNITY): Payer: Self-pay

## 2023-08-16 ENCOUNTER — Emergency Department (HOSPITAL_COMMUNITY)
Admission: EM | Admit: 2023-08-16 | Discharge: 2023-08-16 | Disposition: A | Discharge status: Home / Self Care | Payer: PPO | Attending: Emergency Medicine | Admitting: Emergency Medicine

## 2023-08-16 DIAGNOSIS — Z01812 Encounter for preprocedural laboratory examination: Secondary | ICD-10-CM | POA: Diagnosis not present

## 2023-08-16 DIAGNOSIS — Z0000 Encounter for general adult medical examination without abnormal findings: Secondary | ICD-10-CM | POA: Diagnosis not present

## 2023-08-16 DIAGNOSIS — Z992 Dependence on renal dialysis: Secondary | ICD-10-CM | POA: Insufficient documentation

## 2023-08-16 DIAGNOSIS — N186 End stage renal disease: Secondary | ICD-10-CM | POA: Insufficient documentation

## 2023-08-16 DIAGNOSIS — R799 Abnormal finding of blood chemistry, unspecified: Secondary | ICD-10-CM | POA: Insufficient documentation

## 2023-08-16 DIAGNOSIS — Z0189 Encounter for other specified special examinations: Secondary | ICD-10-CM

## 2023-08-16 DIAGNOSIS — Z79899 Other long term (current) drug therapy: Secondary | ICD-10-CM | POA: Diagnosis not present

## 2023-08-16 LAB — TYPE AND SCREEN
ABO/RH(D): O POS
Antibody Screen: NEGATIVE

## 2023-08-16 LAB — COMPREHENSIVE METABOLIC PANEL
ALT: 12 U/L (ref 0–44)
AST: 21 U/L (ref 15–41)
Albumin: 3.6 g/dL (ref 3.5–5.0)
Alkaline Phosphatase: 87 U/L (ref 38–126)
Anion gap: 8 (ref 5–15)
BUN: 7 mg/dL — ABNORMAL LOW (ref 8–23)
CO2: 32 mmol/L (ref 22–32)
Calcium: 8.6 mg/dL — ABNORMAL LOW (ref 8.9–10.3)
Chloride: 96 mmol/L — ABNORMAL LOW (ref 98–111)
Creatinine, Ser: 1.34 mg/dL — ABNORMAL HIGH (ref 0.44–1.00)
GFR, Estimated: 39 mL/min — ABNORMAL LOW (ref >60)
Glucose, Bld: 152 mg/dL — ABNORMAL HIGH (ref 70–99)
Potassium: 4 mmol/L (ref 3.5–5.1)
Sodium: 136 mmol/L (ref 135–145)
Total Bilirubin: 0.7 mg/dL (ref <1.2)
Total Protein: 6.9 g/dL (ref 6.5–8.1)

## 2023-08-16 LAB — CBC WITH DIFFERENTIAL/PLATELET
Abs Immature Granulocytes: 0.05 10*3/uL (ref 0.00–0.07)
Basophils Absolute: 0.1 10*3/uL (ref 0.0–0.1)
Basophils Relative: 1 %
Eosinophils Absolute: 0.1 10*3/uL (ref 0.0–0.5)
Eosinophils Relative: 2 %
HCT: 41.4 % (ref 36.0–46.0)
Hemoglobin: 12.8 g/dL (ref 12.0–15.0)
Immature Granulocytes: 1 %
Lymphocytes Relative: 17 %
Lymphs Abs: 1.6 10*3/uL (ref 0.7–4.0)
MCH: 29.6 pg (ref 26.0–34.0)
MCHC: 30.9 g/dL (ref 30.0–36.0)
MCV: 95.8 fL (ref 80.0–100.0)
Monocytes Absolute: 1.2 10*3/uL — ABNORMAL HIGH (ref 0.1–1.0)
Monocytes Relative: 13 %
Neutro Abs: 6.3 10*3/uL (ref 1.7–7.7)
Neutrophils Relative %: 66 %
Platelets: 222 10*3/uL (ref 150–400)
RBC: 4.32 MIL/uL (ref 3.87–5.11)
RDW: 15.4 % (ref 11.5–15.5)
WBC: 9.3 10*3/uL (ref 4.0–10.5)
nRBC: 0 % (ref 0.0–0.2)

## 2023-08-16 NOTE — ED Provider Notes (Signed)
[place] EMERGENCY DEPARTMENT AT [facility] Provider Note   CSN: [id number] Arrival date & time: 08/16/23  1500     History  Chief Complaint  Patient presents with   Abnormal Lab    Lindsay Cortez is a 82 y.o. female.  She is a history of ESRD on dialysis Monday Wednesday Friday, dialyzed yesterday and today as she is going to miss tomorrow, was called and told her hemoglobin had dropped and she did come to the ER, hemoglobin is been around 12.8 but today's value was 9.01.  Advised to come to ER for evaluation.  Denies any blood loss, denies dizziness, weakness, palpitations, chest pain or shortness of breath.  Abnormal Lab      Home Medications Prior to Admission medications   Medication Sig Start Date End Date Taking? Authorizing Provider  allopurinol (ZYLOPRIM) 100 MG tablet Take 200 mg by mouth in the morning.    [provider]  amLODipine (NORVASC) 5 MG tablet Take 5 mg by mouth in the morning.    [provider]  BREO ELLIPTA 100-25 MCG/ACT AEPB Inhale 1 puff into the lungs daily. 05/29/23   [provider]  Calcium Carb-Cholecalciferol (CALCIUM 600 + D PO) Take 1 tablet by mouth every evening.    [provider]  Cholecalciferol (VITAMIN D) 2000 UNITS CAPS Take 2,000 Units by mouth every evening.    [provider]  furosemide (LASIX) 20 MG tablet Take 40 mg by mouth daily. 03/25/21   [provider]  levothyroxine (SYNTHROID) 100 MCG tablet Take 100 mcg by mouth daily before breakfast. 09/27/22   [provider]  levothyroxine (SYNTHROID) 25 MCG tablet Take 25 mcg by mouth daily. 04/10/23   [provider]  lidocaine-prilocaine (EMLA) cream Apply 1 Application topically once. 03/19/23   [provider]  loperamide (IMODIUM) 2 MG capsule Take 6 mg by mouth in the morning.    [provider]  metoprolol succinate (TOPROL-XL) 50 MG 24 hr tablet Take 50 mg by mouth in the  morning. 03/10/19   [provider]  mirtazapine (REMERON) 7.5 MG tablet Take 7.5 mg by mouth at bedtime. 05/22/23   [provider]  Multiple Vitamins-Minerals (HAIR SKIN NAILS PO) Take 1 tablet by mouth in the morning.    [provider]  simvastatin (ZOCOR) 10 MG tablet Take 10 mg by mouth at bedtime.    [provider]  TRADJENTA 5 MG TABS tablet Take 5 mg by mouth. 03/05/23   [provider]  zinc sulfate 220 (50 Zn) MG capsule Take 220 mg by mouth in the morning.    [provider]      Allergies    Fentanyl, Codeine, Levaquin [levofloxacin in d5w], and Levofloxacin    Review of Systems   Review of Systems  Physical Exam Updated Vital Signs BP (!) 118/52   Pulse 83   Temp (!) 97.5 F (36.4 C) (Oral)   Resp 20   Ht 4\' 11"  (1.499 m)   Wt 49.9 kg   SpO2 93%   BMI 22.22 kg/m  Physical Exam Vitals and nursing note reviewed.  Constitutional:      General: She is not in acute distress.    Appearance: She is well-developed.  HENT:     Head: Normocephalic and atraumatic.  Eyes:     Conjunctiva/sclera: Conjunctivae normal.  Cardiovascular:     Rate and Rhythm: Normal rate and regular rhythm.     Heart sounds:  No murmur heard. Pulmonary:     Effort: Pulmonary effort is normal. No respiratory distress.     Breath sounds: Normal breath sounds.  Abdominal:     Palpations: Abdomen is soft.     Tenderness: There is no abdominal tenderness.  Musculoskeletal:        General: No swelling.     Cervical back: Neck supple.  Skin:    General: Skin is warm and dry.     Capillary Refill: Capillary refill takes less than 2 seconds.  Neurological:     General: No focal deficit present.     Mental Status: She is alert and oriented to person, place, and time.  Psychiatric:        Mood and Affect: Mood normal.     ED Results / Procedures / Treatments   Labs (all labs ordered are listed, but only abnormal results are  displayed) Labs Reviewed  CBC WITH DIFFERENTIAL/PLATELET - Abnormal; Notable for the following components:      Result Value   Monocytes Absolute 1.2 (*)    All other components within normal limits  COMPREHENSIVE METABOLIC PANEL - Abnormal; Notable for the following components:   Chloride 96 (*)    Glucose, Bld 152 (*)    BUN 7 (*)    Creatinine, Ser 1.34 (*)    Calcium 8.6 (*)    GFR, Estimated 39 (*)    All other components within normal limits  TYPE AND SCREEN    EKG None  Radiology No results found.  Procedures Procedures    Medications Ordered in ED Medications - No data to display  ED Course/ Medical Decision Making/ A&P                                 Medical Decision Making DDx: Anemia, blood loss, iron deficiency, lab error, other  Course: Patient has normal labs today, hemoglobin 12.8 which her recent records from dialysis show that was her most recent on 12/9 as well.  Her lab value of 9.01 today was likely a lab error, she has no symptoms, no pallor, no dizziness, no syncope, no fatigue or dyspnea or any other symptoms to suggest anemia and she has not noticed any blood loss.  We discussed follow-up with PCP and dialysis and strict return precautions.  Amount and/or Complexity of Data Reviewed Labs: ordered. Decision-making details documented in ED Course.           Final Clinical Impression(s) / ED Diagnoses Final diagnoses:  None    Rx / DC Orders ED Discharge Orders     None         Josem Kaufmann 08/16/23 1702    Bethann Berkshire, MD 08/17/23 1655

## 2023-08-16 NOTE — ED Triage Notes (Signed)
MWF dialysis Went yesterday and today since she is unable to go tomorrow  Recd full treatment today  Informed that hemoglobin was low after she got home from dialysis and told her to come to ED.  Denies HA, dizziness and blurred vision.  Pt stated she still make urine Pt stated she had normal BM today and denies blood in stool.

## 2023-08-16 NOTE — Discharge Instructions (Signed)
It was a pleasure taking care of you today.  You are seen for a drop in your hemoglobin from dialysis.  Fortunately your hemoglobin here is 12.8 which it was on your recent labs sent from your doctor.  Please follow-up with your primary care doctor, come back to the ER for new or worsening symptoms.

## 2023-08-29 DIAGNOSIS — D631 Anemia in chronic kidney disease: Secondary | ICD-10-CM | POA: Diagnosis not present

## 2023-08-29 DIAGNOSIS — D509 Iron deficiency anemia, unspecified: Secondary | ICD-10-CM | POA: Diagnosis not present

## 2023-08-29 DIAGNOSIS — Z23 Encounter for immunization: Secondary | ICD-10-CM | POA: Diagnosis not present

## 2023-08-29 DIAGNOSIS — Z992 Dependence on renal dialysis: Secondary | ICD-10-CM | POA: Diagnosis not present

## 2023-08-29 DIAGNOSIS — N186 End stage renal disease: Secondary | ICD-10-CM | POA: Diagnosis not present

## 2023-09-21 DIAGNOSIS — E782 Mixed hyperlipidemia: Secondary | ICD-10-CM | POA: Diagnosis not present

## 2023-09-21 DIAGNOSIS — E1140 Type 2 diabetes mellitus with diabetic neuropathy, unspecified: Secondary | ICD-10-CM | POA: Diagnosis not present

## 2023-09-21 DIAGNOSIS — I10 Essential (primary) hypertension: Secondary | ICD-10-CM | POA: Diagnosis not present

## 2023-09-21 DIAGNOSIS — I700 Atherosclerosis of aorta: Secondary | ICD-10-CM | POA: Diagnosis not present

## 2023-09-21 DIAGNOSIS — J449 Chronic obstructive pulmonary disease, unspecified: Secondary | ICD-10-CM | POA: Diagnosis not present

## 2023-09-21 DIAGNOSIS — E1165 Type 2 diabetes mellitus with hyperglycemia: Secondary | ICD-10-CM | POA: Diagnosis not present

## 2023-09-21 DIAGNOSIS — E1129 Type 2 diabetes mellitus with other diabetic kidney complication: Secondary | ICD-10-CM | POA: Diagnosis not present

## 2023-09-21 DIAGNOSIS — M1991 Primary osteoarthritis, unspecified site: Secondary | ICD-10-CM | POA: Diagnosis not present

## 2023-09-21 DIAGNOSIS — Z6821 Body mass index (BMI) 21.0-21.9, adult: Secondary | ICD-10-CM | POA: Diagnosis not present

## 2023-09-21 DIAGNOSIS — N184 Chronic kidney disease, stage 4 (severe): Secondary | ICD-10-CM | POA: Diagnosis not present

## 2023-09-21 DIAGNOSIS — N186 End stage renal disease: Secondary | ICD-10-CM | POA: Diagnosis not present

## 2023-09-21 DIAGNOSIS — N390 Urinary tract infection, site not specified: Secondary | ICD-10-CM | POA: Diagnosis not present

## 2023-09-26 ENCOUNTER — Encounter (HOSPITAL_COMMUNITY): Payer: Self-pay

## 2023-09-26 ENCOUNTER — Emergency Department (HOSPITAL_COMMUNITY)
Admission: EM | Admit: 2023-09-26 | Discharge: 2023-09-27 | Disposition: A | Discharge status: Home / Self Care | Payer: PPO | Attending: Emergency Medicine | Admitting: Emergency Medicine

## 2023-09-26 ENCOUNTER — Other Ambulatory Visit: Payer: Self-pay

## 2023-09-26 DIAGNOSIS — J45909 Unspecified asthma, uncomplicated: Secondary | ICD-10-CM | POA: Insufficient documentation

## 2023-09-26 DIAGNOSIS — N186 End stage renal disease: Secondary | ICD-10-CM | POA: Insufficient documentation

## 2023-09-26 DIAGNOSIS — I959 Hypotension, unspecified: Secondary | ICD-10-CM | POA: Diagnosis not present

## 2023-09-26 DIAGNOSIS — Z992 Dependence on renal dialysis: Secondary | ICD-10-CM | POA: Insufficient documentation

## 2023-09-26 DIAGNOSIS — J449 Chronic obstructive pulmonary disease, unspecified: Secondary | ICD-10-CM | POA: Diagnosis not present

## 2023-09-26 DIAGNOSIS — E1122 Type 2 diabetes mellitus with diabetic chronic kidney disease: Secondary | ICD-10-CM | POA: Insufficient documentation

## 2023-09-26 DIAGNOSIS — I120 Hypertensive chronic kidney disease with stage 5 chronic kidney disease or end stage renal disease: Secondary | ICD-10-CM | POA: Diagnosis not present

## 2023-09-26 DIAGNOSIS — R42 Dizziness and giddiness: Secondary | ICD-10-CM | POA: Insufficient documentation

## 2023-09-26 DIAGNOSIS — R0902 Hypoxemia: Secondary | ICD-10-CM | POA: Diagnosis not present

## 2023-09-26 LAB — CBC WITH DIFFERENTIAL/PLATELET
Abs Immature Granulocytes: 0.09 10*3/uL — ABNORMAL HIGH (ref 0.00–0.07)
Basophils Absolute: 0.1 10*3/uL (ref 0.0–0.1)
Basophils Relative: 1 %
Eosinophils Absolute: 0.1 10*3/uL (ref 0.0–0.5)
Eosinophils Relative: 1 %
HCT: 35.4 % — ABNORMAL LOW (ref 36.0–46.0)
Hemoglobin: 11.2 g/dL — ABNORMAL LOW (ref 12.0–15.0)
Immature Granulocytes: 1 %
Lymphocytes Relative: 12 %
Lymphs Abs: 1.5 10*3/uL (ref 0.7–4.0)
MCH: 30.5 pg (ref 26.0–34.0)
MCHC: 31.6 g/dL (ref 30.0–36.0)
MCV: 96.5 fL (ref 80.0–100.0)
Monocytes Absolute: 1.7 10*3/uL — ABNORMAL HIGH (ref 0.1–1.0)
Monocytes Relative: 13 %
Neutro Abs: 9.1 10*3/uL — ABNORMAL HIGH (ref 1.7–7.7)
Neutrophils Relative %: 72 %
Platelets: 279 10*3/uL (ref 150–400)
RBC: 3.67 MIL/uL — ABNORMAL LOW (ref 3.87–5.11)
RDW: 15.9 % — ABNORMAL HIGH (ref 11.5–15.5)
WBC: 12.5 10*3/uL — ABNORMAL HIGH (ref 4.0–10.5)
nRBC: 0 % (ref 0.0–0.2)

## 2023-09-26 LAB — BASIC METABOLIC PANEL
Anion gap: 10 (ref 5–15)
BUN: 10 mg/dL (ref 8–23)
CO2: 32 mmol/L (ref 22–32)
Calcium: 8.4 mg/dL — ABNORMAL LOW (ref 8.9–10.3)
Chloride: 95 mmol/L — ABNORMAL LOW (ref 98–111)
Creatinine, Ser: 1.59 mg/dL — ABNORMAL HIGH (ref 0.44–1.00)
GFR, Estimated: 32 mL/min — ABNORMAL LOW (ref >60)
Glucose, Bld: 92 mg/dL (ref 70–99)
Potassium: 3.1 mmol/L — ABNORMAL LOW (ref 3.5–5.1)
Sodium: 137 mmol/L (ref 135–145)

## 2023-09-26 NOTE — ED Triage Notes (Signed)
Pt arrived from home via [facility] c/o dizziness that began today after completing her dialysis treatment. Pt reports she fell yesterday as well.

## 2023-09-27 NOTE — ED Provider Notes (Signed)
[facility] Emergency Department Provider Note MRN:  [id number]  Arrival date & time: 09/27/23     Chief Complaint   Dizziness   History of Present Illness   Lindsay Cortez is a 83 y.o. year-old female with a history of diabetes, COPD, ESRD presenting to the ED with chief complaint of dizziness.  Felt very dizzy soon after dialysis today, felt like she was going to pass out, came here for evaluation.  Is actually feeling a lot better, no longer dizzy.  Denies any chest pain or shortness of breath today, no leg pain or swelling, no recent fever or cough.  No symptoms at this time.  Review of Systems  A thorough review of systems was obtained and all systems are negative except as noted in the HPI and PMH.   Patient's Health History    Past Medical History:  Diagnosis Date   Arthritis    Asthma    Back pain    COPD (chronic obstructive pulmonary disease) (HCC)    Diabetes (HCC) 09/01/2014   Diabetes mellitus    x 15 yrs   Gout    Hypertension    Kidney stones    Pancreatic cyst    Pneumonia    Renal disorder    Renal insufficiency    Shoulder dislocation    Vertigo     Past Surgical History:  Procedure Laterality Date   ABDOMINAL HYSTERECTOMY     AV FISTULA PLACEMENT Left 11/21/2022   Procedure: LEFT ARM ARTERIOVENOUS (AV) FISTULA CREATION;  Surgeon: Larina Earthly, MD;  Location: [facility];  Service: Vascular;  Laterality: Left;   BLADDER SURGERY     bladder tac   BREAST SURGERY Left    benign   FOOT SURGERY Bilateral    bunions    Family History  Problem Relation Age of Onset   Breast cancer Mother    Emphysema Father    COPD Sister    Atrial fibrillation Sister    Obesity Brother    Cirrhosis Daughter    COPD Son    Diabetes Brother    Asthma Brother     Social History   Socioeconomic History   Marital status: Widowed    Spouse name: Not on file   Number of children: 2   Years of education: Not on file   Highest education  level: Not on file  Occupational History    Employer: RETIRED  Tobacco Use   Smoking status: Never   Smokeless tobacco: Never  Vaping Use   Vaping status: Never Used  Substance and Sexual Activity   Alcohol use: No    Alcohol/week: 0.0 standard drinks of alcohol   Drug use: No   Sexual activity: Not Currently    Birth control/protection: Surgical  Other Topics Concern   Not on file  Social History Narrative   Not on file   Social Drivers of Health   Financial Resource Strain: Not on file  Food Insecurity: Not on file  Transportation Needs: Not on file  Physical Activity: Not on file  Stress: Not on file  Social Connections: Not on file  Intimate Partner Violence: Not on file     Physical Exam   Vitals:   09/26/23 2330 09/27/23 0000  BP: 131/67 136/76  Pulse: 89 88  Resp: 16 17  Temp:    SpO2: 92% 93%    CONSTITUTIONAL: Well-appearing, NAD NEURO/PSYCH:  Alert and oriented x 3, no focal deficits EYES:  eyes equal  and reactive ENT/NECK:  no LAD, no JVD CARDIO: Regular rate, well-perfused, normal S1 and S2 PULM:  CTAB no wheezing or rhonchi GI/GU:  non-distended, non-tender MSK/SPINE:  No gross deformities, no edema SKIN:  no rash, atraumatic   *Additional and/or pertinent findings included in MDM below  Diagnostic and Interventional Summary    EKG Interpretation Date/Time:  Wednesday September 26 2023 23:42:43 EST Ventricular Rate:  89 PR Interval:  186 QRS Duration:  150 QT Interval:  461 QTC Calculation: 561 R Axis:   142  Text Interpretation: Sinus rhythm Nonspecific intraventricular conduction delay Minimal ST depression Confirmed by Kennis Carina [phone]) on 09/27/2023 12:11:22 AM       Labs Reviewed  CBC WITH DIFFERENTIAL/PLATELET - Abnormal; Notable for the following components:      Result Value   WBC 12.5 (*)    RBC 3.67 (*)    Hemoglobin 11.2 (*)    HCT 35.4 (*)    RDW 15.9 (*)    Neutro Abs 9.1 (*)    Monocytes Absolute 1.7 (*)    Abs  Immature Granulocytes 0.09 (*)    All other components within normal limits  BASIC METABOLIC PANEL - Abnormal; Notable for the following components:   Potassium 3.1 (*)    Chloride 95 (*)    Creatinine, Ser 1.59 (*)    Calcium 8.4 (*)    GFR, Estimated 32 (*)    All other components within normal limits    No orders to display    Medications - No data to display   Procedures  /  Critical Care Procedures  ED Course and Medical Decision Making  Initial Impression and Ddx Suspect dialysis disequilibrium or orthostatic hypotension after dialysis.  Patient seems to have since equilibrated and now feels normal.  Blood pressure was soft on arrival, now reassuring.  Arrhythmias another consideration given the dizziness but felt to be much less likely.  Electrolyte disturbance a consideration as well.  Will ambulate the patient to ensure that she is not a significant fall risk, awaiting labs, EKG, anticipating discharge.  Past medical/surgical history that increases complexity of ED encounter: ESRD  Interpretation of Diagnostics I personally reviewed the EKG and my interpretation is as follows: Sinus rhythm without significant change from prior  Labs without significant blood count or electrolyte disturbance  Patient Reassessment and Ultimate Disposition/Management     Patient ambulating without issue, appropriate for discharge.  Patient management required discussion with the following services or consulting groups:  None  Complexity of Problems Addressed Acute illness or injury that poses threat of life of bodily function  Additional Data Reviewed and Analyzed Further history obtained from: Further history from spouse/family member  Additional Factors Impacting ED Encounter Risk Consideration of hospitalization  Elmer Sow. Pilar Plate, MD [facility] [email or web address]  Final Clinical Impressions(s) / ED Diagnoses     ICD-10-CM    1. Dizziness  R42       ED Discharge Orders     None        Discharge Instructions Discussed with and Provided to Patient:   Discharge Instructions   None      Sabas Sous, MD 09/27/23 [phone]

## 2023-09-27 NOTE — ED Notes (Signed)
Pt stood up independently from stretcher and walked to the room door and back. No complaints of dizziness or feeling lightheaded.

## 2023-09-27 NOTE — Discharge Instructions (Signed)
You were evaluated in the Emergency Department and after careful evaluation, we did not find any emergent condition requiring admission or further testing in the hospital.  Your exam/testing today was overall reassuring.  Suspect her dizziness was related to recent dialysis as we discussed.  Recommend continued follow-up with your regular doctors.  Please return to the Emergency Department if you experience any worsening of your condition.  Thank you for allowing [place] to be a part of your care.

## 2023-09-29 DIAGNOSIS — D631 Anemia in chronic kidney disease: Secondary | ICD-10-CM | POA: Diagnosis not present

## 2023-09-29 DIAGNOSIS — N186 End stage renal disease: Secondary | ICD-10-CM | POA: Diagnosis not present

## 2023-09-29 DIAGNOSIS — Z992 Dependence on renal dialysis: Secondary | ICD-10-CM | POA: Diagnosis not present

## 2023-09-29 DIAGNOSIS — D509 Iron deficiency anemia, unspecified: Secondary | ICD-10-CM | POA: Diagnosis not present

## 2023-10-22 ENCOUNTER — Other Ambulatory Visit: Payer: Self-pay | Admitting: Nurse Practitioner

## 2023-10-27 DIAGNOSIS — N186 End stage renal disease: Secondary | ICD-10-CM | POA: Diagnosis not present

## 2023-10-27 DIAGNOSIS — Z992 Dependence on renal dialysis: Secondary | ICD-10-CM | POA: Diagnosis not present

## 2023-10-29 DIAGNOSIS — D509 Iron deficiency anemia, unspecified: Secondary | ICD-10-CM | POA: Diagnosis not present

## 2023-10-29 DIAGNOSIS — N186 End stage renal disease: Secondary | ICD-10-CM | POA: Diagnosis not present

## 2023-10-29 DIAGNOSIS — D631 Anemia in chronic kidney disease: Secondary | ICD-10-CM | POA: Diagnosis not present

## 2023-10-29 DIAGNOSIS — Z992 Dependence on renal dialysis: Secondary | ICD-10-CM | POA: Diagnosis not present

## 2023-10-30 DIAGNOSIS — M47816 Spondylosis without myelopathy or radiculopathy, lumbar region: Secondary | ICD-10-CM | POA: Diagnosis not present

## 2023-11-17 DIAGNOSIS — M1030 Gout due to renal impairment, unspecified site: Secondary | ICD-10-CM | POA: Diagnosis not present

## 2023-11-17 DIAGNOSIS — J4489 Other specified chronic obstructive pulmonary disease: Secondary | ICD-10-CM | POA: Diagnosis not present

## 2023-11-17 DIAGNOSIS — N390 Urinary tract infection, site not specified: Secondary | ICD-10-CM | POA: Diagnosis not present

## 2023-11-17 DIAGNOSIS — F32A Depression, unspecified: Secondary | ICD-10-CM | POA: Diagnosis not present

## 2023-11-17 DIAGNOSIS — E1122 Type 2 diabetes mellitus with diabetic chronic kidney disease: Secondary | ICD-10-CM | POA: Diagnosis not present

## 2023-11-17 DIAGNOSIS — K589 Irritable bowel syndrome without diarrhea: Secondary | ICD-10-CM | POA: Diagnosis not present

## 2023-11-17 DIAGNOSIS — E782 Mixed hyperlipidemia: Secondary | ICD-10-CM | POA: Diagnosis not present

## 2023-11-17 DIAGNOSIS — H539 Unspecified visual disturbance: Secondary | ICD-10-CM | POA: Diagnosis not present

## 2023-11-17 DIAGNOSIS — E1165 Type 2 diabetes mellitus with hyperglycemia: Secondary | ICD-10-CM | POA: Diagnosis not present

## 2023-11-17 DIAGNOSIS — M818 Other osteoporosis without current pathological fracture: Secondary | ICD-10-CM | POA: Diagnosis not present

## 2023-11-17 DIAGNOSIS — G8929 Other chronic pain: Secondary | ICD-10-CM | POA: Diagnosis not present

## 2023-11-17 DIAGNOSIS — E039 Hypothyroidism, unspecified: Secondary | ICD-10-CM | POA: Diagnosis not present

## 2023-11-17 DIAGNOSIS — E1169 Type 2 diabetes mellitus with other specified complication: Secondary | ICD-10-CM | POA: Diagnosis not present

## 2023-11-17 DIAGNOSIS — I700 Atherosclerosis of aorta: Secondary | ICD-10-CM | POA: Diagnosis not present

## 2023-11-17 DIAGNOSIS — N186 End stage renal disease: Secondary | ICD-10-CM | POA: Diagnosis not present

## 2023-11-17 DIAGNOSIS — D631 Anemia in chronic kidney disease: Secondary | ICD-10-CM | POA: Diagnosis not present

## 2023-11-17 DIAGNOSIS — M25511 Pain in right shoulder: Secondary | ICD-10-CM | POA: Diagnosis not present

## 2023-11-17 DIAGNOSIS — M519 Unspecified thoracic, thoracolumbar and lumbosacral intervertebral disc disorder: Secondary | ICD-10-CM | POA: Diagnosis not present

## 2023-11-17 DIAGNOSIS — E1140 Type 2 diabetes mellitus with diabetic neuropathy, unspecified: Secondary | ICD-10-CM | POA: Diagnosis not present

## 2023-11-17 DIAGNOSIS — I120 Hypertensive chronic kidney disease with stage 5 chronic kidney disease or end stage renal disease: Secondary | ICD-10-CM | POA: Diagnosis not present

## 2023-11-17 DIAGNOSIS — Z992 Dependence on renal dialysis: Secondary | ICD-10-CM | POA: Diagnosis not present

## 2023-11-17 DIAGNOSIS — M150 Primary generalized (osteo)arthritis: Secondary | ICD-10-CM | POA: Diagnosis not present

## 2023-11-17 DIAGNOSIS — M25512 Pain in left shoulder: Secondary | ICD-10-CM | POA: Diagnosis not present

## 2023-11-17 DIAGNOSIS — K5900 Constipation, unspecified: Secondary | ICD-10-CM | POA: Diagnosis not present

## 2023-11-27 DIAGNOSIS — Z992 Dependence on renal dialysis: Secondary | ICD-10-CM | POA: Diagnosis not present

## 2023-11-27 DIAGNOSIS — N186 End stage renal disease: Secondary | ICD-10-CM | POA: Diagnosis not present

## 2023-11-28 DIAGNOSIS — E1122 Type 2 diabetes mellitus with diabetic chronic kidney disease: Secondary | ICD-10-CM | POA: Diagnosis not present

## 2023-11-28 DIAGNOSIS — N186 End stage renal disease: Secondary | ICD-10-CM | POA: Diagnosis not present

## 2023-11-28 DIAGNOSIS — I120 Hypertensive chronic kidney disease with stage 5 chronic kidney disease or end stage renal disease: Secondary | ICD-10-CM | POA: Diagnosis not present

## 2023-11-28 DIAGNOSIS — D631 Anemia in chronic kidney disease: Secondary | ICD-10-CM | POA: Diagnosis not present

## 2023-11-28 DIAGNOSIS — Z992 Dependence on renal dialysis: Secondary | ICD-10-CM | POA: Diagnosis not present

## 2023-11-28 DIAGNOSIS — D509 Iron deficiency anemia, unspecified: Secondary | ICD-10-CM | POA: Diagnosis not present

## 2023-12-24 DIAGNOSIS — Z992 Dependence on renal dialysis: Secondary | ICD-10-CM | POA: Diagnosis not present

## 2023-12-24 DIAGNOSIS — N186 End stage renal disease: Secondary | ICD-10-CM | POA: Diagnosis not present

## 2023-12-26 DIAGNOSIS — E1122 Type 2 diabetes mellitus with diabetic chronic kidney disease: Secondary | ICD-10-CM | POA: Diagnosis not present

## 2023-12-26 DIAGNOSIS — J449 Chronic obstructive pulmonary disease, unspecified: Secondary | ICD-10-CM | POA: Diagnosis not present

## 2023-12-26 DIAGNOSIS — I120 Hypertensive chronic kidney disease with stage 5 chronic kidney disease or end stage renal disease: Secondary | ICD-10-CM | POA: Diagnosis not present

## 2023-12-27 DIAGNOSIS — Z992 Dependence on renal dialysis: Secondary | ICD-10-CM | POA: Diagnosis not present

## 2023-12-27 DIAGNOSIS — N186 End stage renal disease: Secondary | ICD-10-CM | POA: Diagnosis not present

## 2023-12-28 DIAGNOSIS — D509 Iron deficiency anemia, unspecified: Secondary | ICD-10-CM | POA: Diagnosis not present

## 2023-12-28 DIAGNOSIS — D631 Anemia in chronic kidney disease: Secondary | ICD-10-CM | POA: Diagnosis not present

## 2023-12-28 DIAGNOSIS — N186 End stage renal disease: Secondary | ICD-10-CM | POA: Diagnosis not present

## 2023-12-28 DIAGNOSIS — Z992 Dependence on renal dialysis: Secondary | ICD-10-CM | POA: Diagnosis not present

## 2024-01-13 ENCOUNTER — Encounter (HOSPITAL_COMMUNITY): Payer: Self-pay | Admitting: Emergency Medicine

## 2024-01-13 ENCOUNTER — Emergency Department (HOSPITAL_COMMUNITY)

## 2024-01-13 ENCOUNTER — Inpatient Hospital Stay (HOSPITAL_COMMUNITY)
Admission: EM | Admit: 2024-01-13 | Discharge: 2024-01-15 | DRG: 308 | Disposition: A | Discharge status: Home Health | Attending: Family Medicine | Admitting: Family Medicine

## 2024-01-13 ENCOUNTER — Other Ambulatory Visit: Payer: Self-pay

## 2024-01-13 DIAGNOSIS — E119 Type 2 diabetes mellitus without complications: Secondary | ICD-10-CM

## 2024-01-13 DIAGNOSIS — Z825 Family history of asthma and other chronic lower respiratory diseases: Secondary | ICD-10-CM

## 2024-01-13 DIAGNOSIS — E039 Hypothyroidism, unspecified: Secondary | ICD-10-CM | POA: Diagnosis not present

## 2024-01-13 DIAGNOSIS — Z90710 Acquired absence of both cervix and uterus: Secondary | ICD-10-CM | POA: Diagnosis not present

## 2024-01-13 DIAGNOSIS — Z7984 Long term (current) use of oral hypoglycemic drugs: Secondary | ICD-10-CM

## 2024-01-13 DIAGNOSIS — J4489 Other specified chronic obstructive pulmonary disease: Secondary | ICD-10-CM | POA: Diagnosis not present

## 2024-01-13 DIAGNOSIS — Z833 Family history of diabetes mellitus: Secondary | ICD-10-CM

## 2024-01-13 DIAGNOSIS — Z885 Allergy status to narcotic agent status: Secondary | ICD-10-CM | POA: Diagnosis not present

## 2024-01-13 DIAGNOSIS — N186 End stage renal disease: Secondary | ICD-10-CM

## 2024-01-13 DIAGNOSIS — I10 Essential (primary) hypertension: Secondary | ICD-10-CM | POA: Diagnosis not present

## 2024-01-13 DIAGNOSIS — Z79899 Other long term (current) drug therapy: Secondary | ICD-10-CM | POA: Diagnosis not present

## 2024-01-13 DIAGNOSIS — Z7951 Long term (current) use of inhaled steroids: Secondary | ICD-10-CM

## 2024-01-13 DIAGNOSIS — E1165 Type 2 diabetes mellitus with hyperglycemia: Secondary | ICD-10-CM | POA: Diagnosis not present

## 2024-01-13 DIAGNOSIS — E1122 Type 2 diabetes mellitus with diabetic chronic kidney disease: Secondary | ICD-10-CM | POA: Diagnosis present

## 2024-01-13 DIAGNOSIS — I4891 Unspecified atrial fibrillation: Secondary | ICD-10-CM | POA: Diagnosis not present

## 2024-01-13 DIAGNOSIS — I517 Cardiomegaly: Secondary | ICD-10-CM | POA: Diagnosis not present

## 2024-01-13 DIAGNOSIS — D631 Anemia in chronic kidney disease: Secondary | ICD-10-CM | POA: Diagnosis present

## 2024-01-13 DIAGNOSIS — Z79890 Hormone replacement therapy (postmenopausal): Secondary | ICD-10-CM

## 2024-01-13 DIAGNOSIS — R9431 Abnormal electrocardiogram [ECG] [EKG]: Secondary | ICD-10-CM | POA: Diagnosis not present

## 2024-01-13 DIAGNOSIS — J449 Chronic obstructive pulmonary disease, unspecified: Secondary | ICD-10-CM | POA: Diagnosis present

## 2024-01-13 DIAGNOSIS — Z803 Family history of malignant neoplasm of breast: Secondary | ICD-10-CM

## 2024-01-13 DIAGNOSIS — I120 Hypertensive chronic kidney disease with stage 5 chronic kidney disease or end stage renal disease: Secondary | ICD-10-CM | POA: Diagnosis present

## 2024-01-13 DIAGNOSIS — Z66 Do not resuscitate: Secondary | ICD-10-CM | POA: Diagnosis not present

## 2024-01-13 DIAGNOSIS — Z992 Dependence on renal dialysis: Secondary | ICD-10-CM | POA: Diagnosis not present

## 2024-01-13 DIAGNOSIS — R000 Tachycardia, unspecified: Secondary | ICD-10-CM | POA: Diagnosis not present

## 2024-01-13 DIAGNOSIS — Z881 Allergy status to other antibiotic agents status: Secondary | ICD-10-CM

## 2024-01-13 DIAGNOSIS — R55 Syncope and collapse: Secondary | ICD-10-CM | POA: Diagnosis not present

## 2024-01-13 DIAGNOSIS — M109 Gout, unspecified: Secondary | ICD-10-CM | POA: Diagnosis not present

## 2024-01-13 DIAGNOSIS — N2581 Secondary hyperparathyroidism of renal origin: Secondary | ICD-10-CM | POA: Diagnosis not present

## 2024-01-13 DIAGNOSIS — I959 Hypotension, unspecified: Secondary | ICD-10-CM | POA: Diagnosis not present

## 2024-01-13 DIAGNOSIS — R42 Dizziness and giddiness: Secondary | ICD-10-CM | POA: Diagnosis not present

## 2024-01-13 DIAGNOSIS — R079 Chest pain, unspecified: Secondary | ICD-10-CM | POA: Diagnosis not present

## 2024-01-13 DIAGNOSIS — N250 Renal osteodystrophy: Secondary | ICD-10-CM | POA: Diagnosis not present

## 2024-01-13 LAB — COMPREHENSIVE METABOLIC PANEL WITH GFR
ALT: 23 U/L (ref 0–44)
AST: 36 U/L (ref 15–41)
Albumin: 3.4 g/dL — ABNORMAL LOW (ref 3.5–5.0)
Alkaline Phosphatase: 88 U/L (ref 38–126)
Anion gap: 10 (ref 5–15)
BUN: 45 mg/dL — ABNORMAL HIGH (ref 8–23)
CO2: 22 mmol/L (ref 22–32)
Calcium: 8.9 mg/dL (ref 8.9–10.3)
Chloride: 102 mmol/L (ref 98–111)
Creatinine, Ser: 4.15 mg/dL — ABNORMAL HIGH (ref 0.44–1.00)
GFR, Estimated: 10 mL/min — ABNORMAL LOW (ref >60)
Glucose, Bld: 249 mg/dL — ABNORMAL HIGH (ref 70–99)
Potassium: 3.5 mmol/L (ref 3.5–5.1)
Sodium: 134 mmol/L — ABNORMAL LOW (ref 135–145)
Total Bilirubin: 0.8 mg/dL (ref 0.0–1.2)
Total Protein: 6.2 g/dL — ABNORMAL LOW (ref 6.5–8.1)

## 2024-01-13 LAB — CBC WITH DIFFERENTIAL/PLATELET
Abs Immature Granulocytes: 0.02 10*3/uL (ref 0.00–0.07)
Basophils Absolute: 0 10*3/uL (ref 0.0–0.1)
Basophils Relative: 0 %
Eosinophils Absolute: 0.1 10*3/uL (ref 0.0–0.5)
Eosinophils Relative: 1 %
HCT: 32.6 % — ABNORMAL LOW (ref 36.0–46.0)
Hemoglobin: 10.2 g/dL — ABNORMAL LOW (ref 12.0–15.0)
Immature Granulocytes: 0 %
Lymphocytes Relative: 14 %
Lymphs Abs: 1.4 10*3/uL (ref 0.7–4.0)
MCH: 30.8 pg (ref 26.0–34.0)
MCHC: 31.3 g/dL (ref 30.0–36.0)
MCV: 98.5 fL (ref 80.0–100.0)
Monocytes Absolute: 0.8 10*3/uL (ref 0.1–1.0)
Monocytes Relative: 9 %
Neutro Abs: 7.1 10*3/uL (ref 1.7–7.7)
Neutrophils Relative %: 76 %
Platelets: 212 10*3/uL (ref 150–400)
RBC: 3.31 MIL/uL — ABNORMAL LOW (ref 3.87–5.11)
RDW: 14 % (ref 11.5–15.5)
WBC: 9.4 10*3/uL (ref 4.0–10.5)
nRBC: 0 % (ref 0.0–0.2)

## 2024-01-13 LAB — APTT: aPTT: 31 s (ref 24–36)

## 2024-01-13 LAB — GLUCOSE, CAPILLARY: Glucose-Capillary: 116 mg/dL — ABNORMAL HIGH (ref 70–99)

## 2024-01-13 LAB — TROPONIN I (HIGH SENSITIVITY)
Troponin I (High Sensitivity): 9 ng/L (ref <18)
Troponin I (High Sensitivity): 12 ng/L (ref <18)

## 2024-01-13 LAB — PROTIME-INR
INR: 1.1 (ref 0.8–1.2)
Prothrombin Time: 14.1 s (ref 11.4–15.2)

## 2024-01-13 LAB — TSH: TSH: 0.027 u[IU]/mL — ABNORMAL LOW (ref 0.350–4.500)

## 2024-01-13 LAB — MAGNESIUM: Magnesium: 2.2 mg/dL (ref 1.7–2.4)

## 2024-01-13 MED ORDER — METOPROLOL TARTRATE 5 MG/5ML IV SOLN
2.5000 mg | Freq: Once | INTRAVENOUS | Status: AC
  Administered 2024-01-13: 2.5 mg via INTRAVENOUS
  Filled 2024-01-13: qty 5

## 2024-01-13 MED ORDER — CHLORHEXIDINE GLUCONATE CLOTH 2 % EX PADS
6.0000 | MEDICATED_PAD | Freq: Every day | CUTANEOUS | Status: DC
  Administered 2024-01-14 – 2024-01-15 (×2): 6 via TOPICAL

## 2024-01-13 MED ORDER — INSULIN ASPART 100 UNIT/ML IJ SOLN: 0.0000 [IU] | Freq: Every day | INTRAMUSCULAR | Status: DC

## 2024-01-13 MED ORDER — METOPROLOL TARTRATE 50 MG PO TABS
75.0000 mg | ORAL_TABLET | Freq: Two times a day (BID) | ORAL | Status: DC
  Administered 2024-01-14 – 2024-01-15 (×4): 75 mg via ORAL
  Filled 2024-01-13 (×4): qty 1

## 2024-01-13 MED ORDER — PROPOFOL 10 MG/ML IV BOLUS
0.5000 mg/kg | Freq: Once | INTRAVENOUS | Status: AC
  Administered 2024-01-13: 24.5 mg via INTRAVENOUS
  Filled 2024-01-13: qty 20

## 2024-01-13 MED ORDER — FLUTICASONE FUROATE-VILANTEROL 100-25 MCG/ACT IN AEPB
1.0000 | INHALATION_SPRAY | Freq: Every day | RESPIRATORY_TRACT | Status: DC
  Administered 2024-01-15: 1 via RESPIRATORY_TRACT
  Filled 2024-01-13: qty 28

## 2024-01-13 MED ORDER — HEPARIN (PORCINE) 25000 UT/250ML-% IV SOLN
900.0000 [IU]/h | INTRAVENOUS | Status: DC
  Administered 2024-01-13: 700 [IU]/h via INTRAVENOUS
  Filled 2024-01-13 (×2): qty 250

## 2024-01-13 MED ORDER — NEPRO/CARBSTEADY PO LIQD
237.0000 mL | Freq: Two times a day (BID) | ORAL | Status: DC
  Administered 2024-01-14: 237 mL via ORAL

## 2024-01-13 MED ORDER — SODIUM CHLORIDE 0.9 % IV BOLUS
500.0000 mL | Freq: Once | INTRAVENOUS | Status: AC
  Administered 2024-01-13: 500 mL via INTRAVENOUS

## 2024-01-13 MED ORDER — ACETAMINOPHEN 650 MG RE SUPP: 650.0000 mg | Freq: Four times a day (QID) | RECTAL | Status: DC | PRN

## 2024-01-13 MED ORDER — ACETAMINOPHEN 325 MG PO TABS: 650.0000 mg | ORAL_TABLET | Freq: Four times a day (QID) | ORAL | Status: DC | PRN

## 2024-01-13 MED ORDER — PROMETHAZINE HCL 12.5 MG PO TABS: 12.5000 mg | ORAL_TABLET | Freq: Four times a day (QID) | ORAL | Status: DC | PRN

## 2024-01-13 MED ORDER — CEPHALEXIN 250 MG PO CAPS
250.0000 mg | ORAL_CAPSULE | Freq: Every day | ORAL | Status: DC
Start: 2024-01-13 — End: 2024-01-15
  Administered 2024-01-13 – 2024-01-14 (×2): 250 mg via ORAL
  Filled 2024-01-13 (×2): qty 1

## 2024-01-13 MED ORDER — POLYETHYLENE GLYCOL 3350 17 G PO PACK: 17.0000 g | PACK | Freq: Every day | ORAL | Status: DC | PRN

## 2024-01-13 MED ORDER — ETOMIDATE 2 MG/ML IV SOLN: 0.1000 mg/kg | Freq: Once | INTRAVENOUS | Status: DC

## 2024-01-13 MED ORDER — POTASSIUM CHLORIDE CRYS ER 20 MEQ PO TBCR
40.0000 meq | EXTENDED_RELEASE_TABLET | Freq: Once | ORAL | Status: AC
Start: 2024-01-13 — End: 2024-01-13
  Administered 2024-01-13: 40 meq via ORAL
  Filled 2024-01-13: qty 2

## 2024-01-13 MED ORDER — HEPARIN BOLUS VIA INFUSION
2500.0000 [IU] | Freq: Once | INTRAVENOUS | Status: AC
  Administered 2024-01-13: 2500 [IU] via INTRAVENOUS

## 2024-01-13 MED ORDER — ALLOPURINOL 100 MG PO TABS
200.0000 mg | ORAL_TABLET | Freq: Every morning | ORAL | Status: DC
Start: 2024-01-14 — End: 2024-01-15
  Administered 2024-01-14 – 2024-01-15 (×2): 200 mg via ORAL
  Filled 2024-01-13 (×2): qty 2

## 2024-01-13 MED ORDER — LEVOTHYROXINE SODIUM 112 MCG PO TABS
112.0000 ug | ORAL_TABLET | Freq: Every day | ORAL | Status: DC
  Administered 2024-01-14: 112 ug via ORAL
  Filled 2024-01-13: qty 1

## 2024-01-13 MED ORDER — DILTIAZEM HCL-DEXTROSE 125-5 MG/125ML-% IV SOLN (PREMIX)
5.0000 mg/h | INTRAVENOUS | Status: DC
  Administered 2024-01-13: 5 mg/h via INTRAVENOUS
  Filled 2024-01-13: qty 125

## 2024-01-13 MED ORDER — INSULIN ASPART 100 UNIT/ML IJ SOLN
0.0000 [IU] | Freq: Three times a day (TID) | INTRAMUSCULAR | Status: DC
  Administered 2024-01-14: 1 [IU] via SUBCUTANEOUS

## 2024-01-13 NOTE — Assessment & Plan Note (Addendum)
 With hyperglycemia, blood sugar 249. - Hold Tradjenta while inpatient - SSI- S - HgbA1c

## 2024-01-13 NOTE — Assessment & Plan Note (Signed)
 Stable to slightly soft. - Switch home Toprol  XL 50mg  daily to metoprolol  75 twice daily for now, titrate for rate control

## 2024-01-13 NOTE — Assessment & Plan Note (Signed)
 Stable

## 2024-01-13 NOTE — H&P (Addendum)
 History and Physical    Lindsay Cortez:[id number] DOB: 1940/09/10 DOA: 01/13/2024  PCP: Kathyleen Parkins, MD   Patient coming from: Home  I have personally briefly reviewed patient's old medical records in [facility]  Chief Complaint: Dizziness  HPI: Lindsay Cortez is a 83 y.o. female with medical history significant for ESRD on dialysis, COPD, diabetes mellitus,. Patient presented to the ED with complaints of sudden onset of dizziness, palpitations.  About an hour prior to arrival to the ED, symptoms started.  She reports chest tightness without chest pain, and mild difficulty breathing.  Reports lightheadedness, but she did not pass out.  No lower extremity swelling.  Last dialysis was on Friday, she has not missed any sessions. No prior history of abnormal heart rhythms.   ED Course: Tachycardic to 149, temperature 97.6.  Respiratory rate 17-20.  Blood pressure systolic 105-131.  O2 sats greater than 95% on room air. Troponin 9 > 12.  Potassium 3.5.  Magnesium 2.2. EKG showing atrial fibrillation with rate of 145. IV metoprolol  2.5 mg given without improvement in heart rate. Propofol  given for sedation, and then cardioversion attempted in the ED twice, unsuccessful. Cardizem and heparin  drip, started.  500 mL bolus given.  Review of Systems: As per HPI all other systems reviewed and negative.  Past Medical History:  Diagnosis Date   Arthritis    Asthma    Back pain    COPD (chronic obstructive pulmonary disease) (HCC)    Diabetes (HCC) 09/01/2014   Diabetes mellitus    x 15 yrs   Gout    Hypertension    Kidney stones    Pancreatic cyst    Pneumonia    Renal disorder    Renal insufficiency    Shoulder dislocation    Vertigo     Past Surgical History:  Procedure Laterality Date   ABDOMINAL HYSTERECTOMY     AV FISTULA PLACEMENT Left 11/21/2022   Procedure: LEFT ARM ARTERIOVENOUS (AV) FISTULA CREATION;  Surgeon: Mayo Speck, MD;  Location: [facility];  Service:  Vascular;  Laterality: Left;   BLADDER SURGERY     bladder tac   BREAST SURGERY Left    benign   FOOT SURGERY Bilateral    bunions     reports that she has never smoked. She has never used smokeless tobacco. She reports that she does not drink alcohol and does not use drugs.  Allergies  Allergen Reactions   Codeine Nausea And Vomiting    Narcotic pain medicine makes her nauseated.  Says was told to only take tylenol  due to kidney disease.   Fentanyl  Nausea And Vomiting   Levaquin  [Levofloxacin  In D5w] Nausea And Vomiting   Levofloxacin  Nausea And Vomiting    Family History  Problem Relation Age of Onset   Breast cancer Mother    Emphysema Father    COPD Sister    Atrial fibrillation Sister    Obesity Brother    Cirrhosis Daughter    COPD Son    Diabetes Brother    Asthma Brother     Prior to Admission medications   Medication Sig Start Date End Date Taking? Authorizing Provider  allopurinol  (ZYLOPRIM ) 100 MG tablet Take 200 mg by mouth in the morning.    [provider]  amLODipine  (NORVASC ) 5 MG tablet Take 5 mg by mouth in the morning.    [provider]  BREO ELLIPTA  100-25 MCG/ACT AEPB Inhale 1 puff into the lungs daily. 05/29/23  [provider]  Calcium Carb-Cholecalciferol  (CALCIUM 600 + D PO) Take 1 tablet by mouth every evening.    [provider]  cephALEXin  (KEFLEX ) 250 MG capsule Take 250 mg by mouth at bedtime. 12/29/23   [provider]  Cholecalciferol  (VITAMIN D ) 2000 UNITS CAPS Take 2,000 Units by mouth every evening.    [provider]  furosemide  (LASIX ) 20 MG tablet Take 40 mg by mouth daily. 03/25/21   [provider]  levothyroxine  (SYNTHROID ) 112 MCG tablet Take 112 mcg by mouth daily before breakfast. 06/27/23   [provider]  lidocaine -prilocaine (EMLA) cream Apply 1 Application topically once. 03/19/23   [provider]  loperamide  (IMODIUM ) 2 MG capsule Take 6 mg by  mouth in the morning.    [provider]  metoprolol  succinate (TOPROL -XL) 50 MG 24 hr tablet Take 50 mg by mouth in the morning. 03/10/19   [provider]  mirtazapine  (REMERON ) 15 MG tablet Take 15 mg by mouth at bedtime. 11/01/23   [provider]  Multiple Vitamins-Minerals (HAIR SKIN NAILS PO) Take 1 tablet by mouth in the morning.    [provider]  simvastatin  (ZOCOR ) 10 MG tablet Take 10 mg by mouth at bedtime.    [provider]  TRADJENTA 5 MG TABS tablet Take 5 mg by mouth. 03/05/23   [provider]  zinc sulfate 220 (50 Zn) MG capsule Take 220 mg by mouth in the morning.    [provider]    Physical Exam: Vitals:   01/13/24 1731 01/13/24 1737 01/13/24 1742 01/13/24 1746  BP: 132/76 (!) 131/94 105/66 111/76  Pulse: (!) 138 (!) 141 (!) 142 (!) 137  Resp: 20 18 18 18   Temp:      TempSrc: Oral     SpO2: 99% 99% 99% 99%  Weight:      Height:        Constitutional: NAD, calm, comfortable Vitals:   01/13/24 1731 01/13/24 1737 01/13/24 1742 01/13/24 1746  BP: 132/76 (!) 131/94 105/66 111/76  Pulse: (!) 138 (!) 141 (!) 142 (!) 137  Resp: 20 18 18 18   Temp:      TempSrc: Oral     SpO2: 99% 99% 99% 99%  Weight:      Height:       Eyes: PERRL, lids and conjunctivae normal ENMT: Mucous membranes are moist.  Neck: normal, supple, no masses, no thyromegaly Respiratory: clear to auscultation bilaterally, no wheezing, no crackles. Normal respiratory effort. No accessory muscle use.  Cardiovascular: Irregular rate and rhythm, known 3/6 murmurs / rubs / gallops. No extremity edema.   Extremities warm. Abdomen: no tenderness, no masses palpated. No hepatosplenomegaly. Bowel sounds positive.  Musculoskeletal: no clubbing / cyanosis. No joint deformity upper and lower extremities.  Skin: no rashes, lesions, ulcers. No induration Neurologic: No facial asymmetry, moving extremities spontaneously, speech fluent.   Psychiatric: Normal judgment and insight. Alert and oriented x 3. Normal mood.   Labs on Admission: I have personally reviewed following labs and imaging studies  CBC: Recent Labs  Lab 01/13/24 1522  WBC 9.4  NEUTROABS 7.1  HGB 10.2*  HCT 32.6*  MCV 98.5  PLT 212   Basic Metabolic Panel: Recent Labs  Lab 01/13/24 1522  NA 134*  K 3.5  CL 102  CO2 22  GLUCOSE 249*  BUN 45*  CREATININE 4.15*  CALCIUM 8.9  MG 2.2   GFR: Estimated Creatinine Clearance: 6.9 mL/min (A) (by C-G formula  based on SCr of 4.15 mg/dL (H)). Liver Function Tests: Recent Labs  Lab 01/13/24 1522  AST 36  ALT 23  ALKPHOS 88  BILITOT 0.8  PROT 6.2*  ALBUMIN  3.4*   Urine analysis:    Component Value Date/Time   COLORURINE STRAW (A) 06/11/2023 1726   APPEARANCEUR CLEAR 06/11/2023 1726   LABSPEC 1.004 (L) 06/11/2023 1726   PHURINE 8.0 06/11/2023 1726   GLUCOSEU NEGATIVE 06/11/2023 1726   HGBUR NEGATIVE 06/11/2023 1726   BILIRUBINUR NEGATIVE 06/11/2023 1726   BILIRUBINUR negative 06/05/2023 1010   KETONESUR NEGATIVE 06/11/2023 1726   PROTEINUR 100 (A) 06/11/2023 1726   UROBILINOGEN 0.2 06/05/2023 1010   UROBILINOGEN 0.2 09/01/2014 1442   NITRITE NEGATIVE 06/11/2023 1726   LEUKOCYTESUR SMALL (A) 06/11/2023 1726    Radiological Exams on Admission: DG Chest Portable 1 View Result Date: 01/13/2024 CLINICAL DATA:  Dizziness. EXAM: PORTABLE CHEST 1 VIEW COMPARISON:  June 11, 2023. FINDINGS: Mild cardiomegaly is noted. Right internal jugular dialysis catheter is unchanged. Lungs are clear. Bony thorax is unremarkable. IMPRESSION: No active disease. Electronically Signed   By: Rosalene Colon M.D.   On: 01/13/2024 15:38   EKG: Independently reviewed.  Atrial fibrillation, rate 145, QTc prolonged at 521.  Old RBBB.  Assessment/Plan Principal Problem:   Atrial fibrillation with RVR (HCC) Active Problems:   COPD (chronic obstructive pulmonary disease) (HCC)   Diabetes mellitus (HCC)    Hypothyroidism   Essential hypertension   Prolonged QT interval   ESRD on dialysis (HCC)  Assessment and Plan: * Atrial fibrillation with RVR (HCC) Tachycardic to 145.  Symptomatic with sudden onset today.  Denies prior history of atrial fibrillation similar symptoms.  Blood pressure systolic 105-131.  Last echo 2022-EF of 65 to 70%.  No history of GI bleed, denies frequent falls.  Sister- Leon Rajas at bedside also has atrial fibrillation. - CHADVASC- at least 5 for- age, gender, hypertension and diabetes -IV metoprolol  2.5mg  x 1, cardioversion X2 attempted in the ED- unsuccessful. -Continue Cardizem drip started in ED, rates improving -Echocardiogram -IV heparin  - Resume home metoprolol - 75mg  BID - Potassium 3.5, magnesium 1.7, troponin 9 > 12 - check TSH  ESRD on dialysis (HCC) Schedule - M/W/F.  No sign of volume overload.Has not missed any sessions.  Blood pressure stable/slightly soft.  Last HD session was this past Friday.  Still makes a good amount of urine. -Pls Consult nephrology in a.m. for dialysis.  Prolonged QT interval QT prolonged at 521.  Potassium 3.5.  Magnesium 2.2.  History of prolonged QT syndrome.  Essential hypertension Stable to slightly soft. - Switch home Toprol  XL 50mg  daily to metoprolol  75 twice daily for now, titrate for rate control   Hypothyroidism Resume Synthroid   Diabetes mellitus (HCC) With hyperglycemia, blood sugar 249. - Hold Tradjenta while inpatient - SSI- S - HgbA1c  COPD (chronic obstructive pulmonary disease) (HCC) Stable.  On prophylactic UTI treatment-on low-dose Keflex , resume  DVT prophylaxis: IV heparin  Code Status: DNR-ACP documents reviewed.Confirmed with patient and her sister Leon Rajas at bedside. Disposition Plan: ~ 2 days Consults called: None Admission status: INPT stepdown I certify that at the point of admission it is my clinical judgment that the patient will require inpatient hospital care spanning beyond 2 midnights  from the point of admission due to high intensity of service, high risk for further deterioration and high frequency of surveillance required.   CRITICAL CARE Performed by: Pati Bonine   Total critical care time: 70  minutes  Critical care time was exclusive of separately billable procedures and treating other patients.  Critical care was necessary to treat or prevent imminent or life-threatening deterioration.  Critical care was time spent personally by me on the following activities: development of treatment plan with patient and/or surrogate as well as nursing, discussions with consultants, evaluation of patient's response to treatment, examination of patient, obtaining history from patient or surrogate, ordering and performing treatments and interventions, ordering and review of laboratory studies, ordering and review of radiographic studies, pulse oximetry and re-evaluation of patient's condition.   Author: Pati Bonine, MD 01/13/2024 7:50 PM  For on call review [email or web address].

## 2024-01-13 NOTE — Assessment & Plan Note (Signed)
 QT prolonged at 521.  Potassium 3.5.  Magnesium 2.2.  History of prolonged QT syndrome.

## 2024-01-13 NOTE — ED Provider Notes (Signed)
 [place] EMERGENCY DEPARTMENT AT [facility] Provider Note   CSN: [id number] Arrival date & time: 01/13/24  1504     History  Chief Complaint  Patient presents with   Dizziness    Lindsay Cortez is a 83 y.o. female.  HPI 83 year old female with a history of diabetes, COPD, hypertension, ESRD on dialysis presents with near syncope and A-fib.  History is by patient and EMS.  To her knowledge, she has never had A-fib before.  She was feeling fine up until about an hour prior to arrival she started to feel very dizzy while on the commode.  She feels like there is a funny feeling in her chest and her heart feels like it is beating abnormally.  She did not actually pass out.  There is a discomfort in her chest and a little bit of dyspnea as well.  No recent illness such as fevers, cough.  She has chronic diarrhea that is unchanged.  Last went to dialysis 2 days ago on Friday and had a normal session.  EMS noted A-fib with RVR with normal to low normal blood pressures.  No meds have been given.  Home Medications Prior to Admission medications   Medication Sig Start Date End Date Taking? Authorizing Provider  allopurinol  (ZYLOPRIM ) 100 MG tablet Take 200 mg by mouth in the morning.   Yes [provider]  BREO ELLIPTA  100-25 MCG/ACT AEPB Inhale 1 puff into the lungs daily. 05/29/23  Yes [provider]  Calcium Carb-Cholecalciferol  (CALCIUM 600 + D PO) Take 1 tablet by mouth every evening.   Yes [provider]  cephALEXin  (KEFLEX ) 250 MG capsule Take 250 mg by mouth at bedtime. 12/29/23  Yes [provider]  Cholecalciferol  (VITAMIN D ) 2000 UNITS CAPS Take 2,000 Units by mouth every evening.   Yes [provider]  furosemide  (LASIX ) 20 MG tablet Take 20 mg by mouth daily as needed for fluid or edema. 03/25/21  Yes [provider]  levothyroxine  (SYNTHROID ) 112 MCG tablet Take 112 mcg by mouth daily before breakfast. 06/27/23  Yes  [provider]  loperamide  (IMODIUM ) 2 MG capsule Take 2 mg by mouth in the morning.   Yes [provider]  metoprolol  succinate (TOPROL -XL) 50 MG 24 hr tablet Take 50 mg by mouth in the morning. 03/10/19  Yes [provider]  simvastatin  (ZOCOR ) 10 MG tablet Take 10 mg by mouth at bedtime.   Yes [provider]  TRADJENTA 5 MG TABS tablet Take 5 mg by mouth daily. 03/05/23  Yes [provider]  zinc sulfate 220 (50 Zn) MG capsule Take 220 mg by mouth in the morning.   Yes [provider]      Allergies    Codeine, Fentanyl , Levaquin  [levofloxacin  in d5w], and Levofloxacin     Review of Systems   Review of Systems  Constitutional:  Negative for fever.  Respiratory:  Positive for shortness of breath. Negative for cough.   Cardiovascular:  Positive for chest pain and palpitations. Negative for leg swelling.  Neurological:  Positive for light-headedness. Negative for syncope.    Physical Exam Updated Vital Signs BP 106/77   Pulse (!) 119   Temp 97.6 F (36.4 C) (Oral)   Resp 16   Ht 4\' 11"  (1.499 m)   Wt 49 kg   SpO2 100%   BMI 21.82 kg/m  Physical Exam Vitals and nursing note reviewed.  Constitutional:      General: She is not  in acute distress.    Appearance: She is well-developed. She is not ill-appearing or diaphoretic.  HENT:     Head: Normocephalic and atraumatic.  Cardiovascular:     Rate and Rhythm: Tachycardia present. Rhythm irregular.     Heart sounds: Normal heart sounds.  Pulmonary:     Effort: Pulmonary effort is normal.     Breath sounds: Normal breath sounds. No wheezing or rales.  Abdominal:     General: There is no distension.  Musculoskeletal:     Right lower leg: No edema.     Left lower leg: No edema.  Skin:    General: Skin is warm and dry.  Neurological:     Mental Status: She is alert.     ED Results / Procedures / Treatments   Labs (all labs ordered are listed, but only abnormal results  are displayed) Labs Reviewed  COMPREHENSIVE METABOLIC PANEL WITH GFR - Abnormal; Notable for the following components:      Result Value   Sodium 134 (*)    Glucose, Bld 249 (*)    BUN 45 (*)    Creatinine, Ser 4.15 (*)    Total Protein 6.2 (*)    Albumin  3.4 (*)    GFR, Estimated 10 (*)    All other components within normal limits  CBC WITH DIFFERENTIAL/PLATELET - Abnormal; Notable for the following components:   RBC 3.31 (*)    Hemoglobin 10.2 (*)    HCT 32.6 (*)    All other components within normal limits  TSH - Abnormal; Notable for the following components:   TSH 0.027 (*)    All other components within normal limits  MRSA NEXT GEN BY PCR, NASAL  MRSA NEXT GEN BY PCR, NASAL  MAGNESIUM  PROTIME-INR  APTT  CBC  HEMOGLOBIN A1C  HEPARIN  LEVEL (UNFRACTIONATED)  BASIC METABOLIC PANEL WITH GFR  TROPONIN I (HIGH SENSITIVITY)  TROPONIN I (HIGH SENSITIVITY)    EKG EKG Interpretation Date/Time:  Sunday Jan 13 2024 15:17:55 EDT Ventricular Rate:  145 PR Interval:    QRS Duration:  132 QT Interval:  335 QTC Calculation: 521 R Axis:   148  Text Interpretation: Afib with RVR Right bundle branch block conduction delay is similar to Oct 2024 Confirmed by Jerilynn Montenegro [phone]) on 01/13/2024 3:21:37 PM  Radiology DG Chest Portable 1 View Result Date: 01/13/2024 CLINICAL DATA:  Dizziness. EXAM: PORTABLE CHEST 1 VIEW COMPARISON:  June 11, 2023. FINDINGS: Mild cardiomegaly is noted. Right internal jugular dialysis catheter is unchanged. Lungs are clear. Bony thorax is unremarkable. IMPRESSION: No active disease. Electronically Signed   By: Rosalene Colon M.D.   On: 01/13/2024 15:38    Procedures .Cardioversion  Date/Time: 01/13/2024 5:38 PM  Performed by: Jerilynn Montenegro, MD Authorized by: Jerilynn Montenegro, MD   Consent:    Consent obtained:  Verbal and written   Consent given by:  Patient Pre-procedure details:    Cardioversion basis:  Emergent   Rhythm:  Atrial  fibrillation   Electrode placement:  Anterior-posterior Patient sedated: Yes. Refer to sedation procedure documentation for details of sedation.  Attempt one:    Cardioversion mode:  Synchronous   Waveform:  Biphasic   Shock (Joules):  200   Shock outcome:  No change in rhythm Attempt two:    Cardioversion mode:  Synchronous   Waveform:  Biphasic   Shock (Joules):  200   Shock outcome:  No change in rhythm Post-procedure details:    Patient status:  Awake  Patient tolerance of procedure:  Tolerated well, no immediate complications .Sedation  Date/Time: 01/13/2024 5:38 PM  Performed by: Jerilynn Montenegro, MD Authorized by: Jerilynn Montenegro, MD   Consent:    Consent obtained:  Verbal   Consent given by:  Patient   Risks discussed:  Allergic reaction, dysrhythmia, inadequate sedation, nausea, prolonged hypoxia resulting in organ damage, prolonged sedation necessitating reversal, respiratory compromise necessitating ventilatory assistance and intubation and vomiting   Alternatives discussed:  Analgesia without sedation, anxiolysis and regional anesthesia Universal protocol:    Procedure explained and questions answered to patient or proxy's satisfaction: yes     Relevant documents present and verified: yes     Test results available: yes     Imaging studies available: yes     Required blood products, implants, devices, and special equipment available: yes     Site/side marked: yes     Immediately prior to procedure, a time out was called: yes     Patient identity confirmed:  Verbally with patient Indications:    Procedure performed:  Cardioversion   Procedure necessitating sedation performed by:  Physician performing sedation Pre-sedation assessment:    Time since last food or drink:  4 hours   NPO status caution: urgency dictates proceeding with non-ideal NPO status     ASA classification: class 3 - patient with severe systemic disease     Mouth opening:  3 or more finger  widths   Thyromental distance:  4 finger widths   Mallampati score:  I - soft palate, uvula, fauces, pillars visible   Neck mobility: normal     Pre-sedation assessments completed and reviewed: airway patency, cardiovascular function, hydration status, mental status, nausea/vomiting, pain level, respiratory function and temperature   A pre-sedation assessment was completed prior to the start of the procedure Immediate pre-procedure details:    Reassessment: Patient reassessed immediately prior to procedure     Reviewed: vital signs, relevant labs/tests and NPO status     Verified: bag valve mask available, emergency equipment available, intubation equipment available, IV patency confirmed, oxygen  available and suction available   Procedure details (see MAR for exact dosages):    Preoxygenation:  Nasal cannula   Sedation:  Propofol    Intended level of sedation: deep   Intra-procedure monitoring:  Blood pressure monitoring, cardiac monitor, continuous pulse oximetry, frequent LOC assessments, frequent vital sign checks and continuous capnometry   Intra-procedure events: none     Total Provider sedation time (minutes):  10 Post-procedure details:   A post-sedation assessment was completed following the completion of the procedure.   Attendance: Constant attendance by certified staff until patient recovered     Recovery: Patient returned to pre-procedure baseline     Post-sedation assessments completed and reviewed: airway patency, cardiovascular function, hydration status, mental status, nausea/vomiting, pain level, respiratory function and temperature     Patient is stable for discharge or admission: yes     Procedure completion:  Tolerated well, no immediate complications .Critical Care  Performed by: Jerilynn Montenegro, MD Authorized by: Jerilynn Montenegro, MD   Critical care provider statement:    Critical care time (minutes):  45   Critical care time was exclusive of:  Separately billable  procedures and treating other patients   Critical care was necessary to treat or prevent imminent or life-threatening deterioration of the following conditions:  Cardiac failure   Critical care was time spent personally by me on the following activities:  Development of treatment plan with patient or surrogate, discussions with  consultants, evaluation of patient's response to treatment, examination of patient, ordering and review of laboratory studies, ordering and review of radiographic studies, ordering and performing treatments and interventions, pulse oximetry, re-evaluation of patient's condition and review of old charts     Medications Ordered in ED Medications  diltiazem (CARDIZEM) 125 mg in dextrose  5% 125 mL (1 mg/mL) infusion (10 mg/hr Intravenous Infusion Verify 01/13/24 1856)  heparin  bolus via infusion 2,500 Units (2,500 Units Intravenous Bolus from Bag 01/13/24 1930)    Followed by  heparin  ADULT infusion 100 units/mL (25000 units/250mL) (700 Units/hr Intravenous New Bag/Given 01/13/24 1930)  Chlorhexidine  Gluconate Cloth 2 % PADS 6 each (has no administration in time range)  potassium chloride  SA (KLOR-CON  M) CR tablet 40 mEq (has no administration in time range)  allopurinol  (ZYLOPRIM ) tablet 200 mg (has no administration in time range)  fluticasone  furoate-vilanterol (BREO ELLIPTA ) 100-25 MCG/ACT 1 puff (has no administration in time range)  cephALEXin  (KEFLEX ) capsule 250 mg (has no administration in time range)  levothyroxine  (SYNTHROID ) tablet 112 mcg (has no administration in time range)  metoprolol  tartrate (LOPRESSOR ) tablet 75 mg (has no administration in time range)  insulin  aspart (novoLOG ) injection 0-9 Units (has no administration in time range)  insulin  aspart (novoLOG ) injection 0-5 Units (has no administration in time range)  acetaminophen  (TYLENOL ) tablet 650 mg (has no administration in time range)    Or  acetaminophen  (TYLENOL ) suppository 650 mg (has no  administration in time range)  polyethylene glycol (MIRALAX / GLYCOLAX) packet 17 g (has no administration in time range)  promethazine  (PHENERGAN ) tablet 12.5 mg (has no administration in time range)  sodium chloride  0.9 % bolus 500 mL (500 mLs Intravenous New Bag/Given 01/13/24 1626)  metoprolol  tartrate (LOPRESSOR ) injection 2.5 mg (2.5 mg Intravenous Given 01/13/24 1621)  propofol  (DIPRIVAN ) 10 mg/mL bolus/IV push 24.5 mg (24.5 mg Intravenous Given 01/13/24 1750)    ED Course/ Medical Decision Making/ A&P                                 Medical Decision Making Amount and/or Complexity of Data Reviewed Labs: ordered.    Details: Normal troponins x 2 Radiology: ordered and independent interpretation performed.    Details: No CHF ECG/medicine tests: ordered and independent interpretation performed.    Details: A fib with RVR  Risk Prescription drug management. Decision regarding hospitalization.   Patient presents with new onset A-fib with RVR.  Prior to this she has no prior history of it and it seems like she certainly became symptomatic this afternoon.  She initially was hesitant about cardioversion so I I gave her a dose of metoprolol  and some gentle fluids.  However her heart rate remains elevated and her blood pressures are soft after this.  Discussed options and she would at least like to try cardioversion which unfortunately has failed after 2 attempts.  Given this, will stop further attempts and put her on Cardizem and she would likely need a cardiology consult in the morning.  Will also start heparin .  Patient's troponins are normal.  No evidence of CHF.  Patient woke up appropriately after sedation.  Discussed with Dr. Quintella Buck for admission.        Final Clinical Impression(s) / ED Diagnoses Final diagnoses:  Atrial fibrillation with RVR (HCC)    Rx / DC Orders ED Discharge Orders     None         Aldean Amass,  Geralyn Knee, MD 01/13/24 2026

## 2024-01-13 NOTE — Assessment & Plan Note (Addendum)
 Tachycardic to 145.  Symptomatic with sudden onset today.  Denies prior history of atrial fibrillation similar symptoms.  Blood pressure systolic 105-131.  Last echo 2022-EF of 65 to 70%.  No history of GI bleed, denies frequent falls.  Sister- Leon Rajas at bedside also has atrial fibrillation. - CHADVASC- at least 5 for- age, gender, hypertension and diabetes -IV metoprolol  2.5mg  x 1, cardioversion X2 attempted in the ED- unsuccessful. -Continue Cardizem drip started in ED, rates improving -Echocardiogram -IV heparin  - Resume home metoprolol - 75mg  BID - Potassium 3.5, magnesium 1.7, troponin 9 > 12 - check TSH

## 2024-01-13 NOTE — ED Triage Notes (Signed)
 Ems called out for dizziness and near syncope. Pt is dialysis pt and has it scheduled for tomorrow. Pt's heart rate ranging from 130-160s.

## 2024-01-13 NOTE — ED Notes (Signed)
 Attempted report x1.

## 2024-01-13 NOTE — Assessment & Plan Note (Signed)
Resume Synthroid ?

## 2024-01-13 NOTE — ED Notes (Signed)
 ED TO INPATIENT HANDOFF REPORT  ED Nurse Name and Phone #: Shi Blankenship  S Name/Age/Gender Lindsay Cortez 83 y.o. female Room/Bed: APA09/APA09  Code Status   Code Status: Prior  Home/SNF/Other Home Patient oriented to: self, place, time, and situation Is this baseline? Yes   Triage Complete: Triage complete  Chief Complaint Atrial fibrillation with RVR (HCC) [I48.91]  Triage Note Ems called out for dizziness and near syncope. Pt is dialysis pt and has it scheduled for tomorrow. Pt's heart rate ranging from 130-160s.    Allergies Allergies  Allergen Reactions   Codeine Nausea And Vomiting    Narcotic pain medicine makes her nauseated.  Says was told to only take tylenol  due to kidney disease.   Fentanyl  Nausea And Vomiting   Levaquin  [Levofloxacin  In D5w] Nausea And Vomiting   Levofloxacin  Nausea And Vomiting    Level of Care/Admitting Diagnosis ED Disposition     ED Disposition  Admit   Condition  --   Comment  Hospital Area: [facility] [[id number]]  Level of Care: Stepdown [14]  Covid Evaluation: Asymptomatic - no recent exposure (last 10 days) testing not required  Diagnosis: Atrial fibrillation with RVR [facility]) [[id number]]  Admitting Physician: EMOKPAE, EJIROGHENE E [[id number]]  Attending Physician: EMOKPAE, EJIROGHENE E [phone]  Certification:: I certify this patient will need inpatient services for at least 2 midnights  Expected Medical Readiness: 01/15/2024          B Medical/Surgery History Past Medical History:  Diagnosis Date   Arthritis    Asthma    Back pain    COPD (chronic obstructive pulmonary disease) (HCC)    Diabetes (HCC) 09/01/2014   Diabetes mellitus    x 15 yrs   Gout    Hypertension    Kidney stones    Pancreatic cyst    Pneumonia    Renal disorder    Renal insufficiency    Shoulder dislocation    Vertigo    Past Surgical History:  Procedure Laterality Date   ABDOMINAL HYSTERECTOMY     AV FISTULA PLACEMENT Left 11/21/2022    Procedure: LEFT ARM ARTERIOVENOUS (AV) FISTULA CREATION;  Surgeon: Mayo Speck, MD;  Location: [facility];  Service: Vascular;  Laterality: Left;   BLADDER SURGERY     bladder tac   BREAST SURGERY Left    benign   FOOT SURGERY Bilateral    bunions     A IV Location/Drains/Wounds Patient Lines/Drains/Airways Status     Active Line/Drains/Airways     Name Placement date Placement time Site Days   Peripheral IV 01/13/24 20 G Right Antecubital 01/13/24  1521  Antecubital  less than 1   Fistula / Graft Left Forearm Arteriovenous fistula 11/21/22  1055  Forearm  418            Intake/Output Last 24 hours No intake or output data in the 24 hours ending 01/13/24 1829  Labs/Imaging Results for orders placed or performed during the hospital encounter of 01/13/24 (from the past 48 hours)  Comprehensive metabolic panel     Status: Abnormal   Collection Time: 01/13/24  3:22 PM  Result Value Ref Range   Sodium 134 (L) 135 - 145 mmol/L   Potassium 3.5 3.5 - 5.1 mmol/L   Chloride 102 98 - 111 mmol/L   CO2 22 22 - 32 mmol/L   Glucose, Bld 249 (H) 70 - 99 mg/dL    Comment: Glucose reference range applies only to samples taken after fasting for at  least 8 hours.   BUN 45 (H) 8 - 23 mg/dL   Creatinine, Ser 8.11 (H) 0.44 - 1.00 mg/dL   Calcium 8.9 8.9 - 91.4 mg/dL   Total Protein 6.2 (L) 6.5 - 8.1 g/dL   Albumin  3.4 (L) 3.5 - 5.0 g/dL   AST 36 15 - 41 U/L   ALT 23 0 - 44 U/L   Alkaline Phosphatase 88 38 - 126 U/L   Total Bilirubin 0.8 0.0 - 1.2 mg/dL   GFR, Estimated 10 (L) >60 mL/min    Comment: (NOTE) Calculated using the CKD-EPI Creatinine Equation (2021)    Anion gap 10 5 - 15    Comment: Performed at [facility], [address] [place], [place] [address]  Troponin I (High Sensitivity)     Status: None   Collection Time: 01/13/24  3:22 PM  Result Value Ref Range   Troponin I (High Sensitivity) 9 <18 ng/L    Comment: (NOTE) Elevated high sensitivity troponin I (hsTnI) values  and significant  changes across serial measurements may suggest ACS but many other  chronic and acute conditions are known to elevate hsTnI results.  Refer to the "Links" section for chest pain algorithms and additional  guidance. Performed at [facility], [address] [place], [place] [address]   CBC with Differential     Status: Abnormal   Collection Time: 01/13/24  3:22 PM  Result Value Ref Range   WBC 9.4 4.0 - 10.5 K/uL   RBC 3.31 (L) 3.87 - 5.11 MIL/uL   Hemoglobin 10.2 (L) 12.0 - 15.0 g/dL   HCT 86.5 (L) 78.4 - 69.6 %   MCV 98.5 80.0 - 100.0 fL   MCH 30.8 26.0 - 34.0 pg   MCHC 31.3 30.0 - 36.0 g/dL   RDW 29.5 28.4 - 13.2 %   Platelets 212 150 - 400 K/uL   nRBC 0.0 0.0 - 0.2 %   Neutrophils Relative % 76 %   Neutro Abs 7.1 1.7 - 7.7 K/uL   Lymphocytes Relative 14 %   Lymphs Abs 1.4 0.7 - 4.0 K/uL   Monocytes Relative 9 %   Monocytes Absolute 0.8 0.1 - 1.0 K/uL   Eosinophils Relative 1 %   Eosinophils Absolute 0.1 0.0 - 0.5 K/uL   Basophils Relative 0 %   Basophils Absolute 0.0 0.0 - 0.1 K/uL   Immature Granulocytes 0 %   Abs Immature Granulocytes 0.02 0.00 - 0.07 K/uL    Comment: Performed at [facility], [address], [place], [place] [address]  Magnesium     Status: None   Collection Time: 01/13/24  3:22 PM  Result Value Ref Range   Magnesium 2.2 1.7 - 2.4 mg/dL    Comment: Performed at [facility], [address], [place], [place] [address]  Troponin I (High Sensitivity)     Status: None   Collection Time: 01/13/24  4:55 PM  Result Value Ref Range   Troponin I (High Sensitivity) 12 <18 ng/L    Comment: (NOTE) Elevated high sensitivity troponin I (hsTnI) values and significant  changes across serial measurements may suggest ACS but many other  chronic and acute conditions are known to elevate hsTnI results.  Refer to the "Links" section for chest pain algorithms and additional  guidance. Performed at [facility], [address] [place], [place]  [address]    DG Chest Portable 1 View Result Date: 01/13/2024 CLINICAL DATA:  Dizziness. EXAM: PORTABLE CHEST 1 VIEW COMPARISON:  June 11, 2023.  FINDINGS: Mild cardiomegaly is noted. Right internal jugular dialysis catheter is unchanged. Lungs are clear. Bony thorax is unremarkable. IMPRESSION: No active disease. Electronically Signed   By: Rosalene Colon M.D.   On: 01/13/2024 15:38    Pending Labs Unresulted Labs (From admission, onward)     Start     Ordered   01/13/24 1820  Protime-INR  ONCE - STAT,   STAT        01/13/24 1820   01/13/24 1820  APTT  ONCE - STAT,   STAT        01/13/24 1820            Vitals/Pain Today's Vitals   01/13/24 1742 01/13/24 1746 01/13/24 1800 01/13/24 1825  BP: 105/66 111/76 (!) 123/98 106/77  Pulse: (!) 142 (!) 137 (!) 125 (!) 119  Resp: 18 18 (!) 32 16  Temp:      TempSrc:      SpO2: 99% 99% 98% 100%  Weight:      Height:      PainSc:        Isolation Precautions No active isolations  Medications Medications  diltiazem (CARDIZEM) 125 mg in dextrose  5% 125 mL (1 mg/mL) infusion (5 mg/hr Intravenous New Bag/Given 01/13/24 1750)  sodium chloride  0.9 % bolus 500 mL (500 mLs Intravenous New Bag/Given 01/13/24 1626)  metoprolol  tartrate (LOPRESSOR ) injection 2.5 mg (2.5 mg Intravenous Given 01/13/24 1621)  propofol  (DIPRIVAN ) 10 mg/mL bolus/IV push 24.5 mg (24.5 mg Intravenous Given 01/13/24 1750)    Mobility walks with device     Focused Assessments Cardiac Assessment Handoff:  Cardiac Rhythm: Atrial fibrillation  Does the Patient currently have chest pain? No    R Recommendations: See Admitting Provider Note  Report given to:   Additional Notes:

## 2024-01-13 NOTE — Assessment & Plan Note (Addendum)
 Schedule - M/W/F.  No sign of volume overload.Has not missed any sessions.  Blood pressure stable/slightly soft.  Last HD session was this past Friday.  Still makes a good amount of urine. -Pls Consult nephrology in a.m. for dialysis.

## 2024-01-13 NOTE — Progress Notes (Signed)
 PHARMACY - ANTICOAGULATION CONSULT NOTE  Pharmacy Consult for Heparin  Infusion  Indication: atrial fibrillation  Allergies  Allergen Reactions   Codeine Nausea And Vomiting    Narcotic pain medicine makes her nauseated.  Says was told to only take tylenol  due to kidney disease.   Fentanyl  Nausea And Vomiting   Levaquin  [Levofloxacin  In D5w] Nausea And Vomiting   Levofloxacin  Nausea And Vomiting    Patient Measurements: Height: 4\' 11"  (149.9 cm) Weight: 49 kg (108 lb 0.4 oz) IBW/kg (Calculated) : 43.2 HEPARIN  DW (KG): 49  Vital Signs: Temp: 97.6 F (36.4 C) (05/18 1511) Temp Source: Oral (05/18 1731) BP: 123/98 (05/18 1800) Pulse Rate: 125 (05/18 1800)  Labs: Recent Labs    01/13/24 1522 01/13/24 1655  HGB 10.2*  --   HCT 32.6*  --   PLT 212  --   CREATININE 4.15*  --   TROPONINIHS 9 12    Estimated Creatinine Clearance: 6.9 mL/min (A) (by C-G formula based on SCr of 4.15 mg/dL (H)).   Medical History: Past Medical History:  Diagnosis Date   Arthritis    Asthma    Back pain    COPD (chronic obstructive pulmonary disease) (HCC)    Diabetes (HCC) 09/01/2014   Diabetes mellitus    x 15 yrs   Gout    Hypertension    Kidney stones    Pancreatic cyst    Pneumonia    Renal disorder    Renal insufficiency    Shoulder dislocation    Vertigo      Assessment: 83 yo female admitted with near syncope and new A. Fib with RVR. Patient has PMH of ESRD, COPD, HTN, and diabetes. Pharmacy has been consulted for heparin  dosing and monitoring.   Goal of Therapy:  Heparin  level 0.3-0.7 units/ml Monitor platelets by anticoagulation protocol: Yes   Plan:  Give 2500 units bolus x 1 Start heparin  infusion at 700 units/hr Check anti-Xa level in 8 hours and daily while on heparin  Continue to monitor H&H and platelets  Malone Sear, PharmD, BCPS Clinical Pharmacist 01/13/2024 6:31 PM

## 2024-01-14 ENCOUNTER — Other Ambulatory Visit (HOSPITAL_COMMUNITY): Payer: Self-pay | Admitting: *Deleted

## 2024-01-14 ENCOUNTER — Inpatient Hospital Stay (HOSPITAL_COMMUNITY)

## 2024-01-14 DIAGNOSIS — I4891 Unspecified atrial fibrillation: Secondary | ICD-10-CM

## 2024-01-14 LAB — CBC
HCT: 29.4 % — ABNORMAL LOW (ref 36.0–46.0)
Hemoglobin: 9.2 g/dL — ABNORMAL LOW (ref 12.0–15.0)
MCH: 31 pg (ref 26.0–34.0)
MCHC: 31.3 g/dL (ref 30.0–36.0)
MCV: 99 fL (ref 80.0–100.0)
Platelets: 176 10*3/uL (ref 150–400)
RBC: 2.97 MIL/uL — ABNORMAL LOW (ref 3.87–5.11)
RDW: 14.3 % (ref 11.5–15.5)
WBC: 6.7 10*3/uL (ref 4.0–10.5)
nRBC: 0 % (ref 0.0–0.2)

## 2024-01-14 LAB — BASIC METABOLIC PANEL WITH GFR
Anion gap: 6 (ref 5–15)
BUN: 51 mg/dL — ABNORMAL HIGH (ref 8–23)
CO2: 23 mmol/L (ref 22–32)
Calcium: 8.3 mg/dL — ABNORMAL LOW (ref 8.9–10.3)
Chloride: 109 mmol/L (ref 98–111)
Creatinine, Ser: 4.32 mg/dL — ABNORMAL HIGH (ref 0.44–1.00)
GFR, Estimated: 10 mL/min — ABNORMAL LOW (ref >60)
Glucose, Bld: 107 mg/dL — ABNORMAL HIGH (ref 70–99)
Potassium: 3.7 mmol/L (ref 3.5–5.1)
Sodium: 138 mmol/L (ref 135–145)

## 2024-01-14 LAB — HEMOGLOBIN A1C
Hgb A1c MFr Bld: 4.8 % (ref 4.8–5.6)
Mean Plasma Glucose: 91.06 mg/dL

## 2024-01-14 LAB — ECHOCARDIOGRAM COMPLETE
AR max vel: 1.48 cm2
AV Area VTI: 1.61 cm2
AV Area mean vel: 1.51 cm2
AV Mean grad: 6.1 mmHg
AV Peak grad: 13.3 mmHg
Ao pk vel: 1.83 m/s
Area-P 1/2: 3.77 cm2
Height: 59 in
MV M vel: 4.89 m/s
MV Peak grad: 95.6 mmHg
S' Lateral: 2.3 cm
Weight: 1728.41 [oz_av]

## 2024-01-14 LAB — GLUCOSE, CAPILLARY
Glucose-Capillary: 109 mg/dL — ABNORMAL HIGH (ref 70–99)
Glucose-Capillary: 70 mg/dL (ref 70–99)
Glucose-Capillary: 145 mg/dL — ABNORMAL HIGH (ref 70–99)
Glucose-Capillary: 104 mg/dL — ABNORMAL HIGH (ref 70–99)

## 2024-01-14 LAB — MRSA NEXT GEN BY PCR, NASAL: MRSA by PCR Next Gen: NOT DETECTED

## 2024-01-14 LAB — HEPARIN LEVEL (UNFRACTIONATED)
Heparin Unfractionated: 0.21 [IU]/mL — ABNORMAL LOW (ref 0.30–0.70)
Heparin Unfractionated: 0.28 [IU]/mL — ABNORMAL LOW (ref 0.30–0.70)
Heparin Unfractionated: 0.84 [IU]/mL — ABNORMAL HIGH (ref 0.30–0.70)

## 2024-01-14 MED ORDER — ALTEPLASE 2 MG IJ SOLR: 2.0000 mg | Freq: Once | INTRAMUSCULAR | Status: DC | PRN

## 2024-01-14 MED ORDER — LEVOTHYROXINE SODIUM 88 MCG PO TABS: 88.0000 ug | ORAL_TABLET | Freq: Every day | ORAL | Status: DC

## 2024-01-14 MED ORDER — NEPRO/CARBSTEADY PO LIQD: 237.0000 mL | ORAL | Status: DC | PRN

## 2024-01-14 MED ORDER — DARBEPOETIN ALFA 40 MCG/0.4ML IJ SOSY
40.0000 ug | PREFILLED_SYRINGE | INTRAMUSCULAR | Status: DC
  Administered 2024-01-14: 40 ug via SUBCUTANEOUS
  Filled 2024-01-14: qty 0.4

## 2024-01-14 MED ORDER — HEPARIN SODIUM (PORCINE) 1000 UNIT/ML DIALYSIS: 20.0000 [IU]/kg | INTRAMUSCULAR | Status: DC | PRN

## 2024-01-14 MED ORDER — HEPARIN SODIUM (PORCINE) 1000 UNIT/ML DIALYSIS: 1000.0000 [IU] | INTRAMUSCULAR | Status: DC | PRN

## 2024-01-14 MED ORDER — CHLORHEXIDINE GLUCONATE CLOTH 2 % EX PADS
6.0000 | MEDICATED_PAD | Freq: Every day | CUTANEOUS | Status: DC
  Administered 2024-01-14 – 2024-01-15 (×2): 6 via TOPICAL

## 2024-01-14 NOTE — Progress Notes (Addendum)
 PHARMACY - ANTICOAGULATION Pharmacy Consult for Heparin  Infusion  Indication: atrial fibrillation Brief A/P: Heparin  level supratherapeutic No change to heparin  for now    Patient Measurements: Height: 4\' 11"  (149.9 cm) Weight: 49.1 kg (108 lb 3.9 oz) IBW/kg (Calculated) : 43.2 HEPARIN  DW (KG): 49  Vital Signs: Temp: 97.9 F (36.6 C) (05/19 2100) Temp Source: Oral (05/19 2100) BP: 119/38 (05/19 2200) Pulse Rate: 66 (05/19 2200)  Labs: Recent Labs    01/13/24 1522 01/13/24 1655 01/14/24 0443 01/14/24 1334 01/14/24 2150  HGB 10.2*  --  9.2*  --   --   HCT 32.6*  --  29.4*  --   --   PLT 212  --  176  --   --   APTT 31  --   --   --   --   LABPROT 14.1  --   --   --   --   INR 1.1  --   --   --   --   HEPARINUNFRC  --   --  0.21* 0.28* 0.84*  CREATININE 4.15*  --  4.32*  --   --   TROPONINIHS 9 12  --   --   --     Estimated Creatinine Clearance: 6.6 mL/min (A) (by C-G formula based on SCr of 4.32 mg/dL (H)).  Assessment: 83 y.o. female with Afib for heparin   Goal of Therapy:  Heparin  level 0.3-0.7 units/ml Monitor platelets by anticoagulation protocol: Yes   Plan:  Heparin  level drawn immediately following HD so likely unreliable--will continue Heparin  at current rate for now and F/U am labs  Claudine Cullens, PharmD, BCPS  01/14/2024 11:45 PM

## 2024-01-14 NOTE — Progress Notes (Signed)
 PROGRESS NOTE   Lindsay Cortez, is a 83 y.o. female, DOB - 1941-04-27, WUJ:[id number]  Admit date - 01/13/2024   Admitting Physician Pati Bonine, MD  Outpatient Primary MD for the patient is Kathyleen Parkins, MD  LOS - 1  Chief Complaint  Patient presents with   Dizziness     Brief Narrative:   83 y.o. female with medical history significant for ESRD on dialysis, COPD, diabetes mellitus admitted on 01/13/2024 after presenting with dizziness and near syncope and is found to be in A-fib with RVR    -Assessment and Plan: 1)New Onset Atrial fibrillation with RVR  - Heart rate in the 140s at presentation - No further dizziness or near syncope = Patient initially failed DC cardioversion x 2 attempts in the ED - Rate control much improved on metoprolol  and IV Cardizem  - Appears to have converted to sinus at this time -Last echo in 2022-EF of 65 to 70%, repeat echo requested at this time - CHADVASC- at least 5 for- age, gender, hypertension and diabetes --c/n IV heparin , anticipate discharge on DOAC - Resume home metoprolol - 75mg  BID - Potassium 3.5, magnesium 1.7, troponin 9 > 12 =-TSH is 0.027, TSH was 0.185 on 03/02/2021----will reduce Levothyroxine  to 88 mcg from 112 mcg  2)ESRD on dialysis (HCC) Schedule - M/W/F.  No sign of volume overload.Has not missed any sessions.   -- Still makes a good amount of urine. - Nephrology consult for HD per MWF schedule appreciated  3)Hypothyroidism---TSH is 0.027, TSH was 0.185 on 03/02/2021----will reduce Levothyroxine  to 88 mcg from 112 mcg  4)Prolonged QT interval QT prolonged at 521.   History of prolonged QT syndrome. - Keep electrolytes within range  5)HTN -BP was borderline low on admission due to A-fib with RVR with poor perfusion -Continue IV Cardizem  drip -Continue metoprolol  75 mg twice daily - Switch home Toprol  XL 50mg  daily to metoprolol  75 twice daily for now, titrate for rate control  6)DM2-A1c was 5.1 reflecting  excellent diabetic control PTA Use Novolog /Humalog Sliding scale insulin  with Accu-Cheks/Fingersticks as ordered    7)COPD (chronic obstructive pulmonary disease) (HCC) Stable. - No flareup - Continue Breo Ellipta   8) chronic anemia of ESRD--- Hgb currently above 9 - Defer timing and dose of ESA agent/Procrit to nephrology team  Status is: Inpatient   Disposition: The patient is from: Home              Anticipated d/c is to: Home              Anticipated d/c date is: 1 day              Patient currently is not medically stable to d/c. Barriers: Not Clinically Stable-   Code Status :  -  Code Status: Limited: Do not attempt resuscitation (DNR) -DNR-LIMITED -Do Not Intubate/DNI    Family Communication:    NA (patient is alert, awake and coherent)   DVT Prophylaxis  :   - SCDs /Iv Heparin     Lab Results  Component Value Date   PLT 176 01/14/2024    Inpatient Medications  Scheduled Meds:  allopurinol   200 mg Oral q AM   cephALEXin   250 mg Oral QHS   Chlorhexidine  Gluconate Cloth  6 each Topical Q0600   Chlorhexidine  Gluconate Cloth  6 each Topical Q0600   darbepoetin (ARANESP ) injection - DIALYSIS  40 mcg Subcutaneous Q Mon-1800   feeding supplement (NEPRO CARB STEADY)  237 mL Oral BID BM   fluticasone   furoate-vilanterol  1 puff Inhalation Daily   insulin  aspart  0-5 Units Subcutaneous QHS   insulin  aspart  0-9 Units Subcutaneous TID WC   levothyroxine   112 mcg Oral Q0600   metoprolol  tartrate  75 mg Oral BID   Continuous Infusions:  diltiazem  (CARDIZEM ) infusion Stopped (01/13/24 2132)   heparin  800 Units/hr (01/14/24 1007)   PRN Meds:.acetaminophen  **OR** acetaminophen , polyethylene glycol, promethazine    Anti-infectives (From admission, onward)    Start     Dose/Rate Route Frequency Ordered Stop   01/13/24 2200  cephALEXin  (KEFLEX ) capsule 250 mg        250 mg Oral Daily at bedtime 01/13/24 1947           Subjective: Lindsay Cortez today has no fevers, no  emesis,  No chest pain,   - Dyspnea and dizziness improved -Voiding okay - BP noted  Objective: Vitals:   01/14/24 0700 01/14/24 0800 01/14/24 0900 01/14/24 1000  BP: (!) 187/48 (!) 215/97 (!) 161/49 (!) 156/39  Pulse: 76 73 75 71  Resp: (!) 23 19 13 20   Temp:  97.9 F (36.6 C)    TempSrc:  Oral    SpO2: 94% 96% 95% 95%  Weight:      Height:        Intake/Output Summary (Last 24 hours) at 01/14/2024 1046 Last data filed at 01/14/2024 1007 Gross per 24 hour  Intake 341.53 ml  Output 300 ml  Net 41.53 ml   Filed Weights   01/13/24 1510  Weight: 49 kg    Physical Exam  Gen:- Awake Alert, no acute distress, speaking in sentences HEENT:- [place].AT, No sclera icterus Neck-Supple Neck,No JVD,. Rt internal jugular HD catheter Lungs-  CTAB , fair symmetrical air movement CV- S1, S2 normal, irregular, but Not irregularly regular Abd-  +ve B.Sounds, Abd Soft, No tenderness,    Extremity/Skin:- No  edema, pedal pulses present  Psych-affect is appropriate, oriented x3 Neuro-no new focal deficits, no tremors MSK-left arm AV fistula with thrill and bruit  Data Reviewed: I have personally reviewed following labs and imaging studies  CBC: Recent Labs  Lab 01/13/24 1522 01/14/24 0443  WBC 9.4 6.7  NEUTROABS 7.1  --   HGB 10.2* 9.2*  HCT 32.6* 29.4*  MCV 98.5 99.0  PLT 212 176   Basic Metabolic Panel: Recent Labs  Lab 01/13/24 1522 01/14/24 0443  NA 134* 138  K 3.5 3.7  CL 102 109  CO2 22 23  GLUCOSE 249* 107*  BUN 45* 51*  CREATININE 4.15* 4.32*  CALCIUM 8.9 8.3*  MG 2.2  --    GFR: Estimated Creatinine Clearance: 6.6 mL/min (A) (by C-G formula based on SCr of 4.32 mg/dL (H)). Liver Function Tests: Recent Labs  Lab 01/13/24 1522  AST 36  ALT 23  ALKPHOS 88  BILITOT 0.8  PROT 6.2*  ALBUMIN  3.4*    Recent Results (from the past 240 hours)  MRSA Next Gen by PCR, Nasal     Status: None   Collection Time: 01/13/24  7:09 PM   Specimen: Nasal Mucosa; Nasal  Swab  Result Value Ref Range Status   MRSA by PCR Next Gen NOT DETECTED NOT DETECTED Final    Comment: (NOTE) The GeneXpert MRSA Assay (FDA approved for NASAL specimens only), is one component of a comprehensive MRSA colonization surveillance program. It is not intended to diagnose MRSA infection nor to guide or monitor treatment for MRSA infections. Test performance is not FDA approved in patients less than  41 years old. Performed at [facility], [address] [place], [place] [address]     Radiology Studies: DG Chest Portable 1 View Result Date: 01/13/2024 CLINICAL DATA:  Dizziness. EXAM: PORTABLE CHEST 1 VIEW COMPARISON:  June 11, 2023. FINDINGS: Mild cardiomegaly is noted. Right internal jugular dialysis catheter is unchanged. Lungs are clear. Bony thorax is unremarkable. IMPRESSION: No active disease. Electronically Signed   By: Rosalene Colon M.D.   On: 01/13/2024 15:38   Scheduled Meds:  allopurinol   200 mg Oral q AM   cephALEXin   250 mg Oral QHS   Chlorhexidine  Gluconate Cloth  6 each Topical Q0600   Chlorhexidine  Gluconate Cloth  6 each Topical Q0600   darbepoetin (ARANESP ) injection - DIALYSIS  40 mcg Subcutaneous Q Mon-1800   feeding supplement (NEPRO CARB STEADY)  237 mL Oral BID BM   fluticasone  furoate-vilanterol  1 puff Inhalation Daily   insulin  aspart  0-5 Units Subcutaneous QHS   insulin  aspart  0-9 Units Subcutaneous TID WC   levothyroxine   112 mcg Oral Q0600   metoprolol  tartrate  75 mg Oral BID   Continuous Infusions:  diltiazem  (CARDIZEM ) infusion Stopped (01/13/24 2132)   heparin  800 Units/hr (01/14/24 1007)    LOS: 1 day   Colin Dawley M.D on 01/14/2024 at 10:46 AM  Go to [email or web address] - for contact info  [facility] - Office  [phone]  If 7PM-7AM, please contact night-coverage [email or web address] 01/14/2024, 10:46 AM

## 2024-01-14 NOTE — Consult Note (Signed)
 [facility] Renal Consultation Note    Indication for Consultation:  Management of ESRD/hemodialysis; anemia, hypertension/volume and secondary hyperparathyroidism  HPI: Lindsay Cortez is a 83 y.o. female with a PMH significant for DM, HTN, nephrolithiasis, COPD, and ESRD (on HD MWF at [facility]) who presented to [facility] ED via EMS after dizziness and near syncope.  In the ED, her heart rate was noted to be in the 130's-140's and ECG revealed atrial fibrillation with RVR.  Temp 97.6, HR 149, Bp 112/93, SpO2 95%.  Labs notable for Hgb 10.2, Na 134, gluc 249, alb 3.4.  She was admitted to the ICU and we were consulted to provide dialysis during her hospitalization.    She converted to NSR and is feeling much better today.  She started dialysis about 9 months ago and has a LUE AVF, however it has not been successfully used due to recurrent infiltrations.  She has a RIJ TDC that she uses for HD.   Past Medical History:  Diagnosis Date   Arthritis    Asthma    Back pain    COPD (chronic obstructive pulmonary disease) (HCC)    Diabetes (HCC) 09/01/2014   Diabetes mellitus    x 15 yrs   Gout    Hypertension    Kidney stones    Pancreatic cyst    Pneumonia    Renal disorder    Renal insufficiency    Shoulder dislocation    Vertigo    Past Surgical History:  Procedure Laterality Date   ABDOMINAL HYSTERECTOMY     AV FISTULA PLACEMENT Left 11/21/2022   Procedure: LEFT ARM ARTERIOVENOUS (AV) FISTULA CREATION;  Surgeon: Mayo Speck, MD;  Location: [facility];  Service: Vascular;  Laterality: Left;   BLADDER SURGERY     bladder tac   BREAST SURGERY Left    benign   FOOT SURGERY Bilateral    bunions   Family History:   Family History  Problem Relation Age of Onset   Breast cancer Mother    Emphysema Father    COPD Sister    Atrial fibrillation Sister    Obesity Brother    Cirrhosis Daughter    COPD Son    Diabetes Brother    Asthma Brother    Social History:   reports that she has never smoked. She has never used smokeless tobacco. She reports that she does not drink alcohol and does not use drugs. Allergies  Allergen Reactions   Codeine Nausea And Vomiting    Narcotic pain medicine makes her nauseated.  Says was told to only take tylenol  due to kidney disease.   Fentanyl  Nausea And Vomiting   Levaquin  [Levofloxacin  In D5w] Nausea And Vomiting   Levofloxacin  Nausea And Vomiting   Prior to Admission medications   Medication Sig Start Date End Date Taking? Authorizing Provider  allopurinol  (ZYLOPRIM ) 100 MG tablet Take 200 mg by mouth in the morning.   Yes [provider]  BREO ELLIPTA  100-25 MCG/ACT AEPB Inhale 1 puff into the lungs daily. 05/29/23  Yes [provider]  Calcium Carb-Cholecalciferol  (CALCIUM 600 + D PO) Take 1 tablet by mouth every evening.   Yes [provider]  cephALEXin  (KEFLEX ) 250 MG capsule Take 250 mg by mouth at bedtime. 12/29/23  Yes [provider]  Cholecalciferol  (VITAMIN D ) 2000 UNITS CAPS Take 2,000 Units by mouth every evening.   Yes [provider]  furosemide  (LASIX ) 20 MG tablet Take 20 mg by mouth daily  as needed for fluid or edema. 03/25/21  Yes [provider]  levothyroxine  (SYNTHROID ) 112 MCG tablet Take 112 mcg by mouth daily before breakfast. 06/27/23  Yes [provider]  loperamide  (IMODIUM ) 2 MG capsule Take 2 mg by mouth in the morning.   Yes [provider]  metoprolol  succinate (TOPROL -XL) 50 MG 24 hr tablet Take 50 mg by mouth in the morning. 03/10/19  Yes [provider]  simvastatin  (ZOCOR ) 10 MG tablet Take 10 mg by mouth at bedtime.   Yes [provider]  TRADJENTA 5 MG TABS tablet Take 5 mg by mouth daily. 03/05/23  Yes [provider]  zinc sulfate 220 (50 Zn) MG capsule Take 220 mg by mouth in the morning.   Yes [provider]   Current Facility-Administered Medications  Medication Dose Route  Frequency Provider Last Rate Last Admin   acetaminophen  (TYLENOL ) tablet 650 mg  650 mg Oral Q6H PRN Emokpae, Ejiroghene E, MD       Or   acetaminophen  (TYLENOL ) suppository 650 mg  650 mg Rectal Q6H PRN Emokpae, Ejiroghene E, MD       allopurinol  (ZYLOPRIM ) tablet 200 mg  200 mg Oral q AM Emokpae, Ejiroghene E, MD   200 mg at 01/14/24 0558   cephALEXin  (KEFLEX ) capsule 250 mg  250 mg Oral QHS Emokpae, Ejiroghene E, MD   250 mg at 01/13/24 2155   Chlorhexidine  Gluconate Cloth 2 % PADS 6 each  6 each Topical Q0600 Emokpae, Ejiroghene E, MD   6 each at 01/14/24 0600   diltiazem  (CARDIZEM ) 125 mg in dextrose  5% 125 mL (1 mg/mL) infusion  5-15 mg/hr Intravenous Continuous Emokpae, Ejiroghene E, MD   Stopped at 01/13/24 2132   feeding supplement (NEPRO CARB STEADY) liquid 237 mL  237 mL Oral BID BM Adefeso, Oladapo, DO       fluticasone  furoate-vilanterol (BREO ELLIPTA ) 100-25 MCG/ACT 1 puff  1 puff Inhalation Daily Emokpae, Ejiroghene E, MD       heparin  ADULT infusion 100 units/mL (25000 units/250mL)  800 Units/hr Intravenous Continuous Delorse Fey, RPH 8 mL/hr at 01/14/24 0751 800 Units/hr at 01/14/24 0751   insulin  aspart (novoLOG ) injection 0-5 Units  0-5 Units Subcutaneous QHS Emokpae, Ejiroghene E, MD       insulin  aspart (novoLOG ) injection 0-9 Units  0-9 Units Subcutaneous TID WC Emokpae, Ejiroghene E, MD       levothyroxine  (SYNTHROID ) tablet 112 mcg  112 mcg Oral Q0600 Emokpae, Ejiroghene E, MD   112 mcg at 01/14/24 0559   metoprolol  tartrate (LOPRESSOR ) tablet 75 mg  75 mg Oral BID Emokpae, Ejiroghene E, MD   75 mg at 01/14/24 0335   polyethylene glycol (MIRALAX  / GLYCOLAX ) packet 17 g  17 g Oral Daily PRN Emokpae, Ejiroghene E, MD       promethazine  (PHENERGAN ) tablet 12.5 mg  12.5 mg Oral Q6H PRN Emokpae, Ejiroghene E, MD       Labs: Basic Metabolic Panel: Recent Labs  Lab 01/13/24 1522 01/14/24 0443  NA 134* 138  K 3.5 3.7  CL 102 109  CO2 22 23  GLUCOSE 249* 107*  BUN 45*  51*  CREATININE 4.15* 4.32*  CALCIUM 8.9 8.3*   Liver Function Tests: Recent Labs  Lab 01/13/24 1522  AST 36  ALT 23  ALKPHOS 88  BILITOT 0.8  PROT 6.2*  ALBUMIN  3.4*   No results for input(s): "LIPASE", "AMYLASE" in the last 168 hours. No results for input(s): "AMMONIA"  in the last 168 hours. CBC: Recent Labs  Lab 01/13/24 1522 01/14/24 0443  WBC 9.4 6.7  NEUTROABS 7.1  --   HGB 10.2* 9.2*  HCT 32.6* 29.4*  MCV 98.5 99.0  PLT 212 176   Cardiac Enzymes: No results for input(s): "CKTOTAL", "CKMB", "CKMBINDEX", "TROPONINI" in the last 168 hours. CBG: Recent Labs  Lab 01/13/24 2143 01/14/24 0738  GLUCAP 116* 109*   Iron Studies: No results for input(s): "IRON", "TIBC", "TRANSFERRIN", "FERRITIN" in the last 72 hours. Studies/Results: DG Chest Portable 1 View Result Date: 01/13/2024 CLINICAL DATA:  Dizziness. EXAM: PORTABLE CHEST 1 VIEW COMPARISON:  June 11, 2023. FINDINGS: Mild cardiomegaly is noted. Right internal jugular dialysis catheter is unchanged. Lungs are clear. Bony thorax is unremarkable. IMPRESSION: No active disease. Electronically Signed   By: Rosalene Colon M.D.   On: 01/13/2024 15:38    ROS: Pertinent items are noted in HPI. Physical Exam: Vitals:   01/14/24 0447 01/14/24 0557 01/14/24 0700 01/14/24 0800  BP: (!) 175/51 (!) 156/50 (!) 187/48 (!) 215/97  Pulse: 67 73 76 73  Resp: (!) 23 18 (!) 23 19  Temp:    97.9 F (36.6 C)  TempSrc:    Oral  SpO2: 95% 93% 94% 96%  Weight:      Height:          Weight change:   Intake/Output Summary (Last 24 hours) at 01/14/2024 1610 Last data filed at 01/14/2024 0751 Gross per 24 hour  Intake 323.4 ml  Output 300 ml  Net 23.4 ml   BP (!) 215/97   Pulse 73   Temp 97.9 F (36.6 C) (Oral)   Resp 19   Ht 4\' 11"  (1.499 m)   Wt 49 kg   SpO2 96%   BMI 21.82 kg/m  General appearance: alert, cooperative, and no distress Resp: clear to auscultation bilaterally Cardio: regular rate and rhythm, S1,  S2 normal, no murmur, click, rub or gallop GI: soft, non-tender; bowel sounds normal; no masses,  no organomegaly Extremities: extremities normal, atraumatic, no cyanosis or edema and LUE AVF +T/B Dialysis Access:  Dialysis Orders: Center: [facility]  on MWF . EDW 47kg HD Bath 3K/2.5Ca  Time 3:15 Heparin  1000 units bolus then 500 units/hr. Access RIJ TDC BFR 350 DFR 600      Assessment/Plan:  Atrial fib with RVR - she has converted to NSR after metoprolol  and IV diltiazem .  Feels much better today.   ESRD -   plan for HD today to keep on her outpatient schedule  Hypertension/volume  -  bp was low with RVR but elevated this am.  UF with HD and follow.  Anemia  - was not on ESA prior to admission.  Hgb down to 9.2, will start ESA and follow  Metabolic bone disease -   continue with home meds  Nutrition -  renal diet, carb modified.  DM type 2 - per primary   Benjamin Brands, MD [facility], [facility] 01/14/2024, 8:33 AM

## 2024-01-14 NOTE — Progress Notes (Signed)
   HEMODIALYSIS TREATMENT NOTE:  Uneventful 3.25 hour heparin -free treatment completed using RIJ TDC. Net UF 1.8 liters.  All blood was returned.  No changes from pre-HD assessment. At EDW.  Post-HD:  01/14/24 2100  Vitals  Temp 97.9 F (36.6 C)  Temp Source Oral  BP (!) 141/57  MAP (mmHg) 76  Pulse Rate 61  ECG Heart Rate 62  Resp 18  Hepatitis B Pre Treatment Patient Checks  Hepatitis B Surface Antigen Results Pending (labs drawn, awaiting results)  Date Hepatitis B Surface Antigen Drawn 01/11/24  Hep B Antibody Quant/Post still pending  Date Hep B Antibody Quant/Post Drawn 01/14/24  Patient's Immunity Status Pending (labs drawn, awaiting results)  Isolation Initiated Unknown Hepatitis status (T2 for chemical disinfection postHD)  Post Treatment  Dialyzer Clearance Lightly streaked  Hemodialysis Intake (mL) 0 mL  Liters Processed 72.9  Fluid Removed (mL) 1800 mL  Tolerated HD Treatment Yes  Post-Hemodialysis Comments Goal nearly met.  UF was limited by declining BP.    Canesha Tesfaye, RN [facility]

## 2024-01-14 NOTE — Progress Notes (Signed)
*  PRELIMINARY RESULTS* Echocardiogram 2D Echocardiogram has been performed.  Bernis Brisker 01/14/2024, 1:11 PM

## 2024-01-14 NOTE — Progress Notes (Signed)
 PHARMACY - ANTICOAGULATION CONSULT NOTE  Pharmacy Consult for Heparin  Infusion  Indication: atrial fibrillation  Allergies  Allergen Reactions   Codeine Nausea And Vomiting    Narcotic pain medicine makes her nauseated.  Says was told to only take tylenol  due to kidney disease.   Fentanyl  Nausea And Vomiting   Levaquin  [Levofloxacin  In D5w] Nausea And Vomiting   Levofloxacin  Nausea And Vomiting    Patient Measurements: Height: 4\' 11"  (149.9 cm) Weight: 49 kg (108 lb 0.4 oz) IBW/kg (Calculated) : 43.2 HEPARIN  DW (KG): 49  Vital Signs: Temp: 97.9 F (36.6 C) (05/19 0800) Temp Source: Oral (05/19 0800) BP: 151/50 (05/19 1400) Pulse Rate: 69 (05/19 1400)  Labs: Recent Labs    01/13/24 1522 01/13/24 1655 01/14/24 0443 01/14/24 1334  HGB 10.2*  --  9.2*  --   HCT 32.6*  --  29.4*  --   PLT 212  --  176  --   APTT 31  --   --   --   LABPROT 14.1  --   --   --   INR 1.1  --   --   --   HEPARINUNFRC  --   --  0.21* 0.28*  CREATININE 4.15*  --  4.32*  --   TROPONINIHS 9 12  --   --     Estimated Creatinine Clearance: 6.6 mL/min (A) (by C-G formula based on SCr of 4.32 mg/dL (H)).   Medical History: Past Medical History:  Diagnosis Date   Arthritis    Asthma    Back pain    COPD (chronic obstructive pulmonary disease) (HCC)    Diabetes (HCC) 09/01/2014   Diabetes mellitus    x 15 yrs   Gout    Hypertension    Kidney stones    Pancreatic cyst    Pneumonia    Renal disorder    Renal insufficiency    Shoulder dislocation    Vertigo      Assessment: 83 yo female admitted with near syncope and new A. Fib with RVR. Patient has PMH of ESRD, COPD, HTN, and diabetes. Pharmacy has been consulted for heparin  dosing and monitoring.   HL 0.28- slightly subtherapeutic  Goal of Therapy:  Heparin  level 0.3-0.7 units/ml Monitor platelets by anticoagulation protocol: Yes   Plan:  Increase heparin  to 900 units/hr Check anti-Xa level in 8 hours and daily while on  heparin  Continue to monitor H&H and platelets  Cliffton Dama, PharmD Clinical Pharmacist 01/14/2024 2:34 PM

## 2024-01-14 NOTE — Progress Notes (Signed)
 PHARMACY - ANTICOAGULATION CONSULT NOTE  Pharmacy Consult for Heparin  Infusion  Indication: atrial fibrillation  Allergies  Allergen Reactions   Codeine Nausea And Vomiting    Narcotic pain medicine makes her nauseated.  Says was told to only take tylenol  due to kidney disease.   Fentanyl  Nausea And Vomiting   Levaquin  [Levofloxacin  In D5w] Nausea And Vomiting   Levofloxacin  Nausea And Vomiting    Patient Measurements: Height: 4\' 11"  (149.9 cm) Weight: 49 kg (108 lb 0.4 oz) IBW/kg (Calculated) : 43.2 HEPARIN  DW (KG): 49  Vital Signs: BP: 184/53 (05/19 0335) Pulse Rate: 81 (05/19 0335)  Labs: Recent Labs    01/13/24 1522 01/13/24 1655 01/14/24 0443  HGB 10.2*  --  9.2*  HCT 32.6*  --  29.4*  PLT 212  --  176  APTT 31  --   --   LABPROT 14.1  --   --   INR 1.1  --   --   HEPARINUNFRC  --   --  0.21*  CREATININE 4.15*  --  4.32*  TROPONINIHS 9 12  --     Estimated Creatinine Clearance: 6.6 mL/min (A) (by C-G formula based on SCr of 4.32 mg/dL (H)).   Medical History: Past Medical History:  Diagnosis Date   Arthritis    Asthma    Back pain    COPD (chronic obstructive pulmonary disease) (HCC)    Diabetes (HCC) 09/01/2014   Diabetes mellitus    x 15 yrs   Gout    Hypertension    Kidney stones    Pancreatic cyst    Pneumonia    Renal disorder    Renal insufficiency    Shoulder dislocation    Vertigo      Assessment: 83 yo female admitted with near syncope and new A. Fib with RVR. Patient has PMH of ESRD, COPD, HTN, and diabetes. Pharmacy has been consulted for heparin  dosing and monitoring.   5/19 AM update:  Heparin  level sub-therapeutic   Goal of Therapy:  Heparin  level 0.3-0.7 units/ml Monitor platelets by anticoagulation protocol: Yes   Plan:  Inc heparin  to 800 units/hr Check anti-Xa level in 8 hours and daily while on heparin  Continue to monitor H&H and platelets  Silvestre Drum, PharmD, BCPS Clinical Pharmacist Phone: [phone]

## 2024-01-14 NOTE — TOC Initial Note (Signed)
 Transition of Care [facility]) - Initial/Assessment Note    Patient Details  Name: Lindsay Cortez MRN: [id number] Date of Birth: 26-Jun-1941  [facility] CM/SW Contact:    Ander Katos, LCSW Phone Number: 01/14/2024, 9:23 AM  Clinical Narrative: Pt admitted for atrial fibrillation with RVR. Assessment completed due to high risk readmission score. Pt reports she lives alone and is independent with ADLs at baseline. Pt ambulates with a cane. She is active with [facility]. Will need resumptions orders. Pt drives herself to appointments. Her dialysis is MWF at [facility]. Plan is for return home. TOC will continue to follow.                   Expected Discharge Plan: Home w Home Health Services Barriers to Discharge: Continued Medical Work up   Patient Goals and CMS Choice Patient states their goals for this hospitalization and ongoing recovery are:: return home   Choice offered to / list presented to : Patient [place] ownership interest in [facility].provided to::  (n/a)    Expected Discharge Plan and Services In-house Referral: Clinical Social Work   Post Acute Care Choice: Resumption of Svcs/PTA Provider Living arrangements for the past 2 months: Apartment                           HH Arranged: PT HH Agency: [facility] Date HH Agency Contacted: 01/14/24 Time HH Agency Contacted: [phone] Representative spoke with at [facility] Agency: [facility]  Prior Living Arrangements/Services Living arrangements for the past 2 months: Apartment Lives with:: Self Patient language and need for interpreter reviewed:: Yes Do you feel safe going back to the place where you live?: Yes      Need for Family Participation in Patient Care: No (Comment)   Current home services: DME, Home PT (walker) Criminal Activity/Legal Involvement Pertinent to Current Situation/Hospitalization: No - Comment as needed  Activities of Daily Living   ADL Screening  (condition at time of admission) Independently performs ADLs?: Yes (appropriate for developmental age) Is the patient deaf or have difficulty hearing?: No Does the patient have difficulty seeing, even when wearing glasses/contacts?: No Does the patient have difficulty concentrating, remembering, or making decisions?: No  Permission Sought/Granted   Permission granted to share information with : Yes, Verbal Permission Granted     Permission granted to share info w AGENCY: [facility]  Permission granted to share info w Relationship: home health     Emotional Assessment     Affect (typically observed): Appropriate Orientation: : Oriented to Self, Oriented to Place, Oriented to  Time, Oriented to Situation Alcohol / Substance Use: Not Applicable Psych Involvement: No (comment)  Admission diagnosis:  Atrial fibrillation with RVR (HCC) [I48.91] Patient Active Problem List   Diagnosis Date Noted   Atrial fibrillation with RVR (HCC) 01/13/2024   ESRD on dialysis (HCC) 01/13/2024   Hypotension 06/12/2023   Hypokalemia 06/12/2023   Prolonged QT interval 06/12/2023   Near syncope 06/11/2023   Acute kidney failure with lesion of tubular necrosis (HCC) 11/14/2022   Chronic kidney disease due to diabetes mellitus (HCC) 11/14/2022   Anemia of chronic disease 11/14/2022   Benign hypertensive kidney disease with chronic kidney disease 11/14/2022   CAP (community acquired pneumonia) 02/24/2021   Sepsis due to community-acquired pneumonia 02/24/2021   Hypoalbuminemia 02/24/2021   Hypothyroidism 02/24/2021   Hyperglycemia due to diabetes mellitus (HCC) 02/24/2021   Essential hypertension 02/24/2021  Leukocytosis 07/11/2019   Acute respiratory failure with hypoxia (HCC) 09/02/2014   Chronic kidney disease, stage 3 unspecified (HCC) 09/02/2014   Nausea and vomiting 09/01/2014   COPD (chronic obstructive pulmonary disease) (HCC) 09/01/2014   Dehydration 09/01/2014   Hypoxia 09/01/2014    Diabetes mellitus (HCC) 09/01/2014   Acute exacerbation of chronic obstructive airways disease with asthma (HCC) 09/01/2014   Pancreatic mass 05/19/2014   Common bile duct calculus 05/19/2014   Right knee pain 09/18/2013   Lateral meniscus derangement 09/18/2013   Arthritis of right knee 09/18/2013   PCP:  Kathyleen Parkins, MD Pharmacy:   [facility] [address], [place] - [address] [place] [address] [place] [address] Phone: [phone] Fax: [phone]     Social Drivers of Health (SDOH) Social History: SDOH Screenings   Food Insecurity: No Food Insecurity (01/13/2024)  Housing: Low Risk  (01/13/2024)  Transportation Needs: No Transportation Needs (01/13/2024)  Utilities: Not At Risk (01/13/2024)  Tobacco Use: Low Risk  (01/13/2024)   SDOH Interventions:     Readmission Risk Interventions    01/14/2024    9:21 AM  Readmission Risk Prevention Plan  Transportation Screening Complete  Home Care Screening Complete  Medication Review (RN CM) Complete

## 2024-01-15 ENCOUNTER — Other Ambulatory Visit (HOSPITAL_COMMUNITY): Payer: Self-pay

## 2024-01-15 ENCOUNTER — Telehealth (HOSPITAL_COMMUNITY): Payer: Self-pay | Admitting: Pharmacy Technician

## 2024-01-15 DIAGNOSIS — I4891 Unspecified atrial fibrillation: Secondary | ICD-10-CM | POA: Diagnosis not present

## 2024-01-15 LAB — RENAL FUNCTION PANEL
Albumin: 3.2 g/dL — ABNORMAL LOW (ref 3.5–5.0)
Anion gap: 7 (ref 5–15)
BUN: 20 mg/dL (ref 8–23)
CO2: 28 mmol/L (ref 22–32)
Calcium: 8.2 mg/dL — ABNORMAL LOW (ref 8.9–10.3)
Chloride: 98 mmol/L (ref 98–111)
Creatinine, Ser: 2.17 mg/dL — ABNORMAL HIGH (ref 0.44–1.00)
GFR, Estimated: 22 mL/min — ABNORMAL LOW (ref >60)
Glucose, Bld: 91 mg/dL (ref 70–99)
Phosphorus: 2.8 mg/dL (ref 2.5–4.6)
Potassium: 3.4 mmol/L — ABNORMAL LOW (ref 3.5–5.1)
Sodium: 133 mmol/L — ABNORMAL LOW (ref 135–145)

## 2024-01-15 LAB — CBC
HCT: 30.3 % — ABNORMAL LOW (ref 36.0–46.0)
Hemoglobin: 9.6 g/dL — ABNORMAL LOW (ref 12.0–15.0)
MCH: 31 pg (ref 26.0–34.0)
MCHC: 31.7 g/dL (ref 30.0–36.0)
MCV: 97.7 fL (ref 80.0–100.0)
Platelets: 195 10*3/uL (ref 150–400)
RBC: 3.1 MIL/uL — ABNORMAL LOW (ref 3.87–5.11)
RDW: 14.2 % (ref 11.5–15.5)
WBC: 9.9 10*3/uL (ref 4.0–10.5)
nRBC: 0 % (ref 0.0–0.2)

## 2024-01-15 LAB — MISC LABCORP TEST (SEND OUT): Labcorp test code: 6510

## 2024-01-15 LAB — HEPARIN LEVEL (UNFRACTIONATED): Heparin Unfractionated: 0.31 [IU]/mL (ref 0.30–0.70)

## 2024-01-15 LAB — HEPATITIS B SURFACE ANTIGEN: Hepatitis B Surface Ag: NONREACTIVE

## 2024-01-15 LAB — GLUCOSE, CAPILLARY
Glucose-Capillary: 109 mg/dL — ABNORMAL HIGH (ref 70–99)
Glucose-Capillary: 88 mg/dL (ref 70–99)

## 2024-01-15 LAB — HEPATITIS B SURFACE ANTIBODY, QUANTITATIVE: Hep B S AB Quant (Post): <3.5 m[IU]/mL — ABNORMAL LOW

## 2024-01-15 LAB — MAGNESIUM: Magnesium: 1.9 mg/dL (ref 1.7–2.4)

## 2024-01-15 MED ORDER — POTASSIUM CHLORIDE CRYS ER 20 MEQ PO TBCR
40.0000 meq | EXTENDED_RELEASE_TABLET | Freq: Once | ORAL | Status: AC
  Administered 2024-01-15: 40 meq via ORAL
  Filled 2024-01-15: qty 2

## 2024-01-15 MED ORDER — APIXABAN 2.5 MG PO TABS: 2.5000 mg | ORAL_TABLET | Freq: Two times a day (BID) | ORAL | 5 refills | Status: DC

## 2024-01-15 MED ORDER — APIXABAN 5 MG PO TABS: 5.0000 mg | ORAL_TABLET | Freq: Two times a day (BID) | ORAL | 5 refills | Status: DC

## 2024-01-15 MED ORDER — LEVOTHYROXINE SODIUM 88 MCG PO TABS: 88.0000 ug | ORAL_TABLET | Freq: Every day | ORAL | 3 refills | Status: DC

## 2024-01-15 NOTE — Discharge Instructions (Addendum)
 1)Very Low-salt diet advised---Less than 2 gm of Sodium per day advised----ok to use Mrs DASH salt substitute instead of Salt 2)Weigh yourself daily, call if you gain more than 3 pounds in 1 day or more than 5 pounds in 1 week as your hemodialysis schedule may need to be adjusted 3)Limit your Fluid  intake to no more than 50 ounces (1.5 Liters) per day 4)Watch for bleeding while on Blood Thinners--watch for blood in your stool which can make your stool black, maroon, mahogany or red---, blood in your urine which can make your urine pink or red, nosebleeds , also watch for possible bruising -You are taking Apixaban/Eliquis--- which is a blood thinner--- be careful to avoid injury or falls 5)Avoid ibuprofen/Advil/Aleve/Motrin/Goody Powders/Naproxen/BC powders/Meloxicam/Diclofenac/Indomethacin and other Nonsteroidal anti-inflammatory medications as these will make you more likely to bleed and can cause stomach ulcers, can also cause Kidney problems.   Information on my medicine - ELIQUIS (apixaban)  This medication education was reviewed with me or my healthcare representative as part of my discharge preparation.    Why was Eliquis prescribed for you? Eliquis was prescribed for you to reduce the risk of a blood clot forming that can cause a stroke if you have a medical condition called atrial fibrillation (a type of irregular heartbeat).  What do You need to know about Eliquis ? Take your Eliquis TWICE DAILY - one tablet in the morning and one tablet in the evening with or without food. If you have difficulty swallowing the tablet whole please discuss with your pharmacist how to take the medication safely.  Take Eliquis exactly as prescribed by your doctor and DO NOT stop taking Eliquis without talking to the doctor who prescribed the medication.  Stopping may increase your risk of developing a stroke.  Refill your prescription before you run out.  After discharge, you should have regular  check-up appointments with your healthcare provider that is prescribing your Eliquis.  In the future your dose may need to be changed if your kidney function or weight changes by a significant amount or as you get older.  What do you do if you miss a dose? If you miss a dose, take it as soon as you remember on the same day and resume taking twice daily.  Do not take more than one dose of ELIQUIS at the same time to make up a missed dose.  Important Safety Information A possible side effect of Eliquis is bleeding. You should call your healthcare provider right away if you experience any of the following: Bleeding from an injury or your nose that does not stop. Unusual colored urine (red or dark brown) or unusual colored stools (red or black). Unusual bruising for unknown reasons. A serious fall or if you hit your head (even if there is no bleeding).  Some medicines may interact with Eliquis and might increase your risk of bleeding or clotting while on Eliquis. To help avoid this, consult your healthcare provider or pharmacist prior to using any new prescription or non-prescription medications, including herbals, vitamins, non-steroidal anti-inflammatory drugs (NSAIDs) and supplements.  This website has more information on Eliquis (apixaban): http://www.eliquis.com/eliquis/home

## 2024-01-15 NOTE — Care Management Important Message (Signed)
 Important Message  Patient Details  Name: KODIE PICK MRN: [id number] Date of Birth: 1940-09-28   Important Message Given:  N/A - LOS <3 / Initial given by admissions     Neila Bally 01/15/2024, 11:53 AM

## 2024-01-15 NOTE — TOC Transition Note (Signed)
 Transition of Care [facility]) - Discharge Note   Patient Details  Name: Lindsay Cortez MRN: [id number] Date of Birth: 21-Nov-1940  Transition of Care [facility]) CM/SW Contact:  Ander Katos, LCSW Phone Number: 01/15/2024, 8:41 AM   Clinical Narrative:  Pt d/c today. Christian with [facility] notified. HHPT order in.      Final next level of care: Home w Home Health Services Barriers to Discharge: Barriers Resolved   Patient Goals and CMS Choice Patient states their goals for this hospitalization and ongoing recovery are:: return home   Choice offered to / list presented to : Patient [place] ownership interest in [facility].provided to::  (n/a)    Discharge Placement                       Discharge Plan and Services Additional resources added to the After Visit Summary for   In-house Referral: Clinical Social Work   Post Acute Care Choice: Resumption of Svcs/PTA Provider                    HH Arranged: PT [facility] Agency: [facility] Date [facility] Agency Contacted: 01/14/24 Time HH Agency Contacted: [phone] Representative spoke with at [facility] Agency: Lynder Sanger  Social Drivers of Health (SDOH) Interventions SDOH Screenings   Food Insecurity: No Food Insecurity (01/13/2024)  Housing: Low Risk  (01/13/2024)  Transportation Needs: No Transportation Needs (01/13/2024)  Utilities: Not At Risk (01/13/2024)  Social Connections: Moderately Isolated (01/14/2024)  Tobacco Use: Low Risk  (01/13/2024)     Readmission Risk Interventions    01/14/2024    9:21 AM  Readmission Risk Prevention Plan  Transportation Screening Complete  Home Care Screening Complete  Medication Review (RN CM) Complete

## 2024-01-15 NOTE — Progress Notes (Signed)
 PHARMACY - ANTICOAGULATION CONSULT NOTE  Pharmacy Consult for Heparin  Infusion  Indication: atrial fibrillation  Allergies  Allergen Reactions   Codeine Nausea And Vomiting    Narcotic pain medicine makes her nauseated.  Says was told to only take tylenol  due to kidney disease.   Fentanyl  Nausea And Vomiting   Levaquin  [Levofloxacin  In D5w] Nausea And Vomiting   Levofloxacin  Nausea And Vomiting    Patient Measurements: Height: 4\' 11"  (149.9 cm) Weight: 49.1 kg (108 lb 3.9 oz) IBW/kg (Calculated) : 43.2 HEPARIN  DW (KG): 49  Vital Signs: Temp: 97.5 F (36.4 C) (05/20 0444) Temp Source: Oral (05/20 0444) BP: 147/45 (05/20 0330) Pulse Rate: 57 (05/20 0330)  Labs: Recent Labs    01/13/24 1522 01/13/24 1522 01/13/24 1655 01/14/24 0443 01/14/24 1334 01/14/24 2150 01/15/24 0438  HGB 10.2*  --   --  9.2*  --   --  9.6*  HCT 32.6*  --   --  29.4*  --   --  30.3*  PLT 212  --   --  176  --   --  195  APTT 31  --   --   --   --   --   --   LABPROT 14.1  --   --   --   --   --   --   INR 1.1  --   --   --   --   --   --   HEPARINUNFRC  --    < >  --  0.21* 0.28* 0.84* 0.31  CREATININE 4.15*  --   --  4.32*  --   --   --   TROPONINIHS 9  --  12  --   --   --   --    < > = values in this interval not displayed.    Estimated Creatinine Clearance: 6.6 mL/min (A) (by C-G formula based on SCr of 4.32 mg/dL (H)).   Medical History: Past Medical History:  Diagnosis Date   Arthritis    Asthma    Back pain    COPD (chronic obstructive pulmonary disease) (HCC)    Diabetes (HCC) 09/01/2014   Diabetes mellitus    x 15 yrs   Gout    Hypertension    Kidney stones    Pancreatic cyst    Pneumonia    Renal disorder    Renal insufficiency    Shoulder dislocation    Vertigo      Assessment: 83 yo female admitted with near syncope and new A. Fib with RVR. Patient has PMH of ESRD, COPD, HTN, and diabetes. Pharmacy has been consulted for heparin  dosing and monitoring.   5/20  AM update:  Heparin  level therapeutic at 0.31  Goal of Therapy:  Heparin  level 0.3-0.7 units/ml Monitor platelets by anticoagulation protocol: Yes   Plan:  Cont heparin  900 units Check anti-Xa level in 8 hours and daily while on heparin  Continue to monitor H&H and platelets  Silvestre Drum, PharmD, BCPS Clinical Pharmacist Phone: [phone]

## 2024-01-15 NOTE — Telephone Encounter (Signed)
 Patient Product/process development scientist completed.    The patient is insured through [employer]/ [employer]. Patient has Medicare and is not eligible for a copay card, but may be able to apply for patient assistance or Medicare RX Payment Plan (Patient Must reach out to their plan, if eligible for payment plan), if available.    Ran test claim for Eliquis 5 mg and the current 30 day co-pay is $12.15.   This test claim was processed through [facility]- copay amounts may vary at other pharmacies due to pharmacy/plan contracts, or as the patient moves through the different stages of their insurance plan.     Lindsay Cortez, CPHT Pharmacy Technician III Certified Patient Advocate [facility] Patient Advocate Team Direct Number: [phone]  Fax: [phone]

## 2024-01-15 NOTE — Progress Notes (Signed)
 Patient ID: Lindsay Cortez, female   DOB: Dec 28, 1940, 83 y.o.   MRN: [id number] S: Feels better this morning and tolerated HD yesterday without issues or recurrent tachycardia. O:BP (!) 155/39   Pulse 63   Temp (!) 97.4 F (36.3 C) (Oral)   Resp 17   Ht 4\' 11"  (1.499 m)   Wt 49.1 kg   SpO2 96%   BMI 21.86 kg/m   Intake/Output Summary (Last 24 hours) at 01/15/2024 1046 Last data filed at 01/15/2024 0947 Gross per 24 hour  Intake 287.66 ml  Output 1800 ml  Net -1512.34 ml   Intake/Output: I/O last 3 completed shifts: In: 405.6 [P.O.:180; I.V.:225.6] Out: 2100 [Urine:300; Other:1800]  Intake/Output this shift:  Total I/O In: 216.5 [I.V.:216.5] Out: -  Weight change: 0.1 kg Gen: NAD CVS: RRR Resp: CTA Abd: +BS, soft, NT/ND Ext: no edema, LUE AVF +T/B  Recent Labs  Lab 01/13/24 1522 01/14/24 0443 01/15/24 0438  NA 134* 138 133*  K 3.5 3.7 3.4*  CL 102 109 98  CO2 22 23 28   GLUCOSE 249* 107* 91  BUN 45* 51* 20  CREATININE 4.15* 4.32* 2.17*  ALBUMIN  3.4*  --  3.2*  CALCIUM 8.9 8.3* 8.2*  PHOS  --   --  2.8  AST 36  --   --   ALT 23  --   --    Liver Function Tests: Recent Labs  Lab 01/13/24 1522 01/15/24 0438  AST 36  --   ALT 23  --   ALKPHOS 88  --   BILITOT 0.8  --   PROT 6.2*  --   ALBUMIN  3.4* 3.2*   No results for input(s): "LIPASE", "AMYLASE" in the last 168 hours. No results for input(s): "AMMONIA" in the last 168 hours. CBC: Recent Labs  Lab 01/13/24 1522 01/14/24 0443 01/15/24 0438  WBC 9.4 6.7 9.9  NEUTROABS 7.1  --   --   HGB 10.2* 9.2* 9.6*  HCT 32.6* 29.4* 30.3*  MCV 98.5 99.0 97.7  PLT 212 176 195   Cardiac Enzymes: No results for input(s): "CKTOTAL", "CKMB", "CKMBINDEX", "TROPONINI" in the last 168 hours. CBG: Recent Labs  Lab 01/14/24 0738 01/14/24 1133 01/14/24 1617 01/14/24 2124 01/15/24 0735  GLUCAP 109* 70 145* 104* 109*    Iron Studies: No results for input(s): "IRON", "TIBC", "TRANSFERRIN", "FERRITIN" in the  last 72 hours. Studies/Results: ECHOCARDIOGRAM COMPLETE Result Date: 01/14/2024    ECHOCARDIOGRAM REPORT   Patient Name:   Lindsay Cortez Date of Exam: 01/14/2024 Medical Rec #:  [id number]       Height:       59.0 in Accession #:    [id number]      Weight:       108.0 lb Date of Birth:  1941/06/11      BSA:          1.420 m Patient Age:    82 years        BP:           170/80 mmHg Patient Gender: F               HR:           75 bpm. Exam Location:  [facility] Procedure: 2D Echo, Cardiac Doppler and Color Doppler (Both Spectral and Color            Flow Doppler were utilized during procedure). Indications:    Atrial Fibrillation I48.91  History:  Patient has prior history of Echocardiogram examinations, most                 recent 03/01/2021. COPD; Risk Factors:Hypertension and Diabetes.                 ESRD on dialysis [facility]).  Sonographer:    Denese Finn RCS Referring Phys: [phone] EJIROGHENE E EMOKPAE IMPRESSIONS  1. Left ventricular ejection fraction, by estimation, is 60 to 65%. The left ventricle has normal function. The left ventricle has no regional wall motion abnormalities. There is mild left ventricular hypertrophy. Left ventricular diastolic parameters are indeterminate. Elevated left atrial pressure.  2. Right ventricular systolic function is low normal. The right ventricular size is normal. There is mildly elevated pulmonary artery systolic pressure.  3. Left atrial size was severely dilated.  4. Right atrial size was mildly dilated.  5. The mitral valve is normal in structure. Trivial mitral valve regurgitation. No evidence of mitral stenosis.  6. The aortic valve was not well visualized. There is mild calcification of the aortic valve. There is mild thickening of the aortic valve. Aortic valve regurgitation is not visualized. No aortic stenosis is present.  7. The inferior vena cava is dilated in size with <50% respiratory variability, suggesting right atrial pressure of 15 mmHg. FINDINGS  Left  Ventricle: Left ventricular ejection fraction, by estimation, is 60 to 65%. The left ventricle has normal function. The left ventricle has no regional wall motion abnormalities. The left ventricular internal cavity size was normal in size. There is  mild left ventricular hypertrophy. Left ventricular diastolic parameters are indeterminate. Elevated left atrial pressure. Right Ventricle: The right ventricular size is normal. Right vetricular wall thickness was not well visualized. Right ventricular systolic function is low normal. There is mildly elevated pulmonary artery systolic pressure. The tricuspid regurgitant velocity is 2.45 m/s, and with an assumed right atrial pressure of 15 mmHg, the estimated right ventricular systolic pressure is 39.0 mmHg. Left Atrium: Left atrial size was severely dilated. Right Atrium: Right atrial size was mildly dilated. Pericardium: There is no evidence of pericardial effusion. Mitral Valve: The mitral valve is normal in structure. There is mild thickening of the mitral valve leaflet(s). There is mild calcification of the mitral valve leaflet(s). Mild mitral annular calcification. Trivial mitral valve regurgitation. No evidence  of mitral valve stenosis. Tricuspid Valve: The tricuspid valve is normal in structure. Tricuspid valve regurgitation is mild . No evidence of tricuspid stenosis. Aortic Valve: The aortic valve was not well visualized. There is mild calcification of the aortic valve. There is mild thickening of the aortic valve. There is mild aortic valve annular calcification. Aortic valve regurgitation is not visualized. No aortic stenosis is present. Aortic valve mean gradient measures 6.1 mmHg. Aortic valve peak gradient measures 13.3 mmHg. Aortic valve area, by VTI measures 1.61 cm. Pulmonic Valve: The pulmonic valve was not well visualized. Pulmonic valve regurgitation is mild. No evidence of pulmonic stenosis. Aorta: The aortic root is normal in size and structure.  Venous: The inferior vena cava is dilated in size with less than 50% respiratory variability, suggesting right atrial pressure of 15 mmHg. IAS/Shunts: No atrial level shunt detected by color flow Doppler.  LEFT VENTRICLE PLAX 2D LVIDd:         4.30 cm   Diastology LVIDs:         2.30 cm   LV e' medial:    7.40 cm/s LV PW:  1.10 cm   LV E/e' medial:  19.7 LV IVS:        1.10 cm   LV e' lateral:   9.57 cm/s LVOT diam:     1.90 cm   LV E/e' lateral: 15.3 LV SV:         67 LV SV Index:   47 LVOT Area:     2.84 cm  RIGHT VENTRICLE RV S prime:     10.90 cm/s TAPSE (M-mode): 2.7 cm LEFT ATRIUM              Index        RIGHT ATRIUM           Index LA diam:        4.90 cm  3.45 cm/m   RA Area:     18.70 cm LA Vol (A2C):   122.0 ml 85.94 ml/m  RA Volume:   56.60 ml  39.87 ml/m LA Vol (A4C):   81.6 ml  57.48 ml/m LA Biplane Vol: 101.0 ml 71.15 ml/m  AORTIC VALVE AV Area (Vmax):    1.48 cm AV Area (Vmean):   1.51 cm AV Area (VTI):     1.61 cm AV Vmax:           182.57 cm/s AV Vmean:          113.099 cm/s AV VTI:            0.414 m AV Peak Grad:      13.3 mmHg AV Mean Grad:      6.1 mmHg LVOT Vmax:         95.30 cm/s LVOT Vmean:        60.300 cm/s LVOT VTI:          0.235 m LVOT/AV VTI ratio: 0.57  AORTA Ao Root diam: 2.90 cm MITRAL VALVE                TRICUSPID VALVE MV Area (PHT): 3.77 cm     TR Peak grad:   24.0 mmHg MV Decel Time: 201 msec     TR Vmax:        245.00 cm/s MR Peak grad: 95.6 mmHg MR Mean grad: 63.0 mmHg     SHUNTS MR Vmax:      489.00 cm/s   Systemic VTI:  0.24 m MR Vmean:     384.0 cm/s    Systemic Diam: 1.90 cm MV E velocity: 146.00 cm/s MV A velocity: 125.00 cm/s MV E/A ratio:  1.17 Armida Lander MD Electronically signed by Armida Lander MD Signature Date/Time: 01/14/2024/1:25:55 PM    Final    DG Chest Portable 1 View Result Date: 01/13/2024 CLINICAL DATA:  Dizziness. EXAM: PORTABLE CHEST 1 VIEW COMPARISON:  June 11, 2023. FINDINGS: Mild cardiomegaly is noted. Right internal  jugular dialysis catheter is unchanged. Lungs are clear. Bony thorax is unremarkable. IMPRESSION: No active disease. Electronically Signed   By: Rosalene Colon M.D.   On: 01/13/2024 15:38    allopurinol   200 mg Oral q AM   cephALEXin   250 mg Oral QHS   Chlorhexidine  Gluconate Cloth  6 each Topical Q0600   Chlorhexidine  Gluconate Cloth  6 each Topical Q0600   darbepoetin (ARANESP ) injection - DIALYSIS  40 mcg Subcutaneous Q Mon-1800   feeding supplement (NEPRO CARB STEADY)  237 mL Oral BID BM   fluticasone  furoate-vilanterol  1 puff Inhalation Daily   insulin  aspart  0-5 Units Subcutaneous QHS   insulin  aspart  0-9 Units Subcutaneous TID WC   [START ON 01/16/2024] levothyroxine   88 mcg Oral Q0600   metoprolol  tartrate  75 mg Oral BID    BMET    Component Value Date/Time   NA 133 (L) 01/15/2024 0438   K 3.4 (L) 01/15/2024 0438   CL 98 01/15/2024 0438   CO2 28 01/15/2024 0438   GLUCOSE 91 01/15/2024 0438   BUN 20 01/15/2024 0438   CREATININE 2.17 (H) 01/15/2024 0438   CREATININE 1.39 (H) 05/19/2014 1444   CALCIUM 8.2 (L) 01/15/2024 0438   GFRNONAA 22 (L) 01/15/2024 0438   GFRAA 26 (L) 12/23/2019 1319   CBC    Component Value Date/Time   WBC 9.9 01/15/2024 0438   RBC 3.10 (L) 01/15/2024 0438   HGB 9.6 (L) 01/15/2024 0438   HCT 30.3 (L) 01/15/2024 0438   PLT 195 01/15/2024 0438   MCV 97.7 01/15/2024 0438   MCH 31.0 01/15/2024 0438   MCHC 31.7 01/15/2024 0438   RDW 14.2 01/15/2024 0438   LYMPHSABS 1.4 01/13/2024 1522   MONOABS 0.8 01/13/2024 1522   EOSABS 0.1 01/13/2024 1522   BASOSABS 0.0 01/13/2024 1522    Dialysis Orders: Center: [facility]  on MWF . EDW 47kg HD [place] 3K/2.5Ca  Time 3:15 Heparin  1000 units bolus then 500 units/hr. Access RIJ TDC BFR 350 DFR 600        Assessment/Plan:  Atrial fib with RVR - she has converted to NSR after metoprolol  and IV diltiazem .  Feels much better today.   ESRD -   plan for HD on MWF to keep on her outpatient schedule   Hypertension/volume  -  bp was low with RVR but elevated this am.  UF with HD and follow.  Anemia  - was not on ESA prior to admission.  Hgb down to 9.2, will start ESA and follow  Metabolic bone disease -   continue with home meds  Nutrition -  renal diet, carb modified.  DM type 2 - per primary  Disposition - for discharge today.  She will follow up with her outpatient unit tomorrow for her regularly scheduled HD session.   Benjamin Brands, MD [facility]

## 2024-01-15 NOTE — Discharge Summary (Signed)
 Lindsay Cortez, is a 83 y.o. female  DOB [birth year]  MRN [id number].  Admission date:  01/13/2024  Admitting Physician  Pati Bonine, MD  Discharge Date:  01/15/2024   Primary MD  Kathyleen Parkins, MD  Recommendations for primary care physician for things to follow:  1)Very Low-salt diet advised---Less than 2 gm of Sodium per day advised----ok to use Mrs DASH salt substitute instead of Salt 2)Weigh yourself daily, call if you gain more than 3 pounds in 1 day or more than 5 pounds in 1 week as your hemodialysis schedule may need to be adjusted 3)Limit your Fluid  intake to no more than 50 ounces (1.5 Liters) per day 4)Watch for bleeding while on Blood Thinners--watch for blood in your stool which can make your stool black, maroon, mahogany or red---, blood in your urine which can make your urine pink or red, nosebleeds , also watch for possible bruising -You are taking Apixaban /Eliquis --- which is a blood thinner--- be careful to avoid injury or falls 5)Avoid ibuprofen/Advil/Aleve/Motrin/Goody Powders/Naproxen/BC powders/Meloxicam/Diclofenac/Indomethacin and other Nonsteroidal anti-inflammatory medications as these will make you more likely to bleed and can cause stomach ulcers, can also cause Kidney problems.   Admission Diagnosis  Atrial fibrillation with RVR (HCC) [I48.91]   Discharge Diagnosis  Atrial fibrillation with RVR (HCC) [I48.91]    Principal Problem:   Atrial fibrillation with RVR (HCC) Active Problems:   COPD (chronic obstructive pulmonary disease) (HCC)   Diabetes mellitus (HCC)   Hypothyroidism   Essential hypertension   ESRD on dialysis (HCC)   Prolonged QT interval      Past Medical History:  Diagnosis Date   Arthritis    Asthma    Back pain    COPD (chronic obstructive pulmonary disease) (HCC)    Diabetes (HCC) 09/01/2014   Diabetes mellitus    x 15 yrs   Gout     Hypertension    Kidney stones    Pancreatic cyst    Pneumonia    Renal disorder    Renal insufficiency    Shoulder dislocation    Vertigo     Past Surgical History:  Procedure Laterality Date   ABDOMINAL HYSTERECTOMY     AV FISTULA PLACEMENT Left 11/21/2022   Procedure: LEFT ARM ARTERIOVENOUS (AV) FISTULA CREATION;  Surgeon: Mayo Speck, MD;  Location: [facility];  Service: Vascular;  Laterality: Left;   BLADDER SURGERY     bladder tac   BREAST SURGERY Left    benign   FOOT SURGERY Bilateral    bunions     HPI  from the history and physical done on the day of admission:   Chief Complaint: Dizziness   HPI: Lindsay Cortez is a 83 y.o. female with medical history significant for ESRD on dialysis, COPD, diabetes mellitus,. Patient presented to the ED with complaints of sudden onset of dizziness, palpitations.  About an hour prior to arrival to the ED, symptoms started.  She reports chest tightness without chest pain, and mild difficulty breathing.  Reports lightheadedness,  but she did not pass out.  No lower extremity swelling.  Last dialysis was on Friday, she has not missed any sessions. No prior history of abnormal heart rhythms.    ED Course: Tachycardic to 149, temperature 97.6.  Respiratory rate 17-20.  Blood pressure systolic 105-131.  O2 sats greater than 95% on room air. Troponin 9 > 12.  Potassium 3.5.  Magnesium 2.2. EKG showing atrial fibrillation with rate of 145. IV metoprolol  2.5 mg given without improvement in heart rate. Propofol  given for sedation, and then cardioversion attempted in the ED twice, unsuccessful. Cardizem  and heparin  drip, started.  500 mL bolus given.   Review of Systems: As per HPI all other systems reviewed and negative.   Hospital Course:     Brief Narrative:   83 y.o. female with medical history significant for ESRD on dialysis, COPD, diabetes mellitus admitted on 01/13/2024 after presenting with dizziness and near syncope and is found to be  in A-fib with RVR     -Assessment and Plan: 1)New Onset Atrial fibrillation with RVR  - Heart rate in the 140s at presentation - No further dizziness or near syncope = Patient initially failed DC cardioversion x 2 attempts in the ED - Rate control much improved on metoprolol  and IV Cardizem  -Patient has converted to sinus at this time -Last echo in 2022-EF of 65 to 70%, repeat echo requested at this time - [facility]- at least 5 for- age, gender, hypertension and diabetes --treated with IV heparin ,  discharge on Eliquis  - give Toprol  XL 50 mg daily - Potassium 3.5, magnesium 1.7, troponin 9 > 12 =-TSH is 0.027, TSH was 0.185 on 03/02/2021---- reduce Levothyroxine  to 88 mcg from 112 mcg   2)ESRD on dialysis (HCC) Schedule - M/W/F.  No sign of volume overload.Has not missed any sessions.   -- Still makes a good amount of urine. - Nephrology consult for HD per MWF schedule appreciated   3)Hypothyroidism---TSH is 0.027, TSH was 0.185 on 03/02/2021----reduce Levothyroxine  to 88 mcg from 112 mcg   4)Prolonged QT interval QT prolonged at 521.   History of prolonged QT syndrome. - Keep electrolytes within range   5)HTN -BP was borderline low on admission due to A-fib with RVR with poor perfusion -c/n home Toprol  XL 50mg   for rate control   6)DM2-A1c was 5.1 reflecting excellent diabetic control PTA PCP to Consider Dc of Linagliptin -- -given advanced age and A1c of 5.1 patient is at risk for hypoglycemic episodes   7)COPD (chronic obstructive pulmonary disease) (HCC) Stable. - No flareup - Continue Breo Ellipta    8) chronic anemia of ESRD--- Hgb currently above 9 - Defer timing and dose of ESA agent/Procrit to nephrology team   Disposition: The patient is from: Home              Anticipated d/c is to: Home  Discharge Condition: Stable  Follow UP--- PCP  Consults obtained -nephrology  Diet and Activity recommendation:  As advised  Discharge Instructions    Discharge  Instructions     Call MD for:  difficulty breathing, headache or visual disturbances   Complete by: As directed    Call MD for:  persistant dizziness or light-headedness   Complete by: As directed    Call MD for:  persistant nausea and vomiting   Complete by: As directed    Call MD for:  temperature >100.4   Complete by: As directed    Diet - low sodium heart healthy   Complete  by: As directed    Diet Carb Modified   Complete by: As directed    Discharge instructions   Complete by: As directed    1)Very Low-salt diet advised---Less than 2 gm of Sodium per day advised----ok to use Mrs DASH salt substitute instead of Salt 2)Weigh yourself daily, call if you gain more than 3 pounds in 1 day or more than 5 pounds in 1 week as your hemodialysis schedule may need to be adjusted 3)Limit your Fluid  intake to no more than 50 ounces (1.5 Liters) per day 4)Watch for bleeding while on Blood Thinners--watch for blood in your stool which can make your stool black, maroon, mahogany or red---, blood in your urine which can make your urine pink or red, nosebleeds , also watch for possible bruising -You are taking Apixaban /Eliquis --- which is a blood thinner--- be careful to avoid injury or falls 5)Avoid ibuprofen/Advil/Aleve/Motrin/Goody Powders/Naproxen/BC powders/Meloxicam/Diclofenac/Indomethacin and other Nonsteroidal anti-inflammatory medications as these will make you more likely to bleed and can cause stomach ulcers, can also cause Kidney problems.   Increase activity slowly   Complete by: As directed    No dressing needed   Complete by: As directed         Discharge Medications     Allergies as of 01/15/2024       Reactions   Codeine Nausea And Vomiting   Narcotic pain medicine makes her nauseated.  Says was told to only take tylenol  due to kidney disease.   Fentanyl  Nausea And Vomiting   Levaquin  [levofloxacin  In D5w] Nausea And Vomiting   Levofloxacin  Nausea And Vomiting         Medication List     TAKE these medications    allopurinol  100 MG tablet Commonly known as: ZYLOPRIM  Take 200 mg by mouth in the morning.   apixaban  5 MG Tabs tablet Commonly known as: ELIQUIS  Take 1 tablet (5 mg total) by mouth 2 (two) times daily. For stroke prevention due to atrial fibrillation   Breo Ellipta  100-25 MCG/ACT Aepb Generic drug: fluticasone  furoate-vilanterol Inhale 1 puff into the lungs daily.   CALCIUM 600 + D PO Take 1 tablet by mouth every evening.   cephALEXin  250 MG capsule Commonly known as: KEFLEX  Take 250 mg by mouth at bedtime.   furosemide  20 MG tablet Commonly known as: LASIX  Take 20 mg by mouth daily as needed for fluid or edema.   levothyroxine  88 MCG tablet Commonly known as: SYNTHROID  Take 1 tablet (88 mcg total) by mouth daily before breakfast. Start taking on: Jan 16, 2024 What changed:  medication strength how much to take   loperamide  2 MG capsule Commonly known as: IMODIUM  Take 2 mg by mouth in the morning.   metoprolol  succinate 50 MG 24 hr tablet Commonly known as: TOPROL -XL Take 50 mg by mouth in the morning.   simvastatin  10 MG tablet Commonly known as: ZOCOR  Take 10 mg by mouth at bedtime.   Tradjenta 5 MG Tabs tablet Generic drug: linagliptin Take 5 mg by mouth daily.   Vitamin D  50 MCG (2000 UT) Caps Take 2,000 Units by mouth every evening.   zinc sulfate (50mg  elemental zinc) 220 (50 Zn) MG capsule Take 220 mg by mouth in the morning.               Discharge Care Instructions  (From admission, onward)           Start     Ordered   01/15/24 0000  No  dressing needed        01/15/24 1113           Major procedures and Radiology Reports - PLEASE review detailed and final reports for all details, in brief -   ECHOCARDIOGRAM COMPLETE Result Date: 01/14/2024    ECHOCARDIOGRAM REPORT   Patient Name:   KEMANI HEIDEL Date of Exam: 01/14/2024 Medical Rec #:  [id number]       Height:       59.0 in  Accession #:    [id number]      Weight:       108.0 lb Date of Birth:  05-30-1941      BSA:          1.420 m Patient Age:    82 years        BP:           170/80 mmHg Patient Gender: F               HR:           75 bpm. Exam Location:  [facility] Procedure: 2D Echo, Cardiac Doppler and Color Doppler (Both Spectral and Color            Flow Doppler were utilized during procedure). Indications:    Atrial Fibrillation I48.91  History:        Patient has prior history of Echocardiogram examinations, most                 recent 03/01/2021. COPD; Risk Factors:Hypertension and Diabetes.                 ESRD on dialysis [facility]).  Sonographer:    Denese Finn RCS Referring Phys: [phone] EJIROGHENE E Davine Sweney IMPRESSIONS  1. Left ventricular ejection fraction, by estimation, is 60 to 65%. The left ventricle has normal function. The left ventricle has no regional wall motion abnormalities. There is mild left ventricular hypertrophy. Left ventricular diastolic parameters are indeterminate. Elevated left atrial pressure.  2. Right ventricular systolic function is low normal. The right ventricular size is normal. There is mildly elevated pulmonary artery systolic pressure.  3. Left atrial size was severely dilated.  4. Right atrial size was mildly dilated.  5. The mitral valve is normal in structure. Trivial mitral valve regurgitation. No evidence of mitral stenosis.  6. The aortic valve was not well visualized. There is mild calcification of the aortic valve. There is mild thickening of the aortic valve. Aortic valve regurgitation is not visualized. No aortic stenosis is present.  7. The inferior vena cava is dilated in size with <50% respiratory variability, suggesting right atrial pressure of 15 mmHg. FINDINGS  Left Ventricle: Left ventricular ejection fraction, by estimation, is 60 to 65%. The left ventricle has normal function. The left ventricle has no regional wall motion abnormalities. The left ventricular internal cavity size  was normal in size. There is  mild left ventricular hypertrophy. Left ventricular diastolic parameters are indeterminate. Elevated left atrial pressure. Right Ventricle: The right ventricular size is normal. Right vetricular wall thickness was not well visualized. Right ventricular systolic function is low normal. There is mildly elevated pulmonary artery systolic pressure. The tricuspid regurgitant velocity is 2.45 m/s, and with an assumed right atrial pressure of 15 mmHg, the estimated right ventricular systolic pressure is 39.0 mmHg. Left Atrium: Left atrial size was severely dilated. Right Atrium: Right atrial size was mildly dilated. Pericardium: There is no evidence of pericardial effusion. Mitral Valve: The mitral valve  is normal in structure. There is mild thickening of the mitral valve leaflet(s). There is mild calcification of the mitral valve leaflet(s). Mild mitral annular calcification. Trivial mitral valve regurgitation. No evidence  of mitral valve stenosis. Tricuspid Valve: The tricuspid valve is normal in structure. Tricuspid valve regurgitation is mild . No evidence of tricuspid stenosis. Aortic Valve: The aortic valve was not well visualized. There is mild calcification of the aortic valve. There is mild thickening of the aortic valve. There is mild aortic valve annular calcification. Aortic valve regurgitation is not visualized. No aortic stenosis is present. Aortic valve mean gradient measures 6.1 mmHg. Aortic valve peak gradient measures 13.3 mmHg. Aortic valve area, by VTI measures 1.61 cm. Pulmonic Valve: The pulmonic valve was not well visualized. Pulmonic valve regurgitation is mild. No evidence of pulmonic stenosis. Aorta: The aortic root is normal in size and structure. Venous: The inferior vena cava is dilated in size with less than 50% respiratory variability, suggesting right atrial pressure of 15 mmHg. IAS/Shunts: No atrial level shunt detected by color flow Doppler.  LEFT VENTRICLE  PLAX 2D LVIDd:         4.30 cm   Diastology LVIDs:         2.30 cm   LV e' medial:    7.40 cm/s LV PW:         1.10 cm   LV E/e' medial:  19.7 LV IVS:        1.10 cm   LV e' lateral:   9.57 cm/s LVOT diam:     1.90 cm   LV E/e' lateral: 15.3 LV SV:         67 LV SV Index:   47 LVOT Area:     2.84 cm  RIGHT VENTRICLE RV S prime:     10.90 cm/s TAPSE (M-mode): 2.7 cm LEFT ATRIUM              Index        RIGHT ATRIUM           Index LA diam:        4.90 cm  3.45 cm/m   RA Area:     18.70 cm LA Vol (A2C):   122.0 ml 85.94 ml/m  RA Volume:   56.60 ml  39.87 ml/m LA Vol (A4C):   81.6 ml  57.48 ml/m LA Biplane Vol: 101.0 ml 71.15 ml/m  AORTIC VALVE AV Area (Vmax):    1.48 cm AV Area (Vmean):   1.51 cm AV Area (VTI):     1.61 cm AV Vmax:           182.57 cm/s AV Vmean:          113.099 cm/s AV VTI:            0.414 m AV Peak Grad:      13.3 mmHg AV Mean Grad:      6.1 mmHg LVOT Vmax:         95.30 cm/s LVOT Vmean:        60.300 cm/s LVOT VTI:          0.235 m LVOT/AV VTI ratio: 0.57  AORTA Ao Root diam: 2.90 cm MITRAL VALVE                TRICUSPID VALVE MV Area (PHT): 3.77 cm     TR Peak grad:   24.0 mmHg MV Decel Time: 201 msec     TR Vmax:  245.00 cm/s MR Peak grad: 95.6 mmHg MR Mean grad: 63.0 mmHg     SHUNTS MR Vmax:      489.00 cm/s   Systemic VTI:  0.24 m MR Vmean:     384.0 cm/s    Systemic Diam: 1.90 cm MV E velocity: 146.00 cm/s MV A velocity: 125.00 cm/s MV E/A ratio:  1.17 Armida Lander MD Electronically signed by Armida Lander MD Signature Date/Time: 01/14/2024/1:25:55 PM    Final    DG Chest Portable 1 View Result Date: 01/13/2024 CLINICAL DATA:  Dizziness. EXAM: PORTABLE CHEST 1 VIEW COMPARISON:  June 11, 2023. FINDINGS: Mild cardiomegaly is noted. Right internal jugular dialysis catheter is unchanged. Lungs are clear. Bony thorax is unremarkable. IMPRESSION: No active disease. Electronically Signed   By: Rosalene Colon M.D.   On: 01/13/2024 15:38   Micro Results   Recent  Results (from the past 240 hours)  MRSA Next Gen by PCR, Nasal     Status: None   Collection Time: 01/13/24  7:09 PM   Specimen: Nasal Mucosa; Nasal Swab  Result Value Ref Range Status   MRSA by PCR Next Gen NOT DETECTED NOT DETECTED Final    Comment: (NOTE) The GeneXpert MRSA Assay (FDA approved for NASAL specimens only), is one component of a comprehensive MRSA colonization surveillance program. It is not intended to diagnose MRSA infection nor to guide or monitor treatment for MRSA infections. Test performance is not FDA approved in patients less than 10 years old. Performed at [facility], [address], [place], [place] [address]     Today   Subjective    Sissy Goetzke today has no new complaints No fever  Or chills   No Nausea, Vomiting or Diarrhea - No chest pains, no palpitations, no dizziness, no dyspnea            Patient has been seen and examined prior to discharge   Objective   Blood pressure (!) 155/39, pulse 63, temperature (!) 97.4 F (36.3 C), temperature source Oral, resp. rate 17, height 4\' 11"  (1.499 m), weight 49.1 kg, SpO2 96%.   Intake/Output Summary (Last 24 hours) at 01/15/2024 1113 Last data filed at 01/15/2024 0947 Gross per 24 hour  Intake 279.51 ml  Output 1800 ml  Net -1520.49 ml    Exam Gen:- Awake Alert, no acute distress , no conversational dyspnea HEENT:- [place].AT, No sclera icterus Neck-Supple Neck, Rt internal jugular HD catheter  Lungs-  CTAB , good air movement bilaterally CV- S1, S2 normal, irregular, Not irregularly irregular Abd-  +ve B.Sounds, Abd Soft, No tenderness,    Extremity/Skin:- No  edema,   good pulses Psych-affect is appropriate, oriented x3 Neuro-no new focal deficits, no tremors  MSK-left arm AV fistula with thrill and bruit    Data Review   CBC w Diff:  Lab Results  Component Value Date   WBC 9.9 01/15/2024   HGB 9.6 (L) 01/15/2024   HCT 30.3 (L) 01/15/2024   PLT 195 01/15/2024   LYMPHOPCT 14  01/13/2024   MONOPCT 9 01/13/2024   EOSPCT 1 01/13/2024   BASOPCT 0 01/13/2024    CMP:  Lab Results  Component Value Date   NA 133 (L) 01/15/2024   K 3.4 (L) 01/15/2024   CL 98 01/15/2024   CO2 28 01/15/2024   BUN 20 01/15/2024   CREATININE 2.17 (H) 01/15/2024   CREATININE 1.39 (H) 05/19/2014   PROT 6.2 (L) 01/13/2024   ALBUMIN  3.2 (L) 01/15/2024  BILITOT 0.8 01/13/2024   ALKPHOS 88 01/13/2024   AST 36 01/13/2024   ALT 23 01/13/2024  .  Total Discharge time is about 33 minutes  Colin Dawley M.D on 01/15/2024 at 11:13 AM  Go to [email or web address] -  for contact info  [facility] - Office  [phone]

## 2024-01-15 NOTE — Plan of Care (Signed)
  Problem: Education: Goal: Knowledge of General Education information will improve Description: Including pain rating scale, medication(s)/side effects and non-pharmacologic comfort measures Outcome: Progressing   Problem: Health Behavior/Discharge Planning: Goal: Ability to manage health-related needs will improve Outcome: Progressing   Problem: Clinical Measurements: Goal: Ability to maintain clinical measurements within normal limits will improve Outcome: Progressing Goal: Will remain free from infection Outcome: Progressing Goal: Diagnostic test results will improve Outcome: Progressing Goal: Respiratory complications will improve Outcome: Progressing Goal: Cardiovascular complication will be avoided Outcome: Progressing   Problem: Activity: Goal: Risk for activity intolerance will decrease Outcome: Progressing   Problem: Nutrition: Goal: Adequate nutrition will be maintained Outcome: Progressing   Problem: Coping: Goal: Level of anxiety will decrease Outcome: Progressing   Problem: Elimination: Goal: Will not experience complications related to bowel motility Outcome: Progressing Goal: Will not experience complications related to urinary retention Outcome: Progressing   Problem: Pain Managment: Goal: General experience of comfort will improve and/or be controlled Outcome: Progressing   Problem: Safety: Goal: Ability to remain free from injury will improve Outcome: Progressing   Problem: Skin Integrity: Goal: Risk for impaired skin integrity will decrease Outcome: Progressing   Problem: Education: Goal: Knowledge of disease or condition will improve Outcome: Progressing Goal: Understanding of medication regimen will improve Outcome: Progressing Goal: Individualized Educational Video(s) Outcome: Progressing   Problem: Activity: Goal: Ability to tolerate increased activity will improve Outcome: Progressing   Problem: Cardiac: Goal: Ability to achieve  and maintain adequate cardiopulmonary perfusion will improve Outcome: Progressing   Problem: Health Behavior/Discharge Planning: Goal: Ability to safely manage health-related needs after discharge will improve Outcome: Progressing   Problem: Education: Goal: Ability to describe self-care measures that may prevent or decrease complications (Diabetes Survival Skills Education) will improve Outcome: Progressing Goal: Individualized Educational Video(s) Outcome: Progressing   Problem: Coping: Goal: Ability to adjust to condition or change in health will improve Outcome: Progressing   Problem: Fluid Volume: Goal: Ability to maintain a balanced intake and output will improve Outcome: Progressing   Problem: Health Behavior/Discharge Planning: Goal: Ability to identify and utilize available resources and services will improve Outcome: Progressing Goal: Ability to manage health-related needs will improve Outcome: Progressing   Problem: Metabolic: Goal: Ability to maintain appropriate glucose levels will improve Outcome: Progressing   Problem: Nutritional: Goal: Maintenance of adequate nutrition will improve Outcome: Progressing Goal: Progress toward achieving an optimal weight will improve Outcome: Progressing   Problem: Skin Integrity: Goal: Risk for impaired skin integrity will decrease Outcome: Progressing   Problem: Tissue Perfusion: Goal: Adequacy of tissue perfusion will improve Outcome: Progressing

## 2024-01-22 DIAGNOSIS — M1030 Gout due to renal impairment, unspecified site: Secondary | ICD-10-CM | POA: Diagnosis not present

## 2024-01-22 DIAGNOSIS — Z792 Long term (current) use of antibiotics: Secondary | ICD-10-CM | POA: Diagnosis not present

## 2024-01-22 DIAGNOSIS — D519 Vitamin B12 deficiency anemia, unspecified: Secondary | ICD-10-CM | POA: Diagnosis not present

## 2024-01-22 DIAGNOSIS — E1122 Type 2 diabetes mellitus with diabetic chronic kidney disease: Secondary | ICD-10-CM | POA: Diagnosis not present

## 2024-01-22 DIAGNOSIS — F419 Anxiety disorder, unspecified: Secondary | ICD-10-CM | POA: Diagnosis not present

## 2024-01-22 DIAGNOSIS — Z556 Problems related to health literacy: Secondary | ICD-10-CM | POA: Diagnosis not present

## 2024-01-22 DIAGNOSIS — G8929 Other chronic pain: Secondary | ICD-10-CM | POA: Diagnosis not present

## 2024-01-22 DIAGNOSIS — R1310 Dysphagia, unspecified: Secondary | ICD-10-CM | POA: Diagnosis not present

## 2024-01-22 DIAGNOSIS — I959 Hypotension, unspecified: Secondary | ICD-10-CM | POA: Diagnosis not present

## 2024-01-22 DIAGNOSIS — E1165 Type 2 diabetes mellitus with hyperglycemia: Secondary | ICD-10-CM | POA: Diagnosis not present

## 2024-01-22 DIAGNOSIS — I120 Hypertensive chronic kidney disease with stage 5 chronic kidney disease or end stage renal disease: Secondary | ICD-10-CM | POA: Diagnosis not present

## 2024-01-22 DIAGNOSIS — J4489 Other specified chronic obstructive pulmonary disease: Secondary | ICD-10-CM | POA: Diagnosis not present

## 2024-01-22 DIAGNOSIS — I700 Atherosclerosis of aorta: Secondary | ICD-10-CM | POA: Diagnosis not present

## 2024-01-22 DIAGNOSIS — F32A Depression, unspecified: Secondary | ICD-10-CM | POA: Diagnosis not present

## 2024-01-22 DIAGNOSIS — I4891 Unspecified atrial fibrillation: Secondary | ICD-10-CM | POA: Diagnosis not present

## 2024-01-22 DIAGNOSIS — Z7901 Long term (current) use of anticoagulants: Secondary | ICD-10-CM | POA: Diagnosis not present

## 2024-01-22 DIAGNOSIS — I4581 Long QT syndrome: Secondary | ICD-10-CM | POA: Diagnosis not present

## 2024-01-22 DIAGNOSIS — K589 Irritable bowel syndrome without diarrhea: Secondary | ICD-10-CM | POA: Diagnosis not present

## 2024-01-22 DIAGNOSIS — E1140 Type 2 diabetes mellitus with diabetic neuropathy, unspecified: Secondary | ICD-10-CM | POA: Diagnosis not present

## 2024-01-22 DIAGNOSIS — I4510 Unspecified right bundle-branch block: Secondary | ICD-10-CM | POA: Diagnosis not present

## 2024-01-22 DIAGNOSIS — M810 Age-related osteoporosis without current pathological fracture: Secondary | ICD-10-CM | POA: Diagnosis not present

## 2024-01-22 DIAGNOSIS — E039 Hypothyroidism, unspecified: Secondary | ICD-10-CM | POA: Diagnosis not present

## 2024-01-22 DIAGNOSIS — K5900 Constipation, unspecified: Secondary | ICD-10-CM | POA: Diagnosis not present

## 2024-01-22 DIAGNOSIS — Z7984 Long term (current) use of oral hypoglycemic drugs: Secondary | ICD-10-CM | POA: Diagnosis not present

## 2024-01-22 DIAGNOSIS — N186 End stage renal disease: Secondary | ICD-10-CM | POA: Diagnosis not present

## 2024-01-22 DIAGNOSIS — I951 Orthostatic hypotension: Secondary | ICD-10-CM | POA: Diagnosis not present

## 2024-01-22 DIAGNOSIS — Z7983 Long term (current) use of bisphosphonates: Secondary | ICD-10-CM | POA: Diagnosis not present

## 2024-01-22 DIAGNOSIS — M1711 Unilateral primary osteoarthritis, right knee: Secondary | ICD-10-CM | POA: Diagnosis not present

## 2024-01-22 DIAGNOSIS — D631 Anemia in chronic kidney disease: Secondary | ICD-10-CM | POA: Diagnosis not present

## 2024-01-22 DIAGNOSIS — E782 Mixed hyperlipidemia: Secondary | ICD-10-CM | POA: Diagnosis not present

## 2024-01-22 DIAGNOSIS — Z7951 Long term (current) use of inhaled steroids: Secondary | ICD-10-CM | POA: Diagnosis not present

## 2024-01-22 DIAGNOSIS — M519 Unspecified thoracic, thoracolumbar and lumbosacral intervertebral disc disorder: Secondary | ICD-10-CM | POA: Diagnosis not present

## 2024-01-22 DIAGNOSIS — J309 Allergic rhinitis, unspecified: Secondary | ICD-10-CM | POA: Diagnosis not present

## 2024-01-22 DIAGNOSIS — Z992 Dependence on renal dialysis: Secondary | ICD-10-CM | POA: Diagnosis not present

## 2024-01-24 ENCOUNTER — Other Ambulatory Visit (HOSPITAL_COMMUNITY): Payer: Self-pay | Admitting: Internal Medicine

## 2024-01-24 DIAGNOSIS — I120 Hypertensive chronic kidney disease with stage 5 chronic kidney disease or end stage renal disease: Secondary | ICD-10-CM | POA: Diagnosis not present

## 2024-01-24 DIAGNOSIS — R55 Syncope and collapse: Secondary | ICD-10-CM

## 2024-01-24 DIAGNOSIS — I4891 Unspecified atrial fibrillation: Secondary | ICD-10-CM | POA: Diagnosis not present

## 2024-01-24 DIAGNOSIS — Z6821 Body mass index (BMI) 21.0-21.9, adult: Secondary | ICD-10-CM | POA: Diagnosis not present

## 2024-01-24 DIAGNOSIS — R519 Headache, unspecified: Secondary | ICD-10-CM | POA: Diagnosis not present

## 2024-01-24 DIAGNOSIS — E1122 Type 2 diabetes mellitus with diabetic chronic kidney disease: Secondary | ICD-10-CM | POA: Diagnosis not present

## 2024-01-24 DIAGNOSIS — N186 End stage renal disease: Secondary | ICD-10-CM | POA: Diagnosis not present

## 2024-01-24 DIAGNOSIS — J449 Chronic obstructive pulmonary disease, unspecified: Secondary | ICD-10-CM | POA: Diagnosis not present

## 2024-01-24 DIAGNOSIS — D631 Anemia in chronic kidney disease: Secondary | ICD-10-CM | POA: Diagnosis not present

## 2024-01-24 DIAGNOSIS — I10 Essential (primary) hypertension: Secondary | ICD-10-CM | POA: Diagnosis not present

## 2024-01-27 DIAGNOSIS — Z992 Dependence on renal dialysis: Secondary | ICD-10-CM | POA: Diagnosis not present

## 2024-01-27 DIAGNOSIS — N186 End stage renal disease: Secondary | ICD-10-CM | POA: Diagnosis not present

## 2024-01-27 DIAGNOSIS — D631 Anemia in chronic kidney disease: Secondary | ICD-10-CM | POA: Diagnosis not present

## 2024-01-27 DIAGNOSIS — D509 Iron deficiency anemia, unspecified: Secondary | ICD-10-CM | POA: Diagnosis not present

## 2024-01-29 ENCOUNTER — Ambulatory Visit (HOSPITAL_COMMUNITY)
Admission: RE | Admit: 2024-01-29 | Discharge: 2024-01-29 | Disposition: A | Discharge status: Home / Self Care | Source: Ambulatory Visit | Attending: Internal Medicine | Admitting: Internal Medicine

## 2024-01-29 DIAGNOSIS — R519 Headache, unspecified: Secondary | ICD-10-CM | POA: Diagnosis not present

## 2024-01-29 DIAGNOSIS — R55 Syncope and collapse: Secondary | ICD-10-CM | POA: Diagnosis not present

## 2024-02-04 DIAGNOSIS — N186 End stage renal disease: Secondary | ICD-10-CM | POA: Diagnosis not present

## 2024-02-04 DIAGNOSIS — I120 Hypertensive chronic kidney disease with stage 5 chronic kidney disease or end stage renal disease: Secondary | ICD-10-CM | POA: Diagnosis not present

## 2024-02-04 DIAGNOSIS — E1122 Type 2 diabetes mellitus with diabetic chronic kidney disease: Secondary | ICD-10-CM | POA: Diagnosis not present

## 2024-02-04 DIAGNOSIS — D631 Anemia in chronic kidney disease: Secondary | ICD-10-CM | POA: Diagnosis not present

## 2024-02-09 ENCOUNTER — Emergency Department (HOSPITAL_COMMUNITY)

## 2024-02-09 ENCOUNTER — Encounter (HOSPITAL_COMMUNITY): Payer: Self-pay

## 2024-02-09 ENCOUNTER — Emergency Department (HOSPITAL_COMMUNITY)
Admission: EM | Admit: 2024-02-09 | Discharge: 2024-02-09 | Disposition: A | Discharge status: Home / Self Care | Attending: Emergency Medicine | Admitting: Emergency Medicine

## 2024-02-09 ENCOUNTER — Other Ambulatory Visit: Payer: Self-pay

## 2024-02-09 DIAGNOSIS — Z7901 Long term (current) use of anticoagulants: Secondary | ICD-10-CM | POA: Diagnosis not present

## 2024-02-09 DIAGNOSIS — I499 Cardiac arrhythmia, unspecified: Secondary | ICD-10-CM | POA: Diagnosis not present

## 2024-02-09 DIAGNOSIS — I4891 Unspecified atrial fibrillation: Secondary | ICD-10-CM | POA: Diagnosis not present

## 2024-02-09 DIAGNOSIS — R000 Tachycardia, unspecified: Secondary | ICD-10-CM | POA: Diagnosis not present

## 2024-02-09 DIAGNOSIS — Z992 Dependence on renal dialysis: Secondary | ICD-10-CM | POA: Insufficient documentation

## 2024-02-09 DIAGNOSIS — R55 Syncope and collapse: Secondary | ICD-10-CM | POA: Diagnosis not present

## 2024-02-09 DIAGNOSIS — N186 End stage renal disease: Secondary | ICD-10-CM | POA: Diagnosis not present

## 2024-02-09 DIAGNOSIS — R002 Palpitations: Secondary | ICD-10-CM | POA: Diagnosis not present

## 2024-02-09 DIAGNOSIS — R0902 Hypoxemia: Secondary | ICD-10-CM | POA: Diagnosis not present

## 2024-02-09 HISTORY — DX: Unspecified atrial fibrillation: I48.91

## 2024-02-09 LAB — COMPREHENSIVE METABOLIC PANEL WITH GFR
ALT: 28 U/L (ref 0–44)
AST: 48 U/L — ABNORMAL HIGH (ref 15–41)
Albumin: 3 g/dL — ABNORMAL LOW (ref 3.5–5.0)
Alkaline Phosphatase: 82 U/L (ref 38–126)
Anion gap: 11 (ref 5–15)
BUN: 34 mg/dL — ABNORMAL HIGH (ref 8–23)
CO2: 21 mmol/L — ABNORMAL LOW (ref 22–32)
Calcium: 7.9 mg/dL — ABNORMAL LOW (ref 8.9–10.3)
Chloride: 107 mmol/L (ref 98–111)
Creatinine, Ser: 3.06 mg/dL — ABNORMAL HIGH (ref 0.44–1.00)
GFR, Estimated: 15 mL/min — ABNORMAL LOW (ref >60)
Glucose, Bld: 141 mg/dL — ABNORMAL HIGH (ref 70–99)
Potassium: 4.2 mmol/L (ref 3.5–5.1)
Sodium: 139 mmol/L (ref 135–145)
Total Bilirubin: 0.9 mg/dL (ref 0.0–1.2)
Total Protein: 5.6 g/dL — ABNORMAL LOW (ref 6.5–8.1)

## 2024-02-09 LAB — CBC WITH DIFFERENTIAL/PLATELET
Abs Immature Granulocytes: 0.06 10*3/uL (ref 0.00–0.07)
Basophils Absolute: 0 10*3/uL (ref 0.0–0.1)
Basophils Relative: 0 %
Eosinophils Absolute: 0 10*3/uL (ref 0.0–0.5)
Eosinophils Relative: 0 %
HCT: 32.9 % — ABNORMAL LOW (ref 36.0–46.0)
Hemoglobin: 10.8 g/dL — ABNORMAL LOW (ref 12.0–15.0)
Immature Granulocytes: 1 %
Lymphocytes Relative: 19 %
Lymphs Abs: 1.9 10*3/uL (ref 0.7–4.0)
MCH: 32.6 pg (ref 26.0–34.0)
MCHC: 32.8 g/dL (ref 30.0–36.0)
MCV: 99.4 fL (ref 80.0–100.0)
Monocytes Absolute: 1 10*3/uL (ref 0.1–1.0)
Monocytes Relative: 10 %
Neutro Abs: 6.9 10*3/uL (ref 1.7–7.7)
Neutrophils Relative %: 70 %
Platelets: 240 10*3/uL (ref 150–400)
RBC: 3.31 MIL/uL — ABNORMAL LOW (ref 3.87–5.11)
RDW: 14.3 % (ref 11.5–15.5)
WBC: 10 10*3/uL (ref 4.0–10.5)
nRBC: 0 % (ref 0.0–0.2)

## 2024-02-09 LAB — TROPONIN I (HIGH SENSITIVITY): Troponin I (High Sensitivity): 8 ng/L (ref <18)

## 2024-02-09 LAB — MAGNESIUM: Magnesium: 1.8 mg/dL (ref 1.7–2.4)

## 2024-02-09 MED ORDER — DILTIAZEM LOAD VIA INFUSION
10.0000 mg | Freq: Once | INTRAVENOUS | Status: AC
  Administered 2024-02-09: 10 mg via INTRAVENOUS
  Filled 2024-02-09: qty 10

## 2024-02-09 MED ORDER — SODIUM CHLORIDE 0.9 % IV BOLUS
500.0000 mL | Freq: Once | INTRAVENOUS | Status: AC
  Administered 2024-02-09: 500 mL via INTRAVENOUS

## 2024-02-09 MED ORDER — DILTIAZEM HCL-DEXTROSE 125-5 MG/125ML-% IV SOLN (PREMIX)
5.0000 mg/h | INTRAVENOUS | Status: DC
  Administered 2024-02-09: 2 mg/h via INTRAVENOUS
  Filled 2024-02-09: qty 125

## 2024-02-09 MED ORDER — KETAMINE HCL 10 MG/ML IJ SOLN
INTRAMUSCULAR | Status: AC
  Administered 2024-02-09: 25 mg
  Filled 2024-02-09: qty 1

## 2024-02-09 MED ORDER — ONDANSETRON HCL 4 MG/2ML IJ SOLN
4.0000 mg | Freq: Once | INTRAMUSCULAR | Status: AC
  Administered 2024-02-09: 4 mg via INTRAVENOUS
  Filled 2024-02-09: qty 2

## 2024-02-09 MED ORDER — KETAMINE HCL 50 MG/5ML IJ SOSY: 1.0000 mg/kg | PREFILLED_SYRINGE | Freq: Once | INTRAMUSCULAR | Status: DC

## 2024-02-09 NOTE — ED Notes (Signed)
 Verbal order to D/C cardizem  drip Dr. Zammit.

## 2024-02-09 NOTE — Discharge Instructions (Signed)
 Your testing has been reassuring, your heart rate is back to normal, you will need to follow-up with the atrial fibrillation clinic and with Dr. Amanda Jungling.  I have sent in referrals for you to be seen at both of these clinics but I would call Monday morning.  Please see the phone numbers above.  If this happens again where you have developed a fast heart rate severe lightheadedness or any other worsening symptoms return to the emergency department immediately.  Please make sure that you are taking your Eliquis  exactly as prescribed

## 2024-02-09 NOTE — ED Notes (Signed)
 Pt verbalized no CPR and sister at bedside.

## 2024-02-09 NOTE — ED Provider Notes (Signed)
 [place] EMERGENCY DEPARTMENT AT [facility] Provider Note   CSN: [id number] Arrival date & time: 02/09/24  1229     Patient presents with: Atrial Fibrillation   Lindsay Cortez is a 83 y.o. female.  {Add pertinent medical, surgical, social history, OB history to QMV:78469}  Atrial Fibrillation   This patient is an 83 year old female who is on dialysis for end-stage renal disease for the last 9 months, she has a Port-A-Cath in her right upper chest or a dialysis catheter which she uses.  The patient had been admitted to the hospital approximately 1 month ago, during that time the patient was noted to be in new onset atrial fibrillation.  She was cardioverted twice but failed cardioversion and remained in A-fib, ultimately she was placed on diltiazem  and brought into the hospital.  She was discharged on the 20th on Eliquis  and states that she has not missed any medications.  She presents today with a complaint of near syncope that started about 3:00 in the morning when she got up to use the bathroom and felt like her heart was racing, felt like she was going to pass out, she used the bathroom and went back to bed, she was able to fall asleep and when she woke up this morning and throughout the last 4 hours she has had a persistent feeling of going to pass out.  She lives by herself and was very scared that she might pass out with no one around so she called the paramedics who found the patient to be in atrial fibrillation with a rapid ventricular rate.  Borderline hypotension.  The patient denies any history of prior cardiac disease, she is having palpitations and feeling like her heart is running away.  She denies feeling short of breath or febrile or nauseated or vomiting and has no swelling of her legs.  Since discharge it does not appear that the patient has followed up with cardiology    Prior to Admission medications   Medication Sig Start Date End Date Taking? Authorizing  Provider  allopurinol  (ZYLOPRIM ) 100 MG tablet Take 200 mg by mouth in the morning.    [provider]  apixaban  (ELIQUIS ) 2.5 MG TABS tablet Take 1 tablet (2.5 mg total) by mouth 2 (two) times daily. 01/15/24   Colin Dawley, MD  BREO ELLIPTA  100-25 MCG/ACT AEPB Inhale 1 puff into the lungs daily. 05/29/23   [provider]  Calcium Carb-Cholecalciferol  (CALCIUM 600 + D PO) Take 1 tablet by mouth every evening.    [provider]  cephALEXin  (KEFLEX ) 250 MG capsule Take 250 mg by mouth at bedtime. 12/29/23   [provider]  Cholecalciferol  (VITAMIN D ) 2000 UNITS CAPS Take 2,000 Units by mouth every evening.    [provider]  furosemide  (LASIX ) 20 MG tablet Take 20 mg by mouth daily as needed for fluid or edema. 03/25/21   [provider]  levothyroxine  (SYNTHROID ) 88 MCG tablet Take 1 tablet (88 mcg total) by mouth daily before breakfast. 01/16/24   Quintella Buck, Courage, MD  loperamide  (IMODIUM ) 2 MG capsule Take 2 mg by mouth in the morning.    [provider]  metoprolol  succinate (TOPROL -XL) 50 MG 24 hr tablet Take 50 mg by mouth in the morning. 03/10/19   [provider]  simvastatin  (ZOCOR ) 10 MG tablet Take 10 mg by mouth at bedtime.    [provider]  TRADJENTA 5 MG TABS tablet Take 5 mg by mouth daily. 03/05/23  [provider]  zinc sulfate 220 (50 Zn) MG capsule Take 220 mg by mouth in the morning.    [provider]    Allergies: Codeine, Fentanyl , Levaquin  [levofloxacin  in d5w], and Levofloxacin     Review of Systems  All other systems reviewed and are negative.   Updated Vital Signs There were no vitals taken for this visit.  Physical Exam Vitals and nursing note reviewed.  Constitutional:      General: She is in acute distress.     Appearance: She is well-developed. She is ill-appearing.  HENT:     Head: Normocephalic and atraumatic.     Mouth/Throat:     Pharynx: No  oropharyngeal exudate.   Eyes:     General: No scleral icterus.       Right eye: No discharge.        Left eye: No discharge.     Conjunctiva/sclera: Conjunctivae normal.     Pupils: Pupils are equal, round, and reactive to light.   Neck:     Thyroid : No thyromegaly.     Vascular: No JVD.   Cardiovascular:     Rate and Rhythm: Tachycardia present. Rhythm irregular.     Heart sounds: Normal heart sounds. No murmur heard.    No friction rub. No gallop.  Pulmonary:     Effort: Pulmonary effort is normal. No respiratory distress.     Breath sounds: Normal breath sounds. No wheezing or rales.  Abdominal:     General: Bowel sounds are normal. There is no distension.     Palpations: Abdomen is soft. There is no mass.     Tenderness: There is no abdominal tenderness.   Musculoskeletal:        General: No tenderness. Normal range of motion.     Cervical back: Normal range of motion and neck supple.     Right lower leg: No edema.     Left lower leg: No edema.  Lymphadenopathy:     Cervical: No cervical adenopathy.   Skin:    General: Skin is warm and dry.     Findings: No erythema or rash.   Neurological:     General: No focal deficit present.     Mental Status: She is alert.     Coordination: Coordination normal.   Psychiatric:        Behavior: Behavior normal.     (all labs ordered are listed, but only abnormal results are displayed) Labs Reviewed - No data to display  EKG: None  Radiology: No results found.  {Document cardiac monitor, telemetry assessment procedure when appropriate:32947} Procedures   Medications Ordered in the ED  sodium chloride  0.9 % bolus 500 mL (has no administration in time range)      {Click here for ABCD2, HEART and other calculators REFRESH Note before signing:1}                              Medical Decision Making Amount and/or Complexity of Data Reviewed Labs: ordered. Radiology: ordered. ECG/medicine tests:  ordered.  Risk Prescription drug management.    This patient presents to the ED for concern of palpitations and near syncope, this involves an extensive number of treatment options, and is a complaint that carries with it a high risk of complications and morbidity.  The differential diagnosis includes rapid A-fib, seems less likely to be pulmonary embolism given that she is anticoagulated for the last 4 weeks and  has no edema, no coughing or fever to suggest pneumonia, could be ACS but no history of chest pain   Co morbidities / Chronic conditions that complicate the patient evaluation  In his renal disease on dialysis, has not missed any dialysis, she goes Monday Wednesday Friday and went yesterday for a full session   Additional history obtained:  Additional history obtained from EMR External records from outside source obtained and reviewed including prior admission and discharge summaries including failed cardioversion   Lab Tests:  I Ordered, and personally interpreted labs.  The pertinent results include:  ***   Imaging Studies ordered:  I ordered imaging studies including ***  I independently visualized and interpreted imaging which showed *** I agree with the radiologist interpretation   Cardiac Monitoring: / EKG:  The patient was maintained on a cardiac monitor.  I personally viewed and interpreted the cardiac monitored which showed an underlying rhythm of: ***   Problem List / ED Course / Critical interventions / Medication management  *** I ordered medication including ***   Reevaluation of the patient after these medicines showed that the patient *** I have reviewed the patients home medicines and have made adjustments as needed   Consultations Obtained:  I requested consultation with the cardiologist Dr. Chancy Comber,  and discussed lab and imaging findings as well as pertinent plan - they recommend: admission to medicine - rate control - see cards here on  Monday   Social Determinants of Health:  ESRD on dialysis    Test / Admission - Considered:  ***   {Document critical care time when appropriate  Document review of labs and clinical decision tools ie CHADS2VASC2, etc  Document your independent review of radiology images and any outside records  Document your discussion with family members, caretakers and with consultants  Document social determinants of health affecting pt's care  Document your decision making why or why not admission, treatments were needed:32947:::1}   Final diagnoses:  None    ED Discharge Orders     None

## 2024-02-09 NOTE — ED Triage Notes (Signed)
 Pt arrived [facility] from home with c/o heart pounding, and felt like she was going to pass out. [facility] gave in 22 g RFA, 12mg  of cardizem  for a rate of 160-200. Pt HR went to 150 after medication. NS bolus was started. Pt has fistula to left arm not working, port cath to right chest for M/W/F dialysis.

## 2024-02-09 NOTE — ED Notes (Signed)
 Sister gave verbal consent for cardioversion Timeout : 1:30pm Ketamine 2.5ml =25mg  by Dr. Annabell Key : 1:31pm Shock at 120 J :  1:35pm Shock 200 J 1:36pm Sister at bedside during whole procedure.  Pt talking at ;1:44pm

## 2024-02-09 NOTE — ED Notes (Signed)
 Pt continues to be in SR and stable.

## 2024-02-10 ENCOUNTER — Emergency Department (HOSPITAL_COMMUNITY)

## 2024-02-10 ENCOUNTER — Other Ambulatory Visit: Payer: Self-pay

## 2024-02-10 ENCOUNTER — Emergency Department (HOSPITAL_COMMUNITY)
Admission: EM | Admit: 2024-02-10 | Discharge: 2024-02-10 | Disposition: A | Discharge status: Home / Self Care | Attending: Emergency Medicine | Admitting: Emergency Medicine

## 2024-02-10 DIAGNOSIS — N186 End stage renal disease: Secondary | ICD-10-CM | POA: Diagnosis not present

## 2024-02-10 DIAGNOSIS — I499 Cardiac arrhythmia, unspecified: Secondary | ICD-10-CM | POA: Diagnosis not present

## 2024-02-10 DIAGNOSIS — Z7901 Long term (current) use of anticoagulants: Secondary | ICD-10-CM | POA: Diagnosis not present

## 2024-02-10 DIAGNOSIS — R0602 Shortness of breath: Secondary | ICD-10-CM | POA: Diagnosis present

## 2024-02-10 DIAGNOSIS — R0902 Hypoxemia: Secondary | ICD-10-CM | POA: Diagnosis not present

## 2024-02-10 DIAGNOSIS — I480 Paroxysmal atrial fibrillation: Secondary | ICD-10-CM | POA: Diagnosis not present

## 2024-02-10 DIAGNOSIS — I4891 Unspecified atrial fibrillation: Secondary | ICD-10-CM | POA: Diagnosis not present

## 2024-02-10 DIAGNOSIS — R55 Syncope and collapse: Secondary | ICD-10-CM | POA: Diagnosis not present

## 2024-02-10 DIAGNOSIS — Z992 Dependence on renal dialysis: Secondary | ICD-10-CM | POA: Insufficient documentation

## 2024-02-10 DIAGNOSIS — R42 Dizziness and giddiness: Secondary | ICD-10-CM | POA: Diagnosis not present

## 2024-02-10 LAB — CBC
HCT: 31.3 % — ABNORMAL LOW (ref 36.0–46.0)
Hemoglobin: 10.1 g/dL — ABNORMAL LOW (ref 12.0–15.0)
MCH: 32.2 pg (ref 26.0–34.0)
MCHC: 32.3 g/dL (ref 30.0–36.0)
MCV: 99.7 fL (ref 80.0–100.0)
Platelets: 204 10*3/uL (ref 150–400)
RBC: 3.14 MIL/uL — ABNORMAL LOW (ref 3.87–5.11)
RDW: 14.4 % (ref 11.5–15.5)
WBC: 7.4 10*3/uL (ref 4.0–10.5)
nRBC: 0 % (ref 0.0–0.2)

## 2024-02-10 LAB — BASIC METABOLIC PANEL WITH GFR
Anion gap: 13 (ref 5–15)
BUN: 45 mg/dL — ABNORMAL HIGH (ref 8–23)
CO2: 23 mmol/L (ref 22–32)
Calcium: 8.5 mg/dL — ABNORMAL LOW (ref 8.9–10.3)
Chloride: 105 mmol/L (ref 98–111)
Creatinine, Ser: 4.02 mg/dL — ABNORMAL HIGH (ref 0.44–1.00)
GFR, Estimated: 10 mL/min — ABNORMAL LOW (ref >60)
Glucose, Bld: 161 mg/dL — ABNORMAL HIGH (ref 70–99)
Potassium: 3.9 mmol/L (ref 3.5–5.1)
Sodium: 141 mmol/L (ref 135–145)

## 2024-02-10 LAB — BRAIN NATRIURETIC PEPTIDE: B Natriuretic Peptide: 625 pg/mL — ABNORMAL HIGH (ref 0.0–100.0)

## 2024-02-10 LAB — TROPONIN I (HIGH SENSITIVITY): Troponin I (High Sensitivity): 20 ng/L — ABNORMAL HIGH (ref <18)

## 2024-02-10 MED ORDER — AMIODARONE HCL 200 MG PO TABS: 200.0000 mg | ORAL_TABLET | Freq: Two times a day (BID) | ORAL | 0 refills | Status: DC

## 2024-02-10 MED ORDER — AMIODARONE HCL 200 MG PO TABS
200.0000 mg | ORAL_TABLET | Freq: Every day | ORAL | Status: DC
  Administered 2024-02-10: 200 mg via ORAL
  Filled 2024-02-10: qty 1

## 2024-02-10 NOTE — Discharge Instructions (Signed)
 Your testing today was reassuring, I did discuss your care with the pharmacist and with the cardiologist, they recommend that you start taking amiodarone twice a day for the next 10 days, then take it once a day after that.  This is not cleared by your kidneys so you do not need to worry about when you take the medication with regards to dialysis.  Just take it every 12 hours.  Emergency department for severe worsening symptoms otherwise follow-up with the clinic that I referred you to yesterday.

## 2024-02-10 NOTE — ED Provider Notes (Signed)
 [place] EMERGENCY DEPARTMENT AT [facility] Provider Note   CSN: [id number] Arrival date & time: 02/10/24  1320     Patient presents with: Atrial Fibrillation   Lindsay Cortez is a 83 y.o. female.    Atrial Fibrillation   This patient is a very pleasant 83 year old female who is on dialysis for end-stage renal disease and uses a dialysis catheter in the right upper chest.  She has Monday Wednesday and Friday.  She takes Eliquis  twice a day, she is also on metoprolol  and has a known history of atrial fibrillation.  The patient had been admitted to the hospital in May with new onset atrial fibrillation with a rapid rate, she failed cardioversion but was successfully treated inpatient with Cardizem  and after being in the hospital for couple of days converted to sinus rhythm and was discharged.  She has remained compliant with her Eliquis .  Yesterday she had presented to the hospital very ill-appearing hypotensive and severely tachycardic and required emergent cardioversion under the medication of ketamine.  This was successful and she was able to be discharged after staying in sinus rhythm for a couple of hours afterwards.  She presents back today stating that while she was in the kitchen this morning she felt another acute onset of palpitations racing heartbeat shortness of breath and weakness.  Paramedics found the patient to be tachycardic in the 180 range in atrial fibrillation.  Paramedics gave the patient 12 mg of Cardizem  and then started a Cardizem  drip on the way to the hospital and on arrival the patient converted to sinus rhythm spontaneously.  At this time the patient states she feels much better but had been feeling near syncopal after this started this morning.  She denies fevers vomiting or diarrhea    Prior to Admission medications   Medication Sig Start Date End Date Taking? Authorizing Provider  amiodarone (PACERONE) 200 MG tablet Take 1 tablet (200 mg total) by  mouth 2 (two) times daily. 02/10/24  Yes Early Glisson, MD  allopurinol  (ZYLOPRIM ) 100 MG tablet Take 200 mg by mouth in the morning.    [provider]  apixaban  (ELIQUIS ) 2.5 MG TABS tablet Take 1 tablet (2.5 mg total) by mouth 2 (two) times daily. 01/15/24   Colin Dawley, MD  BREO ELLIPTA  100-25 MCG/ACT AEPB Inhale 1 puff into the lungs daily. 05/29/23   [provider]  Calcium Carb-Cholecalciferol  (CALCIUM 600 + D PO) Take 1 tablet by mouth every evening.    [provider]  cephALEXin  (KEFLEX ) 250 MG capsule Take 250 mg by mouth at bedtime. 12/29/23   [provider]  Cholecalciferol  (VITAMIN D ) 2000 UNITS CAPS Take 2,000 Units by mouth every evening.    [provider]  ELIQUIS  5 MG TABS tablet Take 5 mg by mouth 2 (two) times daily. 01/15/24   [provider]  furosemide  (LASIX ) 20 MG tablet Take 20 mg by mouth daily as needed for fluid or edema. 03/25/21   [provider]  levothyroxine  (SYNTHROID ) 88 MCG tablet Take 1 tablet (88 mcg total) by mouth daily before breakfast. 01/16/24   Emokpae, Courage, MD  loperamide  (IMODIUM ) 2 MG capsule Take 2 mg by mouth in the morning.    [provider]  metoprolol  succinate (TOPROL -XL) 50 MG 24 hr tablet Take 50 mg by mouth in the morning. 03/10/19   [provider]  simvastatin  (ZOCOR ) 10 MG tablet Take 10 mg by mouth at bedtime.    [provider]  TRADJENTA 5 MG TABS tablet Take 5 mg by mouth daily. 03/05/23   [provider]  zinc sulfate 220 (50 Zn) MG capsule Take 220 mg by mouth in the morning.    [provider]    Allergies: Codeine, Fentanyl , Levaquin  [levofloxacin  in d5w], and Levofloxacin     Review of Systems  All other systems reviewed and are negative.   Updated Vital Signs BP (!) 153/52   Pulse 69   Temp 97.8 F (36.6 C) (Oral)   Resp 15   Ht 1.499 m (4' 11)   Wt 76.7 kg   SpO2 93%   BMI 34.16 kg/m   Physical  Exam Vitals and nursing note reviewed.  Constitutional:      General: She is not in acute distress.    Appearance: She is well-developed.  HENT:     Head: Normocephalic and atraumatic.     Mouth/Throat:     Pharynx: No oropharyngeal exudate.   Eyes:     General: No scleral icterus.       Right eye: No discharge.        Left eye: No discharge.     Conjunctiva/sclera: Conjunctivae normal.     Pupils: Pupils are equal, round, and reactive to light.   Neck:     Thyroid : No thyromegaly.     Vascular: No JVD.   Cardiovascular:     Rate and Rhythm: Normal rate and regular rhythm.     Heart sounds: Normal heart sounds. No murmur heard.    No friction rub. No gallop.     Comments: Dialysis catheter present in the right upper chest wall Pulmonary:     Effort: Pulmonary effort is normal. No respiratory distress.     Breath sounds: Normal breath sounds. No wheezing or rales.  Abdominal:     General: Bowel sounds are normal. There is no distension.     Palpations: Abdomen is soft. There is no mass.     Tenderness: There is no abdominal tenderness.   Musculoskeletal:        General: No tenderness. Normal range of motion.     Cervical back: Normal range of motion and neck supple.     Right lower leg: No edema.     Left lower leg: No edema.  Lymphadenopathy:     Cervical: No cervical adenopathy.   Skin:    General: Skin is warm and dry.     Findings: No erythema or rash.   Neurological:     Mental Status: She is alert.     Coordination: Coordination normal.   Psychiatric:        Behavior: Behavior normal.     (all labs ordered are listed, but only abnormal results are displayed) Labs Reviewed  CBC - Abnormal; Notable for the following components:      Result Value   RBC 3.14 (*)    Hemoglobin 10.1 (*)    HCT 31.3 (*)    All other components within normal limits  BASIC METABOLIC PANEL WITH GFR - Abnormal; Notable for the following components:   Glucose, Bld 161 (*)     BUN 45 (*)    Creatinine, Ser 4.02 (*)    Calcium 8.5 (*)    GFR, Estimated 10 (*)    All other components within normal limits  BRAIN NATRIURETIC PEPTIDE - Abnormal; Notable for the following components:   B Natriuretic Peptide 625.0 (*)    All other components within normal limits  TROPONIN I (HIGH SENSITIVITY) - Abnormal; Notable for the following components:   Troponin I (High Sensitivity) 20 (*)    All other components within normal limits    EKG: EKG Interpretation Date/Time:  Sunday February 10 2024 13:30:25 EDT Ventricular Rate:  80 PR Interval:  168 QRS Duration:  143 QT Interval:  432 QTC Calculation: 499 R Axis:   132  Text Interpretation: Sinus rhythm RBBB and LPFB Confirmed by Early Glisson ([id number]) on 02/10/2024 1:53:46 PM  Radiology: [facility] Query Chest Port 1 View Result Date: 02/10/2024 CLINICAL DATA:  Atrial fibrillation EXAM: PORTABLE CHEST 1 VIEW COMPARISON:  02/09/2024 FINDINGS: Single frontal view of the chest demonstrates stable right internal jugular dialysis catheter. The cardiac silhouette is enlarged but stable. No acute airspace disease, effusion, or pneumothorax. Stable degenerative changes of the bilateral shoulders. IMPRESSION: 1. Stable chest, no acute process. Electronically Signed   By: Bobbye Burrow M.D.   On: 02/10/2024 14:10   DG Chest Port 1 View Result Date: 02/09/2024 CLINICAL DATA:  Palpitations, presyncope, tachycardia EXAM: PORTABLE CHEST 1 VIEW COMPARISON:  01/13/2024 FINDINGS: Single frontal view of the chest demonstrates stable right internal jugular dialysis catheter. External defibrillator pads are seen on the chest. Cardiac silhouette remains enlarged. No acute airspace disease, effusion, or pneumothorax. No acute bony abnormalities. IMPRESSION: 1. Stable enlarged cardiac silhouette. 2. No acute airspace disease. Electronically Signed   By: Bobbye Burrow M.D.   On: 02/09/2024 14:37     Procedures   Medications Ordered in the ED  amiodarone  (PACERONE) tablet 200 mg (has no administration in time range)                                    Medical Decision Making Amount and/or Complexity of Data Reviewed Labs: ordered. Radiology: ordered.    This patient presents to the ED for concern of recurrent atrial fibrillation, this involves an extensive number of treatment options, and is a complaint that carries with it a high risk of complications and morbidity.  The differential diagnosis includes current A-fib, worsening ejection fraction, fluid overload, hyperkalemia   Co morbidities / Chronic conditions that complicate the patient evaluation  Known atrial fibrillation recently diagnosed, known end-stage renal disease   Additional history obtained:  Additional history obtained from EMR External records from outside source obtained and reviewed including prior treatment in the hospital with echocardiogram as well   Lab Tests:  I Ordered, and personally interpreted labs.  The pertinent results include: CBC shows no leukocytosis, no significant anemia, no changes from yesterday.  Metabolic panel shows a creatinine of 4 but a potassium of 3.9, troponin is slightly elevated at 20, overall unremarkable given that she is on dialysis and BNP was 625.   Imaging Studies ordered:  I ordered imaging studies including no acute process on chest x-ray I independently visualized and interpreted imaging which showed no infiltrates or effusion I agree with the radiologist interpretation   Cardiac Monitoring: / EKG:  The patient was maintained on a cardiac monitor.  I personally viewed and interpreted the cardiac monitored which showed an underlying rhythm of: Normal sinus rhythm   Problem List / ED Course / Critical interventions / Medication management  Ultimately this patient has remained in normal sinus rhythm, she had no signs of tacky arrhythmias here in the ED and is felt totally normal.  She is normotensive without fever and  without shortness of breath.  I discussed her care with the cardiologist on-call, Dr. Harvie Liner, he recommends starting amiodarone, she was given the first dose in the emergency department, I discussed with the pharmacist on-call who states that this is not cleared renally so she can take it twice a day without regards to her dialysis timing I ordered medication including amiodarone 200 mg Reevaluation of the patient after these medicines showed that the patient remained stable I have reviewed the patients home medicines and have made adjustments as needed   Consultations Obtained:  I requested consultation with the cardiologist and pharmacist,  and discussed lab and imaging findings as well as pertinent plan - they recommend: Amiodarone twice a day   Social Determinants of Health:  New onset A-fib in the last month, known end-stage renal disease   Test / Admission - Considered:  I discussed being admitted to the hospital with the patient versus outpatient therapy and she request to go outpatient.  She has a family member here who will stay with her tonight.  The patient is otherwise medically stable appearing for discharge.  She understands the indications for return      Final diagnoses:  Paroxysmal atrial fibrillation [facility])    ED Discharge Orders          Ordered    amiodarone (PACERONE) 200 MG tablet  2 times daily       Note to Pharmacy: 200mg  PO Bid X 10 days, then q daily ongoing   02/10/24 1528               Early Glisson, MD 02/10/24 [phone]

## 2024-02-10 NOTE — ED Notes (Signed)
 EDP at bedside during triage

## 2024-02-10 NOTE — ED Triage Notes (Signed)
 Pt arrived [facility] from home with heart pounding and called EMS. Pt dialysis M/W/F.

## 2024-02-10 NOTE — ED Notes (Signed)
 See triage notes. Per EMS pt hr 180, gave 12mg  push cardizem  then drip. Pt did not have drip going when arrived and hr in 80s. Denies any symptoms. Pt stable.

## 2024-02-14 DIAGNOSIS — E119 Type 2 diabetes mellitus without complications: Secondary | ICD-10-CM | POA: Diagnosis not present

## 2024-02-19 ENCOUNTER — Ambulatory Visit (HOSPITAL_COMMUNITY)
Admission: RE | Admit: 2024-02-19 | Discharge: 2024-02-19 | Disposition: A | Discharge status: Home / Self Care | Source: Ambulatory Visit | Attending: Internal Medicine | Admitting: Internal Medicine

## 2024-02-19 VITALS — BP 190/62 | HR 59 | Ht 59.0 in | Wt 104.6 lb

## 2024-02-19 DIAGNOSIS — Z5181 Encounter for therapeutic drug level monitoring: Secondary | ICD-10-CM | POA: Diagnosis not present

## 2024-02-19 DIAGNOSIS — I480 Paroxysmal atrial fibrillation: Secondary | ICD-10-CM

## 2024-02-19 DIAGNOSIS — Z79899 Other long term (current) drug therapy: Secondary | ICD-10-CM | POA: Diagnosis not present

## 2024-02-19 DIAGNOSIS — D6869 Other thrombophilia: Secondary | ICD-10-CM | POA: Diagnosis not present

## 2024-02-19 DIAGNOSIS — I4891 Unspecified atrial fibrillation: Secondary | ICD-10-CM

## 2024-02-19 MED ORDER — AMIODARONE HCL 200 MG PO TABS: ORAL_TABLET | ORAL | 2 refills | Status: AC

## 2024-02-19 MED ORDER — APIXABAN 2.5 MG PO TABS: 2.5000 mg | ORAL_TABLET | Freq: Two times a day (BID) | ORAL | 5 refills | Status: DC

## 2024-02-19 NOTE — Patient Instructions (Signed)
 Continue Amiodarone  200mg  twice a day through July 14th -- then reduce to 200mg  once a day   Call with update of your Eliquis  dose [phone] option 2

## 2024-02-19 NOTE — Addendum Note (Signed)
 Encounter addended by: Terra Fairy PARAS, PA-C on: 02/19/2024 1:26 PM  Actions taken: Clinical Note Signed

## 2024-02-19 NOTE — Progress Notes (Addendum)
 Primary Care Physician: Bertell Satterfield, MD Primary Cardiologist: Alvan Carrier, MD Electrophysiologist: None     Referring Physician: Dr. Cindie Dickey Lindsay Cortez is a 83 y.o. female with a history of COPD, T2DM, hypothyroidism, ESRD on dialysis, HTN, and atrial fibrillation who presents for consultation in the [facility]. Hospital admission 12/2023 for new onset Afib. ED visits on 6/14 and 6/15 for Afib; s/p DCCV on 6/14. ED visit on 6/15 led to phone consultation with Dr. Cindie and patient started on amiodarone  200 mg BID. Patient is on Eliquis  2.5 mg BID for a CHADS2VASC score of 5.  On evaluation today, she is currently in NSR. She is currently taking amiodarone  200 mg BID (taking medication at 10 am and 10 pm). She is on Toprol  50 mg daily. She is not entirely sure if taking Eliquis  5 mg BID or 2.5 mg BID. Sister is patient today and not sure either.   Today, she denies symptoms of palpitations, chest pain, shortness of breath, orthopnea, PND, lower extremity edema, dizziness, presyncope, syncope, snoring, daytime somnolence, bleeding, or neurologic sequela. The patient is tolerating medications without difficulties and is otherwise without complaint today.    she has a BMI of Body mass index is 21.13 kg/m.[name] Filed Weights   02/19/24 1121  Weight: 47.4 kg    Current Outpatient Medications  Medication Sig Dispense Refill   allopurinol  (ZYLOPRIM ) 100 MG tablet Take 200 mg by mouth in the morning.     apixaban  (ELIQUIS ) 2.5 MG TABS tablet Take 1 tablet (2.5 mg total) by mouth 2 (two) times daily. 60 tablet 5   BREO ELLIPTA  100-25 MCG/ACT AEPB Inhale 1 puff into the lungs daily.     Calcium Carb-Cholecalciferol  (CALCIUM 600 + D PO) Take 1 tablet by mouth every evening.     cephALEXin  (KEFLEX ) 250 MG capsule Take 250 mg by mouth at bedtime.     Cholecalciferol  (VITAMIN D ) 2000 UNITS CAPS Take 2,000 Units by mouth every evening.     furosemide   (LASIX ) 20 MG tablet Take 20 mg by mouth daily as needed for fluid or edema. (Patient taking differently: Take 20 mg by mouth as needed for fluid or edema.)     levothyroxine  (SYNTHROID ) 88 MCG tablet Take 1 tablet (88 mcg total) by mouth daily before breakfast. 30 tablet 3   loperamide  (IMODIUM ) 2 MG capsule Take 2 mg by mouth in the morning.     metoprolol  succinate (TOPROL -XL) 50 MG 24 hr tablet Take 50 mg by mouth in the morning.     simvastatin  (ZOCOR ) 10 MG tablet Take 10 mg by mouth at bedtime.     TRADJENTA 5 MG TABS tablet Take 5 mg by mouth daily.     zinc sulfate 220 (50 Zn) MG capsule Take 220 mg by mouth in the morning.     amiodarone  (PACERONE ) 200 MG tablet Take 1 tablet (200 mg total) by mouth 2 (two) times daily for 30 days, THEN 1 tablet (200 mg total) daily. 90 tablet 2   ELIQUIS  5 MG TABS tablet Take 5 mg by mouth 2 (two) times daily.     No current facility-administered medications for this encounter.    Atrial Fibrillation Management history:  Previous antiarrhythmic drugs: amiodarone  Previous cardioversions: 02/09/24 (ED) Previous ablations: none Anticoagulation history: Eliquis  2.5 mg BID   ROS- All systems are reviewed and negative except as per the HPI above.  Physical Exam: BP (!) 190/62  Pulse (!) 59   Ht 4' 11 (1.499 m)   Wt 47.4 kg   BMI 21.13 kg/m   GEN: Well nourished, well developed in no acute distress NECK: No JVD; No carotid bruits CARDIAC: Regular rate and rhythm, no murmurs, rubs, gallops RESPIRATORY:  Clear to auscultation without rales, wheezing or rhonchi  ABDOMEN: Soft, non-tender, non-distended EXTREMITIES:  No edema; No deformity   EKG today demonstrates  Vent. rate 59 BPM PR interval 190 ms QRS duration 134 ms QT/QTcB 516/510 ms P-R-T axes 71 102 48 Sinus bradycardia Right bundle branch block Abnormal ECG When compared with ECG of 10-Feb-2024 13:30, PREVIOUS ECG IS PRESENT  Echo 01/14/24 demonstrated   1. Left ventricular  ejection fraction, by estimation, is 60 to 65%. The  left ventricle has normal function. The left ventricle has no regional  wall motion abnormalities. There is mild left ventricular hypertrophy.  Left ventricular diastolic parameters  are indeterminate. Elevated left atrial pressure.   2. Right ventricular systolic function is low normal. The right  ventricular size is normal. There is mildly elevated pulmonary artery  systolic pressure.   3. Left atrial size was severely dilated.   4. Right atrial size was mildly dilated.   5. The mitral valve is normal in structure. Trivial mitral valve  regurgitation. No evidence of mitral stenosis.   6. The aortic valve was not well visualized. There is mild calcification  of the aortic valve. There is mild thickening of the aortic valve. Aortic  valve regurgitation is not visualized. No aortic stenosis is present.   7. The inferior vena cava is dilated in size with <50% respiratory  variability, suggesting right atrial pressure of 15 mmHg.   ASSESSMENT & PLAN CHA2DS2-VASc Score = 5  The patient's score is based upon: CHF History: 0 HTN History: 1 Diabetes History: 1 Stroke History: 0 Vascular Disease History: 0 Age Score: 2 Gender Score: 1       ASSESSMENT AND PLAN: Paroxysmal Atrial Fibrillation (ICD10:  I48.0) The patient's CHA2DS2-VASc score is 5, indicating a 7.2% annual risk of stroke.    She is currently in NSR. Continue Toprol  50 mg daily.  High risk medication monitoring (ICD10: [id number]) Patient requires ongoing monitoring for anti-arrhythmic medication which has the potential to cause life threatening arrhythmias or AV block. Qtc stable. Continue amiodarone  200 mg BID x 3 weeks then transition to 200 mg once daily.   Secondary Hypercoagulable State (ICD10:  D68.69) The patient is at significant risk for stroke/thromboembolism based upon her CHA2DS2-VASc Score of 5.  Continue Apixaban  (Eliquis ).   Continue Eliquis  2.5 mg  BID without interruption. Dosage is appropriate due to age and ESRD. Patient will call clinic later today to confirm dosage.  Addendum 02/19/24 at 1326: Patient called clinic and confirmed she is taking Eliquis  2.5 mg BID.     Follow up as scheduled with Dr. Alvan. Follow up Afib clinic in 6 months amiodarone  surveillance.   Lindsay Pac, PA-C  [facility] Clinic [facility] [address] [place], [place] [address] [phone]

## 2024-02-19 NOTE — Addendum Note (Signed)
 Encounter addended by: Franchot Glade RAMAN, RN on: 02/19/2024 1:26 PM  Actions taken: Order list changed

## 2024-02-26 DIAGNOSIS — N186 End stage renal disease: Secondary | ICD-10-CM | POA: Diagnosis not present

## 2024-02-26 DIAGNOSIS — Z992 Dependence on renal dialysis: Secondary | ICD-10-CM | POA: Diagnosis not present

## 2024-02-27 DIAGNOSIS — D509 Iron deficiency anemia, unspecified: Secondary | ICD-10-CM | POA: Diagnosis not present

## 2024-02-27 DIAGNOSIS — Z992 Dependence on renal dialysis: Secondary | ICD-10-CM | POA: Diagnosis not present

## 2024-02-27 DIAGNOSIS — D631 Anemia in chronic kidney disease: Secondary | ICD-10-CM | POA: Diagnosis not present

## 2024-02-27 DIAGNOSIS — N186 End stage renal disease: Secondary | ICD-10-CM | POA: Diagnosis not present

## 2024-02-28 DIAGNOSIS — E039 Hypothyroidism, unspecified: Secondary | ICD-10-CM | POA: Diagnosis not present

## 2024-02-28 DIAGNOSIS — E1129 Type 2 diabetes mellitus with other diabetic kidney complication: Secondary | ICD-10-CM | POA: Diagnosis not present

## 2024-02-28 DIAGNOSIS — J449 Chronic obstructive pulmonary disease, unspecified: Secondary | ICD-10-CM | POA: Diagnosis not present

## 2024-02-28 DIAGNOSIS — E1165 Type 2 diabetes mellitus with hyperglycemia: Secondary | ICD-10-CM | POA: Diagnosis not present

## 2024-02-28 DIAGNOSIS — N186 End stage renal disease: Secondary | ICD-10-CM | POA: Diagnosis not present

## 2024-02-28 DIAGNOSIS — Z6821 Body mass index (BMI) 21.0-21.9, adult: Secondary | ICD-10-CM | POA: Diagnosis not present

## 2024-02-28 DIAGNOSIS — I4891 Unspecified atrial fibrillation: Secondary | ICD-10-CM | POA: Diagnosis not present

## 2024-02-28 DIAGNOSIS — E1140 Type 2 diabetes mellitus with diabetic neuropathy, unspecified: Secondary | ICD-10-CM | POA: Diagnosis not present

## 2024-02-28 DIAGNOSIS — I10 Essential (primary) hypertension: Secondary | ICD-10-CM | POA: Diagnosis not present

## 2024-02-28 DIAGNOSIS — I120 Hypertensive chronic kidney disease with stage 5 chronic kidney disease or end stage renal disease: Secondary | ICD-10-CM | POA: Diagnosis not present

## 2024-02-28 DIAGNOSIS — E1122 Type 2 diabetes mellitus with diabetic chronic kidney disease: Secondary | ICD-10-CM | POA: Diagnosis not present

## 2024-03-03 ENCOUNTER — Other Ambulatory Visit (HOSPITAL_COMMUNITY): Payer: Self-pay | Admitting: Nurse Practitioner

## 2024-03-03 ENCOUNTER — Ambulatory Visit (HOSPITAL_COMMUNITY)
Admission: RE | Admit: 2024-03-03 | Discharge: 2024-03-03 | Disposition: A | Discharge status: Home / Self Care | Source: Ambulatory Visit | Attending: Nurse Practitioner | Admitting: Nurse Practitioner

## 2024-03-03 DIAGNOSIS — R059 Cough, unspecified: Secondary | ICD-10-CM | POA: Diagnosis not present

## 2024-03-03 DIAGNOSIS — J189 Pneumonia, unspecified organism: Secondary | ICD-10-CM

## 2024-03-03 DIAGNOSIS — R0602 Shortness of breath: Secondary | ICD-10-CM | POA: Diagnosis not present

## 2024-03-03 DIAGNOSIS — I700 Atherosclerosis of aorta: Secondary | ICD-10-CM | POA: Diagnosis not present

## 2024-03-03 DIAGNOSIS — J90 Pleural effusion, not elsewhere classified: Secondary | ICD-10-CM | POA: Diagnosis not present

## 2024-03-04 DIAGNOSIS — E890 Postprocedural hypothyroidism: Secondary | ICD-10-CM | POA: Diagnosis not present

## 2024-03-04 DIAGNOSIS — Z808 Family history of malignant neoplasm of other organs or systems: Secondary | ICD-10-CM | POA: Diagnosis not present

## 2024-03-04 DIAGNOSIS — I10 Essential (primary) hypertension: Secondary | ICD-10-CM | POA: Diagnosis not present

## 2024-03-04 DIAGNOSIS — E1165 Type 2 diabetes mellitus with hyperglycemia: Secondary | ICD-10-CM | POA: Diagnosis not present

## 2024-03-04 DIAGNOSIS — E7800 Pure hypercholesterolemia, unspecified: Secondary | ICD-10-CM | POA: Diagnosis not present

## 2024-03-28 DIAGNOSIS — N186 End stage renal disease: Secondary | ICD-10-CM | POA: Diagnosis not present

## 2024-03-28 DIAGNOSIS — D631 Anemia in chronic kidney disease: Secondary | ICD-10-CM | POA: Diagnosis not present

## 2024-03-28 DIAGNOSIS — D509 Iron deficiency anemia, unspecified: Secondary | ICD-10-CM | POA: Diagnosis not present

## 2024-03-28 DIAGNOSIS — Z992 Dependence on renal dialysis: Secondary | ICD-10-CM | POA: Diagnosis not present

## 2024-04-15 ENCOUNTER — Encounter: Payer: Self-pay | Admitting: Cardiology

## 2024-04-15 ENCOUNTER — Ambulatory Visit: Attending: Cardiology | Admitting: Cardiology

## 2024-04-15 VITALS — BP 122/48 | HR 53 | Ht 59.0 in | Wt 109.0 lb

## 2024-04-15 DIAGNOSIS — Z79899 Other long term (current) drug therapy: Secondary | ICD-10-CM | POA: Diagnosis not present

## 2024-04-15 DIAGNOSIS — I4891 Unspecified atrial fibrillation: Secondary | ICD-10-CM

## 2024-04-15 DIAGNOSIS — I120 Hypertensive chronic kidney disease with stage 5 chronic kidney disease or end stage renal disease: Secondary | ICD-10-CM | POA: Diagnosis not present

## 2024-04-15 DIAGNOSIS — D631 Anemia in chronic kidney disease: Secondary | ICD-10-CM | POA: Diagnosis not present

## 2024-04-15 DIAGNOSIS — D6869 Other thrombophilia: Secondary | ICD-10-CM | POA: Diagnosis not present

## 2024-04-15 DIAGNOSIS — E1122 Type 2 diabetes mellitus with diabetic chronic kidney disease: Secondary | ICD-10-CM | POA: Diagnosis not present

## 2024-04-15 MED ORDER — METOPROLOL SUCCINATE ER 25 MG PO TB24: 25.0000 mg | ORAL_TABLET | Freq: Every day | ORAL | 6 refills | Status: DC

## 2024-04-15 NOTE — Progress Notes (Signed)
 Clinical Summary Lindsay Cortez is a 83 y.o.female last seen by afib clinic, this is our first visit together. Seen today for the following medical problems.   1.Afib - new diagnosis during 12/2023 admission. During that admission failed DCCV x 2 in ER. Later converted on her own with IV cardizem .  - DCCV 02/09/24 - on oral amiodarone  per EP. Also followed in afib clinic - EKG today shows sinus brady with first degree av block, RBBB  - taking amiodarone  200mg  - no recent palpitations - no bleeding on eliquis .  - some generalized fatigue at times, no specific lightheadedness or dizziness.       2. ESRD Past Medical History:  Diagnosis Date   Afib (HCC)    Arthritis    Asthma    Back pain    COPD (chronic obstructive pulmonary disease) (HCC)    Diabetes (HCC) 09/01/2014   Diabetes mellitus    x 15 yrs   Gout    Hypertension    Kidney stones    Pancreatic cyst    Pneumonia    Renal disorder    Renal insufficiency    Shoulder dislocation    Vertigo      Allergies  Allergen Reactions   Codeine Nausea And Vomiting    Narcotic pain medicine makes her nauseated.  Says was told to only take tylenol  due to kidney disease.   Fentanyl  Nausea And Vomiting   Levaquin  [Levofloxacin  In D5w] Nausea And Vomiting   Levofloxacin  Nausea And Vomiting     Current Outpatient Medications  Medication Sig Dispense Refill   allopurinol  (ZYLOPRIM ) 100 MG tablet Take 200 mg by mouth in the morning.     amiodarone  (PACERONE ) 200 MG tablet Take 1 tablet (200 mg total) by mouth 2 (two) times daily for 30 days, THEN 1 tablet (200 mg total) daily. 90 tablet 2   apixaban  (ELIQUIS ) 2.5 MG TABS tablet Take 1 tablet (2.5 mg total) by mouth 2 (two) times daily. 60 tablet 5   BREO ELLIPTA  100-25 MCG/ACT AEPB Inhale 1 puff into the lungs daily.     Calcium Carb-Cholecalciferol  (CALCIUM 600 + D PO) Take 1 tablet by mouth every evening.     cephALEXin  (KEFLEX ) 250 MG capsule Take 250 mg by mouth at  bedtime.     Cholecalciferol  (VITAMIN D ) 2000 UNITS CAPS Take 2,000 Units by mouth every evening.     furosemide  (LASIX ) 20 MG tablet Take 20 mg by mouth daily as needed for fluid or edema. (Patient taking differently: Take 20 mg by mouth as needed for fluid or edema.)     levothyroxine  (SYNTHROID ) 88 MCG tablet Take 1 tablet (88 mcg total) by mouth daily before breakfast. 30 tablet 3   loperamide  (IMODIUM ) 2 MG capsule Take 2 mg by mouth in the morning.     metoprolol  succinate (TOPROL -XL) 50 MG 24 hr tablet Take 50 mg by mouth in the morning.     simvastatin  (ZOCOR ) 10 MG tablet Take 10 mg by mouth at bedtime.     TRADJENTA 5 MG TABS tablet Take 5 mg by mouth daily.     zinc sulfate 220 (50 Zn) MG capsule Take 220 mg by mouth in the morning.     No current facility-administered medications for this visit.     Past Surgical History:  Procedure Laterality Date   ABDOMINAL HYSTERECTOMY     AV FISTULA PLACEMENT Left 11/21/2022   Procedure: LEFT ARM ARTERIOVENOUS (AV) FISTULA CREATION;  Surgeon: Oris Krystal FALCON, MD;  Location: [facility];  Service: Vascular;  Laterality: Left;   BLADDER SURGERY     bladder tac   BREAST SURGERY Left    benign   FOOT SURGERY Bilateral    bunions     Allergies  Allergen Reactions   Codeine Nausea And Vomiting    Narcotic pain medicine makes her nauseated.  Says was told to only take tylenol  due to kidney disease.   Fentanyl  Nausea And Vomiting   Levaquin  [Levofloxacin  In D5w] Nausea And Vomiting   Levofloxacin  Nausea And Vomiting      Family History  Problem Relation Age of Onset   Breast cancer Mother    Emphysema Father    COPD Sister    Atrial fibrillation Sister    Obesity Brother    Cirrhosis Daughter    COPD Son    Diabetes Brother    Asthma Brother      Social History Ms. Fallaw reports that she has never smoked. She has never used smokeless tobacco. Ms. Hatchell reports no history of alcohol use.    Physical Examination Today's  Vitals   04/15/24 1444  BP: (!) 122/48  Pulse: (!) 53  SpO2: 95%  Weight: 109 lb (49.4 kg)  Height: 4' 11 (1.499 m)   Body mass index is 22.02 kg/m.  Gen: resting comfortably, no acute distress HEENT: no scleral icterus, pupils equal round and reactive, no palptable cervical adenopathy,  CV: RRR, no m/rg, no jvd Resp: Clear to auscultation bilaterally GI: abdomen is soft, non-tender, non-distended, normal bowel sounds, no hepatosplenomegaly MSK: extremities are warm, no edema.  Skin: warm, no rash Neuro:  no focal deficits Psych: appropriate affect   Diagnostic Studies  12/2023 echo 1. Left ventricular ejection fraction, by estimation, is 60 to 65%. The  left ventricle has normal function. The left ventricle has no regional  wall motion abnormalities. There is mild left ventricular hypertrophy.  Left ventricular diastolic parameters  are indeterminate. Elevated left atrial pressure.   2. Right ventricular systolic function is low normal. The right  ventricular size is normal. There is mildly elevated pulmonary artery  systolic pressure.   3. Left atrial size was severely dilated.   4. Right atrial size was mildly dilated.   5. The mitral valve is normal in structure. Trivial mitral valve  regurgitation. No evidence of mitral stenosis.   6. The aortic valve was not well visualized. There is mild calcification  of the aortic valve. There is mild thickening of the aortic valve. Aortic  valve regurgitation is not visualized. No aortic stenosis is present.   7. The inferior vena cava is dilated in size with <50% respiratory  variability, suggesting right atrial pressure of 15 mmHg.      Assessment and Plan  1.Afib/acquired thrombophlia/high risk medication use - maintaining SR on amiodarone , no recent symptoms - some generalized fatigue. EKG shows sinus brady first degree av block, lower toprol  to 25mg  daily - continue eliquis  for stroke prevention - recent normal TSH  checked by endocrine, check with dialysis center if recent LFTs       Lindsay Cortez, M.D.

## 2024-04-15 NOTE — Patient Instructions (Signed)
 Medication Instructions:   Decrease Toprol  to 25mg  daily  Continue all other medications.     Labwork:  none  Testing/Procedures:  none  Follow-Up:  6 months   Any Other Special Instructions Will Be Listed Below (If Applicable).   If you need a refill on your cardiac medications before your next appointment, please call your pharmacy.

## 2024-04-16 ENCOUNTER — Encounter: Payer: Self-pay | Admitting: *Deleted

## 2024-04-17 ENCOUNTER — Encounter: Payer: Self-pay | Admitting: Nephrology

## 2024-04-28 DIAGNOSIS — D509 Iron deficiency anemia, unspecified: Secondary | ICD-10-CM | POA: Diagnosis not present

## 2024-04-28 DIAGNOSIS — Z992 Dependence on renal dialysis: Secondary | ICD-10-CM | POA: Diagnosis not present

## 2024-04-28 DIAGNOSIS — D631 Anemia in chronic kidney disease: Secondary | ICD-10-CM | POA: Diagnosis not present

## 2024-04-28 DIAGNOSIS — N186 End stage renal disease: Secondary | ICD-10-CM | POA: Diagnosis not present

## 2024-05-28 DIAGNOSIS — N186 End stage renal disease: Secondary | ICD-10-CM | POA: Diagnosis not present

## 2024-05-28 DIAGNOSIS — D509 Iron deficiency anemia, unspecified: Secondary | ICD-10-CM | POA: Diagnosis not present

## 2024-05-28 DIAGNOSIS — Z23 Encounter for immunization: Secondary | ICD-10-CM | POA: Diagnosis not present

## 2024-05-28 DIAGNOSIS — D631 Anemia in chronic kidney disease: Secondary | ICD-10-CM | POA: Diagnosis not present

## 2024-05-28 DIAGNOSIS — Z992 Dependence on renal dialysis: Secondary | ICD-10-CM | POA: Diagnosis not present

## 2024-06-24 ENCOUNTER — Encounter: Payer: Self-pay | Admitting: Emergency Medicine

## 2024-06-24 ENCOUNTER — Other Ambulatory Visit: Payer: Self-pay

## 2024-06-24 ENCOUNTER — Ambulatory Visit
Admission: EM | Admit: 2024-06-24 | Discharge: 2024-06-24 | Disposition: A | Discharge status: Home / Self Care | Attending: Family Medicine | Admitting: Family Medicine

## 2024-06-24 ENCOUNTER — Ambulatory Visit

## 2024-06-24 DIAGNOSIS — R030 Elevated blood-pressure reading, without diagnosis of hypertension: Secondary | ICD-10-CM

## 2024-06-24 DIAGNOSIS — R059 Cough, unspecified: Secondary | ICD-10-CM | POA: Diagnosis not present

## 2024-06-24 DIAGNOSIS — R051 Acute cough: Secondary | ICD-10-CM

## 2024-06-24 DIAGNOSIS — J441 Chronic obstructive pulmonary disease with (acute) exacerbation: Secondary | ICD-10-CM

## 2024-06-24 DIAGNOSIS — J90 Pleural effusion, not elsewhere classified: Secondary | ICD-10-CM | POA: Diagnosis not present

## 2024-06-24 DIAGNOSIS — J9811 Atelectasis: Secondary | ICD-10-CM | POA: Diagnosis not present

## 2024-06-24 DIAGNOSIS — J449 Chronic obstructive pulmonary disease, unspecified: Secondary | ICD-10-CM | POA: Diagnosis not present

## 2024-06-24 MED ORDER — PREDNISONE 20 MG PO TABS: 40.0000 mg | ORAL_TABLET | Freq: Every day | ORAL | 0 refills | Status: DC

## 2024-06-24 MED ORDER — ALBUTEROL SULFATE HFA 108 (90 BASE) MCG/ACT IN AERS: 2.0000 | INHALATION_SPRAY | RESPIRATORY_TRACT | 0 refills | Status: DC | PRN

## 2024-06-24 MED ORDER — AMOXICILLIN-POT CLAVULANATE 875-125 MG PO TABS: 1.0000 | ORAL_TABLET | Freq: Two times a day (BID) | ORAL | 0 refills | Status: DC

## 2024-06-24 MED ORDER — ALBUTEROL SULFATE (2.5 MG/3ML) 0.083% IN NEBU
2.5000 mg | INHALATION_SOLUTION | Freq: Once | RESPIRATORY_TRACT | Status: AC
  Administered 2024-06-24: 2.5 mg via RESPIRATORY_TRACT

## 2024-06-24 NOTE — ED Provider Notes (Signed)
 [facility]    CSN: [id number] Arrival date & time: 06/24/24  1422      History   Chief Complaint Chief Complaint  Patient presents with   Cough    HPI Lindsay Cortez is a 83 y.o. female.   Patient presenting today with 4 to 5-day history of productive cough, chest congestion, wheezing, chest tightness, shortness of breath, nasal congestion, sore throat.  Denies fever, chills, abdominal pain, vomiting, diarrhea.  So far try Nasonex  with minimal relief.  History of COPD, cannot find her albuterol  inhaler so has not been able to use it.    Past Medical History:  Diagnosis Date   Afib (HCC)    Arthritis    Asthma    Back pain    COPD (chronic obstructive pulmonary disease) (HCC)    Diabetes (HCC) 09/01/2014   Diabetes mellitus    x 15 yrs   Gout    Hypertension    Kidney stones    Pancreatic cyst    Pneumonia    Renal disorder    Renal insufficiency    Shoulder dislocation    Vertigo     Patient Active Problem List   Diagnosis Date Noted   Atrial fibrillation with RVR (HCC) 01/13/2024   ESRD on dialysis (HCC) 01/13/2024   Hypotension 06/12/2023   Hypokalemia 06/12/2023   Prolonged QT interval 06/12/2023   Near syncope 06/11/2023   Acute kidney failure with lesion of tubular necrosis 11/14/2022   Chronic kidney disease due to diabetes mellitus (HCC) 11/14/2022   Anemia of chronic disease 11/14/2022   Benign hypertensive kidney disease with chronic kidney disease 11/14/2022   CAP (community acquired pneumonia) 02/24/2021   Sepsis due to community-acquired pneumonia 02/24/2021   Hypoalbuminemia 02/24/2021   Hypothyroidism 02/24/2021   Hyperglycemia due to diabetes mellitus (HCC) 02/24/2021   Essential hypertension 02/24/2021   Leukocytosis 07/11/2019   Acute respiratory failure with hypoxia (HCC) 09/02/2014   Chronic kidney disease, stage 3 unspecified (HCC) 09/02/2014   Nausea and vomiting 09/01/2014   COPD (chronic obstructive pulmonary  disease) (HCC) 09/01/2014   Dehydration 09/01/2014   Hypoxia 09/01/2014   Diabetes mellitus (HCC) 09/01/2014   Acute exacerbation of chronic obstructive airways disease with asthma (HCC) 09/01/2014   Pancreatic mass 05/19/2014   Common bile duct calculus 05/19/2014   Right knee pain 09/18/2013   Lateral meniscus derangement 09/18/2013   Arthritis of right knee 09/18/2013    Past Surgical History:  Procedure Laterality Date   ABDOMINAL HYSTERECTOMY     AV FISTULA PLACEMENT Left 11/21/2022   Procedure: LEFT ARM ARTERIOVENOUS (AV) FISTULA CREATION;  Surgeon: Oris Krystal FALCON, MD;  Location: [facility];  Service: Vascular;  Laterality: Left;   BLADDER SURGERY     bladder tac   BREAST SURGERY Left    benign   FOOT SURGERY Bilateral    bunions    OB History     Gravida  2   Para  2   Term  2   Preterm      AB      Living  2      SAB      IAB      Ectopic      Multiple      Live Births               Home Medications    Prior to Admission medications   Medication Sig Start Date End Date Taking? Authorizing Provider  albuterol  (VENTOLIN  HFA)  108 (90 Base) MCG/ACT inhaler Inhale 2 puffs into the lungs every 4 (four) hours as needed. 06/24/24  Yes Stuart Vernell Norris, PA-C  amoxicillin-clavulanate (AUGMENTIN) 875-125 MG tablet Take 1 tablet by mouth every 12 (twelve) hours. 06/24/24  Yes Stuart Vernell Norris, PA-C  predniSONE  (DELTASONE ) 20 MG tablet Take 2 tablets (40 mg total) by mouth daily with breakfast. 06/24/24  Yes Stuart Vernell Norris, PA-C  allopurinol  (ZYLOPRIM ) 100 MG tablet Take 200 mg by mouth in the morning.    [provider]  amiodarone  (PACERONE ) 200 MG tablet Take 1 tablet (200 mg total) by mouth 2 (two) times daily for 30 days, THEN 1 tablet (200 mg total) daily. 02/10/23 04/15/24  Terra Fairy PARAS, PA-C  apixaban  (ELIQUIS ) 2.5 MG TABS tablet Take 1 tablet (2.5 mg total) by mouth 2 (two) times daily. 02/19/24   Terra Fairy PARAS, PA-C   BREO ELLIPTA  100-25 MCG/ACT AEPB Inhale 1 puff into the lungs daily. 05/29/23   [provider]  Calcium Carb-Cholecalciferol  (CALCIUM 600 + D PO) Take 1 tablet by mouth every evening.    [provider]  cephALEXin  (KEFLEX ) 250 MG capsule Take 250 mg by mouth at bedtime. 12/29/23   [provider]  Cholecalciferol  (VITAMIN D ) 2000 UNITS CAPS Take 2,000 Units by mouth every evening.    [provider]  furosemide  (LASIX ) 20 MG tablet Take 20 mg by mouth daily as needed for fluid or edema. 03/25/21   [provider]  levothyroxine  (SYNTHROID ) 88 MCG tablet Take 1 tablet (88 mcg total) by mouth daily before breakfast. 01/16/24   Emokpae, Courage, MD  loperamide  (IMODIUM ) 2 MG capsule Take 2 mg by mouth in the morning.    [provider]  metoprolol  succinate (TOPROL -XL) 25 MG 24 hr tablet Take 1 tablet (25 mg total) by mouth daily. 04/15/24   Alvan Dorn FALCON, MD  simvastatin  (ZOCOR ) 10 MG tablet Take 10 mg by mouth at bedtime.    [provider]  zinc sulfate 220 (50 Zn) MG capsule Take 220 mg by mouth in the morning.    [provider]    Family History Family History  Problem Relation Age of Onset   Breast cancer Mother    Emphysema Father    COPD Sister    Atrial fibrillation Sister    Obesity Brother    Cirrhosis Daughter    COPD Son    Diabetes Brother    Asthma Brother     Social History Social History   Tobacco Use   Smoking status: Never   Smokeless tobacco: Never  Vaping Use   Vaping status: Never Used  Substance Use Topics   Alcohol use: No    Alcohol/week: 0.0 standard drinks of alcohol   Drug use: No     Allergies   Codeine, Fentanyl , Levaquin  [levofloxacin  in d5w], and Levofloxacin    Review of Systems Review of Systems Per HPI  Physical Exam Triage Vital Signs ED Triage Vitals  Encounter Vitals Group     BP 06/24/24 1512 (!) 195/74     Girls Systolic BP Percentile --      Girls  Diastolic BP Percentile --      Boys Systolic BP Percentile --      Boys Diastolic BP Percentile --      Pulse Rate 06/24/24 1512 71     Resp 06/24/24 1512 20     Temp 06/24/24 1512 98.3 F (36.8 C)     Temp Source 06/24/24  1512 Oral     SpO2 06/24/24 1512 93 %     Weight --      Height --      Head Circumference --      Peak Flow --      Pain Score 06/24/24 1511 0     Pain Loc --      Pain Education --      Exclude from Growth Chart --    No data found.  Updated Vital Signs BP (!) 195/74 (BP Location: Right Arm)   Pulse 71   Temp 98.3 F (36.8 C) (Oral)   Resp 20   SpO2 93% Comment: at rest, post xray 92-93% on room air.  Visual Acuity Right Eye Distance:   Left Eye Distance:   Bilateral Distance:    Right Eye Near:   Left Eye Near:    Bilateral Near:     Physical Exam Vitals and nursing note reviewed.  Constitutional:      Appearance: Normal appearance.  HENT:     Head: Atraumatic.     Right Ear: Tympanic membrane and external ear normal.     Left Ear: Tympanic membrane and external ear normal.     Nose: Rhinorrhea present.     Mouth/Throat:     Mouth: Mucous membranes are moist.     Pharynx: Posterior oropharyngeal erythema present.  Eyes:     Extraocular Movements: Extraocular movements intact.     Conjunctiva/sclera: Conjunctivae normal.  Cardiovascular:     Rate and Rhythm: Normal rate.  Pulmonary:     Effort: Pulmonary effort is normal.     Breath sounds: Wheezing present. No rales.  Musculoskeletal:        General: Normal range of motion.     Cervical back: Normal range of motion and neck supple.  Skin:    General: Skin is warm and dry.  Neurological:     Mental Status: She is alert and oriented to person, place, and time.  Psychiatric:        Mood and Affect: Mood normal.        Thought Content: Thought content normal.      UC Treatments / Results  Labs (all labs ordered are listed, but only abnormal results are displayed) Labs  Reviewed - No data to display  EKG   Radiology DG Chest 2 View Result Date: 06/24/2024 EXAM: 2 VIEW(S) XRAY OF THE CHEST 06/24/2024 03:56:32 PM COMPARISON: 03/03/2024 CLINICAL HISTORY: hypoxia, cough, COPD hx. FINDINGS: LINES, TUBES AND DEVICES: Right internal jugular catheter is unchanged. LUNGS AND PLEURA: Minimal bibasilar subsegmental atelectasis is noted. Small pleural effusions. No focal pulmonary opacity. No pulmonary edema. No pneumothorax. HEART AND MEDIASTINUM: Stable cardiomediastinal silhouette. BONES AND SOFT TISSUES: No acute osseous abnormality. IMPRESSION: 1. Small bilateral pleural effusions. 2. Minimal bibasilar subsegmental atelectasis. Electronically signed by: Lynwood Seip MD 06/24/2024 04:16 PM EDT RP Workstation: [id number]    Procedures Procedures (including critical care time)  Medications Ordered in UC Medications  albuterol  (PROVENTIL ) (2.5 MG/3ML) 0.083% nebulizer solution 2.5 mg (2.5 mg Nebulization Given 06/24/24 1533)    Initial Impression / Assessment and Plan / UC Course  I have reviewed the triage vital signs and the nursing notes.  Pertinent labs & imaging results that were available during my care of the patient were reviewed by me and considered in my medical decision making (see chart for details).     Significantly hypertensive in triage, otherwise vital signs reassuring.  EKG was performed due to  history of atrial fibrillation and elevated blood pressure.  This showed normal sinus rhythm at 68 bpm with no acute ST elevations.  Chest x-ray today negative for pneumonia, significant improvement after albuterol  nebulizer treatment in clinic.  Will treat for COPD exacerbation with prednisone , Augmentin, albuterol  and continue to monitor closely her blood pressure readings.  Follow-up for worsening or unresolving symptoms.  Final Clinical Impressions(s) / UC Diagnoses   Final diagnoses:  Acute cough  COPD exacerbation (HCC)  Elevated blood pressure  reading   Discharge Instructions   None    ED Prescriptions     Medication Sig Dispense Auth. Provider   predniSONE  (DELTASONE ) 20 MG tablet Take 2 tablets (40 mg total) by mouth daily with breakfast. 10 tablet Stuart Vernell Norris, PA-C   albuterol  (VENTOLIN  HFA) 108 (90 Base) MCG/ACT inhaler Inhale 2 puffs into the lungs every 4 (four) hours as needed. 18 g Stuart Vernell Norris, PA-C   amoxicillin-clavulanate (AUGMENTIN) 875-125 MG tablet Take 1 tablet by mouth every 12 (twelve) hours. 14 tablet Stuart Vernell Norris, [place]      PDMP not reviewed this encounter.   Stuart Vernell Norris, [place] 06/24/24 1829

## 2024-06-24 NOTE — ED Triage Notes (Signed)
 Pt reports productive cough, chest congestion, voice hoarseness since Friday. Pt reports has tried nasonex  and tylenol  with minimal improvement of symptoms. Denies any known fevers.

## 2024-06-26 ENCOUNTER — Other Ambulatory Visit: Payer: Self-pay

## 2024-06-26 MED ORDER — APIXABAN 2.5 MG PO TABS: 2.5000 mg | ORAL_TABLET | Freq: Two times a day (BID) | ORAL | 5 refills | Status: DC

## 2024-06-26 NOTE — Telephone Encounter (Signed)
 Prescription refill request for Eliquis  received. Indication:afib Last office visit:8/25 Scr:4.02  6/25 Age: 83 Weight:49.4  kg  Prescription refilled

## 2024-06-28 DIAGNOSIS — Z992 Dependence on renal dialysis: Secondary | ICD-10-CM | POA: Diagnosis not present

## 2024-06-28 DIAGNOSIS — N186 End stage renal disease: Secondary | ICD-10-CM | POA: Diagnosis not present

## 2024-07-02 DIAGNOSIS — Z992 Dependence on renal dialysis: Secondary | ICD-10-CM | POA: Diagnosis not present

## 2024-07-02 DIAGNOSIS — N186 End stage renal disease: Secondary | ICD-10-CM | POA: Diagnosis not present

## 2024-07-02 DIAGNOSIS — D631 Anemia in chronic kidney disease: Secondary | ICD-10-CM | POA: Diagnosis not present

## 2024-07-21 ENCOUNTER — Ambulatory Visit (HOSPITAL_COMMUNITY): Admitting: Internal Medicine

## 2024-07-22 ENCOUNTER — Ambulatory Visit (HOSPITAL_COMMUNITY)
Admission: RE | Admit: 2024-07-22 | Discharge: 2024-07-22 | Disposition: A | Discharge status: Home / Self Care | Source: Ambulatory Visit | Attending: Internal Medicine | Admitting: Internal Medicine

## 2024-07-22 ENCOUNTER — Encounter (HOSPITAL_COMMUNITY): Payer: Self-pay | Admitting: Internal Medicine

## 2024-07-22 VITALS — BP 152/58 | HR 68 | Ht 59.0 in | Wt 117.2 lb

## 2024-07-22 DIAGNOSIS — D6869 Other thrombophilia: Secondary | ICD-10-CM

## 2024-07-22 DIAGNOSIS — Z5181 Encounter for therapeutic drug level monitoring: Secondary | ICD-10-CM | POA: Diagnosis not present

## 2024-07-22 DIAGNOSIS — I480 Paroxysmal atrial fibrillation: Secondary | ICD-10-CM | POA: Diagnosis not present

## 2024-07-22 DIAGNOSIS — Z79899 Other long term (current) drug therapy: Secondary | ICD-10-CM

## 2024-07-22 NOTE — Progress Notes (Addendum)
 Primary Care Physician: Bertell Satterfield, MD Primary Cardiologist: Alvan Carrier, MD Electrophysiologist: None     Referring Physician: Dr. Cindie Dickey Lindsay Cortez is a 83 y.o. female with a history of COPD, T2DM, hypothyroidism, ESRD on dialysis, HTN, and atrial fibrillation who presents for consultation in the [facility]. Hospital admission 12/2023 for new onset Afib. ED visits on 6/14 and 6/15 for Afib; s/p DCCV on 6/14. ED visit on 6/15 led to phone consultation with Dr. Cindie and patient started on amiodarone  200 mg BID. Patient is on Eliquis  2.5 mg BID for a CHADS2VASC score of 5.  On follow-up 07/22/2024, patient is here for amiodarone  surveillance.  She is currently in NSR.  She has had overall no A-fib burden since last visit.  She is taking amiodarone  200 mg once daily.  No bleeding issues on Eliquis  2.5 mg twice daily.  Today, she denies symptoms of palpitations, chest pain, shortness of breath, orthopnea, PND, lower extremity edema, dizziness, presyncope, syncope, snoring, daytime somnolence, bleeding, or neurologic sequela. The patient is tolerating medications without difficulties and is otherwise without complaint today.    she has a BMI of Body mass index is 23.67 kg/m.[name] Filed Weights   07/22/24 1110  Weight: 53.2 kg     Current Outpatient Medications  Medication Sig Dispense Refill   albuterol  (VENTOLIN  HFA) 108 (90 Base) MCG/ACT inhaler Inhale 2 puffs into the lungs every 4 (four) hours as needed. 18 g 0   allopurinol  (ZYLOPRIM ) 100 MG tablet Take 200 mg by mouth in the morning.     amiodarone  (PACERONE ) 200 MG tablet Take 1 tablet (200 mg total) by mouth 2 (two) times daily for 30 days, THEN 1 tablet (200 mg total) daily. 90 tablet 2   apixaban  (ELIQUIS ) 2.5 MG TABS tablet Take 1 tablet (2.5 mg total) by mouth 2 (two) times daily. 60 tablet 5   BREO ELLIPTA  100-25 MCG/ACT AEPB Inhale 1 puff into the lungs daily.     Calcium  Carb-Cholecalciferol  (CALCIUM 600 + D PO) Take 1 tablet by mouth every evening.     cephALEXin  (KEFLEX ) 250 MG capsule Take 250 mg by mouth at bedtime.     Cholecalciferol  (VITAMIN D ) 2000 UNITS CAPS Take 2,000 Units by mouth every evening.     furosemide  (LASIX ) 20 MG tablet Take 20 mg by mouth daily as needed for fluid or edema.     levothyroxine  (SYNTHROID ) 88 MCG tablet Take 1 tablet (88 mcg total) by mouth daily before breakfast. 30 tablet 3   loperamide  (IMODIUM ) 2 MG capsule Take 2 mg by mouth in the morning.     metoprolol  succinate (TOPROL -XL) 25 MG 24 hr tablet Take 1 tablet (25 mg total) by mouth daily. 30 tablet 6   simvastatin  (ZOCOR ) 10 MG tablet Take 10 mg by mouth at bedtime.     zinc sulfate 220 (50 Zn) MG capsule Take 220 mg by mouth in the morning.     No current facility-administered medications for this encounter.    Atrial Fibrillation Management history:  Previous antiarrhythmic drugs: amiodarone  Previous cardioversions: 02/09/24 (ED) Previous ablations: none Anticoagulation history: Eliquis  2.5 mg BID   ROS- All systems are reviewed and negative except as per the HPI above.  Physical Exam: BP (!) 152/58   Pulse 68   Ht 4' 11 (1.499 m)   Wt 53.2 kg   BMI 23.67 kg/m   GEN- The patient is well appearing, alert and  oriented x 3 today.   Neck - no JVD or carotid bruit noted Lungs- Clear to ausculation bilaterally, normal work of breathing Heart- Regular rate and rhythm, 2/6 systolic murmur; no rubs or gallops; PMI not laterally displaced Extremities- no clubbing, cyanosis, or edema Skin - no rash or ecchymosis noted   EKG today demonstrates  EKG Interpretation Date/Time:  Tuesday July 22 2024 11:13:29 EST Ventricular Rate:  68 PR Interval:  204 QRS Duration:  146 QT Interval:  506 QTC Calculation: 538 R Axis:   103  Text Interpretation: Normal sinus rhythm Right bundle branch block Abnormal ECG When compared with ECG of 24-Jun-2024 15:23,  PREVIOUS ECG IS PRESENT Confirmed by Lindsay Cortez ([id number]) on 07/22/2024 11:29:33 AM    Echo 01/14/24 demonstrated   1. Left ventricular ejection fraction, by estimation, is 60 to 65%. The  left ventricle has normal function. The left ventricle has no regional  wall motion abnormalities. There is mild left ventricular hypertrophy.  Left ventricular diastolic parameters  are indeterminate. Elevated left atrial pressure.   2. Right ventricular systolic function is low normal. The right  ventricular size is normal. There is mildly elevated pulmonary artery  systolic pressure.   3. Left atrial size was severely dilated.   4. Right atrial size was mildly dilated.   5. The mitral valve is normal in structure. Trivial mitral valve  regurgitation. No evidence of mitral stenosis.   6. The aortic valve was not well visualized. There is mild calcification  of the aortic valve. There is mild thickening of the aortic valve. Aortic  valve regurgitation is not visualized. No aortic stenosis is present.   7. The inferior vena cava is dilated in size with <50% respiratory  variability, suggesting right atrial pressure of 15 mmHg.   ASSESSMENT & PLAN CHA2DS2-VASc Score = 5  The patient's score is based upon: CHF History: 0 HTN History: 1 Diabetes History: 1 Stroke History: 0 Vascular Disease History: 0 Age Score: 2 Gender Score: 1       ASSESSMENT AND PLAN: Paroxysmal Atrial Fibrillation (ICD10:  I48.0) The patient's CHA2DS2-VASc score is 5, indicating a 7.2% annual risk of stroke.    Patient is currently in NSR.  Continue Toprol  25 mg daily.  High risk medication monitoring (ICD10: [id number]) Patient requires ongoing monitoring for anti-arrhythmic medication which has the potential to cause life threatening arrhythmias or AV block. Qtc stable. Continue amiodarone  200 mg daily. Cmet and TSH drawn today.  Secondary Hypercoagulable State (ICD10:  D68.69) The patient is at significant risk for  stroke/thromboembolism based upon her CHA2DS2-VASc Score of 5.  Continue Apixaban  (Eliquis ).  Continue Eliquis  2.5 mg twice daily.     Follow-up 6 months with A-fib clinic for amiodarone  surveillance.   Lindsay Pac, PA-C  [facility] [address] [place], [place] [address] [phone]

## 2024-07-22 NOTE — Addendum Note (Signed)
 Encounter addended by: Terra Fairy PARAS, PA-C on: 07/22/2024 2:16 PM  Actions taken: Clinical Note Signed

## 2024-07-24 LAB — COMPREHENSIVE METABOLIC PANEL WITH GFR
ALT: 10 IU/L (ref 0–32)
AST: 14 IU/L (ref 0–40)
Albumin: 3.6 g/dL — ABNORMAL LOW (ref 3.7–4.7)
Alkaline Phosphatase: 132 IU/L — ABNORMAL HIGH (ref 48–129)
BUN/Creatinine Ratio: 8 — ABNORMAL LOW (ref 12–28)
BUN: 26 mg/dL (ref 8–27)
Bilirubin Total: 0.5 mg/dL (ref 0.0–1.2)
CO2: 27 mmol/L (ref 20–29)
Calcium: 8.3 mg/dL — ABNORMAL LOW (ref 8.7–10.3)
Chloride: 102 mmol/L (ref 96–106)
Creatinine, Ser: 3.1 mg/dL — ABNORMAL HIGH (ref 0.57–1.00)
Globulin, Total: 2.1 g/dL (ref 1.5–4.5)
Glucose: 99 mg/dL (ref 70–99)
Potassium: 4.7 mmol/L (ref 3.5–5.2)
Sodium: 143 mmol/L (ref 134–144)
Total Protein: 5.7 g/dL — ABNORMAL LOW (ref 6.0–8.5)
eGFR: 14 mL/min/1.73 — ABNORMAL LOW (ref >59)

## 2024-07-24 LAB — TSH: TSH: 0.527 u[IU]/mL (ref 0.450–4.500)

## 2024-07-28 ENCOUNTER — Ambulatory Visit (HOSPITAL_COMMUNITY): Payer: Self-pay | Admitting: Internal Medicine

## 2024-07-30 ENCOUNTER — Emergency Department (HOSPITAL_COMMUNITY)

## 2024-07-30 ENCOUNTER — Encounter (HOSPITAL_COMMUNITY): Payer: Self-pay

## 2024-07-30 ENCOUNTER — Emergency Department (HOSPITAL_COMMUNITY)
Admission: EM | Admit: 2024-07-30 | Discharge: 2024-07-30 | Disposition: A | Discharge status: Home / Self Care | Source: Ambulatory Visit | Attending: Emergency Medicine | Admitting: Emergency Medicine

## 2024-07-30 ENCOUNTER — Other Ambulatory Visit: Payer: Self-pay

## 2024-07-30 DIAGNOSIS — I6523 Occlusion and stenosis of bilateral carotid arteries: Secondary | ICD-10-CM | POA: Diagnosis not present

## 2024-07-30 DIAGNOSIS — R29810 Facial weakness: Secondary | ICD-10-CM | POA: Diagnosis not present

## 2024-07-30 DIAGNOSIS — E039 Hypothyroidism, unspecified: Secondary | ICD-10-CM | POA: Insufficient documentation

## 2024-07-30 DIAGNOSIS — I672 Cerebral atherosclerosis: Secondary | ICD-10-CM | POA: Diagnosis not present

## 2024-07-30 DIAGNOSIS — Z7951 Long term (current) use of inhaled steroids: Secondary | ICD-10-CM | POA: Diagnosis not present

## 2024-07-30 DIAGNOSIS — Z7901 Long term (current) use of anticoagulants: Secondary | ICD-10-CM | POA: Diagnosis not present

## 2024-07-30 DIAGNOSIS — Z992 Dependence on renal dialysis: Secondary | ICD-10-CM | POA: Diagnosis not present

## 2024-07-30 DIAGNOSIS — R29898 Other symptoms and signs involving the musculoskeletal system: Secondary | ICD-10-CM | POA: Diagnosis not present

## 2024-07-30 DIAGNOSIS — G510 Bell's palsy: Secondary | ICD-10-CM | POA: Insufficient documentation

## 2024-07-30 DIAGNOSIS — I10 Essential (primary) hypertension: Secondary | ICD-10-CM | POA: Diagnosis not present

## 2024-07-30 DIAGNOSIS — N186 End stage renal disease: Secondary | ICD-10-CM | POA: Diagnosis not present

## 2024-07-30 DIAGNOSIS — J449 Chronic obstructive pulmonary disease, unspecified: Secondary | ICD-10-CM | POA: Diagnosis not present

## 2024-07-30 DIAGNOSIS — R930 Abnormal findings on diagnostic imaging of skull and head, not elsewhere classified: Secondary | ICD-10-CM | POA: Diagnosis not present

## 2024-07-30 DIAGNOSIS — Z79899 Other long term (current) drug therapy: Secondary | ICD-10-CM | POA: Insufficient documentation

## 2024-07-30 DIAGNOSIS — I4891 Unspecified atrial fibrillation: Secondary | ICD-10-CM | POA: Diagnosis not present

## 2024-07-30 DIAGNOSIS — E119 Type 2 diabetes mellitus without complications: Secondary | ICD-10-CM | POA: Insufficient documentation

## 2024-07-30 DIAGNOSIS — M542 Cervicalgia: Secondary | ICD-10-CM | POA: Diagnosis not present

## 2024-07-30 LAB — DIFFERENTIAL
Abs Immature Granulocytes: 0.03 K/uL (ref 0.00–0.07)
Basophils Absolute: 0 K/uL (ref 0.0–0.1)
Basophils Relative: 0 %
Eosinophils Absolute: 0.1 K/uL (ref 0.0–0.5)
Eosinophils Relative: 1 %
Immature Granulocytes: 1 %
Lymphocytes Relative: 14 %
Lymphs Abs: 0.9 K/uL (ref 0.7–4.0)
Monocytes Absolute: 0.9 K/uL (ref 0.1–1.0)
Monocytes Relative: 15 %
Neutro Abs: 4.4 K/uL (ref 1.7–7.7)
Neutrophils Relative %: 69 %

## 2024-07-30 LAB — COMPREHENSIVE METABOLIC PANEL WITH GFR
ALT: 9 U/L (ref 0–44)
AST: 19 U/L (ref 15–41)
Albumin: 3.6 g/dL (ref 3.5–5.0)
Alkaline Phosphatase: 113 U/L (ref 38–126)
Anion gap: 12 (ref 5–15)
BUN: 13 mg/dL (ref 8–23)
CO2: 27 mmol/L (ref 22–32)
Calcium: 8.2 mg/dL — ABNORMAL LOW (ref 8.9–10.3)
Chloride: 100 mmol/L (ref 98–111)
Creatinine, Ser: 2.15 mg/dL — ABNORMAL HIGH (ref 0.44–1.00)
GFR, Estimated: 22 mL/min — ABNORMAL LOW (ref >60)
Glucose, Bld: 99 mg/dL (ref 70–99)
Potassium: 3.7 mmol/L (ref 3.5–5.1)
Sodium: 139 mmol/L (ref 135–145)
Total Bilirubin: 0.5 mg/dL (ref 0.0–1.2)
Total Protein: 5.7 g/dL — ABNORMAL LOW (ref 6.5–8.1)

## 2024-07-30 LAB — CBG MONITORING, ED
Glucose-Capillary: 133 mg/dL — ABNORMAL HIGH (ref 70–99)
Glucose-Capillary: 76 mg/dL (ref 70–99)

## 2024-07-30 LAB — CBC
HCT: 32.1 % — ABNORMAL LOW (ref 36.0–46.0)
Hemoglobin: 10 g/dL — ABNORMAL LOW (ref 12.0–15.0)
MCH: 31.3 pg (ref 26.0–34.0)
MCHC: 31.2 g/dL (ref 30.0–36.0)
MCV: 100.6 fL — ABNORMAL HIGH (ref 80.0–100.0)
Platelets: 227 K/uL (ref 150–400)
RBC: 3.19 MIL/uL — ABNORMAL LOW (ref 3.87–5.11)
RDW: 15.6 % — ABNORMAL HIGH (ref 11.5–15.5)
WBC: 6.3 K/uL (ref 4.0–10.5)
nRBC: 0 % (ref 0.0–0.2)

## 2024-07-30 LAB — PROTIME-INR
INR: 1.3 — ABNORMAL HIGH (ref 0.8–1.2)
Prothrombin Time: 17.2 s — ABNORMAL HIGH (ref 11.4–15.2)

## 2024-07-30 LAB — ETHANOL: Alcohol, Ethyl (B): <15 mg/dL (ref <15)

## 2024-07-30 LAB — APTT: aPTT: 36 s (ref 24–36)

## 2024-07-30 MED ORDER — LORAZEPAM 2 MG/ML IJ SOLN
0.5000 mg | INTRAMUSCULAR | Status: AC
  Administered 2024-07-30: 0.5 mg via INTRAVENOUS
  Filled 2024-07-30: qty 1

## 2024-07-30 MED ORDER — IOHEXOL 350 MG/ML SOLN
75.0000 mL | Freq: Once | INTRAVENOUS | Status: AC | PRN
  Administered 2024-07-30: 75 mL via INTRAVENOUS

## 2024-07-30 MED ORDER — ARTIFICIAL TEARS OPHTHALMIC OINT: TOPICAL_OINTMENT | OPHTHALMIC | 1 refills | Status: DC

## 2024-07-30 MED ORDER — VALACYCLOVIR HCL 1 G PO TABS: 1000.0000 mg | ORAL_TABLET | Freq: Three times a day (TID) | ORAL | 0 refills | Status: DC

## 2024-07-30 MED ORDER — PREDNISONE 20 MG PO TABS: ORAL_TABLET | ORAL | 0 refills | Status: DC

## 2024-07-30 MED ORDER — HYPROMELLOSE (GONIOSCOPIC) 2.5 % OP SOLN: OPHTHALMIC | 12 refills | Status: DC

## 2024-07-30 NOTE — ED Provider Notes (Signed)
 [place] EMERGENCY DEPARTMENT AT [facility] Provider Note   CSN: [id number] Arrival date & time: 07/30/24  1309     Patient presents with: Facial Droop   Lindsay Cortez is a 83 y.o. female.  {Add pertinent medical, surgical, social history, OB history to HPI:5943} 83 year old female history of COPD, diabetes, hypothyroidism, ESRD on IHD, and atrial fibrillation on Eliquis  who presents to the emergency department with weakness.  Patient reports that yesterday she started having some right ear pain radiating down her right neck.  Last night noticed that her right eye was watering but is not having pain in it.  Says that at 7:30 AM she noticed that the right side of her face was not moving as well and she was having difficulty closing her right eye.  Went to dialysis and they referred her to the emergency department for stroke evaluation.  In triage was noted to have right lower extremity weakness.  Says that intermittently her legs will get weak on her but says that it is typically bilateral.  She is not have any new back or neck pain.       Prior to Admission medications   Medication Sig Start Date End Date Taking? Authorizing Provider  albuterol  (VENTOLIN  HFA) 108 (90 Base) MCG/ACT inhaler Inhale 2 puffs into the lungs every 4 (four) hours as needed. 06/24/24   Stuart Vernell Norris, PA-C  allopurinol  (ZYLOPRIM ) 100 MG tablet Take 200 mg by mouth in the morning.    [provider]  amiodarone  (PACERONE ) 200 MG tablet Take 1 tablet (200 mg total) by mouth 2 (two) times daily for 30 days, THEN 1 tablet (200 mg total) daily. 02/10/23 07/22/24  Terra Fairy PARAS, PA-C  apixaban  (ELIQUIS ) 2.5 MG TABS tablet Take 1 tablet (2.5 mg total) by mouth 2 (two) times daily. 06/26/24   Alvan Dorn FALCON, MD  BREO ELLIPTA  100-25 MCG/ACT AEPB Inhale 1 puff into the lungs daily. 05/29/23   [provider]  Calcium Carb-Cholecalciferol  (CALCIUM 600 + D PO) Take 1 tablet by  mouth every evening.    [provider]  cephALEXin  (KEFLEX ) 250 MG capsule Take 250 mg by mouth at bedtime. 12/29/23   [provider]  Cholecalciferol  (VITAMIN D ) 2000 UNITS CAPS Take 2,000 Units by mouth every evening.    [provider]  furosemide  (LASIX ) 20 MG tablet Take 20 mg by mouth daily as needed for fluid or edema. 03/25/21   [provider]  levothyroxine  (SYNTHROID ) 88 MCG tablet Take 1 tablet (88 mcg total) by mouth daily before breakfast. 01/16/24   Emokpae, Courage, MD  loperamide  (IMODIUM ) 2 MG capsule Take 2 mg by mouth in the morning.    [provider]  metoprolol  succinate (TOPROL -XL) 25 MG 24 hr tablet Take 1 tablet (25 mg total) by mouth daily. 04/15/24   Alvan Dorn FALCON, MD  simvastatin  (ZOCOR ) 10 MG tablet Take 10 mg by mouth at bedtime.    [provider]  zinc sulfate 220 (50 Zn) MG capsule Take 220 mg by mouth in the morning.    [provider]    Allergies: Codeine, Fentanyl , Levaquin  [levofloxacin  in d5w], and Levofloxacin     Review of Systems  Updated Vital Signs BP (!) 188/59   Pulse 66   Resp 20   Ht 4' 11 (1.499 m)   Wt 53.2 kg   SpO2 92%   BMI 23.67 kg/m   Physical Exam Vitals and nursing note reviewed.  Constitutional:  General: She is not in acute distress.    Appearance: She is well-developed.  HENT:     Head: Normocephalic and atraumatic.     Right Ear: Tympanic membrane, ear canal and external ear normal.     Left Ear: Tympanic membrane, ear canal and external ear normal.     Nose: Nose normal.  Eyes:     Extraocular Movements: Extraocular movements intact.     Conjunctiva/sclera: Conjunctivae normal.     Pupils: Pupils are equal, round, and reactive to light.  Cardiovascular:     Rate and Rhythm: Normal rate and regular rhythm.     Heart sounds: No murmur heard. Pulmonary:     Effort: Pulmonary effort is normal. No respiratory distress.     Breath sounds: Normal  breath sounds.  Musculoskeletal:     Cervical back: Normal range of motion and neck supple.     Right lower leg: No edema.     Left lower leg: No edema.  Skin:    General: Skin is warm and dry.  Neurological:     Mental Status: She is alert.     Comments: NIHSS Exam  Level of Consciousness: Alert  LOC Questions: Answers Month and Age Correctly  LOC Commands: Opens and Closes Eyes and Hands on command  Best Gaze: Horizontal ocular movements intact  Visual Fields: No visual field loss  Facial Palsy: Right-sided facial paralysis.  Having difficulty closing her right eye.  Does not appear to involve the forehead on the right side though L Upper Extremity Motor: No drift after 10 seconds  R Upper Extremity Motor: No drift after 10 seconds  L Lower extremity Motor: No drift after 5 seconds  R Lower extremity Motor: Drift and hits the stretcher Ataxia: Absent  Sensory: Intact sensation to light touch on face, arms, trunk, and legs bilaterally  Best Language: No aphasia  Dysarthria: No dysarthria  Neglect: No visual or sensory neglect     Psychiatric:        Mood and Affect: Mood normal.     (all labs ordered are listed, but only abnormal results are displayed) Labs Reviewed  CBG MONITORING, ED - Abnormal; Notable for the following components:      Result Value   Glucose-Capillary 133 (*)    All other components within normal limits  PROTIME-INR  APTT  CBC  DIFFERENTIAL  COMPREHENSIVE METABOLIC PANEL WITH GFR  ETHANOL  URINE DRUG SCREEN  CBG MONITORING, ED    EKG: None  Radiology: No results found.  {Document cardiac monitor, telemetry assessment procedure when appropriate:32947} Procedures   Medications Ordered in the ED - No data to display    {Click here for ABCD2, HEART and other calculators REFRESH Note before signing:1}                              Medical Decision Making Amount and/or Complexity of Data Reviewed Labs: ordered. Radiology:  ordered.   ***  {Document critical care time when appropriate  Document review of labs and clinical decision tools ie CHADS2VASC2, etc  Document your independent review of radiology images and any outside records  Document your discussion with family members, caretakers and with consultants  Document social determinants of health affecting pt's care  Document your decision making why or why not admission, treatments were needed:32947:::1}   Final diagnoses:  None    ED Discharge Orders     None

## 2024-07-30 NOTE — Discharge Instructions (Addendum)
 Follow-up with the neurologist in the next couple weeks for recheck

## 2024-07-30 NOTE — ED Triage Notes (Signed)
 Pt BIB ems from home for rt side facial droop starting around 0730 this morning. Pt has rt side facial droop and rt leg will drop before count of 5. EDP at bedside during triage.

## 2024-07-31 ENCOUNTER — Encounter: Payer: Self-pay | Admitting: Neurology

## 2024-08-01 ENCOUNTER — Other Ambulatory Visit: Payer: Self-pay

## 2024-08-01 ENCOUNTER — Emergency Department (HOSPITAL_COMMUNITY): Admission: EM | Admit: 2024-08-01 | Discharge: 2024-08-01 | Disposition: A | Discharge status: Home / Self Care

## 2024-08-01 ENCOUNTER — Encounter (HOSPITAL_COMMUNITY): Payer: Self-pay

## 2024-08-01 DIAGNOSIS — Z7901 Long term (current) use of anticoagulants: Secondary | ICD-10-CM | POA: Insufficient documentation

## 2024-08-01 DIAGNOSIS — R112 Nausea with vomiting, unspecified: Secondary | ICD-10-CM | POA: Insufficient documentation

## 2024-08-01 DIAGNOSIS — N186 End stage renal disease: Secondary | ICD-10-CM | POA: Diagnosis not present

## 2024-08-01 DIAGNOSIS — Z87448 Personal history of other diseases of urinary system: Secondary | ICD-10-CM

## 2024-08-01 DIAGNOSIS — I120 Hypertensive chronic kidney disease with stage 5 chronic kidney disease or end stage renal disease: Secondary | ICD-10-CM | POA: Diagnosis not present

## 2024-08-01 DIAGNOSIS — Z992 Dependence on renal dialysis: Secondary | ICD-10-CM | POA: Diagnosis not present

## 2024-08-01 LAB — CBC WITH DIFFERENTIAL/PLATELET
Abs Immature Granulocytes: 0.04 K/uL (ref 0.00–0.07)
Basophils Absolute: 0 K/uL (ref 0.0–0.1)
Basophils Relative: 0 %
Eosinophils Absolute: 0 K/uL (ref 0.0–0.5)
Eosinophils Relative: 0 %
HCT: 34.2 % — ABNORMAL LOW (ref 36.0–46.0)
Hemoglobin: 10.6 g/dL — ABNORMAL LOW (ref 12.0–15.0)
Immature Granulocytes: 1 %
Lymphocytes Relative: 8 %
Lymphs Abs: 0.6 K/uL — ABNORMAL LOW (ref 0.7–4.0)
MCH: 30.9 pg (ref 26.0–34.0)
MCHC: 31 g/dL (ref 30.0–36.0)
MCV: 99.7 fL (ref 80.0–100.0)
Monocytes Absolute: 0.7 K/uL (ref 0.1–1.0)
Monocytes Relative: 9 %
Neutro Abs: 6.8 K/uL (ref 1.7–7.7)
Neutrophils Relative %: 82 %
Platelets: 225 K/uL (ref 150–400)
RBC: 3.43 MIL/uL — ABNORMAL LOW (ref 3.87–5.11)
RDW: 15.3 % (ref 11.5–15.5)
WBC: 8.2 K/uL (ref 4.0–10.5)
nRBC: 0 % (ref 0.0–0.2)

## 2024-08-01 LAB — BASIC METABOLIC PANEL WITH GFR
Anion gap: 17 — ABNORMAL HIGH (ref 5–15)
BUN: 34 mg/dL — ABNORMAL HIGH (ref 8–23)
CO2: 22 mmol/L (ref 22–32)
Calcium: 8.5 mg/dL — ABNORMAL LOW (ref 8.9–10.3)
Chloride: 102 mmol/L (ref 98–111)
Creatinine, Ser: 4.4 mg/dL — ABNORMAL HIGH (ref 0.44–1.00)
GFR, Estimated: 9 mL/min — ABNORMAL LOW (ref >60)
Glucose, Bld: 169 mg/dL — ABNORMAL HIGH (ref 70–99)
Potassium: 4.3 mmol/L (ref 3.5–5.1)
Sodium: 140 mmol/L (ref 135–145)

## 2024-08-01 LAB — TROPONIN T, HIGH SENSITIVITY
Troponin T High Sensitivity: <15 ng/L (ref 0–19)
Troponin T High Sensitivity: <15 ng/L (ref 0–19)

## 2024-08-01 LAB — HEPATIC FUNCTION PANEL
ALT: 11 U/L (ref 0–44)
AST: 20 U/L (ref 15–41)
Albumin: 4 g/dL (ref 3.5–5.0)
Alkaline Phosphatase: 114 U/L (ref 38–126)
Bilirubin, Direct: 0.3 mg/dL — ABNORMAL HIGH (ref 0.0–0.2)
Indirect Bilirubin: 0.2 mg/dL — ABNORMAL LOW (ref 0.3–0.9)
Total Bilirubin: 0.5 mg/dL (ref 0.0–1.2)
Total Protein: 6.4 g/dL — ABNORMAL LOW (ref 6.5–8.1)

## 2024-08-01 LAB — LIPASE, BLOOD: Lipase: 19 U/L (ref 11–51)

## 2024-08-01 MED ORDER — LACTATED RINGERS IV BOLUS
1000.0000 mL | Freq: Once | INTRAVENOUS | Status: AC
  Administered 2024-08-01: 1000 mL via INTRAVENOUS

## 2024-08-01 MED ORDER — ONDANSETRON HCL 4 MG PO TABS: 4.0000 mg | ORAL_TABLET | Freq: Three times a day (TID) | ORAL | 0 refills | Status: AC | PRN

## 2024-08-01 MED ORDER — ONDANSETRON HCL 4 MG/2ML IJ SOLN
4.0000 mg | Freq: Once | INTRAMUSCULAR | Status: AC
  Administered 2024-08-01: 4 mg via INTRAVENOUS
  Filled 2024-08-01: qty 2

## 2024-08-01 NOTE — ED Provider Notes (Signed)
 [place] [facility] AT [facility] Provider Note   CSN: [id number] Arrival date & time: 08/01/24  1106     Patient presents with: Emesis   Lindsay Cortez is a 83 y.o. female.   83 year old female presents for evaluation of nausea and vomiting.  States she woke up in the [place] feeling very sick.  Has had no diarrhea but does admit to some vomiting.  States she feels very lightheaded as well.  She has not had any abdominal pain.  States she went to a party last night but denies drinking any alcohol.  She was seen in the ER yesterday and diagnosed with Bell's palsy.  States she felt fine yesterday.  Denies any other symptoms or concerns.   Emesis Associated symptoms: no abdominal pain, no arthralgias, no chills, no cough, no fever and no sore throat        Prior to Admission medications   Medication Sig Start Date End Date Taking? Authorizing Provider  allopurinol  (ZYLOPRIM ) 100 MG tablet Take 200 mg by mouth in the morning.   Yes [provider]  amiodarone  (PACERONE ) 200 MG tablet Take 1 tablet (200 mg total) by mouth 2 (two) times daily for 30 days, THEN 1 tablet (200 mg total) daily. 02/10/23 08/01/24 Yes Terra Fairy PARAS, PA-C  apixaban  (ELIQUIS ) 2.5 MG TABS tablet Take 1 tablet (2.5 mg total) by mouth 2 (two) times daily. 06/26/24  Yes BranchDorn FALCON, MD  artificial tears (LACRILUBE) OINT ophthalmic ointment Put ointment in your affected eye and tape it shut at night when you sleep 07/30/24  Yes Zammit, Joseph, MD  BREO ELLIPTA  100-25 MCG/ACT AEPB Inhale 1 puff into the lungs daily. 05/29/23  Yes [provider]  Calcium Carb-Cholecalciferol  (CALCIUM 600 + D PO) Take 1 tablet by mouth every evening.   Yes [provider]  cephALEXin  (KEFLEX ) 250 MG capsule Take 250 mg by mouth at bedtime. 12/29/23  Yes [provider]  furosemide  (LASIX ) 20 MG tablet Take 20 mg by mouth daily as needed for fluid or edema. 03/25/21  Yes  [provider]  hydroxypropyl methylcellulose / hypromellose (ISOPTO TEARS / GONIOVISC) 2.5 % ophthalmic solution Put 1 to 2 drops in the affected eye every 2-3 hours while you are awake to keep it moist 07/30/24  Yes Suzette Fairy, MD  levothyroxine  (SYNTHROID ) 112 MCG tablet Take 112 mcg by mouth daily before breakfast.   Yes [provider]  loperamide  (IMODIUM ) 2 MG capsule Take 2 mg by mouth in the morning.   Yes [provider]  metoprolol  succinate (TOPROL -XL) 25 MG 24 hr tablet Take 1 tablet (25 mg total) by mouth daily. 04/15/24  Yes Branch, Dorn FALCON, MD  ondansetron  (ZOFRAN ) 4 MG tablet Take 1 tablet (4 mg total) by mouth every 8 (eight) hours as needed for up to 4 days. 08/01/24 08/05/24 Yes Vylet Maffia L, DO  predniSONE  (DELTASONE ) 20 MG tablet Take 3 tablets once a day for a week 07/30/24  Yes Suzette Fairy, MD  simvastatin  (ZOCOR ) 10 MG tablet Take 10 mg by mouth at bedtime.   Yes [provider]  valACYclovir  (VALTREX ) 1000 MG tablet Take 1 tablet (1,000 mg total) by mouth 3 (three) times daily. 07/30/24  Yes Zammit, Joseph, MD  zinc sulfate 220 (50 Zn) MG capsule Take 220 mg by mouth in the morning.   Yes [provider]  albuterol  (VENTOLIN  HFA) 108 (90 Base) MCG/ACT inhaler Inhale 2 puffs into the lungs  every 4 (four) hours as needed. 06/24/24   Stuart Vernell Norris, PA-C    Allergies: Codeine, Fentanyl , Levaquin  [levofloxacin  in d5w], and Levofloxacin     Review of Systems  Constitutional:  Negative for chills and fever.  HENT:  Negative for ear pain and sore throat.   Eyes:  Negative for pain and visual disturbance.  Respiratory:  Negative for cough and shortness of breath.   Cardiovascular:  Negative for chest pain and palpitations.  Gastrointestinal:  Positive for nausea and vomiting. Negative for abdominal pain.  Genitourinary:  Negative for dysuria and hematuria.  Musculoskeletal:  Negative for arthralgias and back pain.   Skin:  Negative for color change and rash.  Neurological:  Positive for light-headedness. Negative for seizures and syncope.  All other systems reviewed and are negative.   Updated Vital Signs BP (!) 181/68   Pulse 71   Temp 97.8 F (36.6 C) (Oral)   Resp (!) 21   Ht 4' 11 (1.499 m)   Wt 53 kg   SpO2 (!) 84%   BMI 23.60 kg/m   Physical Exam Vitals and nursing note reviewed.  Constitutional:      General: She is not in acute distress.    Appearance: Normal appearance. She is well-developed. She is not ill-appearing.  HENT:     Head: Normocephalic and atraumatic.  Eyes:     Conjunctiva/sclera: Conjunctivae normal.  Cardiovascular:     Rate and Rhythm: Normal rate and regular rhythm.     Heart sounds: No murmur heard. Pulmonary:     Effort: Pulmonary effort is normal. No respiratory distress.     Breath sounds: Normal breath sounds.  Abdominal:     Palpations: Abdomen is soft.     Tenderness: There is no abdominal tenderness.  Musculoskeletal:        General: No swelling.     Cervical back: Neck supple.  Skin:    General: Skin is warm and dry.     Capillary Refill: Capillary refill takes less than 2 seconds.  Neurological:     General: No focal deficit present.     Mental Status: She is alert.  Psychiatric:        Mood and Affect: Mood normal.     (all labs ordered are listed, but only abnormal results are displayed) Labs Reviewed  BASIC METABOLIC PANEL WITH GFR - Abnormal; Notable for the following components:      Result Value   Glucose, Bld 169 (*)    BUN 34 (*)    Creatinine, Ser 4.40 (*)    Calcium 8.5 (*)    GFR, Estimated 9 (*)    Anion gap 17 (*)    All other components within normal limits  CBC WITH DIFFERENTIAL/PLATELET - Abnormal; Notable for the following components:   RBC 3.43 (*)    Hemoglobin 10.6 (*)    HCT 34.2 (*)    Lymphs Abs 0.6 (*)    All other components within normal limits  HEPATIC FUNCTION PANEL - Abnormal; Notable for the  following components:   Total Protein 6.4 (*)    Bilirubin, Direct 0.3 (*)    Indirect Bilirubin 0.2 (*)    All other components within normal limits  LIPASE, BLOOD  TROPONIN T, HIGH SENSITIVITY  TROPONIN T, HIGH SENSITIVITY    EKG: EKG Interpretation Date/Time:  Friday August 01 2024 11:19:22 EST Ventricular Rate:  77 PR Interval:  218 QRS Duration:  169 QT Interval:  471 QTC Calculation: 534 R Axis:  149  Text Interpretation: Sinus rhythm Borderline prolonged PR interval Nonspecific intraventricular conduction delay Compared with prior EKG from 07/30/2024 Confirmed by Gennaro Bouchard ([id number]) on 08/01/2024 11:47:41 AM  Radiology: CT ANGIO HEAD NECK W WO CM Result Date: 07/30/2024 EXAM: CT HEAD WITHOUT CTA HEAD AND NECK WITH AND WITHOUT 07/30/2024 04:41:10 PM TECHNIQUE: CTA of the head and neck was performed with and without the administration of 75 mL of iohexol  (OMNIPAQUE ) 350 MG/ML injection. Noncontrast CT of the head with reconstructed 2-D images are also provided for review. Multiplanar 2D and/or 3D reformatted images are provided for review. Automated exposure control, iterative reconstruction, and/or weight based adjustment of the mA/kV was utilized to reduce the radiation dose to as low as reasonably achievable. COMPARISON: None available CLINICAL HISTORY: Right-sided neck pain, facial paralysis, leg weakness. Acute onset of symptoms beginning at 7:30 am. Right lower extremity weakness. FINDINGS: CT HEAD: BRAIN AND VENTRICLES: No acute intracranial hemorrhage. No mass effect or midline shift. No extra-axial fluid collection. Gray-white differentiation is maintained. No hydrocephalus. ORBITS: No acute abnormality. SINUSES: No acute abnormality. SOFT TISSUES AND SKULL: No acute abnormality. CTA NECK: AORTIC ARCH AND ARCH VESSELS: Atherosclerotic changes are present in the aortic arch and great vessel origins without focal stenosis or aneurysm. No dissection or arterial injury.  CERVICAL CAROTID ARTERIES: Atherosclerotic changes are present in the right carotid bifurcation without significant stenosis. Atherosclerotic changes are present in the left carotid bifurcation without significant stenosis. No dissection, arterial injury, or hemodynamically significant stenosis by NASCET criteria. CERVICAL VERTEBRAL ARTERIES: The right vertebral artery is slightly dominant to the left. Atherosclerotic calcifications are present in the dural margin of both vertebral arteries. Moderate stenosis is present in the left V4 segment beginning at the dural margin. No dissection or arterial injury. VISUALIZED LUNGS AND MEDIASTINUM: Unremarkable. SOFT TISSUES: No acute abnormality. BONES: No acute abnormality. CTA HEAD: ANTERIOR CIRCULATION: Atherosclerotic calcifications are present in the cavernous internal carotid arteries bilaterally without significant stenosis. No significant stenosis of the anterior cerebral arteries. No significant stenosis of the middle cerebral arteries. No aneurysm. POSTERIOR CIRCULATION: No significant stenosis of the posterior cerebral arteries. No significant stenosis of the basilar artery. No significant stenosis of the vertebral arteries. No aneurysm. OTHER: No dural venous sinus thrombosis on this non-dedicated study. IMPRESSION: 1. No acute intracranial abnormality. Noncontrast head CT within normal limits for age. 2. Moderate stenosis in the left V4 segment beginning at the dural margin. 3. Atherosclerotic changes in the aortic arch, great vessel origins, carotid bifurcations, and cavernous internal carotid arteries bilaterally without significant stenosis. Electronically signed by: Lonni Necessary MD 07/30/2024 04:52 PM EST RP Workstation: [id number]   MR BRAIN WO CONTRAST Result Date: 07/30/2024 EXAM: MRI BRAIN WITHOUT CONTRAST 07/30/2024 03:52:49 PM TECHNIQUE: Multiplanar multisequence MRI of the head/brain was performed without the administration of intravenous  contrast. COMPARISON: None available. CLINICAL HISTORY: Facial paralysis, right-sided leg weakness. FINDINGS: BRAIN AND VENTRICLES: No acute infarct. No intracranial hemorrhage. No mass. No midline shift. No hydrocephalus. Periventricular and scattered subcortical T2 hyperintensities bilaterally are mildly advanced for age. Remote lacunar infarcts are present in the thalami bilaterally. A remote lacunar infarct is present in the right cerebellum. White matter changes extend into the brainstem. The sella is unremarkable. Normal flow voids. ORBITS: No acute abnormality. SINUSES AND MASTOIDS: No acute abnormality. BONES AND SOFT TISSUES: Normal marrow signal. No acute soft tissue abnormality. Degenerative grade 1 anterolisthesis is present at C4-C5. IMPRESSION: 1. No acute findings. 2. Remote lacunar infarcts in the thalami bilaterally and  right cerebellum. 3. Mildly advanced periventricular and scattered subcortical T2 hyperintensities bilaterally, with extension into the brainstem. Electronically signed by: Lonni Necessary MD 07/30/2024 04:04 PM EST RP Workstation: [id number]     Procedures   Medications Ordered in the ED  lactated ringers  bolus 1,000 mL (0 mLs Intravenous Stopped 08/01/24 1438)  ondansetron  (ZOFRAN ) injection 4 mg (4 mg Intravenous Given 08/01/24 1151)                                    Medical Decision Making Cardiac monitor interpretation: Sinus rhythm, no ectopy  Patient here for nausea and vomiting that started this morning.  Doing much better after Zofran  and IV fluids and she is able to tolerate p.o.  She missed dialysis today.  I discussed with nephrology and they will schedule patient for 1015 tomorrow morning.  Lab work is fairly unremarkable.  I will give her a prescription for Zofran .  She is advised close follow-up with primary care doctor.  All results and plan discussed with patient and family bedside they are agreeable with plan for discharge home  Problems  Addressed: History of end stage renal disease: chronic illness or injury Nausea and vomiting, unspecified vomiting type: acute illness or injury  Amount and/or Complexity of Data Reviewed External Data Reviewed: notes.    Details: Prior ED records reviewed and patient was seen 2 days ago and diagnosed with Bell's palsy Labs: ordered. Decision-making details documented in ED Course.    Details: Ordered and reviewed by me and unremarkable ECG/medicine tests: ordered and independent interpretation performed. Decision-making details documented in ED Course.    Details: Ordered and interpreted by me in the absence of cardiology and shows sinus rhythm, no STEMI or significant change when compared to prior Discussion of management or test interpretation with external provider(s): Dr. Berlin -nephrology-I spoke with her on the phone and she arranged for patient to go to dialysis tomorrow at 10:15 AM  Risk OTC drugs. Prescription drug management. Drug therapy requiring intensive monitoring for toxicity.     Final diagnoses:  Nausea and vomiting, unspecified vomiting type  History of end stage renal disease    ED Discharge Orders          Ordered    ondansetron  (ZOFRAN ) 4 MG tablet  Every 8 hours PRN        08/01/24 1348               Keean Wilmeth, Duwaine L, DO 08/01/24 1538

## 2024-08-01 NOTE — Discharge Instructions (Signed)
 Go to dialysis at 1015 tomorrow morning at your usual site.  You can use Zofran  as needed for nausea and vomiting.  Keep a bland diet for the rest of the day and drink lots of fluids.

## 2024-08-01 NOTE — Progress Notes (Signed)
 Contacted by nephrology, MWF pt at [facility],missed last tx. Clinic stated tated she can receive tx tomorrow on Saturday at 1015am if discharged by then. [name] will be her nurse tomorrow if so. Have informed nephrology and MD via chat. Will continue to assist as needed.   Lindsay Cortez Dialysis Navigator [phone]

## 2024-08-01 NOTE — ED Triage Notes (Signed)
 Pt states she woke up this am vomiting phlegm. Hasn't eaten or drank anything. Dialysis pt M, W, F.. On Wednesday, dialysis sent pt to ED and dx/d with Bell's Palsy. Pt states she ate last night but this am hasn't felt ready to eat Came in bc pt states she was feeling funnyand dizzy like. CBG 188

## 2024-08-14 ENCOUNTER — Ambulatory Visit: Admitting: Physician Assistant

## 2024-08-28 ENCOUNTER — Other Ambulatory Visit (HOSPITAL_COMMUNITY): Payer: Self-pay | Admitting: Internal Medicine

## 2024-08-28 DEATH — deceased

## 2024-11-03 ENCOUNTER — Ambulatory Visit: Admitting: Neurology

## 2025-01-20 ENCOUNTER — Ambulatory Visit (HOSPITAL_COMMUNITY): Admitting: Internal Medicine
